# Patient Record
Sex: Female | Born: 1955
Health system: Southern US, Community
[De-identification: ages and names within clinical notes are randomized; demographics above are authoritative.]

## PROBLEM LIST (undated history)

## (undated) DIAGNOSIS — K5521 Angiodysplasia of colon with hemorrhage: Secondary | ICD-10-CM

## (undated) DIAGNOSIS — F329 Major depressive disorder, single episode, unspecified: Secondary | ICD-10-CM

## (undated) DIAGNOSIS — K589 Irritable bowel syndrome without diarrhea: Secondary | ICD-10-CM

## (undated) DIAGNOSIS — G43909 Migraine, unspecified, not intractable, without status migrainosus: Secondary | ICD-10-CM

## (undated) DIAGNOSIS — G250 Essential tremor: Secondary | ICD-10-CM

## (undated) DIAGNOSIS — R195 Other fecal abnormalities: Secondary | ICD-10-CM

## (undated) DIAGNOSIS — G4730 Sleep apnea, unspecified: Secondary | ICD-10-CM

## (undated) DIAGNOSIS — K635 Polyp of colon: Secondary | ICD-10-CM

## (undated) DIAGNOSIS — M1990 Unspecified osteoarthritis, unspecified site: Secondary | ICD-10-CM

## (undated) DIAGNOSIS — E039 Hypothyroidism, unspecified: Secondary | ICD-10-CM

## (undated) DIAGNOSIS — F419 Anxiety disorder, unspecified: Secondary | ICD-10-CM

## (undated) DIAGNOSIS — T7840XA Allergy, unspecified, initial encounter: Secondary | ICD-10-CM

## (undated) DIAGNOSIS — K219 Gastro-esophageal reflux disease without esophagitis: Secondary | ICD-10-CM

## (undated) DIAGNOSIS — R0902 Hypoxemia: Secondary | ICD-10-CM

## (undated) DIAGNOSIS — E785 Hyperlipidemia, unspecified: Secondary | ICD-10-CM

## (undated) DIAGNOSIS — G4700 Insomnia, unspecified: Secondary | ICD-10-CM

## (undated) DIAGNOSIS — R413 Other amnesia: Secondary | ICD-10-CM

## (undated) DIAGNOSIS — J449 Chronic obstructive pulmonary disease, unspecified: Secondary | ICD-10-CM

## (undated) DIAGNOSIS — N390 Urinary tract infection, site not specified: Secondary | ICD-10-CM

## (undated) DIAGNOSIS — F32A Depression, unspecified: Secondary | ICD-10-CM

## (undated) DIAGNOSIS — I10 Essential (primary) hypertension: Secondary | ICD-10-CM

## (undated) DIAGNOSIS — E669 Obesity, unspecified: Secondary | ICD-10-CM

## (undated) DIAGNOSIS — J439 Emphysema, unspecified: Secondary | ICD-10-CM

## (undated) HISTORY — DX: Insomnia, unspecified: G47.00

## (undated) HISTORY — DX: Allergy, unspecified, initial encounter: T78.40XA

## (undated) HISTORY — DX: Irritable bowel syndrome without diarrhea: K58.9

## (undated) HISTORY — DX: Major depressive disorder, single episode, unspecified: F32.9

## (undated) HISTORY — DX: Polyp of colon: K63.5

## (undated) HISTORY — DX: Hypoxemia: R09.02

## (undated) HISTORY — DX: Chronic obstructive pulmonary disease, unspecified: J44.9

## (undated) HISTORY — DX: Urinary tract infection, site not specified: N39.0

## (undated) HISTORY — DX: Angiodysplasia of colon with hemorrhage: K55.21

## (undated) HISTORY — DX: Other fecal abnormalities: R19.5

## (undated) HISTORY — DX: Other amnesia: R41.3

## (undated) HISTORY — DX: Sleep apnea, unspecified: G47.30

## (undated) HISTORY — PX: OTHER SURGICAL HISTORY: SHX169

## (undated) HISTORY — PX: ANKLE FRACTURE SURGERY: SHX122

## (undated) HISTORY — DX: Essential tremor: G25.0

## (undated) HISTORY — DX: Anxiety disorder, unspecified: F41.9

## (undated) HISTORY — DX: Emphysema, unspecified: J43.9

## (undated) HISTORY — DX: Depression, unspecified: F32.A

## (undated) HISTORY — DX: Migraine, unspecified, not intractable, without status migrainosus: G43.909

## (undated) HISTORY — PX: EYE SURGERY: SHX253

## (undated) HISTORY — DX: Hyperlipidemia, unspecified: E78.5

## (undated) HISTORY — DX: Gastro-esophageal reflux disease without esophagitis: K21.9

## (undated) HISTORY — DX: Essential (primary) hypertension: I10

## (undated) HISTORY — DX: Obesity, unspecified: E66.9

---

## 1999-04-30 ENCOUNTER — Ambulatory Visit (HOSPITAL_COMMUNITY): Admission: RE | Admit: 1999-04-30 | Discharge: 1999-04-30 | Payer: Self-pay | Admitting: Family Medicine

## 1999-04-30 ENCOUNTER — Encounter: Payer: Self-pay | Admitting: Family Medicine

## 1999-12-02 HISTORY — PX: WRIST ARTHROCENTESIS: SUR48

## 2000-02-09 ENCOUNTER — Encounter: Payer: Self-pay | Admitting: Emergency Medicine

## 2000-02-09 ENCOUNTER — Emergency Department (HOSPITAL_COMMUNITY): Admission: EM | Admit: 2000-02-09 | Discharge: 2000-02-09 | Payer: Self-pay | Admitting: Emergency Medicine

## 2000-09-21 ENCOUNTER — Encounter: Payer: Self-pay | Admitting: Family Medicine

## 2000-09-21 ENCOUNTER — Encounter: Admission: RE | Admit: 2000-09-21 | Discharge: 2000-09-21 | Payer: Self-pay | Admitting: Family Medicine

## 2001-07-07 ENCOUNTER — Other Ambulatory Visit: Admission: RE | Admit: 2001-07-07 | Discharge: 2001-07-07 | Payer: Self-pay | Admitting: Obstetrics and Gynecology

## 2002-09-26 ENCOUNTER — Other Ambulatory Visit: Admission: RE | Admit: 2002-09-26 | Discharge: 2002-09-26 | Payer: Self-pay | Admitting: Obstetrics and Gynecology

## 2005-04-11 ENCOUNTER — Ambulatory Visit: Payer: Self-pay | Admitting: Family Medicine

## 2005-04-16 ENCOUNTER — Ambulatory Visit: Payer: Self-pay | Admitting: Family Medicine

## 2005-08-01 ENCOUNTER — Ambulatory Visit: Payer: Self-pay | Admitting: Family Medicine

## 2005-08-03 ENCOUNTER — Encounter: Admission: RE | Admit: 2005-08-03 | Discharge: 2005-08-03 | Payer: Self-pay | Admitting: Family Medicine

## 2006-02-24 ENCOUNTER — Ambulatory Visit: Payer: Self-pay | Admitting: Family Medicine

## 2006-10-13 ENCOUNTER — Ambulatory Visit: Payer: Self-pay | Admitting: Family Medicine

## 2007-05-16 ENCOUNTER — Emergency Department (HOSPITAL_COMMUNITY): Admission: EM | Admit: 2007-05-16 | Discharge: 2007-05-16 | Payer: Self-pay | Admitting: Emergency Medicine

## 2007-05-24 ENCOUNTER — Ambulatory Visit: Payer: Self-pay | Admitting: Family Medicine

## 2007-09-01 ENCOUNTER — Telehealth: Payer: Self-pay | Admitting: Family Medicine

## 2007-11-03 ENCOUNTER — Ambulatory Visit: Payer: Self-pay | Admitting: Family Medicine

## 2007-11-03 DIAGNOSIS — R1013 Epigastric pain: Secondary | ICD-10-CM | POA: Insufficient documentation

## 2007-11-03 DIAGNOSIS — K219 Gastro-esophageal reflux disease without esophagitis: Secondary | ICD-10-CM

## 2007-11-03 DIAGNOSIS — R519 Headache, unspecified: Secondary | ICD-10-CM

## 2007-11-03 DIAGNOSIS — R51 Headache: Secondary | ICD-10-CM

## 2007-11-03 HISTORY — DX: Headache, unspecified: R51.9

## 2007-11-03 HISTORY — DX: Gastro-esophageal reflux disease without esophagitis: K21.9

## 2007-11-12 ENCOUNTER — Telehealth (INDEPENDENT_AMBULATORY_CARE_PROVIDER_SITE_OTHER): Payer: Self-pay | Admitting: *Deleted

## 2007-11-30 ENCOUNTER — Encounter: Payer: Self-pay | Admitting: Family Medicine

## 2008-03-16 ENCOUNTER — Telehealth: Payer: Self-pay | Admitting: Family Medicine

## 2008-07-31 ENCOUNTER — Telehealth: Payer: Self-pay | Admitting: Family Medicine

## 2008-10-05 ENCOUNTER — Telehealth: Payer: Self-pay | Admitting: Family Medicine

## 2008-10-16 ENCOUNTER — Telehealth: Payer: Self-pay | Admitting: Family Medicine

## 2009-03-03 ENCOUNTER — Ambulatory Visit: Payer: Self-pay | Admitting: Family Medicine

## 2009-03-03 DIAGNOSIS — F17200 Nicotine dependence, unspecified, uncomplicated: Secondary | ICD-10-CM | POA: Insufficient documentation

## 2009-03-26 ENCOUNTER — Ambulatory Visit: Payer: Self-pay | Admitting: Family Medicine

## 2009-03-28 ENCOUNTER — Telehealth: Payer: Self-pay | Admitting: Family Medicine

## 2009-04-19 ENCOUNTER — Telehealth: Payer: Self-pay | Admitting: Family Medicine

## 2009-05-07 ENCOUNTER — Telehealth: Payer: Self-pay | Admitting: Family Medicine

## 2009-06-12 ENCOUNTER — Telehealth: Payer: Self-pay | Admitting: Family Medicine

## 2009-07-13 ENCOUNTER — Ambulatory Visit: Payer: Self-pay | Admitting: Family Medicine

## 2009-07-13 DIAGNOSIS — K589 Irritable bowel syndrome without diarrhea: Secondary | ICD-10-CM | POA: Insufficient documentation

## 2009-07-13 HISTORY — DX: Irritable bowel syndrome, unspecified: K58.9

## 2009-07-16 ENCOUNTER — Ambulatory Visit: Payer: Self-pay | Admitting: Family Medicine

## 2009-07-16 LAB — CONVERTED CEMR LAB
Bilirubin Urine: NEGATIVE
Blood in Urine, dipstick: NEGATIVE
Glucose, Urine, Semiquant: NEGATIVE
Ketones, urine, test strip: NEGATIVE
Nitrite: NEGATIVE
Protein, U semiquant: NEGATIVE
Specific Gravity, Urine: 1.025
Urobilinogen, UA: 0.2
pH: 5.5

## 2009-07-19 LAB — CONVERTED CEMR LAB
ALT: 105 units/L — ABNORMAL HIGH (ref 0–35)
AST: 78 units/L — ABNORMAL HIGH (ref 0–37)
Albumin: 4.2 g/dL (ref 3.5–5.2)
Alkaline Phosphatase: 91 units/L (ref 39–117)
BUN: 13 mg/dL (ref 6–23)
Basophils Absolute: 0 10*3/uL (ref 0.0–0.1)
Basophils Relative: 0.5 % (ref 0.0–3.0)
Bilirubin, Direct: 0 mg/dL (ref 0.0–0.3)
CO2: 28 meq/L (ref 19–32)
Calcium: 10 mg/dL (ref 8.4–10.5)
Chloride: 99 meq/L (ref 96–112)
Cholesterol: 264 mg/dL — ABNORMAL HIGH (ref 0–200)
Creatinine, Ser: 0.6 mg/dL (ref 0.4–1.2)
Direct LDL: 199.1 mg/dL
Eosinophils Absolute: 0.4 10*3/uL (ref 0.0–0.7)
Eosinophils Relative: 4.3 % (ref 0.0–5.0)
GFR calc non Af Amer: 111.05 mL/min (ref >60)
Glucose, Bld: 279 mg/dL — ABNORMAL HIGH (ref 70–99)
HCT: 41.1 % (ref 36.0–46.0)
HDL: 28.8 mg/dL — ABNORMAL LOW (ref >39.00)
Hemoglobin: 13.8 g/dL (ref 12.0–15.0)
Lymphocytes Relative: 34.1 % (ref 12.0–46.0)
Lymphs Abs: 3.4 10*3/uL (ref 0.7–4.0)
MCHC: 33.5 g/dL (ref 30.0–36.0)
MCV: 92.5 fL (ref 78.0–100.0)
Monocytes Absolute: 0.6 10*3/uL (ref 0.1–1.0)
Monocytes Relative: 5.8 % (ref 3.0–12.0)
Neutro Abs: 5.5 10*3/uL (ref 1.4–7.7)
Neutrophils Relative %: 55.3 % (ref 43.0–77.0)
Platelets: 253 10*3/uL (ref 150.0–400.0)
Potassium: 4.3 meq/L (ref 3.5–5.1)
RBC: 4.44 M/uL (ref 3.87–5.11)
RDW: 12.2 % (ref 11.5–14.6)
Sodium: 135 meq/L (ref 135–145)
TSH: 2.89 microintl units/mL (ref 0.35–5.50)
Total Bilirubin: 0.7 mg/dL (ref 0.3–1.2)
Total CHOL/HDL Ratio: 9
Total Protein: 7.9 g/dL (ref 6.0–8.3)
Triglycerides: 247 mg/dL — ABNORMAL HIGH (ref 0.0–149.0)
VLDL: 49.4 mg/dL — ABNORMAL HIGH (ref 0.0–40.0)
WBC: 9.9 10*3/uL (ref 4.5–10.5)

## 2009-07-20 ENCOUNTER — Ambulatory Visit: Payer: Self-pay | Admitting: Family Medicine

## 2009-07-20 DIAGNOSIS — E782 Mixed hyperlipidemia: Secondary | ICD-10-CM

## 2009-07-20 DIAGNOSIS — J309 Allergic rhinitis, unspecified: Secondary | ICD-10-CM

## 2009-07-20 HISTORY — DX: Allergic rhinitis, unspecified: J30.9

## 2009-07-20 HISTORY — DX: Mixed hyperlipidemia: E78.2

## 2009-08-03 ENCOUNTER — Ambulatory Visit: Payer: Self-pay | Admitting: Family Medicine

## 2009-09-24 ENCOUNTER — Ambulatory Visit: Payer: Self-pay | Admitting: Family Medicine

## 2009-11-16 ENCOUNTER — Encounter: Payer: Self-pay | Admitting: Family Medicine

## 2009-11-16 ENCOUNTER — Telehealth: Payer: Self-pay | Admitting: Family Medicine

## 2009-11-19 ENCOUNTER — Telehealth: Payer: Self-pay | Admitting: Family Medicine

## 2009-11-19 ENCOUNTER — Ambulatory Visit: Payer: Self-pay | Admitting: Family Medicine

## 2010-01-30 ENCOUNTER — Telehealth: Payer: Self-pay | Admitting: Family Medicine

## 2010-04-08 ENCOUNTER — Telehealth: Payer: Self-pay | Admitting: Family Medicine

## 2010-05-20 ENCOUNTER — Telehealth: Payer: Self-pay | Admitting: Family Medicine

## 2010-05-29 ENCOUNTER — Ambulatory Visit: Payer: Self-pay | Admitting: Family Medicine

## 2010-08-15 ENCOUNTER — Telehealth: Payer: Self-pay | Admitting: Family Medicine

## 2010-09-18 ENCOUNTER — Ambulatory Visit: Payer: Self-pay | Admitting: Family Medicine

## 2010-09-18 LAB — CONVERTED CEMR LAB
Bilirubin Urine: NEGATIVE
Blood in Urine, dipstick: NEGATIVE
Glucose, Urine, Semiquant: NEGATIVE
Ketones, urine, test strip: NEGATIVE
Nitrite: NEGATIVE
Protein, U semiquant: NEGATIVE
Specific Gravity, Urine: 1.025
Urobilinogen, UA: 0.2
pH: 5

## 2010-09-20 LAB — CONVERTED CEMR LAB
ALT: 82 units/L — ABNORMAL HIGH (ref 0–35)
Albumin: 4.1 g/dL (ref 3.5–5.2)
BUN: 12 mg/dL (ref 6–23)
Bilirubin, Direct: 0.1 mg/dL (ref 0.0–0.3)
Calcium: 9.8 mg/dL (ref 8.4–10.5)
Cholesterol: 217 mg/dL — ABNORMAL HIGH (ref 0–200)
Creatinine, Ser: 0.7 mg/dL (ref 0.4–1.2)
Direct LDL: 146.2 mg/dL
GFR calc non Af Amer: 99.05 mL/min (ref >60)
Glucose, Bld: 207 mg/dL — ABNORMAL HIGH (ref 70–99)
HDL: 35.8 mg/dL — ABNORMAL LOW (ref >39.00)
Hgb A1c MFr Bld: 8.5 % — ABNORMAL HIGH (ref 4.6–6.5)
Lymphocytes Relative: 29.6 % (ref 12.0–46.0)
MCHC: 33.3 g/dL (ref 30.0–36.0)
Monocytes Absolute: 0.6 10*3/uL (ref 0.1–1.0)
Monocytes Relative: 5.5 % (ref 3.0–12.0)
Platelets: 248 10*3/uL (ref 150.0–400.0)
RDW: 14.6 % (ref 11.5–14.6)
Total Protein: 7.3 g/dL (ref 6.0–8.3)
Triglycerides: 263 mg/dL — ABNORMAL HIGH (ref 0.0–149.0)

## 2010-10-01 ENCOUNTER — Other Ambulatory Visit: Admission: RE | Admit: 2010-10-01 | Discharge: 2010-10-01 | Payer: Self-pay | Admitting: Family Medicine

## 2010-10-01 ENCOUNTER — Encounter: Payer: Self-pay | Admitting: Family Medicine

## 2010-10-01 ENCOUNTER — Ambulatory Visit: Payer: Self-pay | Admitting: Family Medicine

## 2010-10-01 DIAGNOSIS — E039 Hypothyroidism, unspecified: Secondary | ICD-10-CM | POA: Insufficient documentation

## 2010-10-01 HISTORY — DX: Hypothyroidism, unspecified: E03.9

## 2010-10-01 LAB — HM PAP SMEAR

## 2010-10-07 ENCOUNTER — Encounter: Payer: Self-pay | Admitting: Family Medicine

## 2010-10-09 ENCOUNTER — Ambulatory Visit: Payer: Self-pay | Admitting: Family Medicine

## 2010-10-18 ENCOUNTER — Telehealth: Payer: Self-pay | Admitting: Family Medicine

## 2010-12-03 ENCOUNTER — Telehealth: Payer: Self-pay | Admitting: Family Medicine

## 2010-12-11 ENCOUNTER — Telehealth: Payer: Self-pay | Admitting: Family Medicine

## 2010-12-13 ENCOUNTER — Ambulatory Visit
Admission: RE | Admit: 2010-12-13 | Discharge: 2010-12-13 | Payer: Self-pay | Source: Home / Self Care | Attending: Family Medicine | Admitting: Family Medicine

## 2010-12-13 DIAGNOSIS — L301 Dyshidrosis [pompholyx]: Secondary | ICD-10-CM

## 2010-12-13 HISTORY — DX: Dyshidrosis (pompholyx): L30.1

## 2011-01-02 NOTE — Assessment & Plan Note (Signed)
Summary: dry skin and cracking?/dm   Vital Signs:  Patient profile:   55 year old female Weight:      188 pounds O2 Sat:      95 % Temp:     98.2 degrees F Pulse rate:   95 / minute BP sitting:   126 / 84  (left arm) Cuff size:   regular  Vitals Entered By: Pura Spice, RN (December 13, 2010 3:53 PM) CC: hand and feet peeling cracking . states better today ck place on left foot not healing Is Patient Diabetic? Yes Did you bring your meter with you today? No   History of Present Illness: Here for dry,  cracking,  irritated skin on the hands and feet for 2 months. Tried antifungal creams with no help.   Allergies: 1)  ! Vicodin 2)  ! Codeine 3)  ! Penicillin 4)  ! Prozac 5)  ! Percocet 6)  ! Tramadol Hcl  Past History:  Past Medical History: Reviewed history from 10/01/2010 and no changes required. Depression GERD Headache IBS Anxiety insomnia Allergic rhinitis Diabetes mellitus, type II Hypothyroidism  Review of Systems  The patient denies anorexia, fever, weight loss, weight gain, vision loss, decreased hearing, hoarseness, chest pain, syncope, dyspnea on exertion, peripheral edema, prolonged cough, headaches, hemoptysis, abdominal pain, melena, hematochezia, severe indigestion/heartburn, hematuria, incontinence, genital sores, muscle weakness, suspicious skin lesions, transient blindness, difficulty walking, depression, unusual weight change, abnormal bleeding, enlarged lymph nodes, angioedema, breast masses, and testicular masses.    Physical Exam  General:  Well-developed,well-nourished,in no acute distress; alert,appropriate and cooperative throughout examination Skin:  the hands and feet have widespread areas of macular erythema, scaling, and have some fissuring.    Impression & Recommendations:  Problem # 1:  DYSHIDROTIC ECZEMA (ICD-705.81)  Complete Medication List: 1)  Nexium 40 Mg Cpdr (Esomeprazole magnesium) .Marland Kitchen.. 1 by mouth once daily 2)   Sertraline Hcl 50 Mg Tabs (Sertraline hcl) .Marland Kitchen.. 1 by mouth once daily 3)  Xanax 0.5 Mg Tabs (Alprazolam) .... Three times a day as needed 4)  Temazepam 30 Mg Caps (Temazepam) .Marland Kitchen.. 1 at bedtime prn 5)  Hydroxyzine Hcl 25 Mg Tabs (Hydroxyzine hcl) .Marland Kitchen.. 1 by mouth every 4 hours as needed for itching 6)  Triamcinolone Acetonide 0.1 % Crea (Triamcinolone acetonide) .... Apply bid to affected area as needed 7)  Miralax Powd (Polyethylene glycol 3350) .Marland Kitchen.. 17g by mouth once daily as needed constipation 8)  Ibuprofen 600 Mg Tabs (Ibuprofen) .... As needed 9)  Align Caps (Probiotic product) .... Once daily 10)  Glyburide-metformin 5-500 Mg Tabs (Glyburide-metformin) .... 2 tabs two times a day 11)  Zocor 40 Mg Tabs (Simvastatin) .... Once daily 12)  Tessalon Perles 100 Mg Caps (Benzonatate) .Marland Kitchen.. 1 q 4 hours as needed cough 13)  Januvia 100 Mg Tabs (Sitagliptin phosphate) .... Once daily 14)  Ra Truetest Test Strp (Glucose blood) .... Uad 15)  Synthroid 50 Mcg Tabs (Levothyroxine sodium) .Marland Kitchen.. 1 by mouth once daily 16)  Ultravate 0.05 % Crea (Halobetasol propionate) .... Apply three times a day as needed  Patient Instructions: 1)  try Halobetasol cream.  2)  Please schedule a follow-up appointment as needed .  Prescriptions: ULTRAVATE 0.05 % CREA (HALOBETASOL PROPIONATE) apply three times a day as needed  #60 x 5   Entered and Authorized by:   Nelwyn Salisbury MD   Signed by:   Nelwyn Salisbury MD on 12/13/2010   Method used:   [facility] [id number]       [address]       [place], [place]  [address]       Ph: [phone] or [phone]       Fax: [phone]   RxID:   [id number]    Orders Added: 1)  Est. Patient Level III [38756]

## 2011-01-02 NOTE — Progress Notes (Signed)
Summary: Pt req work in ov with Dr. Clent Ridges on 12/12/10  Phone Note Call from Patient Call back at Home Phone [phone]   Caller: Patient Summary of Call: Pt called and said that the skin on her hand are peeling and can not get it to heal. Pt req work in appt with Dr. Clent Ridges on 12/12/10. Pls advise.  Initial call taken by: Lucy Antigua,  December 11, 2010 3:36 PM  Follow-up for Phone Call        use a good moisturizer like Eucerin today, and I can see her tomorrow  Follow-up by: Nelwyn Salisbury MD,  December 12, 2010 10:55 AM  Additional Follow-up for Phone Call Additional follow up Details #1::        Pt notified and appt scheduled. Additional Follow-up by: Lynann Beaver CMA AAMA,  December 12, 2010 1:28 PM

## 2011-01-02 NOTE — Assessment & Plan Note (Signed)
Summary: HYPERGLYCEMIA CONCERNS // RS   Vital Signs:  Patient profile:   55 year old female Weight:      189 pounds BMI:     37.04 BP sitting:   122 / 70  (left arm) Cuff size:   regular  Vitals Entered By: Raechel Ache, RN (Leilani 29, 2011 4:38 PM) CC: C/o high BS's and feet hurt and stay cold. Has 3 skin tags.   History of Present Illness: Here with concern over elevated glucose readings at home over the past 6 months. Despite exercising and watching her diet closely, her morning fasting glucoses are running from 150-200. She takes her meds regularly. She feels good in general.   Allergies: 1)  ! Vicodin 2)  ! Codeine 3)  ! Penicillin 4)  ! Prozac 5)  ! Percocet 6)  ! Tramadol Hcl  Past History:  Past Medical History: Depression GERD Headache IBS Anxiety insomnia Allergic rhinitis sees Debbora Dus NP for GYN exams Diabetes mellitus, type II  Past Surgical History: Reviewed history from 11/03/2007 and no changes required. Benign tumor removed from groin  Review of Systems  The patient denies anorexia, fever, weight loss, weight gain, vision loss, decreased hearing, hoarseness, chest pain, syncope, dyspnea on exertion, peripheral edema, prolonged cough, headaches, hemoptysis, abdominal pain, melena, hematochezia, severe indigestion/heartburn, hematuria, incontinence, genital sores, muscle weakness, suspicious skin lesions, transient blindness, difficulty walking, depression, unusual weight change, abnormal bleeding, enlarged lymph nodes, angioedema, breast masses, and testicular masses.    Physical Exam  General:  overweight-appearing.   Neck:  No deformities, masses, or tenderness noted. Lungs:  Normal respiratory effort, chest expands symmetrically. Lungs are clear to auscultation, no crackles or wheezes. Heart:  Normal rate and regular rhythm. S1 and S2 normal without gallop, murmur, click, rub or other extra sounds.   Impression & Recommendations:  Problem #  1:  DIABETES MELLITUS, TYPE II (ICD-250.00)  Her updated medication list for this problem includes:    Glyburide-metformin 5-500 Mg Tabs (Glyburide-metformin) .Marland Kitchen... 2 tabs two times a day    Januvia 100 Mg Tabs (Sitagliptin phosphate) ..... Once daily  Problem # 2:  HYPERLIPIDEMIA, MIXED (ICD-272.2)  Her updated medication list for this problem includes:    Zocor 40 Mg Tabs (Simvastatin) ..... Once daily  Complete Medication List: 1)  Nexium 40 Mg Cpdr (Esomeprazole magnesium) .Marland Kitchen.. 1 by mouth once daily 2)  Sertraline Hcl 50 Mg Tabs (Sertraline hcl) .Marland Kitchen.. 1 by mouth once daily 3)  Xanax 0.5 Mg Tabs (Alprazolam) .... Three times a day as needed 4)  Temazepam 30 Mg Caps (Temazepam) .Marland Kitchen.. 1 at bedtime prn 5)  Hydroxyzine Hcl 25 Mg Tabs (Hydroxyzine hcl) .Marland Kitchen.. 1 by mouth every 4 hours as needed for itching 6)  Triamcinolone Acetonide 0.1 % Crea (Triamcinolone acetonide) .... Apply bid to affected area as needed 7)  Miralax Powd (Polyethylene glycol 3350) .Marland Kitchen.. 17g by mouth once daily as needed constipation 8)  Ibuprofen 600 Mg Tabs (Ibuprofen) .... As needed 9)  Align Caps (Probiotic product) .... Once daily 10)  Glyburide-metformin 5-500 Mg Tabs (Glyburide-metformin) .... 2 tabs two times a day 11)  Zocor 40 Mg Tabs (Simvastatin) .... Once daily 12)  Tessalon Perles 100 Mg Caps (Benzonatate) .Marland Kitchen.. 1 q 4 hours as needed cough 13)  Januvia 100 Mg Tabs (Sitagliptin phosphate) .... Once daily  Patient Instructions: 1)  Add Januvia to her daily regimen. Recheck in 2 months with fasting labs and a cpx.

## 2011-01-02 NOTE — Progress Notes (Signed)
Summary: cold rx  Phone Note Call from Patient   Caller: Patient Call For: Nelwyn Salisbury MD Reason for Call: Talk to Doctor Summary of Call: patient is calling because she has a cold- productive- green.  She would like [facility].  She also says that z-pak does not work for her. Initial call taken by: Kern Reap CMA Duncan Dull),  December 03, 2010 10:43 AM  Follow-up for Phone Call        pt aware.  med called  Follow-up by: Pura Spice, RN,  December 03, 2010 1:41 PM    New/Updated Medications: DOXYCYCLINE HYCLATE 100 MG CAPS (DOXYCYCLINE HYCLATE) Take 1 tab twice a day Prescriptions: DOXYCYCLINE HYCLATE 100 MG CAPS (DOXYCYCLINE HYCLATE) Take 1 tab twice a day  #20 x 0   Entered by:   Pura Spice, RN   Authorized by:   Nelwyn Salisbury MD   Signed by:   Pura Spice, RN on 12/03/2010   Method used:   [facility]        [facility]  [place] [facility]       [address]       [place], [place]  [address]       Ph: [phone] or [phone]       Fax: [phone]   RxID:   [id number]   Appended Document: cold rx per dr Alfonzo Feller call in doxycycline 100mg  #20  1 by mouth two times a day

## 2011-01-02 NOTE — Progress Notes (Signed)
Summary: refill doxycycline  Phone Note From Pharmacy   [phone][facility] Call For: Bonnie Gonzalez  Summary of Call: pt wants a refill on Doxycycline 100mg  1 by mouth two times a day Initial call taken by: Alfred Levins, CMA,  January 30, 2010 4:10 PM  Follow-up for Phone Call        call in another 10 day supply Follow-up by: Nelwyn Salisbury MD,  January 30, 2010 5:22 PM  Additional Follow-up for Phone Call Additional follow up Details #1::        Phone call completed, Pharmacist called Additional Follow-up by: Alfred Levins, CMA,  January 30, 2010 5:23 PM    New/Updated Medications: DOXYCYCLINE HYCLATE 100 MG CAPS (DOXYCYCLINE HYCLATE) Take 1 tab twice a day Prescriptions: DOXYCYCLINE HYCLATE 100 MG CAPS (DOXYCYCLINE HYCLATE) Take 1 tab twice a day  #20 x 0   Entered by:   Alfred Levins, CMA   Authorized by:   Nelwyn Salisbury MD   Signed by:   Alfred Levins, CMA on 01/30/2010   Method used:   [facility] to        [id number] (retail[facility]       [address] [place], [place]  [address]       Ph: [phone]       Fax: [phone]   RxID:   [id number]

## 2011-01-02 NOTE — Miscellaneous (Signed)
Summary: Rremoval of skin tags/[facility]  Rremoval of skin tags/[facility]   Imported By: Sherian Rein 10/15/2010 11:57:38  _____________________________________________________________________  External Attachment:    Type:   Image     Comment:   External Document

## 2011-01-02 NOTE — Letter (Signed)
Summary: Results Follow-up Letter  [place] at [facility]  [address] [place], [place] [address]   Phone: [phone]  Fax: [phone]    10/07/2010  [address], [address] [place], [place]  [address]  Dear Ms. Debell,   The following are the results of your recent test(s):  Test     Result     Pap Smear    Normal____ YES REPEAT 1 YR ___   _______________________________________________________  Dr Almon Hercules, [place] at [facility]

## 2011-01-02 NOTE — Progress Notes (Signed)
Summary: refill Temazepam  Phone Note Refill Request Message from:  Fax from Pharmacy on Apr 08, 2010 1:16 PM  Refills Requested: Medication #1:  TEMAZEPAM 30 MG CAPS 1 at bedtime prn   Dosage confirmed as above?Dosage Confirmed   Supply Requested: 1 month   Last Refilled: 12/01/2009  Method Requested: Telephone to Pharmacy Initial call taken by: Raechel Ache, RN,  Apr 08, 2010 1:17 PM [phone]  Follow-up for Phone Call        call in #30 with 5 rf Follow-up by: Nelwyn Salisbury MD,  Apr 08, 2010 2:23 PM  Additional Follow-up for Phone Call Additional follow up Details #1::        Rx faxed to pharmacy Additional Follow-up by: Raechel Ache, RN,  Apr 08, 2010 2:45 PM    Prescriptions: TEMAZEPAM 30 MG CAPS (TEMAZEPAM) 1 at bedtime prn  #30 x 5   Entered by:   Raechel Ache, RN   Authorized by:   Nelwyn Salisbury MD   Signed by:   Raechel Ache, RN on 04/08/2010   Method used:   Historical   RxID:   [id number]

## 2011-01-02 NOTE — Assessment & Plan Note (Signed)
Summary: cpx/pt req skin tag removal/cjr   Vital Signs:  Patient profile:   55 year old female Height:      60 inches Weight:      188 pounds O2 Sat:      97 % Temp:     98.4 degrees F Pulse rate:   90 / minute BP sitting:   130 / 86  (left arm)  Vitals Entered By: Pura Spice, RN (October 01, 2010 1:50 PM) CC: cpx w/pap c/o BS up and fatigue    History of Present Illness: 55 yr old female for a cpx. She feels well but admits to getting no exercise and eating poorly. Her glucoses range in the 100s and 200s. Her last Pap smear  and mammogram were 3 years ago.   Allergies: 1)  ! Vicodin 2)  ! Codeine 3)  ! Penicillin 4)  ! Prozac 5)  ! Percocet 6)  ! Tramadol Hcl  Past History:  Past Medical History: Depression GERD Headache IBS Anxiety insomnia Allergic rhinitis Diabetes mellitus, type II Hypothyroidism  Past Surgical History: Reviewed history from 11/03/2007 and no changes required. Benign tumor removed from groin  Family History: Reviewed history from 11/03/2007 and no changes required. Family History of Asthma Family History Depression Family History Diabetes 1st degree relative Parkinson's diease Lupus  Social History: Reviewed history from 11/03/2007 and no changes required. Divorced Current Smoker Alcohol use-yes Drug use-yes  Review of Systems  The patient denies anorexia, fever, weight loss, vision loss, decreased hearing, hoarseness, chest pain, syncope, dyspnea on exertion, peripheral edema, prolonged cough, headaches, hemoptysis, abdominal pain, melena, hematochezia, severe indigestion/heartburn, hematuria, incontinence, genital sores, muscle weakness, suspicious skin lesions, transient blindness, difficulty walking, depression, unusual weight change, abnormal bleeding, enlarged lymph nodes, angioedema, breast masses, and testicular masses.    Physical Exam  General:  overweight-appearing.   Head:  Normocephalic and atraumatic without  obvious abnormalities. No apparent alopecia or balding. Eyes:  No corneal or conjunctival inflammation noted. EOMI. Perrla. Funduscopic exam benign, without hemorrhages, exudates or papilledema. Vision grossly normal. Ears:  External ear exam shows no significant lesions or deformities.  Otoscopic examination reveals clear canals, tympanic membranes are intact bilaterally without bulging, retraction, inflammation or discharge. Hearing is grossly normal bilaterally. Nose:  External nasal examination shows no deformity or inflammation. Nasal mucosa are pink and moist without lesions or exudates. Mouth:  Oral mucosa and oropharynx without lesions or exudates.  Teeth in good repair. Neck:  No deformities, masses, or tenderness noted. Chest Wall:  No deformities, masses, or tenderness noted. Breasts:  No mass, nodules, thickening, tenderness, bulging, retraction, inflamation, nipple discharge or skin changes noted.   Lungs:  Normal respiratory effort, chest expands symmetrically. Lungs are clear to auscultation, no crackles or wheezes. Heart:  Normal rate and regular rhythm. S1 and S2 normal without gallop, murmur, click, rub or other extra sounds. EKG normal Abdomen:  Bowel sounds positive,abdomen soft and non-tender without masses, organomegaly or hernias noted. Rectal:  No external abnormalities noted. Normal sphincter tone. No rectal masses or tenderness. Heme neg. Genitalia:  Pelvic Exam:        External: normal female genitalia without lesions or masses        Vagina: normal without lesions or masses        Cervix: normal without lesions or masses        Adnexa: normal bimanual exam without masses or fullness        Uterus: normal by palpation  Pap smear: performed Msk:  No deformity or scoliosis noted of thoracic or lumbar spine.   Pulses:  R and L carotid,radial,femoral,dorsalis pedis and posterior tibial pulses are full and equal bilaterally Extremities:  No clubbing, cyanosis, edema,  or deformity noted with normal full range of motion of all joints.   Neurologic:  No cranial nerve deficits noted. Station and gait are normal. Plantar reflexes are down-going bilaterally. DTRs are symmetrical throughout. Sensory, motor and coordinative functions appear intact. Skin:  Intact without suspicious lesions or rashes Cervical Nodes:  No lymphadenopathy noted Axillary Nodes:  No palpable lymphadenopathy Inguinal Nodes:  No significant adenopathy Psych:  Cognition and judgment appear intact. Alert and cooperative with normal attention span and concentration. No apparent delusions, illusions, hallucinations   Impression & Recommendations:  Problem # 1:  HEALTH MAINTENANCE EXAM (ICD-V70.0)  Orders: Hemoccult Guaiac-1 spec.(in office) (82270) EKG w/ Interpretation (93000)  Complete Medication List: 1)  Nexium 40 Mg Cpdr (Esomeprazole magnesium) .Marland Kitchen.. 1 by mouth once daily 2)  Sertraline Hcl 50 Mg Tabs (Sertraline hcl) .Marland Kitchen.. 1 by mouth once daily 3)  Xanax 0.5 Mg Tabs (Alprazolam) .... Three times a day as needed 4)  Temazepam 30 Mg Caps (Temazepam) .Marland Kitchen.. 1 at bedtime prn 5)  Hydroxyzine Hcl 25 Mg Tabs (Hydroxyzine hcl) .Marland Kitchen.. 1 by mouth every 4 hours as needed for itching 6)  Triamcinolone Acetonide 0.1 % Crea (Triamcinolone acetonide) .... Apply bid to affected area as needed 7)  Miralax Powd (Polyethylene glycol 3350) .Marland Kitchen.. 17g by mouth once daily as needed constipation 8)  Ibuprofen 600 Mg Tabs (Ibuprofen) .... As needed 9)  Align Caps (Probiotic product) .... Once daily 10)  Glyburide-metformin 5-500 Mg Tabs (Glyburide-metformin) .... 2 tabs two times a day 11)  Zocor 40 Mg Tabs (Simvastatin) .... Once daily 12)  Tessalon Perles 100 Mg Caps (Benzonatate) .Marland Kitchen.. 1 q 4 hours as needed cough 13)  Januvia 100 Mg Tabs (Sitagliptin phosphate) .... Once daily 14)  Ra Truetest Test Strp (Glucose blood) .... Uad 15)  Synthroid 50 Mcg Tabs (Levothyroxine sodium) .Marland Kitchen.. 1 by mouth once  daily  Other Orders: Tdap => 64yrs IM ([address]) Admin 1st Vaccine ([address]454) Diabetic Clinic Referral (Diabetic)  Patient Instructions: 1)  It is important that you exercise reguarly at least 20 minutes 5 times a week. If you develop chest pain, have severe difficulty breathing, or feel very tired, stop exercising immediately and seek medical attention.  2)  You need to lose weight. Consider a lower calorie diet and regular exercise. Refer to Nutrition  3)  Schedule your mammogram.  4)  Please schedule a follow-up appointment in 3 months .  Prescriptions: ZOCOR 40 MG TABS (SIMVASTATIN) once daily  #30 x 11   Entered and Authorized by:   Nelwyn Salisbury MD   Signed by:   Nelwyn Salisbury MD on 10/01/2010   Method used:   [facility]ronically to        [facility]  [place] (retail)       [address]       [place], [place]  [address]       Ph: [phone] or [phone]       Fax: [phone]   RxID:   [id number] GLYBURIDE-METFORMIN 5-500 MG TABS (GLYBURIDE-METFORMIN) 2 tabs two times a day  #120 x 11   Entered and Authorized by:   Nelwyn Salisbury MD   Signed by:   Nelwyn Salisbury MD on 10/01/2010   Method used:  [facility]ically to        [facility]  [place] (retail)       [address]       [place], [place]  [address]       Ph: [phone] or [phone]       Fax: [phone]   RxID:   [id number] SERTRALINE HCL 50 MG TABS (SERTRALINE HCL) 1 by mouth once daily  #30 x 11   Entered and Authorized by:   Nelwyn Salisbury MD   Signed by:   Nelwyn Salisbury MD on 10/01/2010   Method used:   [facility]        [facility]  [place] [id number]ail[facility]       [address]       [place], [place]  [address]       Ph: [phone] or [phone]       Fax: [phone]   RxID:   [id number]    Orders Added: 1)  Tdap => 22yrs IM [[address]15] 2)  Admin 1st Vaccine [90471] 3)  Est. Patient 40-64 years [[address]] 4)  Hemoccult Guaiac-1 spec.(in office) [82270] 5)  EKG w/  Interpretation [93000] 6)  Diabetic Clinic Referral [Diabetic]   Immunizations Administered:  Tetanus Vaccine:    Vaccine Type: Tdap    Site: left deltoid    Mfr: GlaxoSmithKline    Dose: 0.5 ml    Route: IM    Given by: Pura Spice, RN    Exp. Date: 09/19/2012    Lot #: [id number]    VIS given: 10/18/08 version given October 01, 2010.   Immunizations Administered:  Tetanus Vaccine:    Vaccine Type: Tdap    Site: left deltoid    Mfr: GlaxoSmithKline    Dose: 0.5 ml    Route: IM    Given by: Pura Spice, RN    Exp. Date: 09/19/2012    Lot #: [id number]    VIS given: 10/18/08 version given October 01, 2010.

## 2011-01-02 NOTE — Assessment & Plan Note (Signed)
Summary: skin tags removal/pt has 3 that need to be removed/cjr   History of Present Illness: Here to have several skin lesions removed. These rub against her clothing and become quite irritated and tender. She has 2 on the right neck, one in the right axilla, and one on the right lower abdomen.   Allergies: 1)  ! Vicodin 2)  ! Codeine 3)  ! Penicillin 4)  ! Prozac 5)  ! Percocet 6)  ! Tramadol Hcl  Past History:  Past Medical History: Reviewed history from 10/01/2010 and no changes required. Depression GERD Headache IBS Anxiety insomnia Allergic rhinitis Diabetes mellitus, type II Hypothyroidism  Past Surgical History: Reviewed history from 11/03/2007 and no changes required. Benign tumor removed from groin  Physical Exam  General:  Well-developed,well-nourished,in no acute distress; alert,appropriate and cooperative throughout examination Skin:  2 small skin tags on the right neck, there is a papular lesion in the right axilla measuring 0.8 cm in diameter, and there is a papular lesion on the right lower abdomen measuring 1.3 cm in diameter    Impression & Recommendations:  Problem # 1:  SKIN LESION (ICD-709.9)  Orders: Excise lesion (TAL) 0.6 - 1.0 (11401) Excise lesion (TAL) 1.1 - 2.0 cm (11402)  Problem # 2:  SKIN TAG (ICD-701.9)  Orders: Removal of Skin Tags up to 15 Lesions (11200)  Complete Medication List: 1)  Nexium 40 Mg Cpdr (Esomeprazole magnesium) .Marland Kitchen.. 1 by mouth once daily 2)  Sertraline Hcl 50 Mg Tabs (Sertraline hcl) .Marland Kitchen.. 1 by mouth once daily 3)  Xanax 0.5 Mg Tabs (Alprazolam) .... Three times a day as needed 4)  Temazepam 30 Mg Caps (Temazepam) .Marland Kitchen.. 1 at bedtime prn 5)  Hydroxyzine Hcl 25 Mg Tabs (Hydroxyzine hcl) .Marland Kitchen.. 1 by mouth every 4 hours as needed for itching 6)  Triamcinolone Acetonide 0.1 % Crea (Triamcinolone acetonide) .... Apply bid to affected area as needed 7)  Miralax Powd (Polyethylene glycol 3350) .Marland Kitchen.. 17g by mouth once  daily as needed constipation 8)  Ibuprofen 600 Mg Tabs (Ibuprofen) .... As needed 9)  Align Caps (Probiotic product) .... Once daily 10)  Glyburide-metformin 5-500 Mg Tabs (Glyburide-metformin) .... 2 tabs two times a day 11)  Zocor 40 Mg Tabs (Simvastatin) .... Once daily 12)  Tessalon Perles 100 Mg Caps (Benzonatate) .Marland Kitchen.. 1 q 4 hours as needed cough 13)  Januvia 100 Mg Tabs (Sitagliptin phosphate) .... Once daily 14)  Ra Truetest Test Strp (Glucose blood) .... Uad 15)  Synthroid 50 Mcg Tabs (Levothyroxine sodium) .Marland Kitchen.. 1 by mouth once daily  Patient Instructions: 1)  Informed consent was obtained to treat all these leisons. All four areas were cleansed with Betadine. The 2 skin tags on the neck were excised with iris scissors. Then LA was achieved around the other 2 lesions with 1 % Lidocaine with epinephrine. An elliptical incision was made around both of the papular lesions with a scalpel to the middle skin layer. Both were removed and  sent to Pathology. Both wounds were closed with 4-0 Ethilon with 2 sutures placed in the axilla and one placed on the lower abdomen. All were dressed with Neosporin and Bandaids. Sutures are to be removed in 10 days    Orders Added: 1)  Excise lesion (TAL) 0.6 - 1.0 [11401] 2)  Excise lesion (TAL) 1.1 - 2.0 cm [11402] 3)  Removal of Skin Tags up to 15 Lesions [11200]

## 2011-01-02 NOTE — Progress Notes (Signed)
Summary: refill Alprazolam  Phone Note Refill Request Message from:  Fax from Pharmacy on Harriette 20, 2011 10:46 AM  Refills Requested: Medication #1:  XANAX 0.5 MG TABS three times a day as needed   Dosage confirmed as above?Dosage Confirmed   Supply Requested: 1 month   Last Refilled: 04/21/2010  Method Requested: Fax to Local Pharmacy Initial call taken by: Raechel Ache, RN,  Shakerria 20, 2011 10:47 AM [facility] [place]  Follow-up for Phone Call        call in #90 with 5rf Follow-up by: Nelwyn Salisbury MD,  Allecia 20, 2011 5:09 PM    Prescriptions: Prudy Feeler 0.5 MG TABS (ALPRAZOLAM) three times a day as needed  #90 x 5   Entered by:   Raechel Ache, RN   Authorized by:   Nelwyn Salisbury MD   Signed by:   Raechel Ache, RN on 05/20/2010   Method used:   Historical   RxID:   [id number]

## 2011-01-02 NOTE — Progress Notes (Signed)
Summary: rx temazepam  Phone Note From Pharmacy   [facility]  [place] [id number] Summary of Call: refill temazepam 30mg   Initial call taken by: Pura Spice, RN,  October 18, 2010 3:29 PM  Follow-up for Phone Call        call in #30 with 5 rf Follow-up by: Nelwyn Salisbury MD,  October 18, 2010 4:10 PM  Additional Follow-up for Phone Call Additional follow up Details #1::        done  Additional Follow-up by: Pura Spice, RN,  October 18, 2010 4:14 PM    Prescriptions: TEMAZEPAM 30 MG CAPS (TEMAZEPAM) 1 at bedtime prn  #30 x 5   Entered by:   Pura Spice, RN   Authorized by:   Nelwyn Salisbury MD   Signed by:   Pura Spice, RN on 10/18/2010   Method used:   Telephoned to ...       [facility]  [place] (retail)       [address]       [place], [place]  [address]       Ph: [phone] or [phone]       Fax: [phone]   RxID:   [id number]

## 2011-01-02 NOTE — Progress Notes (Signed)
Summary: Pt is req to get skin tag removals done during cpx  Phone Note Call from Patient Call back at [phone] cell   Caller: Patient Summary of Call: Pt called and has sch cpx for 10/01/10 at 1:30. Pt is req to get skin tags removed during her cpx appt. Pls advise.      Initial call taken by: Lucy Antigua,  August 15, 2010 4:21 PM  Follow-up for Phone Call        No, she will need to schedule a separate visit for the skin tags  Follow-up by: Nelwyn Salisbury MD,  August 16, 2010 9:39 AM  Additional Follow-up for Phone Call Additional follow up Details #1::        I called pt and sch her for a skin tag removal on 10/09/10 at 10:00am Additional Follow-up by: Lucy Antigua,  August 16, 2010 9:49 AM

## 2011-02-18 ENCOUNTER — Other Ambulatory Visit: Payer: Self-pay

## 2011-02-18 MED ORDER — ALPRAZOLAM 0.5 MG PO TABS: 0.5000 mg | ORAL_TABLET | Freq: Three times a day (TID) | ORAL | Status: DC | PRN

## 2011-02-18 NOTE — Telephone Encounter (Signed)
Ok 'd by dr Scotty Court  Refill for alprazolam

## 2011-03-10 ENCOUNTER — Encounter: Payer: Self-pay | Admitting: Family Medicine

## 2011-03-25 ENCOUNTER — Telehealth: Payer: Self-pay | Admitting: Family Medicine

## 2011-03-25 NOTE — Telephone Encounter (Signed)
Pt called and lft vm on triage vm re: changing meds to 3 month refill before her insuracnce changes. Pt is req to get some blood work done in order to get back on generic Synthroid and also to get her blood sugar tested. Pls advise.

## 2011-03-26 NOTE — Telephone Encounter (Signed)
PT IS [facility] FOR 03-28-2011 830AM

## 2011-03-26 NOTE — Telephone Encounter (Signed)
Make an OV to get all this taken care of

## 2011-03-28 ENCOUNTER — Encounter: Payer: Self-pay | Admitting: Family Medicine

## 2011-03-28 ENCOUNTER — Ambulatory Visit (INDEPENDENT_AMBULATORY_CARE_PROVIDER_SITE_OTHER): Payer: BC Managed Care – PPO | Admitting: Family Medicine

## 2011-03-28 VITALS — BP 122/76 | HR 88 | Temp 99.3°F | Wt 193.0 lb

## 2011-03-28 DIAGNOSIS — E039 Hypothyroidism, unspecified: Secondary | ICD-10-CM

## 2011-03-28 DIAGNOSIS — E119 Type 2 diabetes mellitus without complications: Secondary | ICD-10-CM

## 2011-03-28 DIAGNOSIS — E785 Hyperlipidemia, unspecified: Secondary | ICD-10-CM

## 2011-03-28 LAB — LIPID PANEL
Cholesterol: 132 mg/dL (ref 0–200)
HDL: 32.5 mg/dL — ABNORMAL LOW (ref >39.00)
Triglycerides: 212 mg/dL — ABNORMAL HIGH (ref 0.0–149.0)

## 2011-03-28 LAB — HEPATIC FUNCTION PANEL
ALT: 63 U/L — ABNORMAL HIGH (ref 0–35)
AST: 71 U/L — ABNORMAL HIGH (ref 0–37)
Albumin: 4.2 g/dL (ref 3.5–5.2)
Total Bilirubin: 0.2 mg/dL — ABNORMAL LOW (ref 0.3–1.2)
Total Protein: 7.6 g/dL (ref 6.0–8.3)

## 2011-03-28 LAB — LDL CHOLESTEROL, DIRECT: Direct LDL: 74.2 mg/dL

## 2011-03-28 MED ORDER — ALPRAZOLAM 0.5 MG PO TABS: 0.5000 mg | ORAL_TABLET | Freq: Three times a day (TID) | ORAL | Status: AC | PRN

## 2011-03-28 MED ORDER — HALOBETASOL PROPIONATE 0.05 % EX CREA: TOPICAL_CREAM | Freq: Two times a day (BID) | CUTANEOUS | Status: DC

## 2011-03-28 MED ORDER — SIMVASTATIN 40 MG PO TABS: 40.0000 mg | ORAL_TABLET | Freq: Every day | ORAL | Status: DC

## 2011-03-28 MED ORDER — ESOMEPRAZOLE MAGNESIUM 40 MG PO CPDR: 40.0000 mg | DELAYED_RELEASE_CAPSULE | Freq: Every day | ORAL | Status: DC

## 2011-03-28 MED ORDER — SITAGLIPTIN PHOSPHATE 100 MG PO TABS: 100.0000 mg | ORAL_TABLET | Freq: Every day | ORAL | Status: DC

## 2011-03-28 MED ORDER — TEMAZEPAM 30 MG PO CAPS: 30.0000 mg | ORAL_CAPSULE | Freq: Every evening | ORAL | Status: DC | PRN

## 2011-03-28 MED ORDER — GLYBURIDE-METFORMIN 5-500 MG PO TABS: 2.0000 | ORAL_TABLET | Freq: Two times a day (BID) | ORAL | Status: DC

## 2011-03-28 MED ORDER — HYDROXYZINE HCL 25 MG PO TABS: 25.0000 mg | ORAL_TABLET | Freq: Three times a day (TID) | ORAL | Status: DC | PRN

## 2011-03-28 MED ORDER — SERTRALINE HCL 50 MG PO TABS: 50.0000 mg | ORAL_TABLET | Freq: Every day | ORAL | Status: DC

## 2011-03-28 NOTE — Progress Notes (Signed)
  Subjective:    Patient ID: Bonnie Gonzalez, female    DOB: 21-Mar-1956, 55 y.o.   MRN: [id number]  HPI Here to get fasting labs and to follow up on DM, HTN, lipids, and a recent diagnosis of hypothyroidism. She feels well in general.    Review of Systems  Constitutional: Negative.   Respiratory: Negative.   Cardiovascular: Negative.        Objective:   Physical Exam  Constitutional: She appears well-developed and well-nourished.  Neck: No thyromegaly present.  Cardiovascular: Normal rate, regular rhythm, normal heart sounds and intact distal pulses.   Pulmonary/Chest: Effort normal and breath sounds normal.          Assessment & Plan:  Get labs

## 2011-03-31 ENCOUNTER — Telehealth: Payer: Self-pay | Admitting: Family Medicine

## 2011-03-31 ENCOUNTER — Other Ambulatory Visit: Payer: Self-pay | Admitting: *Deleted

## 2011-03-31 MED ORDER — LEVOTHYROXINE SODIUM 150 MCG PO TABS: 150.0000 ug | ORAL_TABLET | Freq: Every day | ORAL | Status: DC

## 2011-03-31 NOTE — Telephone Encounter (Signed)
Pt would like blood work results °

## 2011-03-31 NOTE — Telephone Encounter (Signed)
Called and spoke with patient about lab results; pt is aware; [facility] [place]

## 2011-06-26 ENCOUNTER — Other Ambulatory Visit: Payer: Self-pay | Admitting: Family Medicine

## 2011-06-26 NOTE — Telephone Encounter (Signed)
Script was sent e-scribe 

## 2011-06-30 ENCOUNTER — Telehealth: Payer: Self-pay | Admitting: Family Medicine

## 2011-06-30 NOTE — Telephone Encounter (Signed)
No samples of nexium, januvia samples up front, told pt to apply for pt assistance.  I gave her the web site to print and fill out forms

## 2011-06-30 NOTE — Telephone Encounter (Signed)
Requesting Januvia and Nexium samples. She is unemployed. Thanks.

## 2011-07-07 ENCOUNTER — Telehealth: Payer: Self-pay | Admitting: Family Medicine

## 2011-07-07 MED ORDER — ESOMEPRAZOLE MAGNESIUM 40 MG PO CPDR: 40.0000 mg | DELAYED_RELEASE_CAPSULE | Freq: Every day | ORAL | Status: DC

## 2011-07-07 NOTE — Telephone Encounter (Signed)
done

## 2011-08-31 ENCOUNTER — Other Ambulatory Visit: Payer: Self-pay | Admitting: Family Medicine

## 2011-09-01 ENCOUNTER — Telehealth: Payer: Self-pay | Admitting: Family Medicine

## 2011-09-01 NOTE — Telephone Encounter (Signed)
Pt requesting samples of Januvia 100 mg. Please contact pt

## 2011-09-02 NOTE — Telephone Encounter (Signed)
Scripts sent e-scribe 

## 2011-09-04 NOTE — Telephone Encounter (Signed)
Spoke with pt and samples are ready for pick up.

## 2011-09-26 ENCOUNTER — Other Ambulatory Visit: Payer: Self-pay | Admitting: Family Medicine

## 2011-09-30 ENCOUNTER — Telehealth: Payer: Self-pay | Admitting: Family Medicine

## 2011-09-30 NOTE — Telephone Encounter (Signed)
Requesting more Januvia samples. She is unemployed. Thanks.

## 2011-10-03 NOTE — Telephone Encounter (Signed)
Samples ready for pick up. Left message on machine for patient

## 2011-11-04 ENCOUNTER — Telehealth: Payer: Self-pay

## 2011-11-04 NOTE — Telephone Encounter (Signed)
Pt called requesting zoloft samples until she can go to Health Dept to get assistance because her employment is running out.  Pt aware we do not have samples of Zoloft in the office.

## 2011-11-10 ENCOUNTER — Telehealth: Payer: Self-pay | Admitting: Family Medicine

## 2011-11-10 MED ORDER — AZITHROMYCIN 250 MG PO TABS: ORAL_TABLET | ORAL | Status: AC

## 2011-11-10 NOTE — Telephone Encounter (Signed)
Spoke with pt and sent script e-scribe. 

## 2011-11-10 NOTE — Telephone Encounter (Signed)
Call in a Zpack. Take Mucinex bid

## 2011-11-10 NOTE — Telephone Encounter (Signed)
Pt states this started Saturday.  Body aches, hot to cold, cough, mucus that is yellowish in color, ear pain, not sure if pt has had a fever because she does not have a thermometer.  Pt states she had grandchildren over on the weekend and 3 of them tested positive for the flu.  Pls advise.

## 2011-11-10 NOTE — Telephone Encounter (Signed)
Wants to see Dr Clent Ridges today. She thinks that she might have the flu. Her symptoms started on Saturday. Thanks.

## 2011-11-24 ENCOUNTER — Telehealth: Payer: Self-pay | Admitting: Family Medicine

## 2011-11-24 MED ORDER — SITAGLIPTIN PHOSPHATE 100 MG PO TABS: 100.0000 mg | ORAL_TABLET | Freq: Every day | ORAL | Status: DC

## 2011-11-24 NOTE — Telephone Encounter (Signed)
This will be faxed in for mail order

## 2011-11-24 NOTE — Telephone Encounter (Signed)
It was faxed

## 2011-12-03 ENCOUNTER — Telehealth: Payer: Self-pay | Admitting: Family Medicine

## 2011-12-03 NOTE — Telephone Encounter (Signed)
Pt will have health department refax rx form

## 2011-12-09 ENCOUNTER — Telehealth: Payer: Self-pay | Admitting: Family Medicine

## 2011-12-09 MED ORDER — ATORVASTATIN CALCIUM 40 MG PO TABS: 40.0000 mg | ORAL_TABLET | Freq: Every day | ORAL | Status: DC

## 2011-12-09 MED ORDER — LEVOTHYROXINE SODIUM 150 MCG PO TABS: 150.0000 ug | ORAL_TABLET | Freq: Every day | ORAL | Status: DC

## 2011-12-09 NOTE — Telephone Encounter (Signed)
Pt requesting substitute medications. Dr. Clent Ridges did approve Lipitor 40 mg take 1 po qd  ( 90 day supply ) and brand name Synthroid 150 mcg take 1 po qd. Please print and fax to [phone]. I spoke with pt and she is aware of this. Also she is going to schedule a office visit to discuss diabetes medication with Dr. Clent Ridges. I faxed both scripts.

## 2011-12-11 ENCOUNTER — Ambulatory Visit (INDEPENDENT_AMBULATORY_CARE_PROVIDER_SITE_OTHER): Payer: Self-pay | Admitting: Family Medicine

## 2011-12-11 ENCOUNTER — Encounter: Payer: Self-pay | Admitting: Family Medicine

## 2011-12-11 VITALS — BP 124/82 | HR 84 | Temp 98.3°F | Wt 184.0 lb

## 2011-12-11 DIAGNOSIS — E119 Type 2 diabetes mellitus without complications: Secondary | ICD-10-CM

## 2011-12-11 DIAGNOSIS — E039 Hypothyroidism, unspecified: Secondary | ICD-10-CM

## 2011-12-11 DIAGNOSIS — E785 Hyperlipidemia, unspecified: Secondary | ICD-10-CM

## 2011-12-11 LAB — HEPATIC FUNCTION PANEL
ALT: 85 U/L — ABNORMAL HIGH (ref 0–35)
AST: 60 U/L — ABNORMAL HIGH (ref 0–37)
Bilirubin, Direct: 0 mg/dL (ref 0.0–0.3)
Total Bilirubin: 0.5 mg/dL (ref 0.3–1.2)

## 2011-12-11 LAB — HEMOGLOBIN A1C: Hgb A1c MFr Bld: 8.5 % — ABNORMAL HIGH (ref 4.6–6.5)

## 2011-12-11 NOTE — Progress Notes (Signed)
  Subjective:    Patient ID: Bonnie Gonzalez, female    DOB: 28-Jul-1956, 56 y.o.   MRN: [id number]  HPI Here for fasting labs. She feels fine but her glucoses remain high at home. Her am fasting glucoses run from 140 to 200. She is trying to change her meds around to make them more affordable since she still has not been able to find a job. She can get insulin from the Health Dept for free if we choose to put her on this.    Review of Systems  Constitutional: Negative.   Respiratory: Negative.   Cardiovascular: Negative.        Objective:   Physical Exam  Constitutional: She appears well-developed and well-nourished.  Cardiovascular: Normal rate, regular rhythm, normal heart sounds and intact distal pulses.   Pulmonary/Chest: Effort normal and breath sounds normal.          Assessment & Plan:  Get labs. We may end up switching her to insulin depending on the results we get.

## 2011-12-16 ENCOUNTER — Encounter: Payer: Self-pay | Admitting: Family Medicine

## 2011-12-16 NOTE — Progress Notes (Signed)
Quick Note:  Left voice message, also put a copy of lab results in mail. Advised pt to call back and give [name] the name of the diabetic medication that she can afford to get. ______

## 2011-12-19 ENCOUNTER — Telehealth: Payer: Self-pay | Admitting: Family Medicine

## 2011-12-19 MED ORDER — INSULIN GLARGINE 100 UNIT/ML ~~LOC~~ SOLN: 20.0000 [IU] | Freq: Every day | SUBCUTANEOUS | Status: DC

## 2011-12-19 MED ORDER — INSULIN ASPART PROT & ASPART (70-30 MIX) 100 UNIT/ML ~~LOC~~ SUSP: 5.0000 [IU] | Freq: Two times a day (BID) | SUBCUTANEOUS | Status: DC

## 2011-12-19 NOTE — Telephone Encounter (Signed)
I ordered Lantus Solostar and Novolog Mix Flexpens for her plus needles. She needs to take the oral meds up until the the day she starts the insulin. On that day, do not take any oral diabetic meds. See me one week after that with a log of glucose readings

## 2011-12-19 NOTE — Telephone Encounter (Signed)
Left voice message.

## 2011-12-19 NOTE — Telephone Encounter (Signed)
Returning your call. °

## 2011-12-19 NOTE — Telephone Encounter (Signed)
I spoke with Nash Mantis from Medical Assistance and both Lantus and Novolog flex pens are covered. Please print out and I will fax orders. Also confirm when to stop the oral diabetic medication? ( fax # [phone] )

## 2011-12-19 NOTE — Telephone Encounter (Signed)
Spoke with pt

## 2011-12-29 ENCOUNTER — Telehealth: Payer: Self-pay | Admitting: Family Medicine

## 2011-12-29 NOTE — Telephone Encounter (Signed)
Increase the Novolog to 15 units bid and increase the Lantus to 30 units at night. Call us back in one week with glucoses

## 2011-12-29 NOTE — Telephone Encounter (Signed)
Spoke with pt

## 2011-12-29 NOTE — Telephone Encounter (Signed)
Pt called to give glucose readings and wants to know if she needs to increase the insulin amounts? Pt is unsure if she did wait the 2 hours after having a snack before she checked her blood sugar.  12/26/11 6:30 am 278, 6 pm 248, 10:30 pm 487 12/27/11 9 am 273, 5:45 pm 310, 10:30 pm 530 12/28/11 8:45 am 305, 6 pm 309, 10 pm 380 12/29/11 9:30 am 313

## 2012-01-05 ENCOUNTER — Telehealth: Payer: Self-pay | Admitting: Family Medicine

## 2012-01-05 MED ORDER — INSULIN GLARGINE 100 UNIT/ML ~~LOC~~ SOLN: 45.0000 [IU] | Freq: Every day | SUBCUTANEOUS | Status: DC

## 2012-01-05 MED ORDER — INSULIN ASPART PROT & ASPART (70-30 MIX) 100 UNIT/ML ~~LOC~~ SUSP: 25.0000 [IU] | Freq: Two times a day (BID) | SUBCUTANEOUS | Status: DC

## 2012-01-05 NOTE — Telephone Encounter (Signed)
Scripts were faxed to [facility] # [phone] and I spoke with pt.

## 2012-01-05 NOTE — Telephone Encounter (Signed)
Pt called and said that her glucose levels are still high. The lowest is around 200, mostly in the high 200's and it was in the 400's twice. Please advise? Also I need to send in refill request to [facility].

## 2012-01-05 NOTE — Telephone Encounter (Signed)
Increase the Novolog to 25 units bid, and increase the Lantus to 45 units per day

## 2012-01-12 ENCOUNTER — Telehealth: Payer: Self-pay | Admitting: Family Medicine

## 2012-01-12 NOTE — Telephone Encounter (Signed)
Increase the Novolog to 30 units bid and increase the Lantus to 60 units qhs . Call back in one week

## 2012-01-12 NOTE — Telephone Encounter (Signed)
Spoke with pt and went over new instructions.

## 2012-01-12 NOTE — Telephone Encounter (Signed)
Pt called to give glucose readings: It was 500 X 2, 400 X 2, 300 X 12, 200 X 8, 100 X 2. Please advise?

## 2012-01-19 ENCOUNTER — Telehealth: Payer: Self-pay | Admitting: Family Medicine

## 2012-01-19 NOTE — Telephone Encounter (Signed)
Increase the Novolog to 35 units bid and increase the Lantus to 70 units

## 2012-01-19 NOTE — Telephone Encounter (Signed)
Pt called to give glucose readings: 500 x 2, 400 x 1, 300 x 8, 200 x14, 100 x 4. Pt is on Novolog 30 units bid and Lantus 60 units qhs.

## 2012-01-20 ENCOUNTER — Telehealth: Payer: Self-pay | Admitting: Family Medicine

## 2012-01-20 NOTE — Telephone Encounter (Signed)
Refill request for Temazepam 30 mg take 1 po qhs prn and pt last here on 12/11/11.

## 2012-01-20 NOTE — Telephone Encounter (Signed)
Left voice message for pt to return my call and I will try again later.

## 2012-01-20 NOTE — Telephone Encounter (Signed)
Spoke with pt

## 2012-01-20 NOTE — Telephone Encounter (Signed)
Returning call.

## 2012-01-20 NOTE — Telephone Encounter (Signed)
Call in #30 with 5 rf 

## 2012-01-21 MED ORDER — TEMAZEPAM 30 MG PO CAPS: 30.0000 mg | ORAL_CAPSULE | Freq: Every evening | ORAL | Status: DC | PRN

## 2012-01-21 NOTE — Telephone Encounter (Signed)
Script called in and spoke with pt. 

## 2012-01-26 ENCOUNTER — Telehealth: Payer: Self-pay | Admitting: Family Medicine

## 2012-01-26 NOTE — Telephone Encounter (Signed)
Increase Lantus to 80 units and increase Novolog to 45 units bid

## 2012-01-26 NOTE — Telephone Encounter (Signed)
Blood sugar reading are slightly high..300/400/200/100

## 2012-01-27 NOTE — Telephone Encounter (Signed)
I faxed new insulin dosage to [facility] per pt request.

## 2012-01-27 NOTE — Telephone Encounter (Signed)
Spoke with pt and went over new directions. Pt requested a copy of insulin increases to [facility].  ( please fax )

## 2012-01-29 ENCOUNTER — Telehealth: Payer: Self-pay | Admitting: Family Medicine

## 2012-01-29 MED ORDER — INSULIN ASPART PROT & ASPART (70-30 MIX) 100 UNIT/ML ~~LOC~~ SUSP: 45.0000 [IU] | Freq: Two times a day (BID) | SUBCUTANEOUS | Status: DC

## 2012-01-29 MED ORDER — INSULIN GLARGINE 100 UNIT/ML ~~LOC~~ SOLN: 80.0000 [IU] | Freq: Every day | SUBCUTANEOUS | Status: DC

## 2012-01-29 NOTE — Telephone Encounter (Signed)
Scripts were printed and faxed per pt request to [facility] pharmacy. [phone] )

## 2012-02-02 ENCOUNTER — Telehealth: Payer: Self-pay | Admitting: Family Medicine

## 2012-02-02 DIAGNOSIS — E669 Obesity, unspecified: Secondary | ICD-10-CM

## 2012-02-02 DIAGNOSIS — E1169 Type 2 diabetes mellitus with other specified complication: Secondary | ICD-10-CM

## 2012-02-02 NOTE — Telephone Encounter (Signed)
Pt called in with glucose readings: 5 x 400's, 4 x 300's, 6 x 200's, 9 x 100's and once it was below 100.

## 2012-02-02 NOTE — Telephone Encounter (Signed)
Spoke to pt

## 2012-02-02 NOTE — Telephone Encounter (Signed)
Her glucoses are so erratic that I think she should see a specialist. We will refer her to Dr. Everardo All for diabetes. Stay on the same insulin doses for now

## 2012-02-09 ENCOUNTER — Telehealth: Payer: Self-pay | Admitting: Family Medicine

## 2012-02-09 NOTE — Telephone Encounter (Signed)
Pt blood sugars over last week 4 blood sugars under 100mg  and 13 blood sugars in 100's and 7 blood sugars in 200's and 2 blood sugars in 300's.

## 2012-02-11 NOTE — Telephone Encounter (Signed)
Spoke with pt

## 2012-02-11 NOTE — Telephone Encounter (Signed)
Keep the insulin doses the same until she sees Dr. Everardo All

## 2012-02-18 ENCOUNTER — Telehealth: Payer: Self-pay

## 2012-02-18 MED ORDER — AZITHROMYCIN 250 MG PO TABS: ORAL_TABLET | ORAL | Status: AC

## 2012-02-18 NOTE — Telephone Encounter (Signed)
Triage VM:  Pt sates she has gotten a cold and her sinuses are bothering her.  Pt thinks her sinuses are infected.  Pt's symptoms include: coughing, laryngitis, and green mucus.  Pt states she is on her way to a job fair and had to pull over due to dizziness.  Pt has been taking Mucinex and Ibuprofen.  Pt states she will not be available until after noon. Pt is using the [facility] Medication [facility] but if they do not cover antibiotics pt would like a cost effective antibiotic called in to [facility].  Pls advise.

## 2012-02-18 NOTE — Telephone Encounter (Signed)
Z-pak faxed to [facility].

## 2012-02-18 NOTE — Telephone Encounter (Signed)
Call in a Zpack  ?

## 2012-02-25 ENCOUNTER — Ambulatory Visit: Payer: Self-pay | Admitting: Endocrinology

## 2012-04-14 ENCOUNTER — Other Ambulatory Visit: Payer: Self-pay | Admitting: Family Medicine

## 2012-05-16 ENCOUNTER — Other Ambulatory Visit: Payer: Self-pay | Admitting: Family Medicine

## 2012-05-17 ENCOUNTER — Telehealth: Payer: Self-pay | Admitting: Family Medicine

## 2012-05-17 NOTE — Telephone Encounter (Signed)
Refill request for Xanax 0.5 mg take 1 po tid and pt last here on 12/11/11.

## 2012-05-18 ENCOUNTER — Other Ambulatory Visit: Payer: Self-pay | Admitting: Family Medicine

## 2012-05-18 MED ORDER — ALPRAZOLAM 0.5 MG PO TABS: 0.5000 mg | ORAL_TABLET | Freq: Three times a day (TID) | ORAL | Status: AC | PRN

## 2012-05-18 NOTE — Telephone Encounter (Signed)
Call in #90 with 5 rf 

## 2012-05-18 NOTE — Telephone Encounter (Signed)
Sent to the pharmacy #90 with 5 refills per Dr. Clent Ridges.

## 2012-05-18 NOTE — Telephone Encounter (Signed)
Called to the pharmacy per Dr. Clent Ridges.

## 2012-06-01 ENCOUNTER — Ambulatory Visit (INDEPENDENT_AMBULATORY_CARE_PROVIDER_SITE_OTHER): Payer: BC Managed Care – PPO | Admitting: Endocrinology

## 2012-06-01 ENCOUNTER — Encounter: Payer: Self-pay | Admitting: Endocrinology

## 2012-06-01 VITALS — BP 104/62 | HR 75 | Temp 98.0°F | Ht 60.0 in | Wt 203.0 lb

## 2012-06-01 DIAGNOSIS — E119 Type 2 diabetes mellitus without complications: Secondary | ICD-10-CM

## 2012-06-01 MED ORDER — INSULIN ASPART 100 UNIT/ML ~~LOC~~ SOLN: SUBCUTANEOUS | Status: DC

## 2012-06-01 NOTE — Progress Notes (Signed)
Subjective:    Patient ID: Bonnie Gonzalez, female    DOB: [birth year], 56 y.o.   MRN: [id number]  HPI pt states 3 years h/o dm.  she is unaware of any chronic complications.  she has been on insulin x approx 18 mos.  pt says her diet and exercise are "pretty good."  She has few mos of moderate cold intolerance of the feet, and assoc pain.  Pt says she has intermittent mild hypoglycemia at lunch, or in the afternoon.  Otherwise it is as high as 400.  It is in general highest at hs.   Past Medical History  Diagnosis Date  . Depression   . GERD (gastroesophageal reflux disease)   . Anxiety   . Allergy   . Diabetes mellitus   . Tuberculosis     hydrothyroidism  . Insomnia   . IBS (irritable bowel syndrome)     Past Surgical History  Procedure Date  . Benign tumor      removed from groin    History   Social History  . Marital Status: Divorced    Spouse Name: N/A    Number of Children: N/A  . Years of Education: N/A   Occupational History  . Not on file.   Social History Main Topics  . Smoking status: Former Games developer  . Smokeless tobacco: Never Used  . Alcohol Use: No  . Drug Use: No  . Sexually Active: Not on file   Other Topics Concern  . Not on file   Social History Narrative  . No narrative on file    Current Outpatient Prescriptions on File Prior to Visit  Medication Sig Dispense Refill  . ALPRAZolam (XANAX) 0.5 MG tablet Take 1 tablet (0.5 mg total) by mouth 3 (three) times daily as needed for sleep.  90 tablet  05  . atorvastatin (LIPITOR) 40 MG tablet Take 1 tablet (40 mg total) by mouth daily.  90 tablet  3  . benzonatate (TESSALON) 100 MG capsule Take 100 mg by mouth 3 (three) times daily as needed.        Marland Kitchen esomeprazole (NEXIUM) 40 MG capsule Take 1 capsule (40 mg total) by mouth daily before breakfast.  90 capsule  3  . halobetasol (ULTRAVATE) 0.05 % cream APPLY TOPICALLY TWICE A DAY  50 g  6  . hydrOXYzine (ATARAX/VISTARIL) 25 MG tablet TAKE 1 TABLET BY  MOUTH 3 TIMES A DAY AS NEEDED  270 tablet  2  . ibuprofen (ADVIL,MOTRIN) 600 MG tablet Take 600 mg by mouth every 6 (six) hours as needed.        . insulin aspart protamine-insulin aspart (NOVOLOG 70/30) (70-30) 100 UNIT/ML injection Inject 45 Units into the skin 2 (two) times daily with a meal.  90 mL  6  . insulin glargine (LANTUS) 100 UNIT/ML injection Inject 80 Units into the skin at bedtime.  75 mL  6  . levothyroxine (SYNTHROID, LEVOTHROID) 150 MCG tablet Take 1 tablet (150 mcg total) by mouth daily.  90 tablet  3  . sertraline (ZOLOFT) 50 MG tablet 1 BY MOUTH ONCE DAILY  30 tablet  11  . temazepam (RESTORIL) 30 MG capsule Take 1 capsule (30 mg total) by mouth at bedtime as needed.  30 capsule  5  . TRUETEST TEST test strip USE AS DIRECTED  100 each  3    Allergies  Allergen Reactions  . Codeine   . Fluoxetine Hcl   . Hydrocodone-Acetaminophen   . Oxycodone-Acetaminophen   .  Penicillins   . Tramadol Hcl     Family History  Problem Relation Age of Onset  . Asthma      fhx  . Depression      fhx  . Diabetes      fhx  . Parkinsonism      fhx  . Lupus      fhx  DM: father and multiple sibs  BP 104/62  Pulse 75  Temp 98 F (36.7 C) (Oral)  Ht 5' (1.524 m)  Wt 203 lb (92.08 kg)  BMI 39.65 kg/m2  SpO2 95%  Review of Systems denies blurry vision, chest pain, sob, n/v, memory loss, depression, menopausal sxs, rhinorrhea, and easy bruising.  She has weight gain, urinary frequency, muscle cramps, excessive diaphoresis, fatigue, and intermittent headache.    Objective:   Physical Exam VS: see vs page GEN: no distress HEAD: head: no deformity eyes: no periorbital swelling, no proptosis external nose and ears are normal mouth: no lesion seen NECK: supple, thyroid is not enlarged CHEST WALL: no deformity LUNGS:  Clear to auscultation CV: reg rate and rhythm, no murmur ABD: abdomen is soft, nontender.  no hepatosplenomegaly.  not distended.  no hernia MUSCULOSKELETAL:  muscle bulk and strength are grossly normal.  no obvious joint swelling.  gait is normal and steady EXTEMITIES: no deformity.  no ulcer on the feet.  feet are of normal color and temp.  Trace bilat leg edema PULSES: dorsalis pedis intact bilat.  no carotid bruit NEURO:  cn 2-12 grossly intact.   readily moves all 4's.  sensation is intact to touch on the feet SKIN:  Normal texture and temperature.  No rash or suspicious lesion is visible.   NODES:  None palpable at the neck PSYCH: alert, oriented x3.  Does not appear anxious nor depressed.   Lab Results  Component Value Date   HGBA1C 8.5* 12/11/2011      Assessment & Plan:  DM.  needs increased rx cold intolerance, possibly neuropathic Weight gain.  This complicates the rx of DM.

## 2012-06-01 NOTE — Patient Instructions (Addendum)
good diet and exercise habits significanly improve the control of your diabetes.  please let me know if you wish to be referred to a dietician.  high blood sugar is very risky to your health.  you should see an eye doctor every year. controlling your blood pressure and cholesterol drastically reduces the damage diabetes does to your body.  this also applies to quitting smoking.  please discuss these with your doctor.  you should take an aspirin every day, unless you have been advised by a doctor not to. check your blood sugar twice a day.  vary the time of day when you check, between before the 3 meals, and at bedtime.  also check if you have symptoms of your blood sugar being too high or too low.  please keep a record of the readings and bring it to your next appointment here.  please call us sooner if your blood sugar goes below 70, or if it stays over 200.  change the novolog 70/30 to novolog 3x a day (just before each meal) 09-20-39 units. Please continue the same lantus for now.   Please come back for a follow-up appointment in 2 weeks.

## 2012-06-04 ENCOUNTER — Telehealth: Payer: Self-pay | Admitting: *Deleted

## 2012-06-04 NOTE — Telephone Encounter (Signed)
Pt is in 2 insulins:  3x a day, and 1x a day, for a total of 4

## 2012-06-04 NOTE — Telephone Encounter (Signed)
Pharmacist called reqarding directions for change of insulin for patient. Verified new Rx order to pharmacist of change in insulin. Pharmacist question the pens/needles 4xday. ?

## 2012-06-22 ENCOUNTER — Ambulatory Visit (INDEPENDENT_AMBULATORY_CARE_PROVIDER_SITE_OTHER): Payer: BC Managed Care – PPO | Admitting: Endocrinology

## 2012-06-22 ENCOUNTER — Encounter: Payer: Self-pay | Admitting: Endocrinology

## 2012-06-22 VITALS — BP 138/74 | HR 84 | Temp 97.8°F | Wt 204.0 lb

## 2012-06-22 DIAGNOSIS — E119 Type 2 diabetes mellitus without complications: Secondary | ICD-10-CM

## 2012-06-22 NOTE — Patient Instructions (Addendum)
check your blood sugar twice a day.  vary the time of day when you check, between before the 3 meals, and at bedtime.  also check if you have symptoms of your blood sugar being too high or too low.  please keep a record of the readings and bring it to your next appointment here.  please call us sooner if your blood sugar goes below 70, or if it stays over 200.  Increase novolog to 3x a day (just before each meal) 09-19-54 units.  Please continue the same lantus for now.   Please come back for a follow-up appointment in 1 month.     "alli" is a safe weight-loss medication.

## 2012-06-22 NOTE — Progress Notes (Signed)
Subjective:    Patient ID: Bonnie Gonzalez, female    DOB: [birth year], 56 y.o.   MRN: [id number]  HPI Pt returns for f/u of insulin-requiring DM (dx'ed 2010; no known complications).  she brings a record of her cbg's which i have reviewed today.  It varies from 100-200.  It is in general highest at hs, and lowest in the afternoon.   Past Medical History  Diagnosis Date  . Depression   . GERD (gastroesophageal reflux disease)   . Anxiety   . Allergy   . Diabetes mellitus   . Tuberculosis     hydrothyroidism  . Insomnia   . IBS (irritable bowel syndrome)     Past Surgical History  Procedure Date  . Benign tumor      removed from groin    History   Social History  . Marital Status: Divorced    Spouse Name: N/A    Number of Children: N/A  . Years of Education: N/A   Occupational History  . Not on file.   Social History Main Topics  . Smoking status: Former Games developer  . Smokeless tobacco: Never Used  . Alcohol Use: No  . Drug Use: No  . Sexually Active: Not on file   Other Topics Concern  . Not on file   Social History Narrative  . No narrative on file    Current Outpatient Prescriptions on File Prior to Visit  Medication Sig Dispense Refill  . atorvastatin (LIPITOR) 40 MG tablet Take 1 tablet (40 mg total) by mouth daily.  90 tablet  3  . b complex vitamins tablet Take 1 tablet by mouth daily.      Marland Kitchen esomeprazole (NEXIUM) 40 MG capsule Take 1 capsule (40 mg total) by mouth daily before breakfast.  90 capsule  3  . halobetasol (ULTRAVATE) 0.05 % cream APPLY TOPICALLY TWICE A DAY  50 g  6  . hydrOXYzine (ATARAX/VISTARIL) 25 MG tablet TAKE 1 TABLET BY MOUTH 3 TIMES A DAY AS NEEDED  270 tablet  2  . ibuprofen (ADVIL,MOTRIN) 600 MG tablet Take 600 mg by mouth every 6 (six) hours as needed.        . insulin aspart (NOVOLOG) 100 UNIT/ML injection 3x a day (just before each meal) 09-19-54 units, and pen needles 4/day.      . insulin glargine (LANTUS) 100 UNIT/ML injection  Inject 80 Units into the skin at bedtime.  75 mL  6  . Lactobacillus (ACIDOPHILUS) CAPS Take 1 capsule by mouth daily.      Marland Kitchen levothyroxine (SYNTHROID, LEVOTHROID) 150 MCG tablet Take 1 tablet (150 mcg total) by mouth daily.  90 tablet  3  . Nutritional Supplements (ESTROVEN) TABS Take 1 tablet by mouth daily.      . sertraline (ZOLOFT) 50 MG tablet 1 BY MOUTH ONCE DAILY  30 tablet  11  . temazepam (RESTORIL) 30 MG capsule Take 1 capsule (30 mg total) by mouth at bedtime as needed.  30 capsule  5  . TRUETEST TEST test strip USE AS DIRECTED  100 each  3  . benzonatate (TESSALON) 100 MG capsule Take 100 mg by mouth 3 (three) times daily as needed.          Allergies  Allergen Reactions  . Codeine   . Fluoxetine Hcl   . Hydrocodone-Acetaminophen   . Oxycodone-Acetaminophen   . Penicillins   . Tramadol Hcl     Family History  Problem Relation Age of Onset  .  Asthma      fhx  . Depression      fhx  . Diabetes      fhx  . Parkinsonism      fhx  . Lupus      fhx   BP 138/74  Pulse 84  Temp 97.8 F (36.6 C) (Oral)  Wt 204 lb (92.534 kg)  SpO2 94%  Review of Systems denies hypoglycemia    Objective:   Physical Exam VITAL SIGNS:  See vs page GENERAL: no distress PSYCH: Alert and oriented x 3.  Does not appear anxious nor depressed.     Assessment & Plan:  DM.  needs increased rx

## 2012-07-12 ENCOUNTER — Telehealth: Payer: Self-pay | Admitting: Endocrinology

## 2012-07-12 MED ORDER — INSULIN GLARGINE 100 UNIT/ML ~~LOC~~ SOLN: 50.0000 [IU] | Freq: Two times a day (BID) | SUBCUTANEOUS | Status: DC

## 2012-07-12 NOTE — Telephone Encounter (Signed)
The pt called the triage line and is hoping to get some information regarding her blood sugars.  She states that each morning her blood sugar is between 200-350.  She is wondering if she needs to adjust her medication.    Thanks!

## 2012-07-12 NOTE — Telephone Encounter (Signed)
Change Lantus to 50U BID -  continue Novolog before each meal as ongoing 09-19-54

## 2012-07-12 NOTE — Telephone Encounter (Signed)
Called pt no answer LMOM RTC... 07/12/12@12 :05pm/LMB

## 2012-07-12 NOTE — Telephone Encounter (Signed)
Pt return call back gave md response.... 07/12/12@1 :47pm/LMB

## 2012-07-27 ENCOUNTER — Encounter: Payer: Self-pay | Admitting: Endocrinology

## 2012-07-27 ENCOUNTER — Ambulatory Visit (INDEPENDENT_AMBULATORY_CARE_PROVIDER_SITE_OTHER): Payer: BC Managed Care – PPO | Admitting: Endocrinology

## 2012-07-27 VITALS — BP 110/68 | HR 83 | Temp 98.2°F | Ht 60.0 in | Wt 207.0 lb

## 2012-07-27 DIAGNOSIS — E119 Type 2 diabetes mellitus without complications: Secondary | ICD-10-CM

## 2012-07-27 NOTE — Progress Notes (Signed)
Subjective:    Patient ID: Bonnie Gonzalez, female    DOB: 01-07-1956, 56 y.o.   MRN: [id number]  HPI Pt returns for f/u of insulin-requiring DM (dx'ed 2010; no known complications).  Pt called 2 weeks ago, and lantus was increased.  she brings a record of her cbg's which i have reviewed today.  It varies from 90-250.  It is lowest in the afternoon, and highest at hs, and in am.   Past Medical History  Diagnosis Date  . Depression   . GERD (gastroesophageal reflux disease)   . Anxiety   . Allergy   . Diabetes mellitus   . Tuberculosis     hydrothyroidism  . Insomnia   . IBS (irritable bowel syndrome)     Past Surgical History  Procedure Date  . Benign tumor      removed from groin    History   Social History  . Marital Status: Divorced    Spouse Name: N/A    Number of Children: N/A  . Years of Education: N/A   Occupational History  . Not on file.   Social History Main Topics  . Smoking status: Former Games developer  . Smokeless tobacco: Never Used  . Alcohol Use: No  . Drug Use: No  . Sexually Active: Not on file   Other Topics Concern  . Not on file   Social History Narrative  . No narrative on file    Current Outpatient Prescriptions on File Prior to Visit  Medication Sig Dispense Refill  . atorvastatin (LIPITOR) 40 MG tablet Take 1 tablet (40 mg total) by mouth daily.  90 tablet  3  . b complex vitamins tablet Take 1 tablet by mouth daily.      . benzonatate (TESSALON) 100 MG capsule Take 100 mg by mouth 3 (three) times daily as needed.        Marland Kitchen esomeprazole (NEXIUM) 40 MG capsule Take 1 capsule (40 mg total) by mouth daily before breakfast.  90 capsule  3  . halobetasol (ULTRAVATE) 0.05 % cream APPLY TOPICALLY TWICE A DAY  50 g  6  . hydrOXYzine (ATARAX/VISTARIL) 25 MG tablet TAKE 1 TABLET BY MOUTH 3 TIMES A DAY AS NEEDED  270 tablet  2  . ibuprofen (ADVIL,MOTRIN) 600 MG tablet Take 600 mg by mouth every 6 (six) hours as needed.        . insulin aspart (NOVOLOG)  100 UNIT/ML injection 3x a day (just before each meal) 09-10-79 units, and pen needles 4/day.      . insulin glargine (LANTUS) 100 UNIT/ML injection Inject 50 Units into the skin 2 (two) times daily.  75 mL  6  . Lactobacillus (ACIDOPHILUS) CAPS Take 1 capsule by mouth daily.      Marland Kitchen levothyroxine (SYNTHROID, LEVOTHROID) 150 MCG tablet Take 1 tablet (150 mcg total) by mouth daily.  90 tablet  3  . Nutritional Supplements (ESTROVEN) TABS Take 1 tablet by mouth daily.      . sertraline (ZOLOFT) 50 MG tablet 1 BY MOUTH ONCE DAILY  30 tablet  11  . temazepam (RESTORIL) 30 MG capsule Take 1 capsule (30 mg total) by mouth at bedtime as needed.  30 capsule  5  . TRUETEST TEST test strip USE AS DIRECTED  100 each  3    Allergies  Allergen Reactions  . Codeine   . Fluoxetine Hcl   . Hydrocodone-Acetaminophen   . Oxycodone-Acetaminophen   . Penicillins   . Tramadol Hcl  Family History  Problem Relation Age of Onset  . Asthma      fhx  . Depression      fhx  . Diabetes      fhx  . Parkinsonism      fhx  . Lupus      fhx    BP 110/68  Pulse 83  Temp 98.2 F (36.8 C) (Oral)  Ht 5' (1.524 m)  Wt 207 lb (93.895 kg)  BMI 40.43 kg/m2  SpO2 93%   Review of Systems denies hypoglycemia    Objective:   Physical Exam VITAL SIGNS:  See vs page GENERAL: no distress PSYCH: Alert and oriented x 3.  Does not appear anxious nor depressed.      Assessment & Plan:  DM: needs increased rx

## 2012-07-27 NOTE — Patient Instructions (Addendum)
check your blood sugar twice a day.  vary the time of day when you check, between before the 3 meals, and at bedtime.  also check if you have symptoms of your blood sugar being too high or too low.  please keep a record of the readings and bring it to your next appointment here.  please call us sooner if your blood sugar goes below 70, or if it stays over 200.   Increase novolog to 3x a day (just before each meal) 09-10-79 units.   Please continue the same lantus for now.   Please come back for a follow-up appointment in 1 month.     Refer to a surgery specialist.  you will receive a phone call, about a day and time for an informational meeting.

## 2012-08-09 ENCOUNTER — Other Ambulatory Visit: Payer: Self-pay | Admitting: Family Medicine

## 2012-08-09 NOTE — Telephone Encounter (Signed)
Call in one year supply  

## 2012-08-10 ENCOUNTER — Telehealth: Payer: Self-pay | Admitting: Endocrinology

## 2012-08-10 MED ORDER — INSULIN ASPART 100 UNIT/ML ~~LOC~~ SOLN: SUBCUTANEOUS | Status: DC

## 2012-08-10 MED ORDER — INSULIN GLARGINE 100 UNIT/ML ~~LOC~~ SOLN: 50.0000 [IU] | Freq: Two times a day (BID) | SUBCUTANEOUS | Status: DC

## 2012-08-10 NOTE — Telephone Encounter (Signed)
Please refill both prn 

## 2012-08-10 NOTE — Telephone Encounter (Signed)
Rx sent to [facility], called pt to informed, pt not available, informed to callback later.

## 2012-08-10 NOTE — Telephone Encounter (Signed)
Pt informed rx sent to pharmacy.

## 2012-08-10 NOTE — Telephone Encounter (Signed)
Caller: Natausha/Patient; Phone: [phone]; Reason for Call: Patient calling to see if Dr.  Everardo All would give her new prescriptions for Lantus and Novolog.  Also wanted to let Dr.  Everardo All know blood sugar has been under 200 most mornings.  She uses [facility] [phone].  Thanks

## 2012-08-18 ENCOUNTER — Encounter: Payer: Self-pay | Admitting: Family Medicine

## 2012-08-18 ENCOUNTER — Ambulatory Visit (INDEPENDENT_AMBULATORY_CARE_PROVIDER_SITE_OTHER): Payer: BC Managed Care – PPO | Admitting: Family Medicine

## 2012-08-18 ENCOUNTER — Telehealth: Payer: Self-pay | Admitting: Family Medicine

## 2012-08-18 VITALS — BP 130/70 | HR 92 | Temp 98.6°F | Wt 209.0 lb

## 2012-08-18 DIAGNOSIS — J329 Chronic sinusitis, unspecified: Secondary | ICD-10-CM

## 2012-08-18 MED ORDER — LEVOFLOXACIN 500 MG PO TABS: 500.0000 mg | ORAL_TABLET | Freq: Every day | ORAL | Status: DC

## 2012-08-18 NOTE — Progress Notes (Signed)
  Subjective:    Patient ID: Bonnie Gonzalez, female    DOB: Feb 01, 1956, 56 y.o.   MRN: [id number]  HPI Here for 2 weeks of sinus pressure, PND, ST, and coughing up green sputum.    Review of Systems  Constitutional: Negative.   HENT: Positive for congestion, postnasal drip and sinus pressure.   Eyes: Negative.   Respiratory: Positive for cough.        Objective:   Physical Exam  Constitutional: She appears well-developed and well-nourished.  HENT:  Right Ear: External ear normal.  Left Ear: External ear normal.  Nose: Nose normal.  Mouth/Throat: Oropharynx is clear and moist.  Eyes: Conjunctivae normal are normal.  Pulmonary/Chest: Effort normal and breath sounds normal.  Lymphadenopathy:    She has no cervical adenopathy.          Assessment & Plan:  Add Mucinex

## 2012-08-18 NOTE — Telephone Encounter (Signed)
Caller: [name]/Patient; Patient Name: Bonnie Gonzalez, Bonnie Gonzalez; PCP: Gershon Crane [facility]); Best Callback Phone Number: [phone]; Reason for call: Cough/Congestion.  Stuffy nose with green productive cough.  Onset: 08/04/12.  Afebrile.  FBS 146. Mild wheezing present.  Advised to see MD now for new or worsening breathng problems per Diabetes Respiratory Problems guideline.  Appointment scheduled for 08/18/12 at 1500 with Dr Clent Ridges.

## 2012-08-26 ENCOUNTER — Other Ambulatory Visit: Payer: Self-pay | Admitting: Family Medicine

## 2012-08-27 NOTE — Telephone Encounter (Signed)
Call in #30 with 5 rf 

## 2012-08-31 ENCOUNTER — Ambulatory Visit: Payer: BC Managed Care – PPO | Admitting: Endocrinology

## 2012-09-09 ENCOUNTER — Other Ambulatory Visit: Payer: Self-pay | Admitting: Family Medicine

## 2012-09-10 NOTE — Telephone Encounter (Signed)
Pt called to check on status of getting refill of levofloxacin (LEVAQUIN) 500 MG tablet for [facility]us [facility]. [facility] [place], [place]

## 2012-09-10 NOTE — Telephone Encounter (Signed)
Per Dr. Clent Ridges, okay to give a refill. I sent e-scribe and spoke with pt.

## 2012-09-23 ENCOUNTER — Telehealth: Payer: Self-pay | Admitting: *Deleted

## 2012-09-23 NOTE — Telephone Encounter (Signed)
Pt called to report that her CBG's have been fluctuating from lowest 152-400 over couple of weeks. She is asking for MD's advisement regarding insulin adjustment (call pt if more info is needed). Pt also states that [facility] Medication Assistance Program will be sending information to office for help with medications since pt has recently lost her job.

## 2012-09-23 NOTE — Telephone Encounter (Signed)
Please advise ov 

## 2012-09-24 NOTE — Telephone Encounter (Signed)
Sorry, if you can't come for appointments, i can't be of any help to you.

## 2012-09-24 NOTE — Telephone Encounter (Signed)
Pt called back stating that she has no insurance and cannot afford an appt and her meds. Please advise.

## 2012-10-15 ENCOUNTER — Telehealth: Payer: Self-pay | Admitting: Family Medicine

## 2012-10-15 NOTE — Telephone Encounter (Signed)
Refill request for Nexium 40 mg & Crestor 20 mg and send to [facility] ( 90 day supply ) I did not see the Crestor in pt's chart, can I fill both of these?

## 2012-10-15 NOTE — Telephone Encounter (Signed)
Please call in both for one year (make sure she stops the Lipitor)

## 2012-10-18 ENCOUNTER — Telehealth: Payer: Self-pay | Admitting: Family Medicine

## 2012-10-18 MED ORDER — ROSUVASTATIN CALCIUM 20 MG PO TABS: 20.0000 mg | ORAL_TABLET | Freq: Every day | ORAL | Status: DC

## 2012-10-18 MED ORDER — ESOMEPRAZOLE MAGNESIUM 40 MG PO CPDR: 40.0000 mg | DELAYED_RELEASE_CAPSULE | Freq: Every day | ORAL | Status: DC

## 2012-10-18 MED ORDER — LEVOTHYROXINE SODIUM 150 MCG PO TABS: 150.0000 ug | ORAL_TABLET | Freq: Every day | ORAL | Status: DC

## 2012-10-18 NOTE — Telephone Encounter (Signed)
I printed both scripts and faxed to the [facility] pharmacy.

## 2012-10-18 NOTE — Telephone Encounter (Signed)
Refill request for Synthroid 150 mcg and send to [facility] pharmacy ( 90 day supply ). I did print script and faxed.

## 2012-10-21 ENCOUNTER — Other Ambulatory Visit: Payer: Self-pay | Admitting: *Deleted

## 2012-10-21 ENCOUNTER — Telehealth: Payer: Self-pay | Admitting: *Deleted

## 2012-10-21 MED ORDER — INSULIN ASPART 100 UNIT/ML ~~LOC~~ SOLN: SUBCUTANEOUS | Status: DC

## 2012-10-21 NOTE — Telephone Encounter (Signed)
PHARMACY CLARIFICATION FAXED TO MAP OFFICE PHARMACIST FOR PATIENT INSULIN AT [facility].Marland Kitchen

## 2012-10-21 NOTE — Telephone Encounter (Signed)
PATIENT NOTIFIED OF INSULIN SENT TO PHARMACIST AT MAP AT [facility]. PATIENT AWARE OF NEED TO SCHEDULE APPT. WITH DR. Everardo All . PATIENT STATES SHE IS STILL OUT OF WORK AND HAS NO MONEY TO PAY FOR A OFFICE VISIT AT THIS TIME BUT WILL WHEN SHE IS ABLE FINANCIALLY. PATIENT NOTIFIED OF NEW PHONE # AND ADDRESS OF DR. ELLISON LOCATION NOW.

## 2012-10-22 ENCOUNTER — Other Ambulatory Visit: Payer: Self-pay | Admitting: *Deleted

## 2012-10-22 MED ORDER — INSULIN GLARGINE 100 UNIT/ML ~~LOC~~ SOLN: 50.0000 [IU] | Freq: Two times a day (BID) | SUBCUTANEOUS | Status: DC

## 2012-10-22 NOTE — Telephone Encounter (Signed)
rx printed for patient for insulin Lantus solostar flex pen to be faxed to [facility] for the Medication Assistance Program.

## 2012-11-26 ENCOUNTER — Telehealth: Payer: Self-pay | Admitting: Family Medicine

## 2012-11-26 NOTE — Telephone Encounter (Signed)
[facility] called and wanted to make sure they could give Vistaril 25 mg capsule instead of the Atarax 25 mg. Per Dr. Clent Ridges okay to give and I spoke with pharmacy.

## 2012-12-17 ENCOUNTER — Telehealth: Payer: Self-pay | Admitting: Family Medicine

## 2012-12-17 NOTE — Telephone Encounter (Signed)
Refill request for Alprazolam 0.5 mg take 1 po tid and last here on 08/18/12. Please send to [facility], phone is [phone] and fax is [phone]

## 2012-12-20 MED ORDER — ALPRAZOLAM 0.5 MG PO TABS: 0.5000 mg | ORAL_TABLET | Freq: Three times a day (TID) | ORAL | Status: DC | PRN

## 2012-12-20 NOTE — Telephone Encounter (Signed)
Script was printed and faxed. 

## 2012-12-20 NOTE — Telephone Encounter (Signed)
Call in #90 with 5 rf 

## 2013-02-22 ENCOUNTER — Telehealth: Payer: Self-pay | Admitting: Family Medicine

## 2013-02-22 MED ORDER — ROSUVASTATIN CALCIUM 20 MG PO TABS: 20.0000 mg | ORAL_TABLET | Freq: Every day | ORAL | Status: DC

## 2013-02-22 MED ORDER — ESOMEPRAZOLE MAGNESIUM 40 MG PO CPDR: 40.0000 mg | DELAYED_RELEASE_CAPSULE | Freq: Every day | ORAL | Status: DC

## 2013-02-22 NOTE — Telephone Encounter (Signed)
Refill request for Crestor & Nexium and I did fax to [facility] [phone]

## 2013-03-09 ENCOUNTER — Ambulatory Visit (INDEPENDENT_AMBULATORY_CARE_PROVIDER_SITE_OTHER): Payer: PRIVATE HEALTH INSURANCE | Admitting: Family Medicine

## 2013-03-09 ENCOUNTER — Encounter: Payer: Self-pay | Admitting: Family Medicine

## 2013-03-09 VITALS — BP 138/78 | HR 91 | Temp 98.6°F | Wt 216.0 lb

## 2013-03-09 DIAGNOSIS — F3289 Other specified depressive episodes: Secondary | ICD-10-CM

## 2013-03-09 DIAGNOSIS — F411 Generalized anxiety disorder: Secondary | ICD-10-CM

## 2013-03-09 DIAGNOSIS — E119 Type 2 diabetes mellitus without complications: Secondary | ICD-10-CM

## 2013-03-09 DIAGNOSIS — E782 Mixed hyperlipidemia: Secondary | ICD-10-CM

## 2013-03-09 DIAGNOSIS — F329 Major depressive disorder, single episode, unspecified: Secondary | ICD-10-CM

## 2013-03-09 DIAGNOSIS — E039 Hypothyroidism, unspecified: Secondary | ICD-10-CM

## 2013-03-09 NOTE — Progress Notes (Signed)
  Subjective:    Patient ID: Bonnie Gonzalez, female    DOB: Feb 20, 1956, 57 y.o.   MRN: [id number]  HPI Here to ask for a referral to see Dr. Everardo All again for her thyroid and her diabetes. She lost her insurance for a while last year, and now she has medical insurance again. She was told his office would require a new referral. She feels well and has no complaints.    Review of Systems  Constitutional: Negative.   Respiratory: Negative.   Cardiovascular: Negative.        Objective:   Physical Exam  Constitutional: She appears well-developed and well-nourished.  Cardiovascular: Normal rate, regular rhythm, normal heart sounds and intact distal pulses.   Pulmonary/Chest: Effort normal and breath sounds normal.          Assessment & Plan:  We will refer her back to Dr. Everardo All. I reminded her of the importance of regular exercise.

## 2013-03-16 ENCOUNTER — Ambulatory Visit (INDEPENDENT_AMBULATORY_CARE_PROVIDER_SITE_OTHER): Payer: PRIVATE HEALTH INSURANCE | Admitting: Endocrinology

## 2013-03-16 ENCOUNTER — Encounter: Payer: Self-pay | Admitting: Endocrinology

## 2013-03-16 VITALS — BP 126/80 | HR 75 | Wt 217.0 lb

## 2013-03-16 DIAGNOSIS — E119 Type 2 diabetes mellitus without complications: Secondary | ICD-10-CM

## 2013-03-16 LAB — HEMOGLOBIN A1C: Hgb A1c MFr Bld: 9.1 % — ABNORMAL HIGH (ref 4.6–6.5)

## 2013-03-16 LAB — MICROALBUMIN / CREATININE URINE RATIO
Creatinine,U: 56 mg/dL
Microalb Creat Ratio: 0.7 mg/g (ref 0.0–30.0)

## 2013-03-16 NOTE — Progress Notes (Signed)
Subjective:    Patient ID: Bonnie Gonzalez, female    DOB: 1956-10-18, 57 y.o.   MRN: [id number]  HPI Pt returns for f/u of insulin-requiring DM (dx'ed 2010; no known complications; she has never had severe hypoglycemia or DKA).  Pt returns today, having regained her insurance.  pt states she feels well in general.  she brings a record of her cbg's which i have reviewed today.  She had a cbg as 61 (3 am, after she took her supper novolog long after dinner).  It is otherwise in the 100's-200's.  There is no trend throughout the day.   Past Medical History  Diagnosis Date  . Depression   . GERD (gastroesophageal reflux disease)   . Anxiety   . Allergy   . Diabetes mellitus   . Tuberculosis     hydrothyroidism  . Insomnia   . IBS (irritable bowel syndrome)     Past Surgical History  Procedure Laterality Date  . Benign tumor       removed from groin  . Wrist arthrocentesis N/A 2001    History   Social History  . Marital Status: Divorced    Spouse Name: N/A    Number of Children: N/A  . Years of Education: N/A   Occupational History  . Not on file.   Social History Main Topics  . Smoking status: Former Games developer  . Smokeless tobacco: Never Used  . Alcohol Use: No  . Drug Use: No  . Sexually Active: Not on file   Other Topics Concern  . Not on file   Social History Narrative  . No narrative on file    Current Outpatient Prescriptions on File Prior to Visit  Medication Sig Dispense Refill  . ALPRAZolam (XANAX) 0.5 MG tablet Take 1 tablet (0.5 mg total) by mouth 3 (three) times daily as needed.  90 tablet  5  . benzonatate (TESSALON) 100 MG capsule TAKE ONE CAPSULE BY MOUTH EVERY 4 HOURS AS NEEDED FOR COUGH  60 capsule  11  . esomeprazole (NEXIUM) 40 MG capsule Take 1 capsule (40 mg total) by mouth daily before breakfast.  90 capsule  1  . halobetasol (ULTRAVATE) 0.05 % cream       . hydrOXYzine (ATARAX/VISTARIL) 25 MG tablet TAKE 1 TABLET BY MOUTH 3 TIMES A DAY AS NEEDED   270 tablet  2  . ibuprofen (ADVIL,MOTRIN) 600 MG tablet Take 600 mg by mouth every 6 (six) hours as needed.        . insulin aspart (NOVOLOG) 100 UNIT/ML injection Inject subcutaneously 3x a day (just before each meal) 09-10-79 units  30 mL  5  . insulin glargine (LANTUS) 100 UNIT/ML injection Inject 50 Units into the skin 2 (two) times daily.  30 mL  5  . Lactobacillus (ACIDOPHILUS) CAPS Take 1 capsule by mouth daily.      Marland Kitchen levothyroxine (SYNTHROID, LEVOTHROID) 150 MCG tablet Take 1 tablet (150 mcg total) by mouth daily.  90 tablet  1  . rosuvastatin (CRESTOR) 20 MG tablet Take 1 tablet (20 mg total) by mouth daily.  90 tablet  1  . sertraline (ZOLOFT) 50 MG tablet TAKE 1 TABLET BY MOUTH EVERY DAY  30 tablet  11  . temazepam (RESTORIL) 30 MG capsule TAKE ONE CAPSULE BY MOUTH AT BEDTIME AS NEEDED  30 capsule  5  . TRUETEST TEST test strip USE AS DIRECTED  100 each  3   No current facility-administered medications on file prior to  visit.    Allergies  Allergen Reactions  . Codeine   . Fluoxetine Hcl   . Hydrocodone-Acetaminophen   . Oxycodone-Acetaminophen   . Penicillins   . Tramadol Hcl     Family History  Problem Relation Age of Onset  . Asthma      fhx  . Depression      fhx  . Diabetes      fhx  . Parkinsonism      fhx  . Lupus      fhx    BP 126/80  Pulse 75  Wt 217 lb (98.431 kg)  BMI 42.38 kg/m2  SpO2 96%   Review of Systems Denies LOC    Objective:   Physical Exam VITAL SIGNS:  See vs page GENERAL: no distress Pulses: dorsalis pedis intact bilat.   Feet: no deformity.  no ulcer on the feet.  feet are of normal color and temp.  no edema Neuro: sensation is intact to touch on the feet.        Assessment & Plan:  DM: rx limited by variable cbg's

## 2013-03-16 NOTE — Patient Instructions (Addendum)
check your blood sugar twice a day.  vary the time of day when you check, between before the 3 meals, and at bedtime.  also check if you have symptoms of your blood sugar being too high or too low.  please keep a record of the readings and bring it to your next appointment here.  please call us sooner if your blood sugar goes below 70, or if it stays over 200.   pending the test results, please continue the same insulins.  Please come back for a follow-up appointment in 3 months.

## 2013-03-22 ENCOUNTER — Telehealth: Payer: Self-pay | Admitting: Endocrinology

## 2013-03-22 NOTE — Telephone Encounter (Signed)
Left message pt to call back if any questions

## 2013-03-22 NOTE — Telephone Encounter (Signed)
Check cbg if this happens.  If not low, call dr fry's office

## 2013-03-22 NOTE — Telephone Encounter (Signed)
Pt c/o dizziness and light-headedness. Says her BP was 144/90 this AM which is high for her. Please call patient at [phone] / Oneita Kras.

## 2013-03-30 ENCOUNTER — Telehealth: Payer: Self-pay

## 2013-03-30 ENCOUNTER — Other Ambulatory Visit: Payer: Self-pay | Admitting: *Deleted

## 2013-03-30 MED ORDER — INSULIN GLARGINE 100 UNIT/ML ~~LOC~~ SOLN: 50.0000 [IU] | Freq: Two times a day (BID) | SUBCUTANEOUS | Status: DC

## 2013-03-30 NOTE — Telephone Encounter (Signed)
Pt calling for refill of her lantus.  At her last visit it was increased so she will run out before her next appt.  Requesting rx to [facility] with her increased dose.

## 2013-03-31 ENCOUNTER — Other Ambulatory Visit: Payer: Self-pay

## 2013-03-31 ENCOUNTER — Telehealth: Payer: Self-pay | Admitting: Family Medicine

## 2013-03-31 MED ORDER — INSULIN GLARGINE 100 UNIT/ML ~~LOC~~ SOLN: 50.0000 [IU] | Freq: Two times a day (BID) | SUBCUTANEOUS | Status: DC

## 2013-03-31 NOTE — Telephone Encounter (Signed)
Refill request for Temazepam 30 mg take 1 po qhs prn. 

## 2013-04-01 ENCOUNTER — Encounter: Payer: Self-pay | Admitting: Endocrinology

## 2013-04-01 ENCOUNTER — Ambulatory Visit (INDEPENDENT_AMBULATORY_CARE_PROVIDER_SITE_OTHER): Payer: PRIVATE HEALTH INSURANCE | Admitting: Endocrinology

## 2013-04-01 VITALS — BP 124/70 | HR 84 | Wt 218.0 lb

## 2013-04-01 DIAGNOSIS — E119 Type 2 diabetes mellitus without complications: Secondary | ICD-10-CM

## 2013-04-01 MED ORDER — TEMAZEPAM 30 MG PO CAPS: 30.0000 mg | ORAL_CAPSULE | Freq: Every evening | ORAL | Status: DC | PRN

## 2013-04-01 NOTE — Patient Instructions (Addendum)
check your blood sugar twice a day.  vary the time of day when you check, between before the 3 meals, and at bedtime.  also check if you have symptoms of your blood sugar being too high or too low.  please keep a record of the readings and bring it to your next appointment here.  please call us sooner if your blood sugar goes below 70, or if it stays over 200.   Please continue the same lantus.   Add novolog, 20 units with the evening meal.   Please come back for a follow-up appointment in 3 months.

## 2013-04-01 NOTE — Progress Notes (Signed)
Subjective:    Patient ID: Bonnie Gonzalez, female    DOB: 1955-12-14, 57 y.o.   MRN: [id number]  HPI Pt returns for f/u of insulin-requiring DM (dx'ed 2010; no known complications; she has never had severe hypoglycemia or DKA; therapy has been limited by pt's need for a simple regimen).  she brings a record of her cbg's which i have reviewed today.  It varies from 153-401.  It is in general higher as the day goes on.  Past Medical History  Diagnosis Date  . Depression   . GERD (gastroesophageal reflux disease)   . Anxiety   . Allergy   . Diabetes mellitus   . Tuberculosis     hydrothyroidism  . Insomnia   . IBS (irritable bowel syndrome)     Past Surgical History  Procedure Laterality Date  . Benign tumor       removed from groin  . Wrist arthrocentesis N/A 2001    History   Social History  . Marital Status: Divorced    Spouse Name: N/A    Number of Children: N/A  . Years of Education: N/A   Occupational History  . Not on file.   Social History Main Topics  . Smoking status: Former Games developer  . Smokeless tobacco: Never Used  . Alcohol Use: No  . Drug Use: No  . Sexually Active: Not on file   Other Topics Concern  . Not on file   Social History Narrative  . No narrative on file    Current Outpatient Prescriptions on File Prior to Visit  Medication Sig Dispense Refill  . ALPRAZolam (XANAX) 0.5 MG tablet Take 1 tablet (0.5 mg total) by mouth 3 (three) times daily as needed.  90 tablet  5  . benzonatate (TESSALON) 100 MG capsule TAKE ONE CAPSULE BY MOUTH EVERY 4 HOURS AS NEEDED FOR COUGH  60 capsule  11  . esomeprazole (NEXIUM) 40 MG capsule Take 1 capsule (40 mg total) by mouth daily before breakfast.  90 capsule  1  . halobetasol (ULTRAVATE) 0.05 % cream       . hydrOXYzine (ATARAX/VISTARIL) 25 MG tablet TAKE 1 TABLET BY MOUTH 3 TIMES A DAY AS NEEDED  270 tablet  2  . ibuprofen (ADVIL,MOTRIN) 600 MG tablet Take 600 mg by mouth every 6 (six) hours as needed.         . Lactobacillus (ACIDOPHILUS) CAPS Take 1 capsule by mouth daily.      Marland Kitchen levothyroxine (SYNTHROID, LEVOTHROID) 150 MCG tablet Take 1 tablet (150 mcg total) by mouth daily.  90 tablet  1  . rosuvastatin (CRESTOR) 20 MG tablet Take 1 tablet (20 mg total) by mouth daily.  90 tablet  1  . sertraline (ZOLOFT) 50 MG tablet TAKE 1 TABLET BY MOUTH EVERY DAY  30 tablet  11  . temazepam (RESTORIL) 30 MG capsule Take 1 capsule (30 mg total) by mouth at bedtime as needed for sleep.  30 capsule  5  . TRUETEST TEST test strip USE AS DIRECTED  100 each  3   No current facility-administered medications on file prior to visit.    Allergies  Allergen Reactions  . Codeine   . Fluoxetine Hcl   . Hydrocodone-Acetaminophen   . Oxycodone-Acetaminophen   . Penicillins   . Tramadol Hcl     Family History  Problem Relation Age of Onset  . Asthma      fhx  . Depression      fhx  .  Diabetes      fhx  . Parkinsonism      fhx  . Lupus      fhx    BP 124/70  Pulse 84  Wt 218 lb (98.884 kg)  BMI 42.58 kg/m2  SpO2 96%  Review of Systems denies hypoglycemia.    Objective:   Physical Exam VITAL SIGNS:  See vs page GENERAL: no distress.   SKIN:  Insulin injection sites at the anterior abdomen are normal.         Assessment & Plan:  DM: Based on the pattern of her cbg's, she needs some adjustment in her therapy

## 2013-04-01 NOTE — Telephone Encounter (Signed)
Okay for 6 months 

## 2013-04-01 NOTE — Telephone Encounter (Signed)
I called in script 

## 2013-04-05 ENCOUNTER — Telehealth: Payer: Self-pay | Admitting: Family Medicine

## 2013-04-05 ENCOUNTER — Telehealth: Payer: Self-pay | Admitting: Endocrinology

## 2013-04-05 NOTE — Telephone Encounter (Signed)
PT called and stated that she needed a refill of her temazepam (RESTORIL) 30 MG capsule. I see that you called it in on Friday, but they have no record of that at [facility] pharmacy. Please assist.

## 2013-04-05 NOTE — Telephone Encounter (Signed)
Pharmacist advised per Dr. George Hugh avs on 04/01/13

## 2013-04-05 NOTE — Telephone Encounter (Signed)
Pt has requested we fax current insulin dose to [facility]. She says they have the 50u but not the 100u. Fax is [phone]. Please call patient w/ any questions - Sherri

## 2013-04-06 NOTE — Telephone Encounter (Signed)
I called in script again.

## 2013-04-29 ENCOUNTER — Telehealth: Payer: Self-pay | Admitting: Family Medicine

## 2013-04-29 MED ORDER — LEVOFLOXACIN 500 MG PO TABS: 500.0000 mg | ORAL_TABLET | Freq: Every day | ORAL | Status: DC

## 2013-04-29 NOTE — Telephone Encounter (Signed)
Pt wanted appt this afternoon w/ Dr Clent Ridges for sinus inf. Pt refused another provider. Pt said MD called script for her in the past, and would like for me to ask if he could for her. Pharm: [facility]:  [phone]  Fax:  [phone]

## 2013-04-29 NOTE — Telephone Encounter (Signed)
Call in Levaquin 500 mg a day for 10 days  

## 2013-04-29 NOTE — Telephone Encounter (Signed)
I sent script e-scribe and spoke with pt. 

## 2013-05-10 ENCOUNTER — Telehealth: Payer: Self-pay | Admitting: Family Medicine

## 2013-05-10 NOTE — Telephone Encounter (Signed)
Pt has made her bone density and mammogram appt at [facility] for Riana 18. Yolanda Bonine advised pt she needed an order. Pt would like to know if you could send order to them.

## 2013-05-10 NOTE — Telephone Encounter (Signed)
Note was written, please fax it

## 2013-05-10 NOTE — Telephone Encounter (Signed)
I faxed script to [phone]

## 2013-05-26 ENCOUNTER — Telehealth: Payer: Self-pay | Admitting: Family Medicine

## 2013-05-26 NOTE — Telephone Encounter (Signed)
Her recent DEXA shows worsening osteoporosis. Start on Fosamax 70 mg weekly, call in one year supply. Repeat DEXA in 2 years

## 2013-05-27 MED ORDER — ALENDRONATE SODIUM 70 MG PO TABS: 70.0000 mg | ORAL_TABLET | ORAL | Status: DC

## 2013-05-27 NOTE — Telephone Encounter (Signed)
I spoke with pt and sent script e-scribe. 

## 2013-06-07 ENCOUNTER — Encounter: Payer: Self-pay | Admitting: Endocrinology

## 2013-06-07 ENCOUNTER — Encounter: Payer: Self-pay | Admitting: Family Medicine

## 2013-07-11 ENCOUNTER — Ambulatory Visit (INDEPENDENT_AMBULATORY_CARE_PROVIDER_SITE_OTHER): Payer: PRIVATE HEALTH INSURANCE | Admitting: Endocrinology

## 2013-07-11 ENCOUNTER — Encounter: Payer: Self-pay | Admitting: Endocrinology

## 2013-07-11 VITALS — BP 136/80 | HR 80 | Ht 60.0 in | Wt 221.0 lb

## 2013-07-11 DIAGNOSIS — E119 Type 2 diabetes mellitus without complications: Secondary | ICD-10-CM

## 2013-07-11 NOTE — Progress Notes (Signed)
Subjective:    Patient ID: Bonnie Gonzalez, female    DOB: [birth year], 57 y.o.   MRN: [id number]  HPI  Pt returns for f/u of insulin-requiring DM (dx'ed 2010; she has mild if any neuropathy of the lower extremities; no known associated complications; she has never had severe hypoglycemia or DKA; therapy has been limited by pt's need for a simple regimen).  she brings a record of her cbg's which i have reviewed today.  It varies from 129-350.  It is in general highest at hs.   Past Medical History  Diagnosis Date  . Depression   . GERD (gastroesophageal reflux disease)   . Anxiety   . Allergy   . Diabetes mellitus   . Tuberculosis     hydrothyroidism  . Insomnia   . IBS (irritable bowel syndrome)     Past Surgical History  Procedure Laterality Date  . Benign tumor       removed from groin  . Wrist arthrocentesis N/A 2001    History   Social History  . Marital Status: Divorced    Spouse Name: N/A    Number of Children: N/A  . Years of Education: N/A   Occupational History  . Not on file.   Social History Main Topics  . Smoking status: Former Games developer  . Smokeless tobacco: Never Used  . Alcohol Use: No  . Drug Use: No  . Sexual Activity: Not on file   Other Topics Concern  . Not on file   Social History Narrative  . No narrative on file    Current Outpatient Prescriptions on File Prior to Visit  Medication Sig Dispense Refill  . alendronate (FOSAMAX) 70 MG tablet Take 1 tablet (70 mg total) by mouth every 7 (seven) days. Take with a full glass of water on an empty stomach.  4 tablet  11  . ALPRAZolam (XANAX) 0.5 MG tablet Take 1 tablet (0.5 mg total) by mouth 3 (three) times daily as needed.  90 tablet  5  . benzonatate (TESSALON) 100 MG capsule TAKE ONE CAPSULE BY MOUTH EVERY 4 HOURS AS NEEDED FOR COUGH  60 capsule  11  . esomeprazole (NEXIUM) 40 MG capsule Take 1 capsule (40 mg total) by mouth daily before breakfast.  90 capsule  1  . halobetasol (ULTRAVATE) 0.05  % cream       . hydrOXYzine (ATARAX/VISTARIL) 25 MG tablet TAKE 1 TABLET BY MOUTH 3 TIMES A DAY AS NEEDED  270 tablet  2  . ibuprofen (ADVIL,MOTRIN) 600 MG tablet Take 600 mg by mouth every 6 (six) hours as needed.        . insulin glargine (LANTUS) 100 UNIT/ML injection Inject 200 Units into the skin daily.      . Lactobacillus (ACIDOPHILUS) CAPS Take 1 capsule by mouth daily.      Marland Kitchen levofloxacin (LEVAQUIN) 500 MG tablet Take 1 tablet (500 mg total) by mouth daily.  10 tablet  0  . levothyroxine (SYNTHROID, LEVOTHROID) 150 MCG tablet Take 1 tablet (150 mcg total) by mouth daily.  90 tablet  1  . rosuvastatin (CRESTOR) 20 MG tablet Take 1 tablet (20 mg total) by mouth daily.  90 tablet  1  . sertraline (ZOLOFT) 50 MG tablet TAKE 1 TABLET BY MOUTH EVERY DAY  30 tablet  11  . temazepam (RESTORIL) 30 MG capsule Take 1 capsule (30 mg total) by mouth at bedtime as needed for sleep.  30 capsule  5  . TRUETEST TEST  test strip USE AS DIRECTED  100 each  3   No current facility-administered medications on file prior to visit.    Allergies  Allergen Reactions  . Codeine   . Fluoxetine Hcl   . Hydrocodone-Acetaminophen   . Oxycodone-Acetaminophen   . Penicillins   . Tramadol Hcl     Family History  Problem Relation Age of Onset  . Asthma      fhx  . Depression      fhx  . Diabetes      fhx  . Parkinsonism      fhx  . Lupus      fhx   BP 136/80  Pulse 80  Ht 5' (1.524 m)  Wt 221 lb (100.245 kg)  BMI 43.16 kg/m2  SpO2 95%  Review of Systems denies hypoglycemia.  She has gained weight.    Objective:   Physical Exam VITAL SIGNS:  See vs page.   GENERAL: no distress.   Lab Results  Component Value Date   HGBA1C 9.1* 03/16/2013      Assessment & Plan:  DM: she needs increased rx.  This insulin regimen was chosen from multiple options, as it best matches his insulin to his changing requirements throughout the day.  The benefits of glycemic control must be weighed against the  risks of hypoglycemia.

## 2013-07-11 NOTE — Patient Instructions (Addendum)
check your blood sugar twice a day.  vary the time of day when you check, between before the 3 meals, and at bedtime.  also check if you have symptoms of your blood sugar being too high or too low.  please keep a record of the readings and bring it to your next appointment here.  please call us sooner if your blood sugar goes below 70, or if it stays over 200.   Please continue the same lantus.   Please increase the novolog to 40 units with the evening meal.   Please come back for a follow-up appointment in 6 weeks.

## 2013-08-04 ENCOUNTER — Telehealth: Payer: Self-pay | Admitting: Endocrinology

## 2013-08-04 NOTE — Telephone Encounter (Signed)
Call about high BS reading. Sherri

## 2013-08-05 NOTE — Telephone Encounter (Signed)
Pt states her cbg has been high at bedtime and before supper around 300's, please advise [phone]

## 2013-08-05 NOTE — Telephone Encounter (Signed)
Please increase novolog to 50 units qd, with supper

## 2013-08-05 NOTE — Telephone Encounter (Signed)
Left message

## 2013-08-18 ENCOUNTER — Telehealth: Payer: Self-pay | Admitting: Endocrinology

## 2013-08-18 NOTE — Telephone Encounter (Signed)
Rx called in to pharmacy. 

## 2013-08-19 ENCOUNTER — Other Ambulatory Visit: Payer: Self-pay | Admitting: Family Medicine

## 2013-08-22 ENCOUNTER — Ambulatory Visit (INDEPENDENT_AMBULATORY_CARE_PROVIDER_SITE_OTHER): Payer: PRIVATE HEALTH INSURANCE | Admitting: Endocrinology

## 2013-08-22 ENCOUNTER — Encounter: Payer: Self-pay | Admitting: Endocrinology

## 2013-08-22 VITALS — BP 130/70 | HR 90 | Wt 221.0 lb

## 2013-08-22 DIAGNOSIS — E119 Type 2 diabetes mellitus without complications: Secondary | ICD-10-CM

## 2013-08-22 NOTE — Progress Notes (Signed)
Subjective:    Patient ID: Bonnie Gonzalez, female    DOB: 05/21/1956, 57 y.o.   MRN: [id number]  HPI Pt returns for f/u of insulin-requiring DM (dx'ed 2010; she has mild if any neuropathy of the lower extremities; no known associated complications; she has never had severe hypoglycemia or DKA; therapy has been limited by pt's need for a simple regimen).  she brings a record of her cbg's which i have reviewed today.  It varies from 100-250.  It is in general higher as the day goes on.  pt states she feels well in general.  Since the supper novolog was increased to 50 units, hs-cbg's are much better.   Past Medical History  Diagnosis Date  . Depression   . GERD (gastroesophageal reflux disease)   . Anxiety   . Allergy   . Diabetes mellitus   . Tuberculosis     hydrothyroidism  . Insomnia   . IBS (irritable bowel syndrome)     Past Surgical History  Procedure Laterality Date  . Benign tumor       removed from groin  . Wrist arthrocentesis N/A 2001    History   Social History  . Marital Status: Divorced    Spouse Name: N/A    Number of Children: N/A  . Years of Education: N/A   Occupational History  . Not on file.   Social History Main Topics  . Smoking status: Former Games developer  . Smokeless tobacco: Never Used  . Alcohol Use: No  . Drug Use: No  . Sexual Activity: Not on file   Other Topics Concern  . Not on file   Social History Narrative  . No narrative on file    Current Outpatient Prescriptions on File Prior to Visit  Medication Sig Dispense Refill  . alendronate (FOSAMAX) 70 MG tablet Take 1 tablet (70 mg total) by mouth every 7 (seven) days. Take with a full glass of water on an empty stomach.  4 tablet  11  . ALPRAZolam (XANAX) 0.5 MG tablet Take 1 tablet (0.5 mg total) by mouth 3 (three) times daily as needed.  90 tablet  5  . esomeprazole (NEXIUM) 40 MG capsule Take 1 capsule (40 mg total) by mouth daily before breakfast.  90 capsule  1  . halobetasol  (ULTRAVATE) 0.05 % cream       . hydrOXYzine (ATARAX/VISTARIL) 25 MG tablet TAKE 1 TABLET BY MOUTH 3 TIMES A DAY AS NEEDED  270 tablet  2  . ibuprofen (ADVIL,MOTRIN) 600 MG tablet Take 600 mg by mouth every 6 (six) hours as needed.        . insulin aspart (NOVOLOG FLEXPEN) 100 UNIT/ML SOPN FlexPen Inject 50 Units into the skin daily with supper.       . insulin glargine (LANTUS) 100 UNIT/ML injection Inject 225 Units into the skin every morning.       . Lactobacillus (ACIDOPHILUS) CAPS Take 1 capsule by mouth daily.      Marland Kitchen levothyroxine (SYNTHROID, LEVOTHROID) 150 MCG tablet Take 1 tablet (150 mcg total) by mouth daily.  90 tablet  1  . rosuvastatin (CRESTOR) 20 MG tablet Take 1 tablet (20 mg total) by mouth daily.  90 tablet  1  . sertraline (ZOLOFT) 50 MG tablet TAKE ONE TABLET DAILY  30 tablet  6  . temazepam (RESTORIL) 30 MG capsule Take 1 capsule (30 mg total) by mouth at bedtime as needed for sleep.  30 capsule  5  .  TRUETEST TEST test strip USE AS DIRECTED  100 each  3  . VISTARIL 25 MG capsule TAKE ONE CAPSULE THREE DAILY AS NEEDED  270 capsule  0   No current facility-administered medications on file prior to visit.    Allergies  Allergen Reactions  . Codeine   . Fluoxetine Hcl   . Hydrocodone-Acetaminophen   . Oxycodone-Acetaminophen   . Penicillins   . Tramadol Hcl     Family History  Problem Relation Age of Onset  . Asthma      fhx  . Depression      fhx  . Diabetes      fhx  . Parkinsonism      fhx  . Lupus      fhx   BP 130/70  Pulse 90  Wt 221 lb (100.245 kg)  BMI 43.16 kg/m2  SpO2 95%  Review of Systems denies hypoglycemia and weight change.      Objective:   Physical Exam VITAL SIGNS:  See vs page GENERAL: no distress  Lab Results  Component Value Date   HGBA1C 9.9* 08/22/2013      Assessment & Plan:  DM: she needs much better control.   This insulin regimen was chosen from multiple options, for its simplicity.  The benefits of glycemic  control must be weighed against the risks of hypoglycemia.   Smoker: this accelerartes the chronic complications of DM. Depression: this complicates the rx of DM.

## 2013-08-22 NOTE — Patient Instructions (Addendum)
check your blood sugar twice a day.  vary the time of day when you check, between before the 3 meals, and at bedtime.  also check if you have symptoms of your blood sugar being too high or too low.  please keep a record of the readings and bring it to your next appointment here.  please call us sooner if your blood sugar goes below 70, or if it stays over 200.   blood tests are being requested for you today.  We'll contact you with results.   Please come back for a follow-up appointment in 4 months.

## 2013-08-23 LAB — HEMOGLOBIN A1C: Hgb A1c MFr Bld: 9.9 % — ABNORMAL HIGH (ref 4.6–6.5)

## 2013-09-08 ENCOUNTER — Other Ambulatory Visit: Payer: Self-pay | Admitting: Family Medicine

## 2013-09-08 NOTE — Telephone Encounter (Signed)
Call in #90 with 5 rf 

## 2013-09-09 ENCOUNTER — Telehealth: Payer: Self-pay | Admitting: Family Medicine

## 2013-09-09 MED ORDER — ROSUVASTATIN CALCIUM 20 MG PO TABS: 20.0000 mg | ORAL_TABLET | Freq: Every day | ORAL | Status: DC

## 2013-09-09 NOTE — Telephone Encounter (Signed)
Refill request for Crestor 20 mg and I did sent script e-scribe to [facility].

## 2013-10-06 ENCOUNTER — Other Ambulatory Visit: Payer: Self-pay | Admitting: Family Medicine

## 2013-10-21 ENCOUNTER — Telehealth: Payer: Self-pay | Admitting: Family Medicine

## 2013-10-21 NOTE — Telephone Encounter (Signed)
Refill request for Levothyroxine 150 mcg take 1 po qd. Does pt need any lab work before we refill this?

## 2013-10-21 NOTE — Telephone Encounter (Signed)
Refill this for one year but she will need a TSH drawn the next time she comes in

## 2013-10-24 MED ORDER — LEVOTHYROXINE SODIUM 150 MCG PO TABS: 150.0000 ug | ORAL_TABLET | Freq: Every day | ORAL | Status: DC

## 2013-10-24 NOTE — Telephone Encounter (Signed)
I sent script to pharmacy. I tried to reach pt and no answer or option to leave a message.

## 2013-10-31 ENCOUNTER — Encounter: Payer: Self-pay | Admitting: Family Medicine

## 2013-10-31 ENCOUNTER — Ambulatory Visit (INDEPENDENT_AMBULATORY_CARE_PROVIDER_SITE_OTHER): Payer: PRIVATE HEALTH INSURANCE | Admitting: Family Medicine

## 2013-10-31 VITALS — BP 128/70 | HR 93 | Temp 98.4°F | Wt 224.0 lb

## 2013-10-31 DIAGNOSIS — N390 Urinary tract infection, site not specified: Secondary | ICD-10-CM

## 2013-10-31 DIAGNOSIS — J019 Acute sinusitis, unspecified: Secondary | ICD-10-CM

## 2013-10-31 LAB — POCT URINALYSIS DIPSTICK
Bilirubin, UA: NEGATIVE
Blood, UA: NEGATIVE
Glucose, UA: NEGATIVE
Ketones, UA: NEGATIVE
Nitrite, UA: NEGATIVE
Spec Grav, UA: 1.025
Urobilinogen, UA: 0.2

## 2013-10-31 MED ORDER — METHYLPREDNISOLONE ACETATE 80 MG/ML IJ SUSP
120.0000 mg | Freq: Once | INTRAMUSCULAR | Status: AC
  Administered 2013-10-31: 120 mg via INTRAMUSCULAR

## 2013-10-31 MED ORDER — LEVOFLOXACIN 500 MG PO TABS: 500.0000 mg | ORAL_TABLET | Freq: Every day | ORAL | Status: AC

## 2013-10-31 NOTE — Progress Notes (Signed)
Pre visit review using our clinic review tool, if applicable. No additional management support is needed unless otherwise documented below in the visit note. 

## 2013-10-31 NOTE — Progress Notes (Signed)
   Subjective:    Patient ID: Bonnie Gonzalez, female    DOB: [birth year], 57 y.o.   MRN: [id number]  HPI Here for 3 days of sinus pressure, PND, ST and coughing up greem sputum. She is taking Mucinex and benzonatate pills. Also she saw Urgent Care on 10-23-13 for a UTI, and she was given 3 days of Bactrim DS. Her urinary sx are now resolved.    Review of Systems  Constitutional: Negative.   HENT: Positive for congestion and postnasal drip.   Eyes: Negative.   Respiratory: Positive for cough.   Genitourinary: Negative.        Objective:   Physical Exam  Constitutional: She appears well-developed and well-nourished.  HENT:  Right Ear: External ear normal.  Left Ear: External ear normal.  Nose: Nose normal.  Mouth/Throat: Oropharynx is clear and moist.  Eyes: Conjunctivae are normal.  Pulmonary/Chest: Effort normal and breath sounds normal.  Abdominal: Soft. Bowel sounds are normal. She exhibits no distension and no mass. There is no tenderness. There is no rebound and no guarding.  Lymphadenopathy:    She has no cervical adenopathy.          Assessment & Plan:  Written off work today.

## 2013-10-31 NOTE — Addendum Note (Signed)
Addended by: Aniceto Boss A on: 10/31/2013 11:59 AM   Modules accepted: Orders

## 2013-10-31 NOTE — Addendum Note (Signed)
Addended by: Aniceto Boss A on: 10/31/2013 11:42 AM   Modules accepted: Orders

## 2013-11-03 ENCOUNTER — Other Ambulatory Visit: Payer: Self-pay | Admitting: Family Medicine

## 2013-11-03 NOTE — Telephone Encounter (Signed)
Call in #30 with 5 rf 

## 2013-11-17 ENCOUNTER — Other Ambulatory Visit: Payer: Self-pay | Admitting: Endocrinology

## 2013-12-06 ENCOUNTER — Encounter: Payer: Self-pay | Admitting: Family Medicine

## 2013-12-06 ENCOUNTER — Ambulatory Visit (INDEPENDENT_AMBULATORY_CARE_PROVIDER_SITE_OTHER)
Admission: RE | Admit: 2013-12-06 | Discharge: 2013-12-06 | Disposition: A | Discharge status: Home / Self Care | Payer: PRIVATE HEALTH INSURANCE | Source: Ambulatory Visit | Attending: Family Medicine | Admitting: Family Medicine

## 2013-12-06 ENCOUNTER — Ambulatory Visit (INDEPENDENT_AMBULATORY_CARE_PROVIDER_SITE_OTHER): Payer: PRIVATE HEALTH INSURANCE | Admitting: Family Medicine

## 2013-12-06 VITALS — BP 130/76 | HR 94 | Temp 98.9°F | Wt 230.0 lb

## 2013-12-06 DIAGNOSIS — M545 Low back pain, unspecified: Secondary | ICD-10-CM

## 2013-12-06 DIAGNOSIS — R413 Other amnesia: Secondary | ICD-10-CM

## 2013-12-06 NOTE — Progress Notes (Signed)
Pre visit review using our clinic review tool, if applicable. No additional management support is needed unless otherwise documented below in the visit note. 

## 2013-12-06 NOTE — Progress Notes (Signed)
   Subjective:    Patient ID: Bonnie Gonzalez, female    DOB: 05-09-1956, 58 y.o.   MRN: [id number]  HPI Here for several issues. First she has had progressive memory problems for the past year and she wants to see a neurologist. Her father had dementia and she is worried. Also she has had low back pain for several years but this has gotten worse in the past months. Sometimes the pain runs down the left leg. Using Ibuprofen.    Review of Systems  Constitutional: Negative.   Musculoskeletal: Positive for back pain.  Neurological: Negative.        Objective:   Physical Exam  Constitutional: She is oriented to person, place, and time. She appears well-developed and well-nourished. No distress.  Musculoskeletal:  Tender along the lower back with reduced ROM and some spasm   Neurological: She is alert and oriented to person, place, and time. She has normal reflexes. No cranial nerve deficit. She exhibits normal muscle tone. Coordination normal.  Psychiatric: She has a normal mood and affect. Her behavior is normal. Thought content normal.          Assessment & Plan:  Get lumbar spine films. Try Diclofenac and Flexeril. Refer to Neurology.

## 2013-12-07 ENCOUNTER — Telehealth: Payer: Self-pay | Admitting: Family Medicine

## 2013-12-07 NOTE — Telephone Encounter (Signed)
Pt was seen yesterday and medications was not call into her pharm

## 2013-12-08 NOTE — Telephone Encounter (Signed)
I spoke with pt and she said that 3 scripts were going to be sent to [facility], Mobic, anti inflammatory, & Flexeril. Also she wants to know if the pain in feet could be from gout?

## 2013-12-09 MED ORDER — DICLOFENAC SODIUM 75 MG PO TBEC: 75.0000 mg | DELAYED_RELEASE_TABLET | Freq: Two times a day (BID) | ORAL | Status: DC

## 2013-12-09 MED ORDER — CYCLOBENZAPRINE HCL 10 MG PO TABS: 10.0000 mg | ORAL_TABLET | Freq: Three times a day (TID) | ORAL | Status: DC | PRN

## 2013-12-09 NOTE — Telephone Encounter (Signed)
Pt is waiting on rx to be called in to her pharmacy and wants a phone call back asap about her x rays. Pt states she understands this is flu season but she feels that she is being pushed to the side.  I reassured her we are working hard to take care of all of our patients including her. Please advise.

## 2013-12-09 NOTE — Telephone Encounter (Signed)
I faxed both scripts and spoke with pt, also gave results from xray.

## 2013-12-09 NOTE — Telephone Encounter (Signed)
Tell her I had intended to send her in two medications, and we will do so now. Call in Flexeril 10 mg tid #90 with 5 rf, also Diclofenac 75 mg bid, #60 with 5 rf. I do not think she had gout

## 2013-12-12 ENCOUNTER — Other Ambulatory Visit: Payer: Self-pay | Admitting: Family Medicine

## 2014-01-03 ENCOUNTER — Ambulatory Visit (INDEPENDENT_AMBULATORY_CARE_PROVIDER_SITE_OTHER): Payer: PRIVATE HEALTH INSURANCE | Admitting: Neurology

## 2014-01-03 ENCOUNTER — Encounter: Payer: Self-pay | Admitting: Neurology

## 2014-01-03 VITALS — BP 124/64 | HR 76 | Temp 98.5°F | Wt 233.1 lb

## 2014-01-03 DIAGNOSIS — F32A Depression, unspecified: Secondary | ICD-10-CM

## 2014-01-03 DIAGNOSIS — F329 Major depressive disorder, single episode, unspecified: Secondary | ICD-10-CM

## 2014-01-03 DIAGNOSIS — R413 Other amnesia: Secondary | ICD-10-CM

## 2014-01-03 DIAGNOSIS — F3289 Other specified depressive episodes: Secondary | ICD-10-CM

## 2014-01-03 LAB — VITAMIN B12: Vitamin B-12: 918 pg/mL — ABNORMAL HIGH (ref 211–911)
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
## 2014-01-03 NOTE — Patient Instructions (Addendum)
1.  MRI brain without contrast. We have scheduled you at [facility] for your MRI on 01/10/14 at 8:00 am. Please arrive 15 minutes prior and go to [facility] blood work today. Please go to [facility] on the first floor of our building.  3.  Referral to behavior therapist for coping mechanisms. They will call you directly with an appt. If you do not hear from them, please call [phone]. 4.  Encouraged maintaining active lifestyle, exercise, engaging in cognitive stimulating activities (crossword puzzles, reading, etc), and having scheduled sleep habits 5.  Return to clinic in 24-months

## 2014-01-06 ENCOUNTER — Telehealth (HOSPITAL_COMMUNITY): Payer: Self-pay

## 2014-01-06 ENCOUNTER — Other Ambulatory Visit: Payer: Self-pay | Admitting: Family Medicine

## 2014-01-10 ENCOUNTER — Telehealth: Payer: Self-pay | Admitting: Neurology

## 2014-01-10 ENCOUNTER — Ambulatory Visit (HOSPITAL_COMMUNITY)
Admission: RE | Admit: 2014-01-10 | Discharge: 2014-01-10 | Disposition: A | Discharge status: Home / Self Care | Payer: PRIVATE HEALTH INSURANCE | Source: Ambulatory Visit | Attending: Neurology | Admitting: Neurology

## 2014-01-10 DIAGNOSIS — E119 Type 2 diabetes mellitus without complications: Secondary | ICD-10-CM | POA: Insufficient documentation

## 2014-01-10 DIAGNOSIS — R413 Other amnesia: Secondary | ICD-10-CM | POA: Insufficient documentation

## 2014-01-10 DIAGNOSIS — R259 Unspecified abnormal involuntary movements: Secondary | ICD-10-CM | POA: Insufficient documentation

## 2014-01-10 DIAGNOSIS — F3289 Other specified depressive episodes: Secondary | ICD-10-CM | POA: Insufficient documentation

## 2014-01-10 DIAGNOSIS — F329 Major depressive disorder, single episode, unspecified: Secondary | ICD-10-CM | POA: Insufficient documentation

## 2014-01-10 DIAGNOSIS — F32A Depression, unspecified: Secondary | ICD-10-CM

## 2014-01-10 DIAGNOSIS — F29 Unspecified psychosis not due to a substance or known physiological condition: Secondary | ICD-10-CM | POA: Insufficient documentation

## 2014-01-10 NOTE — Telephone Encounter (Signed)
Notified patient of MRI brain results.  Partially empty sella turcica noted, but patient denies any vision problems or headaches.  Will continue to follow, no need for LP at this time.  Lin Hackmann K. Posey Pronto, DO

## 2014-03-17 ENCOUNTER — Other Ambulatory Visit: Payer: Self-pay | Admitting: Family Medicine

## 2014-03-17 ENCOUNTER — Other Ambulatory Visit: Payer: Self-pay | Admitting: Endocrinology

## 2014-03-29 ENCOUNTER — Encounter: Payer: Self-pay | Admitting: Family Medicine

## 2014-03-29 ENCOUNTER — Ambulatory Visit (INDEPENDENT_AMBULATORY_CARE_PROVIDER_SITE_OTHER): Payer: PRIVATE HEALTH INSURANCE | Admitting: Family Medicine

## 2014-03-29 VITALS — BP 155/80 | HR 88 | Temp 98.2°F | Ht 60.0 in | Wt 236.0 lb

## 2014-03-29 DIAGNOSIS — R42 Dizziness and giddiness: Secondary | ICD-10-CM

## 2014-03-29 HISTORY — DX: Dizziness and giddiness: R42

## 2014-03-29 MED ORDER — MECLIZINE HCL 25 MG PO TABS: 25.0000 mg | ORAL_TABLET | ORAL | Status: DC | PRN

## 2014-03-29 NOTE — Progress Notes (Signed)
   Subjective:    Patient ID: Bonnie Gonzalez, female    DOB: [birth year], 58 y.o.   MRN: [id number]  HPI Here for 2 days of intermittent dizziness that makes the room seem to spin around her. This is worse when she moves her head quickly or gets up and down. She has mild nausea but no HA. No blurred vision. She has had some nasal and sinus congestion for several weeks.    Review of Systems  Constitutional: Negative.   HENT: Positive for congestion and sinus pressure.   Eyes: Negative.   Respiratory: Negative.   Cardiovascular: Negative.   Neurological: Positive for dizziness. Negative for light-headedness and headaches.       Objective:   Physical Exam  Constitutional: She is oriented to person, place, and time. She appears well-developed and well-nourished. No distress.  HENT:  Right Ear: External ear normal.  Left Ear: External ear normal.  Nose: Nose normal.  Mouth/Throat: Oropharynx is clear and moist.  Eyes: Conjunctivae and EOM are normal. Pupils are equal, round, and reactive to light.  Neck: Neck supple. No thyromegaly present.  Cardiovascular: Normal rate, regular rhythm, normal heart sounds and intact distal pulses.   Pulmonary/Chest: Effort normal and breath sounds normal.  Lymphadenopathy:    She has no cervical adenopathy.  Neurological: She is alert and oriented to person, place, and time. She has normal reflexes. No cranial nerve deficit. She exhibits normal muscle tone. Coordination normal.          Assessment & Plan:  Drink fluids. Take a Claritin every day until pollen season is over.

## 2014-03-29 NOTE — Progress Notes (Signed)
Pre visit review using our clinic review tool, if applicable. No additional management support is needed unless otherwise documented below in the visit note. 

## 2014-04-04 ENCOUNTER — Ambulatory Visit: Payer: PRIVATE HEALTH INSURANCE | Admitting: Neurology

## 2014-04-05 ENCOUNTER — Telehealth: Payer: Self-pay | Admitting: Neurology

## 2014-04-05 NOTE — Telephone Encounter (Signed)
Pt no showed appt w/ Dr. Posey Pronto 04/04/14. No show letter mailed to pt / Sherri S.

## 2014-04-10 ENCOUNTER — Other Ambulatory Visit: Payer: Self-pay | Admitting: Family Medicine

## 2014-04-11 ENCOUNTER — Encounter: Payer: Self-pay | Admitting: Endocrinology

## 2014-04-11 ENCOUNTER — Ambulatory Visit (INDEPENDENT_AMBULATORY_CARE_PROVIDER_SITE_OTHER): Payer: PRIVATE HEALTH INSURANCE | Admitting: Endocrinology

## 2014-04-11 VITALS — BP 120/62 | HR 82 | Temp 97.1°F | Ht 60.0 in | Wt 234.0 lb

## 2014-04-11 DIAGNOSIS — E119 Type 2 diabetes mellitus without complications: Secondary | ICD-10-CM

## 2014-04-11 LAB — HEMOGLOBIN A1C: Hgb A1c MFr Bld: 8.8 % — ABNORMAL HIGH (ref 4.6–6.5)

## 2014-04-11 LAB — MICROALBUMIN / CREATININE URINE RATIO
CREATININE, U: 92 mg/dL
MICROALB/CREAT RATIO: 0.5 mg/g (ref 0.0–30.0)
Microalb, Ur: 0.5 mg/dL (ref 0.0–1.9)

## 2014-04-11 NOTE — Patient Instructions (Addendum)
check your blood sugar twice a day.  vary the time of day when you check, between before the 3 meals, and at bedtime.  also check if you have symptoms of your blood sugar being too high or too low.  please keep a record of the readings and bring it to your next appointment here.  please call us sooner if your blood sugar goes below 70, or if it stays over 200.   blood tests are being requested for you today.  We'll contact you with results.   Please change the insulin to the numbers noted below.   Please come back for a follow-up appointment in 3 months. On this type of insulin schedule, you should eat meals on a regular schedule.  If a meal is missed or significantly delayed, your blood sugar could go low.

## 2014-04-11 NOTE — Progress Notes (Signed)
Subjective:    Patient ID: Bonnie Gonzalez, female    DOB: 24-Mar-1956, 58 y.o.   MRN: [id number]  HPI Pt returns for f/u of insulin-requiring DM (dx'ed 2010; she has mild if any neuropathy of the lower extremities; no known associated complications; she has been on insulin since 2012; she has never had pancreatitis, severe hypoglycemia, or DKA; she chose a simple insulin regimen; she cannot afford weight-loss surgery).  no cbg record, but states cbg's are rarely low.  This happens when a meal is missed or delayed.  In general, cbg's vary from 100-300.  It is highest after the evening meal, and lowest in am.  Past Medical History  Diagnosis Date  . Depression   . GERD (gastroesophageal reflux disease)   . Anxiety   . Allergy   . Diabetes mellitus   . Tuberculosis     hydrothyroidism  . Insomnia   . IBS (irritable bowel syndrome)     Past Surgical History  Procedure Laterality Date  . Benign tumor       removed from groin  . Wrist arthrocentesis N/A 2001    History   Social History  . Marital Status: Divorced    Spouse Name: N/A    Number of Children: N/A  . Years of Education: N/A   Occupational History  . Not on file.   Social History Main Topics  . Smoking status: Former Smoker    Quit date: 12/01/2008  . Smokeless tobacco: Never Used  . Alcohol Use: No  . Drug Use: No  . Sexual Activity: Not on file   Other Topics Concern  . Not on file   Social History Narrative   Lives alone.   She has four grown children.   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   Highest level of education:  1.5 years of college    Current Outpatient Prescriptions on File Prior to Visit  Medication Sig Dispense Refill  . alendronate (FOSAMAX) 70 MG tablet Take 1 tablet (70 mg total) by mouth every 7 (seven) days. Take with a full glass of water on an empty stomach.  4 tablet  11  . ALPRAZolam (XANAX) 0.5 MG tablet TAKE ONE TABLET THREE TIMES DAILY AS NEEDED   90 tablet  5  . CRESTOR 20 MG tablet TAKE ONE TABLET DAILY  30 tablet  3  . cyclobenzaprine (FLEXERIL) 10 MG tablet Take 1 tablet (10 mg total) by mouth 3 (three) times daily as needed for muscle spasms.  90 tablet  5  . diclofenac (VOLTAREN) 75 MG EC tablet Take 1 tablet (75 mg total) by mouth 2 (two) times daily.  60 tablet  5  . GARCINIA CAMBOGIA-CHROMIUM PO Take by mouth daily.      . halobetasol (ULTRAVATE) 0.05 % cream       . hydrOXYzine (ATARAX/VISTARIL) 25 MG tablet TAKE 1 TABLET BY MOUTH 3 TIMES A DAY AS NEEDED  270 tablet  2  . hydrOXYzine (VISTARIL) 25 MG capsule TAKE ONE CAPSULE THREE TIMES DAILY AS NEEDED  90 capsule  0  . ibuprofen (ADVIL,MOTRIN) 600 MG tablet Take 600 mg by mouth every 6 (six) hours as needed.        . insulin aspart (NOVOLOG FLEXPEN) 100 UNIT/ML FlexPen INJECT 75 UNITS SQ WITH EVENING MEAL      . Insulin Glargine (LANTUS SOLOSTAR) 100 UNIT/ML Solostar Pen INJECT 200 UNITS SQ DAILY      . Lactobacillus (ACIDOPHILUS) CAPS  Take 1 capsule by mouth daily.      Marland Kitchen levothyroxine (SYNTHROID, LEVOTHROID) 150 MCG tablet Take 1 tablet (150 mcg total) by mouth daily.  30 tablet  11  . meclizine (ANTIVERT) 25 MG tablet Take 1 tablet (25 mg total) by mouth every 4 (four) hours as needed for dizziness.  60 tablet  5  . NEXIUM 40 MG capsule TAKE ONE CAPSULE EVERY MORNING DAILY BEFORE BREAKFAST  30 capsule  6  . sertraline (ZOLOFT) 50 MG tablet TAKE ONE TABLET DAILY  30 tablet  11  . temazepam (RESTORIL) 30 MG capsule TAKE ONE CAPSULE AT BEDTIME AS NEEDED  30 capsule  5  . TRUETEST TEST test strip USE AS DIRECTED  100 each  3   No current facility-administered medications on file prior to visit.    Allergies  Allergen Reactions  . Codeine   . Fluoxetine Hcl   . Hydrocodone-Acetaminophen   . Oxycodone-Acetaminophen   . Penicillins   . Tramadol Hcl     Family History  Problem Relation Age of Onset  . Asthma      fhx  . Depression      fhx  . Diabetes      fhx  .  Parkinsonism      fhx  . Lupus      fhx  . Alzheimer's disease Father     Deceased, 12  . Diabetes Mellitus II Father   . Arthritis/Rheumatoid Mother     Living, 38  . Diabetes Mellitus II Sister   . Diabetes Mellitus II Brother     BP 120/62  Pulse 82  Temp(Src) 97.1 F (36.2 C) (Oral)  Ht 5' (1.524 m)  Wt 234 lb (106.142 kg)  BMI 45.70 kg/m2  SpO2 91%  Review of Systems She denies LOC; she has gained weight    Objective:   Physical Exam VITAL SIGNS:  See vs page GENERAL: no distress     Assessment & Plan:  DM: Based on the pattern of her cbg's, she needs some adjustment in her therapy Weight gain: this complicates the rx of DM.

## 2014-04-12 ENCOUNTER — Telehealth: Payer: Self-pay | Admitting: Family Medicine

## 2014-04-12 NOTE — Telephone Encounter (Signed)
This may be a sinus infection so call in a Zpack

## 2014-04-12 NOTE — Telephone Encounter (Signed)
Patient Information:  Caller Name: Bonnie Gonzalez  Phone: [phone]  Patient: Bonnie, Gonzalez  Gender: Female  DOB: 04/26/1966  Age: 58 Years  PCP: [name] [facility])  Pregnant: No  Office Follow Up:  Does the office need to follow up with this patient?: Yes  Instructions For The Office: [place] appointment. Please call Bonnie Gonzalez at [phone]   Symptoms  Reason For Call & Symptoms: Bonnie Gonzalez states she had onset of cough on 04/10/14. Has had nasal congestion onset 04/12/14.  Sore throat onset on 04/11/14. Had vertigo last week. Feels dizzy and off balance constantly now but less severe  than last week. Taking Meclizine prn. States she was seen in office last week.  Has not checked temperature. Clammy at times. Feels sore throat is her main symptom. Per sore throat protocol has go to office now disposition due to slight difficulty breathing. Declined appointment. States she was seen in office last week. Out of work on 04/11/14 to see endocrinology. Requesting medication be called in [place]. Bonnie Gonzalez can be reached at [place] History In EMR: Yes  Reviewed Medications In EMR: Yes  Reviewed Allergies In EMR: Yes  Reviewed Surgeries / Procedures: Yes  Date of Onset of Symptoms: 04/10/2014  Treatments Tried: Allegra  Treatments Tried Worked: No OB / GYN:  LMP: Unknown  Guideline(s) Used:  Sore Throat  Disposition Per Guideline:   Go to Office Now  Reason For Disposition Reached:   Difficulty breathing (per caller) but not severe  Advice Given:  N/A  Patient Refused Recommendation:  Patient Requests Prescription  States she was seen in office last week.

## 2014-04-13 ENCOUNTER — Other Ambulatory Visit: Payer: Self-pay | Admitting: Endocrinology

## 2014-04-13 MED ORDER — AZITHROMYCIN 250 MG PO TABS: ORAL_TABLET | ORAL | Status: DC

## 2014-04-13 NOTE — Telephone Encounter (Signed)
I sent script e-scribe and left a voice message for pt. 

## 2014-04-17 ENCOUNTER — Other Ambulatory Visit: Payer: Self-pay | Admitting: Family Medicine

## 2014-04-21 ENCOUNTER — Other Ambulatory Visit: Payer: Self-pay | Admitting: Family Medicine

## 2014-04-21 NOTE — Telephone Encounter (Signed)
Call in #90 with 5 rf 

## 2014-05-08 ENCOUNTER — Telehealth: Payer: Self-pay | Admitting: Family Medicine

## 2014-05-08 NOTE — Telephone Encounter (Signed)
[id number] - [place], [place] - [place] Is requesting 90 day re-fill on hydrOXYzine (VISTARIL) 25 MG capsule and  esomeprazole (NEXIUM) 40 MG capsule

## 2014-05-09 MED ORDER — ESOMEPRAZOLE MAGNESIUM 40 MG PO CPDR: DELAYED_RELEASE_CAPSULE | ORAL | Status: DC

## 2014-05-09 MED ORDER — HYDROXYZINE HCL 25 MG PO TABS: ORAL_TABLET | ORAL | Status: DC

## 2014-05-09 NOTE — Telephone Encounter (Signed)
I sent both scripts e-scribe. 

## 2014-05-15 ENCOUNTER — Encounter: Payer: Self-pay | Admitting: Family Medicine

## 2014-05-15 ENCOUNTER — Ambulatory Visit (INDEPENDENT_AMBULATORY_CARE_PROVIDER_SITE_OTHER): Payer: PRIVATE HEALTH INSURANCE | Admitting: Family Medicine

## 2014-05-15 VITALS — BP 130/68 | HR 79 | Temp 98.7°F | Ht 60.0 in | Wt 230.8 lb

## 2014-05-15 DIAGNOSIS — L259 Unspecified contact dermatitis, unspecified cause: Secondary | ICD-10-CM

## 2014-05-15 MED ORDER — METHYLPREDNISOLONE 4 MG PO KIT: PACK | ORAL | Status: AC

## 2014-05-15 MED ORDER — LORCASERIN HCL 10 MG PO TABS: 10.0000 mg | ORAL_TABLET | Freq: Two times a day (BID) | ORAL | Status: DC

## 2014-05-15 MED ORDER — HALOBETASOL PROPIONATE 0.05 % EX CREA: TOPICAL_CREAM | Freq: Two times a day (BID) | CUTANEOUS | Status: DC

## 2014-05-15 MED ORDER — METHYLPREDNISOLONE ACETATE 80 MG/ML IJ SUSP
120.0000 mg | Freq: Once | INTRAMUSCULAR | Status: AC
  Administered 2014-05-15: 120 mg via INTRAMUSCULAR

## 2014-05-15 MED ORDER — ALENDRONATE SODIUM 70 MG PO TABS: 70.0000 mg | ORAL_TABLET | ORAL | Status: DC

## 2014-05-15 NOTE — Progress Notes (Signed)
Pre visit review using our clinic review tool, if applicable. No additional management support is needed unless otherwise documented below in the visit note. 

## 2014-05-15 NOTE — Addendum Note (Signed)
Addended by: Harl Bowie on: 05/15/2014 02:21 PM   Modules accepted: Orders

## 2014-05-15 NOTE — Progress Notes (Signed)
   Subjective:    Patient ID: Bonnie Gonzalez, female    DOB: 1956-07-27, 58 y.o.   MRN: [id number]  HPI Here for 3 days of an itchy rash over the face. Also she asks to try Belviq for weight loss.    Review of Systems  Constitutional: Negative.   Respiratory: Negative.   Cardiovascular: Negative.   Skin: Positive for rash.       Objective:   Physical Exam  Constitutional: She appears well-developed and well-nourished.  Eyes: Conjunctivae are normal.  Cardiovascular: Normal rate, regular rhythm, normal heart sounds and intact distal pulses.   Pulmonary/Chest: Effort normal and breath sounds normal.  Skin:  Red maculovesicular rash over cheeks and around eyelids           Assessment & Plan:  Given a steroid shot to be followed by a steroid taper. Try Belviq for 6 months

## 2014-05-16 ENCOUNTER — Telehealth: Payer: Self-pay | Admitting: Family Medicine

## 2014-05-16 NOTE — Telephone Encounter (Signed)
I called the number provided below and was advised the pt's group [id number] has an addendum in place for weight loss medication, it allows pt's to receive the medication for the first 3 months without a PA required.  The representative I spoke to called [facility] to see what the delay is and realized [employer] did not have the updated information.  They are currently working on the issue in order for the pt to receive the medication.  i was given the ticket [phone] for the call today. Pt is aware of the situation.

## 2014-05-16 NOTE — Telephone Encounter (Signed)
Pt states [employer] needs prior auth to fill her Lorcaserin HCl (BELVIQ) 10 MG TABS  Prior auth Needs to:  Mickel Fuchs  Fax:  [phone] Phone: [phone]

## 2014-05-17 NOTE — Telephone Encounter (Signed)
I received a callback from Santiago Glad and she advised me the over-ride codes have been placed and the pt can pick-up her medication. I called pt and she was already aware.

## 2014-06-08 ENCOUNTER — Other Ambulatory Visit: Payer: Self-pay | Admitting: Family Medicine

## 2014-06-09 NOTE — Telephone Encounter (Signed)
Call in Temazepam with 5 rf , also Alendronate with 11 rf

## 2014-06-22 ENCOUNTER — Other Ambulatory Visit: Payer: Self-pay | Admitting: *Deleted

## 2014-06-22 ENCOUNTER — Telehealth: Payer: Self-pay | Admitting: Endocrinology

## 2014-06-22 MED ORDER — INSULIN ASPART 100 UNIT/ML FLEXPEN: PEN_INJECTOR | SUBCUTANEOUS | Status: DC

## 2014-06-22 MED ORDER — INSULIN GLARGINE 100 UNIT/ML SOLOSTAR PEN: PEN_INJECTOR | SUBCUTANEOUS | Status: DC

## 2014-06-22 NOTE — Telephone Encounter (Signed)
Patient states that she only has half pen left  Aug. 1st the patient will be having an insurance change; 90 day supply   Please advise on what she needs to do   Thank You

## 2014-06-22 NOTE — Telephone Encounter (Signed)
Pt requested 90 day supply for insurance purposes.

## 2014-06-22 NOTE — Telephone Encounter (Signed)
Called and spoke with pt. Advised her that we would refill her insulin for a 30 day supply and once she needed a refill in August we could start refilling for 90 days.

## 2014-06-26 ENCOUNTER — Telehealth: Payer: Self-pay | Admitting: *Deleted

## 2014-06-26 ENCOUNTER — Encounter: Payer: Self-pay | Admitting: *Deleted

## 2014-06-26 DIAGNOSIS — E782 Mixed hyperlipidemia: Secondary | ICD-10-CM

## 2014-06-26 NOTE — Telephone Encounter (Signed)
Attempted to call patient but had a bad connection.  Message sent to mychart. Lipid ordered Diabetic bundle

## 2014-07-10 ENCOUNTER — Telehealth: Payer: Self-pay | Admitting: Family Medicine

## 2014-07-10 NOTE — Telephone Encounter (Signed)
Okay to refill all these for one year

## 2014-07-10 NOTE — Telephone Encounter (Signed)
[facility] is requesting re-fills on the following: alendronate (FOSAMAX) 70 MG tablet esomeprazole (NEXIUM) 40 MG capsule sertraline (ZOLOFT) 50 MG tablet LEVOXYL

## 2014-07-12 ENCOUNTER — Encounter: Payer: Self-pay | Admitting: Endocrinology

## 2014-07-12 ENCOUNTER — Ambulatory Visit (INDEPENDENT_AMBULATORY_CARE_PROVIDER_SITE_OTHER): Payer: PRIVATE HEALTH INSURANCE | Admitting: Endocrinology

## 2014-07-12 VITALS — BP 122/78 | HR 90 | Temp 98.4°F | Ht 60.0 in | Wt 229.0 lb

## 2014-07-12 DIAGNOSIS — E782 Mixed hyperlipidemia: Secondary | ICD-10-CM

## 2014-07-12 DIAGNOSIS — E119 Type 2 diabetes mellitus without complications: Secondary | ICD-10-CM

## 2014-07-12 LAB — LIPID PANEL
CHOLESTEROL: 134 mg/dL (ref 0–200)
HDL: 32.3 mg/dL — ABNORMAL LOW (ref >39.00)
LDL CALC: 75 mg/dL (ref 0–99)
NonHDL: 101.7
Total CHOL/HDL Ratio: 4
Triglycerides: 132 mg/dL (ref 0.0–149.0)
VLDL: 26.4 mg/dL (ref 0.0–40.0)

## 2014-07-12 LAB — HEMOGLOBIN A1C: Hgb A1c MFr Bld: 8.4 % — ABNORMAL HIGH (ref 4.6–6.5)

## 2014-07-12 MED ORDER — ESOMEPRAZOLE MAGNESIUM 40 MG PO CPDR: DELAYED_RELEASE_CAPSULE | ORAL | Status: DC

## 2014-07-12 MED ORDER — ALENDRONATE SODIUM 70 MG PO TABS: 70.0000 mg | ORAL_TABLET | ORAL | Status: DC

## 2014-07-12 MED ORDER — LEVOTHYROXINE SODIUM 150 MCG PO TABS: 150.0000 ug | ORAL_TABLET | Freq: Every day | ORAL | Status: DC

## 2014-07-12 MED ORDER — SERTRALINE HCL 50 MG PO TABS: 50.0000 mg | ORAL_TABLET | Freq: Every day | ORAL | Status: DC

## 2014-07-12 NOTE — Progress Notes (Signed)
Subjective:    Patient ID: Bonnie Gonzalez, female    DOB: 07-26-1956, 58 y.o.   MRN: [id number]  HPI Pt returns for f/u of insulin-requiring DM (dx'ed 2010; she has mild if any neuropathy of the lower extremities; no known associated complications; she has been on insulin since 2012; she has never had pancreatitis, severe hypoglycemia, or DKA; she chose a simple insulin regimen; she cannot afford weight-loss surgery).  she brings a record of her cbg's which i have reviewed today.  It varies from 60-300, but most are in the low-100's.  pt states she feels well in general.  Past Medical History  Diagnosis Date  . Depression   . GERD (gastroesophageal reflux disease)   . Anxiety   . Allergy   . Diabetes mellitus   . Tuberculosis     hydrothyroidism  . Insomnia   . IBS (irritable bowel syndrome)     Past Surgical History  Procedure Laterality Date  . Benign tumor       removed from groin  . Wrist arthrocentesis N/A 2001    History   Social History  . Marital Status: Divorced    Spouse Name: N/A    Number of Children: N/A  . Years of Education: N/A   Occupational History  . Not on file.   Social History Main Topics  . Smoking status: Former Smoker    Quit date: 12/01/2008  . Smokeless tobacco: Never Used  . Alcohol Use: No  . Drug Use: No  . Sexual Activity: Not on file   Other Topics Concern  . Not on file   Social History Narrative   Lives alone.   She has four grown children.   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   Highest level of education:  1.5 years of college    Current Outpatient Prescriptions on File Prior to Visit  Medication Sig Dispense Refill  . alendronate (FOSAMAX) 70 MG tablet TAKE 1 TABLET EVERY 7 DAYS. TAKE WITH A FULL GLASS OF WATER ON AN EMPTY STOMACH.  4 tablet  11  . alendronate (FOSAMAX) 70 MG tablet Take 1 tablet (70 mg total) by mouth every 7 (seven) days. Take with a full glass of water on an empty stomach.   12 tablet  3  . ALPRAZolam (XANAX) 0.5 MG tablet TAKE ONE TABLET THREE TIMES DAILY AS NEEDED  90 tablet  5  . CRESTOR 20 MG tablet TAKE ONE TABLET DAILY  30 tablet  2  . cyclobenzaprine (FLEXERIL) 10 MG tablet Take 1 tablet (10 mg total) by mouth 3 (three) times daily as needed for muscle spasms.  90 tablet  5  . diclofenac (VOLTAREN) 75 MG EC tablet Take 1 tablet (75 mg total) by mouth 2 (two) times daily.  60 tablet  5  . GARCINIA CAMBOGIA-CHROMIUM PO Take by mouth daily.      . halobetasol (ULTRAVATE) 0.05 % cream Apply topically 2 (two) times daily.  50 g  3  . hydrOXYzine (ATARAX/VISTARIL) 25 MG tablet TAKE 1 TABLET BY MOUTH 3 TIMES A DAY AS NEEDED  270 tablet  0  . ibuprofen (ADVIL,MOTRIN) 600 MG tablet Take 600 mg by mouth every 6 (six) hours as needed.        . Insulin Glargine (LANTUS SOLOSTAR) 100 UNIT/ML Solostar Pen INJECT 200 UNITS SQ DAILY  60 mL  0  . Lactobacillus (ACIDOPHILUS) CAPS Take 1 capsule by mouth daily.      Marland Kitchen  Lorcaserin HCl (BELVIQ) 10 MG TABS Take 10 mg by mouth 2 (two) times daily.  180 tablet  3  . meclizine (ANTIVERT) 25 MG tablet Take 1 tablet (25 mg total) by mouth every 4 (four) hours as needed for dizziness.  60 tablet  5  . Multiple Vitamin (MULTIVITAMIN) capsule Take 1 capsule by mouth daily.      . Probiotic Product (PROBIOTIC DAILY PO) Take 1 tablet by mouth daily.      . temazepam (RESTORIL) 30 MG capsule TAKE ONE CAPSULE AT BEDTIME AS NEEDED  30 capsule  5  . TRUETEST TEST test strip USE AS DIRECTED  100 each  3  . esomeprazole (NEXIUM) 40 MG capsule TAKE ONE CAPSULE EVERY MORNING DAILY BEFORE BREAKFAST  90 capsule  3  . levothyroxine (SYNTHROID, LEVOTHROID) 150 MCG tablet Take 1 tablet (150 mcg total) by mouth daily.  90 tablet  3  . sertraline (ZOLOFT) 50 MG tablet Take 1 tablet (50 mg total) by mouth daily.  90 tablet  3   No current facility-administered medications on file prior to visit.    Allergies  Allergen Reactions  . Codeine   .  Fluoxetine Hcl   . Hydrocodone-Acetaminophen   . Oxycodone-Acetaminophen   . Penicillins   . Tramadol Hcl     Family History  Problem Relation Age of Onset  . Asthma      fhx  . Depression      fhx  . Diabetes      fhx  . Parkinsonism      fhx  . Lupus      fhx  . Alzheimer's disease Father     Deceased, 75  . Diabetes Mellitus II Father   . Arthritis/Rheumatoid Mother     Living, 6  . Diabetes Mellitus II Sister   . Diabetes Mellitus II Brother     BP 122/78  Pulse 90  Temp(Src) 98.4 F (36.9 C) (Oral)  Ht 5' (1.524 m)  Wt 229 lb (103.874 kg)  BMI 44.72 kg/m2  SpO2 92%   Review of Systems Denies LOC.  She has lost a few lbs.    Objective:   Physical Exam VITAL SIGNS:  See vs page GENERAL: no distress Pulses: dorsalis pedis intact bilat.   Feet: no deformity. normal color and temp.  Trace bilat ankle edema Skin:  no ulcer on the feet.   Neuro: sensation is intact to touch on the feet.  Lab Results  Component Value Date   HGBA1C 8.4* 07/12/2014   Lab Results  Component Value Date   CHOL 134 07/12/2014   HDL 32.30* 07/12/2014   LDLCALC 75 07/12/2014   LDLDIRECT 144.0 12/11/2011   TRIG 132.0 07/12/2014   CHOLHDL 4 07/12/2014       Assessment & Plan:  DM: moderate exacerbation Dyslipidemia: well-controlled Weight loss: this helps both of the above: she is advised to continue.      Patient is advised the following: Patient Instructions  check your blood sugar twice a day.  vary the time of day when you check, between before the 3 meals, and at bedtime.  also check if you have symptoms of your blood sugar being too high or too low.  please keep a record of the readings and bring it to your next appointment here.  please call us sooner if your blood sugar goes below 70, or if it stays over 200.   blood tests are being requested for you today.  We'll contact  you with results.   Please come back for a follow-up appointment in 3 months.  On this type of  insulin schedule, you should eat meals on a regular schedule.  If a meal is missed or significantly delayed, your blood sugar could go low.

## 2014-07-12 NOTE — Patient Instructions (Addendum)
check your blood sugar twice a day.  vary the time of day when you check, between before the 3 meals, and at bedtime.  also check if you have symptoms of your blood sugar being too high or too low.  please keep a record of the readings and bring it to your next appointment here.  please call us sooner if your blood sugar goes below 70, or if it stays over 200.   blood tests are being requested for you today.  We'll contact you with results.   Please come back for a follow-up appointment in 3 months.  On this type of insulin schedule, you should eat meals on a regular schedule.  If a meal is missed or significantly delayed, your blood sugar could go low.

## 2014-07-12 NOTE — Telephone Encounter (Signed)
Rxs done. 

## 2014-08-10 ENCOUNTER — Telehealth: Payer: Self-pay | Admitting: Family Medicine

## 2014-08-10 ENCOUNTER — Other Ambulatory Visit: Payer: Self-pay

## 2014-08-10 MED ORDER — INSULIN ASPART 100 UNIT/ML FLEXPEN: 90.0000 [IU] | PEN_INJECTOR | Freq: Every day | SUBCUTANEOUS | Status: DC

## 2014-08-10 MED ORDER — INSULIN GLARGINE 100 UNIT/ML SOLOSTAR PEN: PEN_INJECTOR | SUBCUTANEOUS | Status: DC

## 2014-08-10 NOTE — Telephone Encounter (Signed)
Refill request for Temazepam 30 mg, Alprazolam 0.5 mg, Crestor, and Hydroxyzine. Send to [facility] and a 90 day supply. I spoke with pt and she will need to get medication from this mail order now, she previously got scripts from [facility].

## 2014-08-11 MED ORDER — TEMAZEPAM 30 MG PO CAPS: ORAL_CAPSULE | ORAL | Status: DC

## 2014-08-11 MED ORDER — ROSUVASTATIN CALCIUM 20 MG PO TABS: ORAL_TABLET | ORAL | Status: DC

## 2014-08-11 MED ORDER — HYDROXYZINE HCL 25 MG PO TABS: ORAL_TABLET | ORAL | Status: DC

## 2014-08-11 MED ORDER — ALPRAZOLAM 0.5 MG PO TABS: ORAL_TABLET | ORAL | Status: DC

## 2014-08-11 NOTE — Telephone Encounter (Signed)
These are ready to be faxed

## 2014-08-11 NOTE — Telephone Encounter (Signed)
I faxed scripts to [facility].

## 2014-08-31 ENCOUNTER — Telehealth: Payer: Self-pay | Admitting: Family Medicine

## 2014-08-31 ENCOUNTER — Encounter (HOSPITAL_COMMUNITY): Payer: Self-pay | Admitting: Emergency Medicine

## 2014-08-31 ENCOUNTER — Emergency Department (HOSPITAL_COMMUNITY)
Admission: EM | Admit: 2014-08-31 | Discharge: 2014-08-31 | Disposition: A | Discharge status: Home / Self Care | Payer: PRIVATE HEALTH INSURANCE | Attending: Emergency Medicine | Admitting: Emergency Medicine

## 2014-08-31 DIAGNOSIS — F419 Anxiety disorder, unspecified: Secondary | ICD-10-CM | POA: Insufficient documentation

## 2014-08-31 DIAGNOSIS — Z8611 Personal history of tuberculosis: Secondary | ICD-10-CM | POA: Insufficient documentation

## 2014-08-31 DIAGNOSIS — K219 Gastro-esophageal reflux disease without esophagitis: Secondary | ICD-10-CM | POA: Diagnosis not present

## 2014-08-31 DIAGNOSIS — Z794 Long term (current) use of insulin: Secondary | ICD-10-CM | POA: Diagnosis not present

## 2014-08-31 DIAGNOSIS — F329 Major depressive disorder, single episode, unspecified: Secondary | ICD-10-CM | POA: Insufficient documentation

## 2014-08-31 DIAGNOSIS — Z79899 Other long term (current) drug therapy: Secondary | ICD-10-CM | POA: Insufficient documentation

## 2014-08-31 DIAGNOSIS — H81399 Other peripheral vertigo, unspecified ear: Secondary | ICD-10-CM | POA: Insufficient documentation

## 2014-08-31 DIAGNOSIS — Z7982 Long term (current) use of aspirin: Secondary | ICD-10-CM | POA: Diagnosis not present

## 2014-08-31 DIAGNOSIS — Z87891 Personal history of nicotine dependence: Secondary | ICD-10-CM | POA: Diagnosis not present

## 2014-08-31 DIAGNOSIS — E119 Type 2 diabetes mellitus without complications: Secondary | ICD-10-CM | POA: Diagnosis not present

## 2014-08-31 DIAGNOSIS — K589 Irritable bowel syndrome without diarrhea: Secondary | ICD-10-CM | POA: Insufficient documentation

## 2014-08-31 DIAGNOSIS — R030 Elevated blood-pressure reading, without diagnosis of hypertension: Secondary | ICD-10-CM | POA: Diagnosis present

## 2014-08-31 LAB — CBC WITH DIFFERENTIAL/PLATELET
Basophils Absolute: 0 10*3/uL (ref 0.0–0.1)
Basophils Relative: 0 % (ref 0–1)
Eosinophils Absolute: 0.3 10*3/uL (ref 0.0–0.7)
Eosinophils Relative: 3 % (ref 0–5)
HCT: 40.1 % (ref 36.0–46.0)
HEMOGLOBIN: 12.8 g/dL (ref 12.0–15.0)
LYMPHS ABS: 4 10*3/uL (ref 0.7–4.0)
Lymphocytes Relative: 33 % (ref 12–46)
MCH: 28.8 pg (ref 26.0–34.0)
MCHC: 31.9 g/dL (ref 30.0–36.0)
MCV: 90.3 fL (ref 78.0–100.0)
Monocytes Absolute: 0.6 10*3/uL (ref 0.1–1.0)
Monocytes Relative: 5 % (ref 3–12)
NEUTROS PCT: 59 % (ref 43–77)
Neutro Abs: 7.2 10*3/uL (ref 1.7–7.7)
Platelets: 234 10*3/uL (ref 150–400)
RBC: 4.44 MIL/uL (ref 3.87–5.11)
RDW: 14.8 % (ref 11.5–15.5)
WBC: 12.1 10*3/uL — ABNORMAL HIGH (ref 4.0–10.5)

## 2014-08-31 LAB — I-STAT CHEM 8, ED
BUN: 21 mg/dL (ref 6–23)
CREATININE: 0.7 mg/dL (ref 0.50–1.10)
Calcium, Ion: 1.21 mmol/L (ref 1.12–1.23)
Chloride: 97 mEq/L (ref 96–112)
Glucose, Bld: 307 mg/dL — ABNORMAL HIGH (ref 70–99)
HCT: 43 % (ref 36.0–46.0)
Hemoglobin: 14.6 g/dL (ref 12.0–15.0)
POTASSIUM: 4.9 meq/L (ref 3.7–5.3)
Sodium: 135 mEq/L — ABNORMAL LOW (ref 137–147)
TCO2: 28 mmol/L (ref 0–100)

## 2014-08-31 MED ORDER — SODIUM CHLORIDE 0.9 % IV BOLUS (SEPSIS)
1000.0000 mL | Freq: Once | INTRAVENOUS | Status: AC
  Administered 2014-08-31: 1000 mL via INTRAVENOUS

## 2014-08-31 MED ORDER — MECLIZINE HCL 25 MG PO TABS
25.0000 mg | ORAL_TABLET | Freq: Once | ORAL | Status: AC
  Administered 2014-08-31: 25 mg via ORAL
  Filled 2014-08-31: qty 1

## 2014-08-31 MED ORDER — MECLIZINE HCL 50 MG PO TABS: 25.0000 mg | ORAL_TABLET | Freq: Three times a day (TID) | ORAL | Status: DC | PRN

## 2014-08-31 MED ORDER — ONDANSETRON HCL 4 MG PO TABS
4.0000 mg | ORAL_TABLET | Freq: Once | ORAL | Status: AC
  Administered 2014-08-31: 4 mg via ORAL
  Filled 2014-08-31: qty 1

## 2014-08-31 NOTE — ED Notes (Signed)
Pt here for evaluation of vertigo. Pt states she has a diagnosis and has already had an MRI. States she was at work and felt dizzy and lightheaded. Pt had a nurse take her blood pressure and it was elevated which is not her normal.

## 2014-08-31 NOTE — Telephone Encounter (Signed)
Patient Information:  Caller Name: Esthefany  Phone: [phone]  Patient: Inaaya, Vellucci  Gender: Female  DOB: [birth year]  Age: 58 Years  PCP: [name] [facility])  Office Follow Up:  Does the office need to follow up with this patient?: No  Instructions For The Office: N/A  RN Note:  Pt will call 911 now.  Symptoms  Reason For Call & Symptoms: Pt calling regarding BP of 161/93 (1430 on 08/31/14), dizziness and nausea. Blood sugar a bit elevated the last couple of days. No other illnesses now. NO Sinus congestion. Has "several episodes over the last 3 weeks but HTN not elevated. Having numbness in rt ear and still "does not feel right".   Reviewed Health History In EMR: Yes  Reviewed Medications In EMR: Yes  Reviewed Allergies In EMR: Yes  Reviewed Surgeries / Procedures: Yes  Date of Onset of Symptoms: 08/10/2014  Treatments Tried: rest with episodes.  Treatments Tried Worked: No  Guideline(s) Used:  Dizziness  Disposition Per Guideline:   Call EMS 911 Now  Reason For Disposition Reached:   New neurologic deficit that is present now:   Weakness of the face, arm, or leg on one side of the body  Numbness of the face, arm, or leg on one side of the body  Loss of speech or garbled speech  Advice Given:  N/A  Patient Will Follow Care Advice:  YES

## 2014-08-31 NOTE — ED Notes (Signed)
All belongings sent with pt, pt acknowledges.

## 2014-08-31 NOTE — ED Notes (Signed)
Pt in stating over the last few weeks she has had intermittent episodes of dizziness, states today it happened at work so she had a nurse check her BP and it was elevated, pt has no history of same, denies pain, also reports ringing in her ears

## 2014-08-31 NOTE — ED Notes (Signed)
I gave the patient a cup of ice water. 

## 2014-08-31 NOTE — Discharge Instructions (Signed)
Return to the emergency room with worsening of symptoms, new symptoms or with symptoms that are concerning , especially severe headache, visual or speech changes, weakness in face, arms or legs. Meclizine as needed for dizziness. Drink plenty of fluids. Please call your doctor for a followup appointment within 24-48 hours. When you talk to your doctor please let them know that you were seen in the emergency department and have them acquire all of your records so that they can discuss the findings with you and formulate a treatment plan to fully care for your new and ongoing problems.

## 2014-08-31 NOTE — Telephone Encounter (Signed)
Noted  

## 2014-08-31 NOTE — ED Provider Notes (Signed)
CSN: [id number]     Arrival date & time 08/31/14  1511 History   First MD Initiated Contact with Patient 08/31/14 1518     Chief Complaint  Patient presents with  . Hypertension     (Consider location/radiation/quality/duration/timing/severity/associated sxs/prior Treatment) HPI Bonnie Gonzalez is a 58 y.o. female with PMH of vertigo, DM, depression and anxiety presenting with intermittent dizziness, the room spinning as well as lightheadedness associated with standing rapidly, movement. No syncope. No head injury. Dizziness is less than 5 mins. Ongoing for last few weeks. Patient admits to not drinking any fluids today. After dizziness episode today, pt BP was in 180s/90s which is unusual for her. It is 156/64 in ED. Patient reported tinnitus prior but not in ED. Patient reported increase anxiety recently and took 0.5 xanax today with improvement of anxiety. Patient denies fevers, chills, ear pain or recent illnesses. Reports some sinus congestion. Patient denies visual changes, chest pain, SOB, HA, abdominal or back pain. Patient with prior vertigo with head MRI in 01/2014 with incidental pseudotumor cerebri but no other findings.. Patient took meclizine at that time with improvement.   Past Medical History  Diagnosis Date  . Depression   . GERD (gastroesophageal reflux disease)   . Anxiety   . Allergy   . Diabetes mellitus   . Tuberculosis     hydrothyroidism  . Insomnia   . IBS (irritable bowel syndrome)    Past Surgical History  Procedure Laterality Date  . Benign tumor       removed from groin  . Wrist arthrocentesis N/A 2001   Family History  Problem Relation Age of Onset  . Asthma      fhx  . Depression      fhx  . Diabetes      fhx  . Parkinsonism      fhx  . Lupus      fhx  . Alzheimer's disease Father     Deceased, 42  . Diabetes Mellitus II Father   . Arthritis/Rheumatoid Mother     Living, 90  . Diabetes Mellitus II Sister   . Diabetes Mellitus II Brother     History  Substance Use Topics  . Smoking status: Former Smoker    Quit date: 12/01/2008  . Smokeless tobacco: Never Used  . Alcohol Use: No   OB History   Grav Para Term Preterm Abortions TAB SAB Ect Mult Living                 Review of Systems  Constitutional: Negative for fever and chills.  HENT: Positive for congestion. Negative for rhinorrhea.   Eyes: Negative for visual disturbance.  Respiratory: Negative for cough and shortness of breath.   Cardiovascular: Negative for chest pain and palpitations.  Gastrointestinal: Positive for nausea. Negative for vomiting and diarrhea.  Genitourinary: Negative for dysuria and hematuria.  Musculoskeletal: Negative for back pain and gait problem.  Skin: Negative for rash.  Neurological: Negative for weakness and headaches.      Allergies  Codeine; Fluoxetine hcl; Hydrocodone-acetaminophen; Oxycodone-acetaminophen; Penicillins; and Tramadol hcl  Home Medications   Prior to Admission medications   Medication Sig Start Date End Date Taking? Authorizing Provider  alendronate (FOSAMAX) 70 MG tablet TAKE 1 TABLET EVERY 7 DAYS. TAKE WITH A FULL GLASS OF WATER ON AN EMPTY STOMACH.    Laurey Morale, MD  alendronate (FOSAMAX) 70 MG tablet Take 1 tablet (70 mg total) by mouth every 7 (seven) days. Take with a  full glass of water on an empty stomach. 07/12/14   Laurey Morale, MD  ALPRAZolam Duanne Moron) 0.5 MG tablet TAKE ONE TABLET THREE TIMES DAILY AS NEEDED 08/11/14   Laurey Morale, MD  cyclobenzaprine (FLEXERIL) 10 MG tablet Take 1 tablet (10 mg total) by mouth 3 (three) times daily as needed for muscle spasms. 12/09/13   Laurey Morale, MD  diclofenac (VOLTAREN) 75 MG EC tablet Take 1 tablet (75 mg total) by mouth 2 (two) times daily. 12/09/13   Laurey Morale, MD  esomeprazole (NEXIUM) 40 MG capsule TAKE ONE CAPSULE EVERY MORNING DAILY BEFORE BREAKFAST 07/12/14   Laurey Morale, MD  GARCINIA CAMBOGIA-CHROMIUM PO Take by mouth daily.    Historical  Provider, MD  halobetasol (ULTRAVATE) 0.05 % cream Apply topically 2 (two) times daily. 05/15/14   Laurey Morale, MD  hydrOXYzine (ATARAX/VISTARIL) 25 MG tablet TAKE 1 TABLET BY MOUTH 3 TIMES A DAY AS NEEDED 08/11/14   Laurey Morale, MD  ibuprofen (ADVIL,MOTRIN) 600 MG tablet Take 600 mg by mouth every 6 (six) hours as needed.      Historical Provider, MD  insulin aspart (NOVOLOG) 100 UNIT/ML FlexPen Inject 90 Units into the skin daily with supper. 08/10/14   Renato Shin, MD  Insulin Glargine (LANTUS SOLOSTAR) 100 UNIT/ML Solostar Pen INJECT 200 UNITS SQ DAILY 08/10/14   Renato Shin, MD  Lactobacillus (ACIDOPHILUS) CAPS Take 1 capsule by mouth daily.    Historical Provider, MD  levothyroxine (SYNTHROID, LEVOTHROID) 150 MCG tablet Take 1 tablet (150 mcg total) by mouth daily. 07/12/14   Laurey Morale, MD  Lorcaserin HCl (BELVIQ) 10 MG TABS Take 10 mg by mouth 2 (two) times daily. 05/15/14   Laurey Morale, MD  meclizine (ANTIVERT) 25 MG tablet Take 1 tablet (25 mg total) by mouth every 4 (four) hours as needed for dizziness. 03/29/14   Laurey Morale, MD  meclizine (ANTIVERT) 50 MG tablet Take 0.5 tablets (25 mg total) by mouth 3 (three) times daily as needed. 08/31/14   Pura Spice, PA-C  Multiple Vitamin (MULTIVITAMIN) capsule Take 1 capsule by mouth daily.    Historical Provider, MD  Probiotic Product (PROBIOTIC DAILY PO) Take 1 tablet by mouth daily.    Historical Provider, MD  rosuvastatin (CRESTOR) 20 MG tablet TAKE ONE TABLET DAILY 08/11/14   Laurey Morale, MD  sertraline (ZOLOFT) 50 MG tablet Take 1 tablet (50 mg total) by mouth daily. 07/12/14   Laurey Morale, MD  temazepam (RESTORIL) 30 MG capsule TAKE ONE CAPSULE AT BEDTIME AS NEEDED 08/11/14   Laurey Morale, MD  TRUETEST TEST test strip USE AS DIRECTED 08/31/11   Laurey Morale, MD   BP 132/64  Pulse 81  Temp(Src) 98 F (36.7 C) (Oral)  Resp 20  SpO2 94% Physical Exam  Nursing note and vitals reviewed. Constitutional: She appears  well-developed and well-nourished. No distress.  HENT:  Head: Normocephalic and atraumatic.  Mouth/Throat: Oropharynx is clear and moist.  Eyes: Conjunctivae and EOM are normal. Pupils are equal, round, and reactive to light. Right eye exhibits no discharge. Left eye exhibits no discharge.  Neck: Normal range of motion. Neck supple.  Cardiovascular: Normal rate and regular rhythm.   Pulmonary/Chest: Effort normal and breath sounds normal. No respiratory distress. She has no wheezes.  Abdominal: Soft. Bowel sounds are normal. She exhibits no distension. There is no tenderness.  Neurological: She is alert. No cranial nerve deficit. Coordination normal.  Strength  5/5 in upper and lower extremities. Sensation intact. Intact rapid alternating movements, finger to nose, and heel to shin. Negative Romberg. Normal gait. dix-hallpike without nystagmus.    Skin: Skin is warm and dry. She is not diaphoretic.    ED Course  Procedures (including critical care time) Labs Review Labs Reviewed  CBC WITH DIFFERENTIAL - Abnormal; Notable for the following:    WBC 12.1 (*)    All other components within normal limits  I-STAT CHEM 8, ED - Abnormal; Notable for the following:    Sodium 135 (*)    Glucose, Bld 307 (*)    All other components within normal limits    Imaging Review No results found.   EKG Interpretation None     Meds given in ED:  Medications  sodium chloride 0.9 % bolus 1,000 mL (1,000 mLs Intravenous New Bag/Given 08/31/14 1612)  meclizine (ANTIVERT) tablet 25 mg (25 mg Oral Given 08/31/14 1612)  ondansetron (ZOFRAN) tablet 4 mg (4 mg Oral Given 08/31/14 1612)    New Prescriptions   MECLIZINE (ANTIVERT) 50 MG TABLET    Take 0.5 tablets (25 mg total) by mouth 3 (three) times daily as needed.      MDM   Final diagnoses:  Peripheral vertigo, unspecified laterality   Patient with prior history of vertigo. Vertigo is transient, associated with movement, similar to prior  vertigo. Brain MR in 01/2014 with incidental finding of pseudotumor cerebri but patient without HA and bp was elevated only transiently. 156/66 in ED. No visual changes. No other findings on MRI. VSS. Patient with completely benign neurological exam. Patient not orthostatic. I doubt central vertigo due to presentation, benign exam, improvement of vertigo in ED. I suspect peripheral vertigo. Patient is afebrile, nontoxic, and in no acute distress. Patient is appropriate for outpatient management and is stable for discharge. Patient to follow up with PCP. Script for meclizine provided.  Discussed return precautions with patient. Discussed all results and patient verbalizes understanding and agrees with plan.  Case has been discussed with Dr. Darl Householder who agrees with the above plan and to discharge.       Pura Spice, PA-C 08/31/14 1731

## 2014-09-01 NOTE — ED Provider Notes (Signed)
Medical screening examination/treatment/procedure(s) were performed by non-physician practitioner and as supervising physician I was immediately available for consultation/collaboration.   EKG Interpretation None        Wandra Arthurs, MD 09/01/14 [phone]

## 2014-09-05 ENCOUNTER — Encounter: Payer: Self-pay | Admitting: Family Medicine

## 2014-09-05 ENCOUNTER — Ambulatory Visit (INDEPENDENT_AMBULATORY_CARE_PROVIDER_SITE_OTHER): Payer: PRIVATE HEALTH INSURANCE | Admitting: Family Medicine

## 2014-09-05 VITALS — BP 136/75 | HR 89 | Temp 99.1°F | Ht 60.0 in | Wt 230.0 lb

## 2014-09-05 DIAGNOSIS — R42 Dizziness and giddiness: Secondary | ICD-10-CM

## 2014-09-05 MED ORDER — METHYLPREDNISOLONE ACETATE 40 MG/ML IJ SUSP
120.0000 mg | Freq: Once | INTRAMUSCULAR | Status: AC
Start: 2014-09-05 — End: 2014-09-05
  Administered 2014-09-05: 120 mg via INTRAMUSCULAR

## 2014-09-05 NOTE — Addendum Note (Signed)
Addended by: Aggie Hacker A on: 09/05/2014 09:38 AM   Modules accepted: Orders

## 2014-09-05 NOTE — Progress Notes (Signed)
Pre visit review using our clinic review tool, if applicable. No additional management support is needed unless otherwise documented below in the visit note. 

## 2014-09-05 NOTE — Progress Notes (Signed)
   Subjective:    Patient ID: Bonnie Gonzalez, female    DOB: 02-06-1956, 58 y.o.   MRN: [id number]  HPI Here to follow up an ER visit on 08-31-14 for vertigo. She has a long hx of this which comes and goes, but she has had it steadily now for the past 3 weeks. It makes her mildly nauseated. No headache or other neurologic deficits. At the ER her labs were okay except high glucose. Her BP that day was high but it is back to normal today. She was given IV fluids, Zofran, and Meclizine that day and it helped, but the dizziness has persisted. She has had good results taking antihistamines for this in the past but she is not taking these now. She has been working every day.    Review of Systems  Constitutional: Negative.   HENT: Negative.   Eyes: Negative.   Respiratory: Negative.   Cardiovascular: Negative.   Neurological: Positive for dizziness. Negative for tremors, seizures, syncope, facial asymmetry, speech difficulty, weakness, light-headedness, numbness and headaches.       Objective:   Physical Exam  Constitutional: She is oriented to person, place, and time. She appears well-developed and well-nourished. No distress.  Gait is normal   HENT:  Head: Normocephalic and atraumatic.  Right Ear: External ear normal.  Left Ear: External ear normal.  Nose: Nose normal.  Mouth/Throat: Oropharynx is clear and moist.  Eyes: Conjunctivae and EOM are normal. Pupils are equal, round, and reactive to light.  Neck: No thyromegaly present.  Cardiovascular: Normal rate, regular rhythm, normal heart sounds and intact distal pulses.   Pulmonary/Chest: Effort normal and breath sounds normal.  Lymphadenopathy:    She has no cervical adenopathy.  Neurological: She is alert and oriented to person, place, and time. She has normal reflexes. No cranial nerve deficit. She exhibits normal muscle tone. Coordination normal.          Assessment & Plan:  Benign vertigo. Start on Allegra 180 mg bid for the  next week, drink fluids and use meclizine prn. Given a steroid shot today. Recheck in one week if not better

## 2014-09-20 ENCOUNTER — Telehealth: Payer: Self-pay | Admitting: Family Medicine

## 2014-09-20 NOTE — Telephone Encounter (Signed)
Can you give pt this information?

## 2014-09-20 NOTE — Telephone Encounter (Signed)
Try [name] (I don't have the number)

## 2014-09-20 NOTE — Telephone Encounter (Addendum)
:   [address] [place], [place] [address]  [phone]  yes, pt aware

## 2014-09-20 NOTE — Telephone Encounter (Signed)
Pt states dr fry gave her his chiropractor number, but she has lost number. Can you give me name and number? Pt has back pain

## 2014-11-10 ENCOUNTER — Ambulatory Visit (INDEPENDENT_AMBULATORY_CARE_PROVIDER_SITE_OTHER): Payer: PRIVATE HEALTH INSURANCE | Admitting: Endocrinology

## 2014-11-10 ENCOUNTER — Encounter: Payer: Self-pay | Admitting: Endocrinology

## 2014-11-10 VITALS — BP 132/82 | HR 77 | Temp 98.3°F | Ht 60.0 in | Wt 253.0 lb

## 2014-11-10 DIAGNOSIS — Z23 Encounter for immunization: Secondary | ICD-10-CM

## 2014-11-10 DIAGNOSIS — E119 Type 2 diabetes mellitus without complications: Secondary | ICD-10-CM

## 2014-11-10 NOTE — Progress Notes (Signed)
Subjective:    Patient ID: Bonnie Gonzalez, female    DOB: [birth year], 58 y.o.   MRN: [id number]  HPI  Pt returns for f/u of diabetes mellitus: DM type: Insulin-requiring type 2 Dx'ed: 7672 Complications:  Therapy: insulin since 2012 GDM: never DKA: never Severe hypoglycemia: never Pancreatitis: never Other: she chose a simple insulin regimen; she cannot afford weight-loss surgery Interval history: She takes novolog, only 70 units with supper.  She takes lantus 200/d, as rx'ed.  she brings a record of her cbg's which i have reviewed today.  It varies from 116-328.  It is in general higher as the day goes on, but she does not check at HS.  Since last ov, she had 1 episode of hypoglycemia, and this was mild.  It happened in the afternoon, after she missed lunch.   Past Medical History  Diagnosis Date  . Depression   . GERD (gastroesophageal reflux disease)   . Anxiety   . Allergy   . Diabetes mellitus   . Tuberculosis     hydrothyroidism  . Insomnia   . IBS (irritable bowel syndrome)     Past Surgical History  Procedure Laterality Date  . Benign tumor       removed from groin  . Wrist arthrocentesis N/A 2001    History   Social History  . Marital Status: Divorced    Spouse Name: N/A    Number of Children: N/A  . Years of Education: N/A   Occupational History  . Not on file.   Social History Main Topics  . Smoking status: Former Smoker    Quit date: 12/01/2008  . Smokeless tobacco: Never Used  . Alcohol Use: No  . Drug Use: No  . Sexual Activity: Not on file   Other Topics Concern  . Not on file   Social History Narrative   Lives alone.   She has four grown children.   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   Highest level of education:  1.5 years of college    Current Outpatient Prescriptions on File Prior to Visit  Medication Sig Dispense Refill  . alendronate (FOSAMAX) 70 MG tablet Take 1 tablet (70 mg total) by mouth every  7 (seven) days. Take with a full glass of water on an empty stomach. 12 tablet 3  . ALPRAZolam (XANAX) 0.5 MG tablet Take 0.25-0.5 mg by mouth 3 (three) times daily as needed for anxiety.    . cyclobenzaprine (FLEXERIL) 10 MG tablet Take 10 mg by mouth 3 (three) times daily as needed for muscle spasms.    Marland Kitchen esomeprazole (NEXIUM) 40 MG capsule Take 40 mg by mouth daily at 12 noon.    . halobetasol (ULTRAVATE) 0.05 % cream Apply topically 2 (two) times daily. 50 g 3  . hydrOXYzine (ATARAX/VISTARIL) 25 MG tablet Take 25 mg by mouth 3 (three) times daily as needed for itching.    Marland Kitchen ibuprofen (ADVIL,MOTRIN) 600 MG tablet Take 600 mg by mouth every 6 (six) hours as needed (pain).     . insulin aspart (NOVOLOG) 100 UNIT/ML FlexPen Inject 70-75 Units into the skin daily with supper.    . Insulin Glargine (LANTUS SOLOSTAR) 100 UNIT/ML Solostar Pen Inject 200 Units into the skin daily.    . Lorcaserin HCl (BELVIQ) 10 MG TABS Take 10 mg by mouth daily.    . meclizine (ANTIVERT) 50 MG tablet Take 0.5 tablets (25 mg total) by mouth 3 (three) times daily  as needed. 30 tablet 0  . Multiple Vitamin (MULTIVITAMIN) capsule Take 1 capsule by mouth daily.    . Probiotic Product (PROBIOTIC PO) Take 1 capsule by mouth daily.    . rosuvastatin (CRESTOR) 20 MG tablet Take 20 mg by mouth daily.    . sertraline (ZOLOFT) 50 MG tablet Take 1 tablet (50 mg total) by mouth daily. 90 tablet 3  . temazepam (RESTORIL) 30 MG capsule Take 30 mg by mouth at bedtime as needed for sleep.    Marland Kitchen levothyroxine (SYNTHROID, LEVOTHROID) 150 MCG tablet Take 1 tablet (150 mcg total) by mouth daily. (Patient not taking: Reported on 11/10/2014) 90 tablet 3   No current facility-administered medications on file prior to visit.    Allergies  Allergen Reactions  . Codeine Hives  . Fluoxetine Hcl Other (See Comments)    Hair itching  . Hydrocodone-Acetaminophen Hives  . Oxycodone-Acetaminophen Hives  . Penicillins Hives  . Tramadol Hcl  Other (See Comments)    unknown    Family History  Problem Relation Age of Onset  . Asthma      fhx  . Depression      fhx  . Diabetes      fhx  . Parkinsonism      fhx  . Lupus      fhx  . Alzheimer's disease Father     Deceased, 21  . Diabetes Mellitus II Father   . Arthritis/Rheumatoid Mother     Living, 24  . Diabetes Mellitus II Sister   . Diabetes Mellitus II Brother     BP 132/82 mmHg  Pulse 77  Temp(Src) 98.3 F (36.8 C) (Oral)  Ht 5' (1.524 m)  Wt 253 lb (114.76 kg)  BMI 49.41 kg/m2  SpO2 94%    Review of Systems She denies LOC.  She has gained weight    Objective:   Physical Exam VITAL SIGNS:  See vs page GENERAL: no distress Pulses: dorsalis pedis intact bilat.   Feet: no deformity.  no edema Skin:  no ulcer on the feet.  normal color and temp. Neuro: sensation is intact to touch on the feet   Lab Results  Component Value Date   HGBA1C 8.7* 11/10/2014      Assessment & Plan:  DM: moderate exacerbation.  Side-effect of rx: hypoglycemia: mild Noncompliance with cbg recording at HS: I'll work around this as best I can  Weight gain: she is encouraged to re-lose.  Patient is advised the following: Patient Instructions  check your blood sugar twice a day.  vary the time of day when you check, between before the 3 meals, and at bedtime.  also check if you have symptoms of your blood sugar being too high or too low.  please keep a record of the readings and bring it to your next appointment here.  please call us sooner if your blood sugar goes below 70, or if it stays over 200.  The bedtime is a really important time of day to check, as this is after your evening shot.   A diabetes blood test isrequested for you today.  We'll contact you with results.   Please come back for a follow-up appointment in 3 months.   On this type of insulin schedule, you should eat meals on a regular schedule.  If a meal is missed or significantly delayed, your blood  sugar could go low.     increase the lantus to 230 units each morning.

## 2014-11-10 NOTE — Patient Instructions (Addendum)
check your blood sugar twice a day.  vary the time of day when you check, between before the 3 meals, and at bedtime.  also check if you have symptoms of your blood sugar being too high or too low.  please keep a record of the readings and bring it to your next appointment here.  please call us sooner if your blood sugar goes below 70, or if it stays over 200.  The bedtime is a really important time of day to check, as this is after your evening shot.   A diabetes blood test isrequested for you today.  We'll contact you with results.   Please come back for a follow-up appointment in 3 months.   On this type of insulin schedule, you should eat meals on a regular schedule.  If a meal is missed or significantly delayed, your blood sugar could go low.

## 2014-11-11 LAB — HEMOGLOBIN A1C
Hgb A1c MFr Bld: 8.7 % — ABNORMAL HIGH (ref <5.7)
Mean Plasma Glucose: 203 mg/dL — ABNORMAL HIGH (ref <117)

## 2014-11-13 ENCOUNTER — Telehealth: Payer: Self-pay | Admitting: Family Medicine

## 2014-11-13 ENCOUNTER — Other Ambulatory Visit: Payer: Self-pay

## 2014-11-13 MED ORDER — INSULIN GLARGINE 100 UNIT/ML SOLOSTAR PEN: 230.0000 [IU] | PEN_INJECTOR | Freq: Every day | SUBCUTANEOUS | Status: DC

## 2014-11-13 NOTE — Telephone Encounter (Signed)
Pt called to request a rx for hydrOXYzine (ATARAX/VISTARIL) 25 MG tablet . She said she received the tablet last time but is requesting a capsule    [facility]

## 2014-11-13 NOTE — Telephone Encounter (Signed)
Call in Hydroxyzine 25 mg CAPSULE to take tid prn itching, #90 with 5 rf

## 2014-11-14 MED ORDER — HYDROXYZINE PAMOATE 25 MG PO CAPS: 25.0000 mg | ORAL_CAPSULE | Freq: Three times a day (TID) | ORAL | Status: DC | PRN

## 2014-11-14 NOTE — Telephone Encounter (Signed)
I sent script e-scribe. 

## 2014-12-08 ENCOUNTER — Telehealth: Payer: Self-pay | Admitting: Family Medicine

## 2014-12-08 ENCOUNTER — Encounter: Payer: Self-pay | Admitting: Family Medicine

## 2014-12-08 ENCOUNTER — Ambulatory Visit (INDEPENDENT_AMBULATORY_CARE_PROVIDER_SITE_OTHER): Payer: PRIVATE HEALTH INSURANCE | Admitting: Family Medicine

## 2014-12-08 VITALS — BP 164/79 | HR 98 | Temp 99.2°F | Ht 60.0 in | Wt 239.0 lb

## 2014-12-08 DIAGNOSIS — F329 Major depressive disorder, single episode, unspecified: Secondary | ICD-10-CM

## 2014-12-08 DIAGNOSIS — F32A Depression, unspecified: Secondary | ICD-10-CM

## 2014-12-08 MED ORDER — ALBUTEROL SULFATE HFA 108 (90 BASE) MCG/ACT IN AERS: 2.0000 | INHALATION_SPRAY | RESPIRATORY_TRACT | Status: DC | PRN

## 2014-12-08 MED ORDER — SERTRALINE HCL 100 MG PO TABS: 100.0000 mg | ORAL_TABLET | Freq: Every day | ORAL | Status: DC

## 2014-12-08 MED ORDER — ALPRAZOLAM 0.5 MG PO TABS: 0.5000 mg | ORAL_TABLET | Freq: Three times a day (TID) | ORAL | Status: DC | PRN

## 2014-12-08 NOTE — Progress Notes (Signed)
Pre visit review using our clinic review tool, if applicable. No additional management support is needed unless otherwise documented below in the visit note. 

## 2014-12-08 NOTE — Telephone Encounter (Signed)
Patient called crying stating that she is having a hard time at her job.  She was in a team meeting and they were telling her about things she never remembered doing or saying. She thinks her Zoloft isn't working and having a break down. Patient is scheduled for today at 2.

## 2014-12-11 ENCOUNTER — Encounter: Payer: Self-pay | Admitting: Family Medicine

## 2014-12-11 NOTE — Progress Notes (Signed)
   Subjective:    Patient ID: Bonnie Gonzalez, female    DOB: 1956-11-22, 59 y.o.   MRN: [id number]  HPI Here to talk about her anxiety and depression, which have worsened a bit over the past 6 months. No real changes in her life. She has been on Zoloft 50 mg daily for years. She has felt more sad lately, and she worries about any things. Her sleep is about the same.   Review of Systems  Constitutional: Negative.   Psychiatric/Behavioral: Positive for dysphoric mood and decreased concentration. Negative for behavioral problems, confusion and agitation. The patient is nervous/anxious.        Objective:   Physical Exam  Constitutional: She is oriented to person, place, and time. She appears well-developed and well-nourished.  Neurological: She is alert and oriented to person, place, and time.  Psychiatric: She has a normal mood and affect. Her behavior is normal. Judgment and thought content normal.          Assessment & Plan:  We will increase her Zoloft to 100 mg daily and add Xanax prn. Recheck in 3 weeks.

## 2014-12-14 ENCOUNTER — Encounter: Payer: Self-pay | Admitting: Family Medicine

## 2014-12-27 ENCOUNTER — Telehealth: Payer: Self-pay | Admitting: Family Medicine

## 2014-12-27 MED ORDER — SERTRALINE HCL 100 MG PO TABS: 100.0000 mg | ORAL_TABLET | Freq: Every day | ORAL | Status: DC

## 2014-12-27 MED ORDER — ALPRAZOLAM 0.5 MG PO TABS: 0.5000 mg | ORAL_TABLET | Freq: Two times a day (BID) | ORAL | Status: DC | PRN

## 2014-12-27 NOTE — Telephone Encounter (Signed)
Ready to be faxed.

## 2014-12-27 NOTE — Telephone Encounter (Signed)
FYI pt is calling to report zoloft is working and thanks for chiropractor recommendation. Pt needs new rxs sent to  [facility] mail order zoloft 100 mg # 90 and alprazolam 0.5 mg #180 with refills. Pt states she is only taking alprazolam twice a day instead of 3 times a day.

## 2014-12-28 NOTE — Telephone Encounter (Signed)
Script was faxed.

## 2015-01-08 ENCOUNTER — Telehealth: Payer: Self-pay | Admitting: Family Medicine

## 2015-01-08 NOTE — Telephone Encounter (Signed)
Pt has an appt for tomorrow for right side pain. Pt would like to see md today. Can I create slot?

## 2015-01-08 NOTE — Telephone Encounter (Signed)
Per Dr. Sarajane Jews we can see her on Tuesday 01/08/15 and I spoke with pt.

## 2015-01-09 ENCOUNTER — Encounter: Payer: Self-pay | Admitting: Family Medicine

## 2015-01-09 ENCOUNTER — Ambulatory Visit (INDEPENDENT_AMBULATORY_CARE_PROVIDER_SITE_OTHER): Payer: PRIVATE HEALTH INSURANCE | Admitting: Family Medicine

## 2015-01-09 ENCOUNTER — Ambulatory Visit: Payer: PRIVATE HEALTH INSURANCE | Admitting: Family Medicine

## 2015-01-09 VITALS — BP 158/82 | HR 86 | Temp 99.2°F | Ht 60.0 in | Wt 238.0 lb

## 2015-01-09 DIAGNOSIS — N3000 Acute cystitis without hematuria: Secondary | ICD-10-CM

## 2015-01-09 LAB — POCT URINALYSIS DIPSTICK
Bilirubin, UA: NEGATIVE
Blood, UA: NEGATIVE
Glucose, UA: NEGATIVE
Ketones, UA: NEGATIVE
Nitrite, UA: NEGATIVE
Spec Grav, UA: 1.01
Urobilinogen, UA: 0.2
pH, UA: 7.5

## 2015-01-09 MED ORDER — CIPROFLOXACIN HCL 500 MG PO TABS: 500.0000 mg | ORAL_TABLET | Freq: Two times a day (BID) | ORAL | Status: DC

## 2015-01-09 NOTE — Addendum Note (Signed)
Addended by: Aggie Hacker A on: 01/09/2015 02:05 PM   Modules accepted: Orders

## 2015-01-09 NOTE — Progress Notes (Signed)
   Subjective:    Patient ID: Bonnie Gonzalez, female    DOB: 1956-11-12, 59 y.o.   MRN: [id number]  HPI Here for 3 days of LLQ and left flank pains with increased frequency of urinations. No back pain or fever. Her BMs are normal.    Review of Systems  Constitutional: Negative.   Gastrointestinal: Positive for abdominal pain. Negative for nausea, vomiting, diarrhea, constipation, blood in stool, abdominal distention, anal bleeding and rectal pain.  Genitourinary: Positive for urgency, frequency and flank pain. Negative for dysuria and hematuria.       Objective:   Physical Exam  Constitutional: She appears well-developed and well-nourished.  Pulmonary/Chest: Effort normal and breath sounds normal.  Abdominal: Soft. Bowel sounds are normal. She exhibits no distension and no mass. There is no tenderness. There is no rebound and no guarding.          Assessment & Plan:  Treat with Cipro. Await the culture. Drink plenty of water.

## 2015-01-09 NOTE — Progress Notes (Signed)
Pre visit review using our clinic review tool, if applicable. No additional management support is needed unless otherwise documented below in the visit note. 

## 2015-01-10 ENCOUNTER — Other Ambulatory Visit: Payer: Self-pay | Admitting: Endocrinology

## 2015-01-11 LAB — URINE CULTURE

## 2015-02-07 ENCOUNTER — Ambulatory Visit (INDEPENDENT_AMBULATORY_CARE_PROVIDER_SITE_OTHER): Payer: PRIVATE HEALTH INSURANCE | Admitting: Family Medicine

## 2015-02-07 ENCOUNTER — Encounter: Payer: Self-pay | Admitting: Family Medicine

## 2015-02-07 VITALS — BP 150/80 | HR 86 | Temp 98.7°F | Ht 60.0 in | Wt 238.0 lb

## 2015-02-07 DIAGNOSIS — N3000 Acute cystitis without hematuria: Secondary | ICD-10-CM | POA: Diagnosis not present

## 2015-02-07 DIAGNOSIS — L989 Disorder of the skin and subcutaneous tissue, unspecified: Secondary | ICD-10-CM

## 2015-02-07 LAB — POCT URINALYSIS DIPSTICK
BILIRUBIN UA: NEGATIVE
Blood, UA: NEGATIVE
Glucose, UA: NEGATIVE
KETONES UA: NEGATIVE
Nitrite, UA: NEGATIVE
Spec Grav, UA: 1.025
Urobilinogen, UA: 0.2
pH, UA: 6

## 2015-02-07 MED ORDER — NITROFURANTOIN MONOHYD MACRO 100 MG PO CAPS
100.0000 mg | ORAL_CAPSULE | Freq: Two times a day (BID) | ORAL | Status: DC
Start: 2015-02-07 — End: 2015-04-11

## 2015-02-07 NOTE — Addendum Note (Signed)
Addended by: Aggie Hacker A on: 02/07/2015 10:58 AM   Modules accepted: Orders

## 2015-02-07 NOTE — Progress Notes (Signed)
Pre visit review using our clinic review tool, if applicable. No additional management support is needed unless otherwise documented below in the visit note. 

## 2015-02-07 NOTE — Progress Notes (Signed)
   Subjective:    Patient ID: Bonnie Gonzalez, female    DOB: Apr 07, 1956, 59 y.o.   MRN: [id number]  HPI Here to remove a skin lesion in the right axilla and she also describes some residual urgency and burning on urinations. Last month we gave her Cipro for this and she felt better but the sx never completely went away. The culture could not isolate a bacteria.    Review of Systems  Constitutional: Negative.   Genitourinary: Positive for dysuria, urgency and frequency.       Objective:   Physical Exam  Constitutional: She appears well-developed and well-nourished.  Abdominal: Soft. Bowel sounds are normal. She exhibits no distension and no mass. There is no tenderness. There is no rebound and no guarding.  Skin:  The right axilla has a 1.3 cm fleshy papular lesion           Assessment & Plan:  Treat the UTI with Macrobid. As for the skin lesion, the area was cleansed with Betadine and LA was achieved using 2% Xylocaine with epinephrine. An eliptical incision was made around the lesion with a scalpel down to the subcutaneous fat layer, and it was removed. This will be sent to Pathology. The wound was closed with 5 sutures of 4-0 Ethilon. It was dressed with Neosporin and a Bandaid. She will return in 14 days to have the sutures removed.

## 2015-02-10 ENCOUNTER — Encounter: Payer: Self-pay | Admitting: Family

## 2015-02-10 ENCOUNTER — Ambulatory Visit (INDEPENDENT_AMBULATORY_CARE_PROVIDER_SITE_OTHER): Payer: PRIVATE HEALTH INSURANCE | Admitting: Family

## 2015-02-10 VITALS — BP 130/84 | HR 80 | Temp 98.2°F | Wt 235.0 lb

## 2015-02-10 DIAGNOSIS — J029 Acute pharyngitis, unspecified: Secondary | ICD-10-CM

## 2015-02-10 LAB — POCT RAPID STREP A (OFFICE): Rapid Strep A Screen: NEGATIVE

## 2015-02-10 NOTE — Assessment & Plan Note (Signed)
Symptoms and exam consistent with viral pharyngitis. In office rapid strep test is negative. Continue supportive care at this time with over the counter medications as needed for symptom relief. Follow up if symptoms worsen or fail to improve.

## 2015-02-10 NOTE — Progress Notes (Signed)
Pre visit review using our clinic review tool, if applicable. No additional management support is needed unless otherwise documented below in the visit note. 

## 2015-02-10 NOTE — Progress Notes (Signed)
Subjective:    Patient ID: Bonnie Gonzalez, female    DOB: 1955/12/25, 59 y.o.   MRN: [id number]  Chief Complaint  Patient presents with  . Sore Throat    this morning--saw white patch on left side of throat--difficulty swallowing--pt has been going to the hospital to see her ill mother--pt is currenlty taking Macrobid for UTI---    HPI:  Bonnie Gonzalez is a 59 y.o. female who presents today for an acute visit.   This is a new problem. Associated symptoms of sore throat, difficulty swallowing and a white patch on the left inside of her throat has been going on for about 1 day. Notes that she has been visiting her mother at the hospital and has some concern. She is currently taking Macrobid for a UTI for which she was seen 3 days ago.   Allergies  Allergen Reactions  . Codeine Hives  . Fluoxetine Hcl Other (See Comments)    Hair itching  . Hydrocodone-Acetaminophen Hives  . Oxycodone-Acetaminophen Hives  . Penicillins Hives  . Tramadol Hcl Other (See Comments)    unknown    Current Outpatient Prescriptions on File Prior to Visit  Medication Sig Dispense Refill  . albuterol (PROVENTIL HFA;VENTOLIN HFA) 108 (90 BASE) MCG/ACT inhaler Inhale 2 puffs into the lungs every 4 (four) hours as needed for wheezing or shortness of breath. 1 Inhaler 2  . alendronate (FOSAMAX) 70 MG tablet Take 1 tablet (70 mg total) by mouth every 7 (seven) days. Take with a full glass of water on an empty stomach. 12 tablet 3  . cyclobenzaprine (FLEXERIL) 10 MG tablet Take 10 mg by mouth 3 (three) times daily as needed for muscle spasms.    Marland Kitchen esomeprazole (NEXIUM) 40 MG capsule Take 40 mg by mouth daily at 12 noon.    . halobetasol (ULTRAVATE) 0.05 % cream Apply topically 2 (two) times daily. 50 g 3  . hydrOXYzine (VISTARIL) 25 MG capsule Take 1 capsule (25 mg total) by mouth 3 (three) times daily as needed. 180 capsule 1  . ibuprofen (ADVIL,MOTRIN) 600 MG tablet Take 600 mg by mouth every 6 (six) hours as  needed (pain).     . insulin aspart (NOVOLOG) 100 UNIT/ML FlexPen Inject 90 Units into the skin daily with supper.     . Insulin Glargine (LANTUS SOLOSTAR) 100 UNIT/ML Solostar Pen Inject 230 Units into the skin daily. 70 pen 1  . levothyroxine (SYNTHROID, LEVOTHROID) 150 MCG tablet Take 1 tablet (150 mcg total) by mouth daily. 90 tablet 3  . Lorcaserin HCl (BELVIQ) 10 MG TABS Take 10 mg by mouth daily.    . meclizine (ANTIVERT) 50 MG tablet Take 0.5 tablets (25 mg total) by mouth 3 (three) times daily as needed. 30 tablet 0  . Multiple Vitamin (MULTIVITAMIN) capsule Take 1 capsule by mouth daily.    . nitrofurantoin, macrocrystal-monohydrate, (MACROBID) 100 MG capsule Take 1 capsule (100 mg total) by mouth 2 (two) times daily. 20 capsule 0  . NOVOLOG FLEXPEN 100 UNIT/ML FlexPen INJECT 90 UNITS INTO THE   SKIN DAILY WITH SUPPER 90 mL 1  . Probiotic Product (PROBIOTIC PO) Take 1 capsule by mouth daily.    . rosuvastatin (CRESTOR) 20 MG tablet Take 20 mg by mouth daily.    . sertraline (ZOLOFT) 100 MG tablet Take 1 tablet (100 mg total) by mouth daily. 90 tablet 3  . temazepam (RESTORIL) 30 MG capsule Take 30 mg by mouth at bedtime as needed for  sleep.    Marland Kitchen ALPRAZolam (XANAX) 0.5 MG tablet Take 1 tablet (0.5 mg total) by mouth 2 (two) times daily as needed for anxiety. (Patient not taking: Reported on 02/10/2015) 180 tablet 1   No current facility-administered medications on file prior to visit.    Review of Systems  Constitutional: Negative for fever and chills.  HENT: Positive for congestion, sinus pressure and sore throat.   Respiratory: Positive for cough. Negative for chest tightness and shortness of breath.   Neurological: Negative for headaches.      Objective:    BP 130/84 mmHg  Pulse 80  Temp(Src) 98.2 F (36.8 C) (Oral)  Wt 235 lb (106.595 kg)  SpO2 97% Nursing note and vital signs reviewed.  Physical Exam  Constitutional: She is oriented to person, place, and time. She  appears well-developed and well-nourished. No distress.  HENT:  Right Ear: Hearing, tympanic membrane, external ear and ear canal normal.  Left Ear: Hearing, tympanic membrane, external ear and ear canal normal.  Nose: Nose normal. Right sinus exhibits no maxillary sinus tenderness and no frontal sinus tenderness. Left sinus exhibits no maxillary sinus tenderness and no frontal sinus tenderness.  Mouth/Throat: Uvula is midline, oropharynx is clear and moist and mucous membranes are normal.  Cardiovascular: Normal rate, regular rhythm, normal heart sounds and intact distal pulses.   Pulmonary/Chest: Effort normal and breath sounds normal.  Neurological: She is alert and oriented to person, place, and time.  Skin: Skin is warm and dry.  Psychiatric: She has a normal mood and affect. Her behavior is normal. Judgment and thought content normal.       Assessment & Plan:

## 2015-02-10 NOTE — Patient Instructions (Signed)
Thank you for choosing [facility].  Summary/Instructions:  Your prescription(s) have been submitted to your pharmacy or been printed and provided for you. Please take as directed and contact our office if you believe you are having problem(s) with the medication(s) or have any questions.   If your symptoms worsen or fail to improve, please contact our office for further instruction, or in case of emergency go directly to the emergency room at the closest medical facility.   General Recommendations:    Please drink plenty of fluids.  Get plenty of rest   Sleep in humidified air  Use saline nasal sprays  Netti pot   OTC Medications:  Decongestants - helps relieve congestion   Flonase (generic fluticasone) or Nasacort (generic triamcinolone) - please make sure to use the "cross-over" technique at a 45 degree angle towards the opposite eye as opposed to straight up the nasal passageway.   Sudafed (generic pseudoephedrine - Note this is the one that is available behind the pharmacy counter); Products with phenylephrine (-PE) may also be used but is often not as effective as pseudoephedrine.   If you have HIGH BLOOD PRESSURE - Coricidin HBP; AVOID any product that is -D as this contains pseudoephedrine which may increase your blood pressure.  Afrin (oxymetazoline) every 6-8 hours for up to 3 days.   Allergies - helps relieve runny nose, itchy eyes and sneezing   Claritin (generic loratidine), Allegra (fexofenidine), or Zyrtec (generic cyrterizine) for runny nose. These medications should not cause drowsiness.  Note - Benadryl (generic diphenhydramine) may be used however may cause drowsiness  Cough -   Delsym or Robitussin (generic dextromethorphan)  Expectorants - helps loosen mucus to ease removal   Mucinex (generic guaifenesin) as directed on the package.  Headaches / General Aches   Tylenol (generic acetaminophen) - DO NOT EXCEED 3 grams (3,000 mg) in a 24  hour time period  Advil/Motrin (generic ibuprofen)   Sore Throat -   Salt water gargle   Chloraseptic (generic benzocaine) spray or lozenges / Sucrets (generic dyclonine)      Pharyngitis Pharyngitis is redness, pain, and swelling (inflammation) of your pharynx.  CAUSES  Pharyngitis is usually caused by infection. Most of the time, these infections are from viruses (viral) and are part of a cold. However, sometimes pharyngitis is caused by bacteria (bacterial). Pharyngitis can also be caused by allergies. Viral pharyngitis may be spread from person to person by coughing, sneezing, and personal items or utensils (cups, forks, spoons, toothbrushes). Bacterial pharyngitis may be spread from person to person by more intimate contact, such as kissing.  SIGNS AND SYMPTOMS  Symptoms of pharyngitis include:  4. Sore throat.  5. Tiredness (fatigue).  6. Low-grade fever.  7. Headache. 8. Joint pain and muscle aches. 9. Skin rashes. 10. Swollen lymph nodes. 11. Plaque-like film on throat or tonsils (often seen with bacterial pharyngitis). DIAGNOSIS  Your health care provider will ask you questions about your illness and your symptoms. Your medical history, along with a physical exam, is often all that is needed to diagnose pharyngitis. Sometimes, a rapid strep test is done. Other lab tests may also be done, depending on the suspected cause.  TREATMENT  Viral pharyngitis will usually get better in 3-4 days without the use of medicine. Bacterial pharyngitis is treated with medicines that kill germs (antibiotics).  HOME CARE INSTRUCTIONS  2. Drink enough water and fluids to keep your urine clear or pale yellow.  3. Only take over-the-counter or prescription medicines  as directed by your health care provider:  1. If you are prescribed antibiotics, make sure you finish them even if you start to feel better.  2. Do not take aspirin.  4. Get lots of rest.  5. Gargle with 8 oz of salt  water ( tsp of salt per 1 qt of water) as often as every 1-2 hours to soothe your throat.  6. Throat lozenges (if you are not at risk for choking) or sprays may be used to soothe your throat. SEEK MEDICAL CARE IF:  2. You have large, tender lumps in your neck. 3. You have a rash. 4. You cough up green, yellow-brown, or bloody spit. SEEK IMMEDIATE MEDICAL CARE IF:  3. Your neck becomes stiff. 4. You drool or are unable to swallow liquids. 5. You vomit or are unable to keep medicines or liquids down. 6. You have severe pain that does not go away with the use of recommended medicines. 7. You have trouble breathing (not caused by a stuffy nose). MAKE SURE YOU:  3. Understand these instructions. 4. Will watch your condition. 5. Will get help right away if you are not doing well or get worse. Document Released: 11/17/2005 Document Revised: 09/07/2013 Document Reviewed: 07/25/2013 [facility] Patient Information 2015 [place], [place]. This information is not intended to replace advice given to you by your health care provider. Make sure you discuss any questions you have with your health care provider.

## 2015-02-12 ENCOUNTER — Telehealth: Payer: Self-pay | Admitting: Family Medicine

## 2015-02-12 NOTE — Telephone Encounter (Signed)
Pt would like to know if you would prescribe her asthalin inhaler And tessalon pearls.  Pt states she has nasal congestion that will not go away. Pt has been taking mussinex and allegra. Would like to know if she should continue if you precribe the asthalin? [facility]

## 2015-02-13 NOTE — Telephone Encounter (Signed)
Call in Astelin nasal sprays, use 2 sprays each nostril bid, #one bottle with 5 rf. Also call in Benzonatate 200 mg bid prn cough, #60 with 5 rf

## 2015-02-14 MED ORDER — BENZONATATE 200 MG PO CAPS: 200.0000 mg | ORAL_CAPSULE | Freq: Two times a day (BID) | ORAL | Status: DC | PRN

## 2015-02-14 MED ORDER — AZELASTINE HCL 0.1 % NA SOLN: 2.0000 | Freq: Two times a day (BID) | NASAL | Status: DC

## 2015-02-14 NOTE — Telephone Encounter (Signed)
I sent scripts e-scribe for Astelin & Benzonatate and spoke with pt.

## 2015-02-14 NOTE — Telephone Encounter (Signed)
Pt called back to request an alternate/generic for her  rosuvastatin (CRESTOR) 20 MG tablet. [facility] advised pt this rx is now $40.  [facility] to [facility]

## 2015-02-21 ENCOUNTER — Ambulatory Visit (INDEPENDENT_AMBULATORY_CARE_PROVIDER_SITE_OTHER): Payer: PRIVATE HEALTH INSURANCE | Admitting: Family Medicine

## 2015-02-21 ENCOUNTER — Encounter: Payer: Self-pay | Admitting: Family Medicine

## 2015-02-21 VITALS — BP 124/70 | Temp 98.5°F | Wt 237.0 lb

## 2015-02-21 DIAGNOSIS — L989 Disorder of the skin and subcutaneous tissue, unspecified: Secondary | ICD-10-CM

## 2015-02-21 MED ORDER — ATORVASTATIN CALCIUM 40 MG PO TABS: 40.0000 mg | ORAL_TABLET | Freq: Every day | ORAL | Status: DC

## 2015-02-21 NOTE — Progress Notes (Signed)
   Subjective:    Patient ID: Bonnie Gonzalez, female    DOB: [birth year], 59 y.o.   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 The wound is healing well and does not bother her.    Review of Systems  Constitutional: Negative.        Objective:   Physical Exam  Constitutional: She appears well-developed and well-nourished.  Skin:  The wound is healing and is clean          Assessment & Plan:  All sutures were removed. Recheck prn

## 2015-02-24 ENCOUNTER — Other Ambulatory Visit: Payer: Self-pay | Admitting: Endocrinology

## 2015-03-01 ENCOUNTER — Other Ambulatory Visit: Payer: Self-pay | Admitting: Family Medicine

## 2015-03-01 NOTE — Telephone Encounter (Signed)
PCP not in office  Can  Disp 14#  Further refills per dr fry.

## 2015-03-05 NOTE — Telephone Encounter (Signed)
Pt request refill temazepam (RESTORIL) 30 MG capsule  Pt never got the 14 tabs and would just like the entire rx. Pt is out and would like sent to  [facility]

## 2015-03-07 NOTE — Telephone Encounter (Signed)
Refill for 6 months. 

## 2015-04-11 ENCOUNTER — Ambulatory Visit (INDEPENDENT_AMBULATORY_CARE_PROVIDER_SITE_OTHER): Payer: PRIVATE HEALTH INSURANCE | Admitting: Family Medicine

## 2015-04-11 ENCOUNTER — Encounter: Payer: Self-pay | Admitting: Family Medicine

## 2015-04-11 VITALS — BP 158/80 | HR 82 | Temp 98.7°F | Ht 60.0 in | Wt 235.0 lb

## 2015-04-11 DIAGNOSIS — B3749 Other urogenital candidiasis: Secondary | ICD-10-CM

## 2015-04-11 MED ORDER — KETOCONAZOLE 2 % EX CREA: 1.0000 "application " | TOPICAL_CREAM | Freq: Two times a day (BID) | CUTANEOUS | Status: DC

## 2015-04-11 MED ORDER — TERBINAFINE HCL 250 MG PO TABS: 250.0000 mg | ORAL_TABLET | Freq: Every day | ORAL | Status: DC

## 2015-04-11 NOTE — Progress Notes (Signed)
   Subjective:    Patient ID: Bonnie Gonzalez, female    DOB: 04/09/1956, 59 y.o.   MRN: [id number]  HPI Here for one week of an itchy painful rash on the lower abdomen and in the groins.    Review of Systems  Constitutional: Negative.   Skin: Positive for rash.       Objective:   Physical Exam  Constitutional: She appears well-developed and well-nourished.  Skin:  Red macular rash in above locations           Assessment & Plan:  Candidiasis. Treat with Lamisil tablets and ketoconazole cream.

## 2015-04-11 NOTE — Progress Notes (Signed)
Pre visit review using our clinic review tool, if applicable. No additional management support is needed unless otherwise documented below in the visit note. 

## 2015-04-13 ENCOUNTER — Other Ambulatory Visit: Payer: Self-pay

## 2015-04-13 MED ORDER — INSULIN GLARGINE 100 UNIT/ML SOLOSTAR PEN: 230.0000 [IU] | PEN_INJECTOR | Freq: Every day | SUBCUTANEOUS | Status: DC

## 2015-05-07 ENCOUNTER — Other Ambulatory Visit: Payer: Self-pay | Admitting: Family Medicine

## 2015-05-15 ENCOUNTER — Other Ambulatory Visit: Payer: Self-pay | Admitting: Family Medicine

## 2015-05-21 ENCOUNTER — Telehealth: Payer: Self-pay | Admitting: Family Medicine

## 2015-05-21 DIAGNOSIS — G4733 Obstructive sleep apnea (adult) (pediatric): Secondary | ICD-10-CM

## 2015-05-21 NOTE — Telephone Encounter (Signed)
Pt thinks she may have sleep apnea and would to have test. Please advise

## 2015-05-23 NOTE — Telephone Encounter (Signed)
I referred her to Pulmonary to evaluate this

## 2015-05-23 NOTE — Telephone Encounter (Signed)
I spoke with pt  

## 2015-05-29 ENCOUNTER — Other Ambulatory Visit: Payer: Self-pay | Admitting: Family Medicine

## 2015-05-29 NOTE — Telephone Encounter (Signed)
Can we refill this? 

## 2015-06-09 ENCOUNTER — Other Ambulatory Visit: Payer: Self-pay | Admitting: Endocrinology

## 2015-06-11 NOTE — Telephone Encounter (Signed)
Please advise if ok to refill. Last office visit was 11/09/14.

## 2015-06-18 ENCOUNTER — Telehealth: Payer: Self-pay | Admitting: Family Medicine

## 2015-06-18 NOTE — Telephone Encounter (Signed)
[facility] - Client [place] [facility]  Patient Name: Bonnie Gonzalez  DOB: 1956/04/01    Initial Comment Caller states she might have pink eye. Left side of he eye and face is swollen. She has been around someone who has it. She is not able to see in her eye to see if its red. Small dry discharge from left eye also   Nurse Assessment  Nurse: Justine Null, RN, Rodena Piety Date/Time (Eastern Time): 06/18/2015 2:55:01 PM  Confirm and document reason for call. If symptomatic, describe symptoms. ---Caller states she might have pink eye. Left side of he eye and face is swollen. She has been around someone who has it. She is not able to see in her eye to see if its red. Small dry discharge from left eye also caller stated that she has had no fevers and has itchiness on the left eye and ear and has some facial swelling on the left side that started yesterday and no bites on her face and no tooth pain  Has the patient traveled out of the country within the last 30 days? ---No  Does the patient require triage? ---Yes  Related visit to physician within the last 2 weeks? ---No  Does the PT have any chronic conditions? (i.e. diabetes, asthma, etc.) ---Yes  List chronic conditions. ---diabetes type II     Guidelines    Guideline Title Affirmed Question Affirmed Notes  Eye - Swelling MODERATE-SEVERE eyelid swelling on one side (Exception: due to a mosquito bite)    Final Disposition User   See Physician within [place], RN, Rodena Piety    Comments  call placed to the office back line at [phone] and spoke with [place] and informed her of the patient status and warm transferred the caller to the office line   Referrals  REFERRED TO PCP OFFICE

## 2015-06-19 ENCOUNTER — Encounter: Payer: Self-pay | Admitting: Family Medicine

## 2015-06-19 ENCOUNTER — Ambulatory Visit (INDEPENDENT_AMBULATORY_CARE_PROVIDER_SITE_OTHER): Payer: PRIVATE HEALTH INSURANCE | Admitting: Family Medicine

## 2015-06-19 VITALS — BP 150/80 | HR 88 | Temp 98.3°F | Resp 16 | Wt 236.9 lb

## 2015-06-19 DIAGNOSIS — L259 Unspecified contact dermatitis, unspecified cause: Secondary | ICD-10-CM

## 2015-06-19 DIAGNOSIS — R3915 Urgency of urination: Secondary | ICD-10-CM | POA: Diagnosis not present

## 2015-06-19 DIAGNOSIS — N390 Urinary tract infection, site not specified: Secondary | ICD-10-CM

## 2015-06-19 LAB — POCT URINALYSIS DIPSTICK
Bilirubin, UA: NEGATIVE
Blood, UA: NEGATIVE
Glucose, UA: NEGATIVE
KETONES UA: NEGATIVE
NITRITE UA: NEGATIVE
PH UA: 5.5
Protein, UA: NEGATIVE
SPEC GRAV UA: 1.015
Urobilinogen, UA: 0.2

## 2015-06-19 MED ORDER — METHYLPREDNISOLONE ACETATE 80 MG/ML IJ SUSP
120.0000 mg | Freq: Once | INTRAMUSCULAR | Status: AC
  Administered 2015-06-19: 120 mg via INTRAMUSCULAR

## 2015-06-19 MED ORDER — METHYLPREDNISOLONE 4 MG PO TBPK: ORAL_TABLET | ORAL | Status: DC

## 2015-06-19 MED ORDER — CIPROFLOXACIN HCL 500 MG PO TABS: 500.0000 mg | ORAL_TABLET | Freq: Two times a day (BID) | ORAL | Status: DC

## 2015-06-19 MED ORDER — ALBUTEROL SULFATE HFA 108 (90 BASE) MCG/ACT IN AERS: 2.0000 | INHALATION_SPRAY | RESPIRATORY_TRACT | Status: DC | PRN

## 2015-06-19 NOTE — Telephone Encounter (Signed)
Pt has an appt on 06/19/15 @ 10:30 with Dr. Sarajane Jews

## 2015-06-19 NOTE — Progress Notes (Signed)
   Subjective:    Patient ID: Bonnie Gonzalez, female    DOB: 07-17-1956, 59 y.o.   MRN: [id number]  HPI Here for several things. First she has had swelling and itching around the face for 3 days. The itching began around the left eye and now has spread to the left ear. Using Benadryl. Also she has had intermittent left sided mid back pains and urinary urgency for a week. No fever.   Review of Systems  Constitutional: Negative.   HENT: Positive for facial swelling.   Eyes: Negative.   Respiratory: Negative.   Cardiovascular: Negative.   Gastrointestinal: Negative.   Genitourinary: Positive for urgency and frequency. Negative for dysuria and flank pain.       Objective:   Physical Exam  Constitutional: She is oriented to person, place, and time. She appears well-developed and well-nourished. No distress.  HENT:  Right Ear: External ear normal.  Left Ear: External ear normal.  Nose: Nose normal.  Mouth/Throat: Oropharynx is clear and moist. No oropharyngeal exudate.  Eyes: Conjunctivae and EOM are normal.  Neck: Neck supple. No thyromegaly present.  Cardiovascular: Normal rate, regular rhythm, normal heart sounds and intact distal pulses.   Pulmonary/Chest: Effort normal and breath sounds normal.  Abdominal: Soft. Bowel sounds are normal. She exhibits no distension and no mass. There is no tenderness. There is no rebound and no guarding.  Lymphadenopathy:    She has no cervical adenopathy.  Neurological: She is alert and oriented to person, place, and time.  Skin:  There is erythema and swelling around the left eye, left cheek, and left ear lobe          Assessment & Plan:  She seems to have some contact dermatitis,so we will treat with a steroid shot today to be followed by a Medrol dose pack. Treat the UTI with Cipro.

## 2015-06-19 NOTE — Telephone Encounter (Signed)
She was seen today

## 2015-06-19 NOTE — Progress Notes (Signed)
   Subjective:    Patient ID: Bonnie Gonzalez, female    DOB: 1956/01/31, 59 y.o.   MRN: [id number]  HPI    Review of Systems     Objective:   Physical Exam        Assessment & Plan:  Pre visit review using our clinic review tool, if applicable. No additional management support is needed unless otherwise documented below in the visit note.

## 2015-07-02 ENCOUNTER — Telehealth: Payer: Self-pay | Admitting: *Deleted

## 2015-07-02 ENCOUNTER — Other Ambulatory Visit: Payer: Self-pay | Admitting: *Deleted

## 2015-07-02 MED ORDER — ESOMEPRAZOLE MAGNESIUM 40 MG PO CPDR: 40.0000 mg | DELAYED_RELEASE_CAPSULE | Freq: Every day | ORAL | Status: DC

## 2015-07-02 MED ORDER — LEVOTHYROXINE SODIUM 150 MCG PO TABS: 150.0000 ug | ORAL_TABLET | Freq: Every day | ORAL | Status: DC

## 2015-07-02 NOTE — Telephone Encounter (Signed)
Patient is requesting a refill of esomeprazole (NEXIUM) 40 MG capsule.  This was not prescribed by Dr Sarajane Jews [facility]

## 2015-07-02 NOTE — Telephone Encounter (Signed)
done

## 2015-07-02 NOTE — Telephone Encounter (Signed)
rx sent

## 2015-07-04 ENCOUNTER — Other Ambulatory Visit: Payer: Self-pay | Admitting: Family Medicine

## 2015-07-04 NOTE — Telephone Encounter (Signed)
Pt's rx levothyroxine (SYNTHROID, LEVOTHROID) 150 MCG tablet [facility] [place]  Pt needs sent to [facility]

## 2015-07-06 MED ORDER — LEVOTHYROXINE SODIUM 150 MCG PO TABS: 150.0000 ug | ORAL_TABLET | Freq: Every day | ORAL | Status: DC

## 2015-07-06 NOTE — Telephone Encounter (Signed)
Rx sent to pharmacy   

## 2015-07-17 ENCOUNTER — Telehealth: Payer: Self-pay | Admitting: Family Medicine

## 2015-07-17 NOTE — Telephone Encounter (Signed)
I agree we need a signed release form. I would simply give her a copy of her medication list (dated) and this should suffice

## 2015-07-17 NOTE — Telephone Encounter (Signed)
Pt fell and broke her ankle at work.  Drug screen came back w/ALPRAZolam Duanne Moron)  and temazepam (RESTORIL) 30 MG capsule Her employer needs confirmation you prescribe these meds for the pt. Advised pt she would need to come in and sign a release form.  Pt verbalized understanding. Pt will try and come by tomorrow

## 2015-07-18 NOTE — Telephone Encounter (Signed)
We have a signed consent form with pt's approval to discuss current medications below with [name]. I did speak with Elta Guadeloupe and verified that pt was prescribed both medications listed below by Dr. Sarajane Jews.

## 2015-07-23 ENCOUNTER — Encounter: Payer: Self-pay | Admitting: Family Medicine

## 2015-07-23 NOTE — Telephone Encounter (Signed)
Letter is ready.

## 2015-07-23 NOTE — Telephone Encounter (Signed)
Now pt needs another letter.   pt needs this letter addressed to [name] stating she can fulfill her job duties while taking these  2 meds listed below and any other med Dr Sarajane Jews prescribes for her.  Mr [name] had wanted this letter Friday afternoon. Pt did not know about this request, so [name] Is requesting this am asap. Emailed to:   [email or web address].  If it cannot be emailed, Mr [name] number is: [phone]

## 2015-07-24 NOTE — Telephone Encounter (Signed)
I faxed letter to below number.

## 2015-07-24 NOTE — Telephone Encounter (Signed)
Pt 's fax to send the letter is:   [phone]

## 2015-07-26 ENCOUNTER — Encounter: Payer: Self-pay | Admitting: Pulmonary Disease

## 2015-07-26 ENCOUNTER — Ambulatory Visit (INDEPENDENT_AMBULATORY_CARE_PROVIDER_SITE_OTHER): Payer: PRIVATE HEALTH INSURANCE | Admitting: Pulmonary Disease

## 2015-07-26 VITALS — BP 129/80 | HR 77 | Temp 98.3°F | Ht 60.0 in | Wt 235.0 lb

## 2015-07-26 DIAGNOSIS — G4733 Obstructive sleep apnea (adult) (pediatric): Secondary | ICD-10-CM

## 2015-07-26 HISTORY — DX: Obstructive sleep apnea (adult) (pediatric): G47.33

## 2015-07-26 NOTE — Patient Instructions (Signed)
You may have obstructive sleep apnea  Home sleep study Weight loss recommended Try to reduce sleep inducing medications - flexeril, vistaril,temazepam, xanax

## 2015-07-26 NOTE — Assessment & Plan Note (Signed)
You may have obstructive sleep apnea  Home sleep study Weight loss recommended Try to reduce sleep inducing medications - flexeril, vistaril,temazepam, xanax Have PCP check TSH for completion  Given excessive daytime somnolence, narrow pharyngeal exam, witnessed apneas & loud snoring, obstructive sleep apnea is very likely & an overnight polysomnogram will be scheduled as a home study. The pathophysiology of obstructive sleep apnea , it's cardiovascular consequences & modes of treatment including CPAP were discused with the patient in detail & they evidenced understanding.

## 2015-07-26 NOTE — Progress Notes (Signed)
Subjective:    Patient ID: Bonnie Gonzalez, female    DOB: [birth year], 59 y.o.   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 Loud snoring has been noted by family members. She reports frequent nocturnal awakenings, frequent urination. She report sleepiness while driving. She has occasionally woken herself up by her own snoring. Epworth sleepiness score is 18. She takes an x-ray anxiety and Zoloft for depression. She is an insulin requiring diabetic. She has been using temazepam about 2-3 times/week for the last 2 years. She just had a fall at work and fractured her right ankle and takes Flexeril for this but is trying to avoid oxycodone. She has been seen by neurology for memory loss issues Bedtime is around 11 PM, sleep latency is variable from 5 minutes to up to 2 hours, she sleeps on her right or on her back propped up with 3 pillows, reports 2-6 nocturnal awakenings for nocturia and is out of bed by 6 AM feeling tired with an occasional headache and dryness of mouth. She's gained 25 pounds over the last 5 years  There is no history suggestive of cataplexy, sleep paralysis or parasomnias   Past Medical History  Diagnosis Date  . Depression   . GERD (gastroesophageal reflux disease)   . Anxiety   . Allergy   . Diabetes mellitus   . Tuberculosis     hydrothyroidism  . Insomnia   . IBS (irritable bowel syndrome)     Past Surgical History  Procedure Laterality Date  . Benign tumor       removed from groin  . Wrist arthrocentesis N/A 2001    Allergies  Allergen Reactions  . Codeine Hives  . Fluoxetine Hcl Other (See Comments)    Hair itching  . Hydrocodone-Acetaminophen Hives  . Oxycodone-Acetaminophen Hives  . Penicillins Hives  . Tramadol Hcl Other (See Comments)    unknown   Social History   Social History  . Marital Status: Divorced    Spouse Name: N/A  . Number of Children: N/A  .  Years of Education: N/A   Occupational History  . Not on file.   Social History Main Topics  . Smoking status: Former Smoker -- 1.00 packs/day for 35 years    Types: Cigarettes    Quit date: 12/01/2008  . Smokeless tobacco: Never Used  . Alcohol Use: No  . Drug Use: No  . Sexual Activity: Not on file   Other Topics Concern  . Not on file   Social History Narrative   Lives alone.   She has four grown children.   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   Highest level of education:  1.5 years of college    Family History  Problem Relation Age of Onset  . Asthma      fhx  . Depression      fhx  . Diabetes      fhx  . Parkinsonism      fhx  . Lupus      fhx  . Alzheimer's disease Father     Deceased, 59  . Diabetes Mellitus II Father   . Arthritis/Rheumatoid Mother     Living, 28  . Diabetes Mellitus II Sister   . Diabetes Mellitus II Brother     Review of Systems  Constitutional: Negative for fever, chills and unexpected weight change.  HENT: Negative for congestion, dental problem, ear pain, nosebleeds,  postnasal drip, rhinorrhea, sinus pressure, sneezing, sore throat, trouble swallowing and voice change.   Eyes: Negative for visual disturbance.  Respiratory: Negative for cough, choking and shortness of breath.   Cardiovascular: Negative for chest pain and leg swelling.  Gastrointestinal: Negative for vomiting, abdominal pain and diarrhea.  Genitourinary: Negative for difficulty urinating.  Musculoskeletal: Negative for arthralgias.  Skin: Negative for rash.  Neurological: Negative for tremors, syncope and headaches.  Hematological: Does not bruise/bleed easily.       Objective:   Physical Exam  Gen. Pleasant, obese, in no distress, normal affect ENT - no lesions, no post nasal drip, class 2 airway Neck: No JVD, no thyromegaly, no carotid bruits Lungs: no use of accessory muscles, no dullness to percussion, decreased without  rales or rhonchi  Cardiovascular: Rhythm regular, heart sounds  normal, no murmurs or gallops, no peripheral edema Abdomen: soft and non-tender, no hepatosplenomegaly, BS normal. Musculoskeletal: Rt foot in a cast, no cyanosis or clubbing Neuro:  alert, non focal, no tremors       Assessment & Plan:

## 2015-08-09 ENCOUNTER — Other Ambulatory Visit: Payer: Self-pay | Admitting: Endocrinology

## 2015-08-09 NOTE — Telephone Encounter (Signed)
Please refill x 1 Ov is due  

## 2015-08-09 NOTE — Telephone Encounter (Signed)
Please advise if ok to refill. Last office visit was 11/10/2014.

## 2015-08-13 DIAGNOSIS — G4733 Obstructive sleep apnea (adult) (pediatric): Secondary | ICD-10-CM | POA: Diagnosis not present

## 2015-08-16 ENCOUNTER — Telehealth: Payer: Self-pay | Admitting: Pulmonary Disease

## 2015-08-16 DIAGNOSIS — G4733 Obstructive sleep apnea (adult) (pediatric): Secondary | ICD-10-CM

## 2015-08-16 NOTE — Telephone Encounter (Signed)
Mild OSA but O2 level stayed low Needs to obtain Split Study to clarify

## 2015-08-17 NOTE — Telephone Encounter (Signed)
Patient notified. Order entered. Nothing further needed.  

## 2015-08-21 DIAGNOSIS — G4733 Obstructive sleep apnea (adult) (pediatric): Secondary | ICD-10-CM | POA: Diagnosis not present

## 2015-08-22 ENCOUNTER — Encounter: Payer: Self-pay | Admitting: Pulmonary Disease

## 2015-08-23 ENCOUNTER — Other Ambulatory Visit: Payer: Self-pay | Admitting: *Deleted

## 2015-08-23 DIAGNOSIS — G4733 Obstructive sleep apnea (adult) (pediatric): Secondary | ICD-10-CM

## 2015-09-04 ENCOUNTER — Ambulatory Visit (INDEPENDENT_AMBULATORY_CARE_PROVIDER_SITE_OTHER): Payer: PRIVATE HEALTH INSURANCE | Admitting: Family Medicine

## 2015-09-04 ENCOUNTER — Encounter: Payer: Self-pay | Admitting: Family Medicine

## 2015-09-04 VITALS — BP 138/76 | HR 82 | Temp 98.4°F | Ht 60.0 in | Wt 234.0 lb

## 2015-09-04 DIAGNOSIS — R21 Rash and other nonspecific skin eruption: Secondary | ICD-10-CM | POA: Diagnosis not present

## 2015-09-04 MED ORDER — METHYLPREDNISOLONE 4 MG PO TBPK: ORAL_TABLET | ORAL | Status: DC

## 2015-09-04 MED ORDER — METHYLPREDNISOLONE ACETATE 80 MG/ML IJ SUSP
120.0000 mg | Freq: Once | INTRAMUSCULAR | Status: AC
  Administered 2015-09-04: 120 mg via INTRAMUSCULAR

## 2015-09-04 NOTE — Addendum Note (Signed)
Addended by: Aggie Hacker A on: 09/04/2015 12:13 PM   Modules accepted: Orders

## 2015-09-04 NOTE — Progress Notes (Signed)
Pre visit review using our clinic review tool, if applicable. No additional management support is needed unless otherwise documented below in the visit note. 

## 2015-09-04 NOTE — Progress Notes (Signed)
   Subjective:    Patient ID: Bonnie Gonzalez, female    DOB: 09-28-1956, 59 y.o.   MRN: [id number]  HPI Here for another flare of redness, mild swelling, and itching of the left side of her face. This happened in July and she responded well to a course of prednisone. Now it has come back the past 2 days. No vision problems, she feel swell in general. No new medications, soaps, etc. She does not take any ACE inhibitors.    Review of Systems  Constitutional: Negative.   Eyes: Negative.   Respiratory: Negative.   Cardiovascular: Negative.   Skin: Positive for rash.  Neurological: Negative.        Objective:   Physical Exam  Constitutional: She appears well-developed and well-nourished. No distress.  HENT:  Right Ear: External ear normal.  Left Ear: External ear normal.  Nose: Nose normal.  Mouth/Throat: Oropharynx is clear and moist.  Eyes: Conjunctivae and EOM are normal. Pupils are equal, round, and reactive to light. No scleral icterus.  Neck: Neck supple. No thyromegaly present.  Cardiovascular: Normal rate, regular rhythm, normal heart sounds and intact distal pulses.   Pulmonary/Chest: Effort normal and breath sounds normal.  Lymphadenopathy:    She has no cervical adenopathy.  Skin:  Slight erythema to the left side of the forehead, face, and neck. Not warm or swollen           Assessment & Plan:  Facial rash of uncertain etiology. This resembles angioedema in some ways but it is unusual to be limited to one side of the face. We will treat her again with a steroid shot and an oral taper. Refer to Dermatology

## 2015-09-25 ENCOUNTER — Ambulatory Visit: Payer: PRIVATE HEALTH INSURANCE | Admitting: Family Medicine

## 2015-10-15 ENCOUNTER — Other Ambulatory Visit: Payer: Self-pay | Admitting: Family Medicine

## 2015-10-16 ENCOUNTER — Other Ambulatory Visit: Payer: Self-pay | Admitting: Family Medicine

## 2015-10-18 ENCOUNTER — Ambulatory Visit (INDEPENDENT_AMBULATORY_CARE_PROVIDER_SITE_OTHER): Payer: PRIVATE HEALTH INSURANCE | Admitting: Endocrinology

## 2015-10-18 ENCOUNTER — Encounter: Payer: Self-pay | Admitting: Endocrinology

## 2015-10-18 VITALS — BP 148/88 | HR 81 | Temp 98.6°F | Ht 60.0 in | Wt 236.0 lb

## 2015-10-18 DIAGNOSIS — E119 Type 2 diabetes mellitus without complications: Secondary | ICD-10-CM | POA: Diagnosis not present

## 2015-10-18 DIAGNOSIS — M545 Low back pain, unspecified: Secondary | ICD-10-CM

## 2015-10-18 DIAGNOSIS — M549 Dorsalgia, unspecified: Secondary | ICD-10-CM

## 2015-10-18 DIAGNOSIS — Z0189 Encounter for other specified special examinations: Secondary | ICD-10-CM

## 2015-10-18 DIAGNOSIS — Z794 Long term (current) use of insulin: Secondary | ICD-10-CM | POA: Diagnosis not present

## 2015-10-18 DIAGNOSIS — Z0000 Encounter for general adult medical examination without abnormal findings: Secondary | ICD-10-CM

## 2015-10-18 HISTORY — DX: Dorsalgia, unspecified: M54.9

## 2015-10-18 LAB — URINALYSIS, ROUTINE W REFLEX MICROSCOPIC
Bilirubin Urine: NEGATIVE
HGB URINE DIPSTICK: NEGATIVE
Ketones, ur: NEGATIVE
Nitrite: NEGATIVE
RBC / HPF: NONE SEEN (ref 0–?)
SPECIFIC GRAVITY, URINE: 1.01 (ref 1.000–1.030)
Total Protein, Urine: NEGATIVE
URINE GLUCOSE: NEGATIVE
UROBILINOGEN UA: 0.2 (ref 0.0–1.0)
pH: 7 (ref 5.0–8.0)

## 2015-10-18 LAB — BASIC METABOLIC PANEL
BUN: 13 mg/dL (ref 6–23)
CALCIUM: 10 mg/dL (ref 8.4–10.5)
CO2: 31 mEq/L (ref 19–32)
Chloride: 98 mEq/L (ref 96–112)
Creatinine, Ser: 0.62 mg/dL (ref 0.40–1.20)
GFR: 104.55 mL/min (ref >60.00)
Glucose, Bld: 132 mg/dL — ABNORMAL HIGH (ref 70–99)
POTASSIUM: 4 meq/L (ref 3.5–5.1)
SODIUM: 137 meq/L (ref 135–145)

## 2015-10-18 LAB — HEPATIC FUNCTION PANEL
ALBUMIN: 4.2 g/dL (ref 3.5–5.2)
ALK PHOS: 109 U/L (ref 39–117)
ALT: 36 U/L — AB (ref 0–35)
AST: 28 U/L (ref 0–37)
BILIRUBIN DIRECT: 0.1 mg/dL (ref 0.0–0.3)
TOTAL PROTEIN: 8.1 g/dL (ref 6.0–8.3)
Total Bilirubin: 0.4 mg/dL (ref 0.2–1.2)

## 2015-10-18 LAB — CBC WITH DIFFERENTIAL/PLATELET
BASOS ABS: 0.1 10*3/uL (ref 0.0–0.1)
Basophils Relative: 0.4 % (ref 0.0–3.0)
Eosinophils Absolute: 0.2 10*3/uL (ref 0.0–0.7)
Eosinophils Relative: 1.6 % (ref 0.0–5.0)
HCT: 42.9 % (ref 36.0–46.0)
Hemoglobin: 13.8 g/dL (ref 12.0–15.0)
LYMPHS ABS: 3.3 10*3/uL (ref 0.7–4.0)
Lymphocytes Relative: 22.8 % (ref 12.0–46.0)
MCHC: 32.1 g/dL (ref 30.0–36.0)
MCV: 86.1 fl (ref 78.0–100.0)
MONO ABS: 0.6 10*3/uL (ref 0.1–1.0)
MONOS PCT: 4.1 % (ref 3.0–12.0)
NEUTROS ABS: 10.2 10*3/uL — AB (ref 1.4–7.7)
NEUTROS PCT: 71.1 % (ref 43.0–77.0)
PLATELETS: 256 10*3/uL (ref 150.0–400.0)
RBC: 4.99 Mil/uL (ref 3.87–5.11)
RDW: 17 % — ABNORMAL HIGH (ref 11.5–15.5)
WBC: 14.4 10*3/uL — ABNORMAL HIGH (ref 4.0–10.5)

## 2015-10-18 LAB — LIPID PANEL
CHOLESTEROL: 173 mg/dL (ref 0–200)
HDL: 41.1 mg/dL (ref >39.00)
LDL Cholesterol: 107 mg/dL — ABNORMAL HIGH (ref 0–99)
NonHDL: 132.08
TRIGLYCERIDES: 127 mg/dL (ref 0.0–149.0)
Total CHOL/HDL Ratio: 4
VLDL: 25.4 mg/dL (ref 0.0–40.0)

## 2015-10-18 LAB — MICROALBUMIN / CREATININE URINE RATIO
Creatinine,U: 77.2 mg/dL
MICROALB/CREAT RATIO: 0.9 mg/g (ref 0.0–30.0)

## 2015-10-18 LAB — TSH: TSH: 3.49 u[IU]/mL (ref 0.35–4.50)

## 2015-10-18 LAB — POCT GLYCOSYLATED HEMOGLOBIN (HGB A1C): HEMOGLOBIN A1C: 8.7

## 2015-10-18 MED ORDER — INSULIN GLARGINE 100 UNIT/ML SOLOSTAR PEN: 260.0000 [IU] | PEN_INJECTOR | Freq: Every day | SUBCUTANEOUS | Status: DC

## 2015-10-18 NOTE — Patient Instructions (Addendum)
check your blood sugar twice a day.  vary the time of day when you check, between before the 3 meals, and at bedtime.  also check if you have symptoms of your blood sugar being too high or too low.  please keep a record of the readings and bring it to your next appointment here.  please call us sooner if your blood sugar goes below 70, or if it stays over 200.  The bedtime is a really important time of day to check, as this is after your evening shot.   Please increase the lantus to 260 units each morning. Blood and urine tests are requested for you today.  We'll let you know about the results. Please come back for a follow-up appointment in 3 months.   On this type of insulin schedule, you should eat meals on a regular schedule.  If a meal is missed or significantly delayed, your blood sugar could go low.

## 2015-10-18 NOTE — Progress Notes (Signed)
Subjective:    Patient ID: Bonnie Gonzalez, female    DOB: [birth year], 59 y.o.   MRN: [id number]  HPI Pt returns for f/u of diabetes mellitus: DM type: Insulin-requiring type 2 Dx'ed: [id number] Complications: none.  Therapy: insulin since 2012 GDM: never DKA: never Severe hypoglycemia: never. Pancreatitis: never Other: she chose a simple insulin regimen; she cannot afford weight-loss surgery. Interval history: She takes insulins as rx'ed.  She seldom has hypoglycemia, and these episodes are mild.  pt states she feels well in general.  she brings a record of her cbg's which i have reviewed today.  It varies from 100-300.  There is no trend throughout the day, but she checks only fasting and before supper.  She is about to lose her health insurance. Past Medical History  Diagnosis Date  . Depression   . GERD (gastroesophageal reflux disease)   . Anxiety   . Allergy   . Diabetes mellitus   . Tuberculosis     hydrothyroidism  . Insomnia   . IBS (irritable bowel syndrome)     Past Surgical History  Procedure Laterality Date  . Benign tumor       removed from groin  . Wrist arthrocentesis N/A 2001    Social History   Social History  . Marital Status: Divorced    Spouse Name: N/A  . Number of Children: N/A  . Years of Education: N/A   Occupational History  . Not on file.   Social History Main Topics  . Smoking status: Former Smoker -- 1.00 packs/day for 35 years    Types: Cigarettes    Quit date: 12/01/2008  . Smokeless tobacco: Never Used  . Alcohol Use: No  . Drug Use: No  . Sexual Activity: Not on file   Other Topics Concern  . Not on file   Social History Narrative   Lives alone.   She has four grown children.   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   Highest level of education:  1.5 years of college    Current Outpatient Prescriptions on File Prior to Visit  Medication Sig Dispense Refill  . albuterol (PROVENTIL HFA;VENTOLIN  HFA) 108 (90 BASE) MCG/ACT inhaler Inhale 2 puffs into the lungs every 4 (four) hours as needed for wheezing or shortness of breath. 1 Inhaler 5  . alendronate (FOSAMAX) 70 MG tablet TAKE 1 TABLET EVERY 7 DAYS WITH A FULL GLASS OF WATER ON AN EMPTY STOMACH 12 tablet 3  . ALPRAZolam (XANAX) 0.5 MG tablet Take 1 tablet (0.5 mg total) by mouth 2 (two) times daily as needed for anxiety. 180 tablet 1  . atorvastatin (LIPITOR) 40 MG tablet Take 1 tablet (40 mg total) by mouth daily. 90 tablet 3  . cyclobenzaprine (FLEXERIL) 10 MG tablet Take 10 mg by mouth 3 (three) times daily as needed for muscle spasms.    Marland Kitchen esomeprazole (NEXIUM) 40 MG capsule Take 1 capsule (40 mg total) by mouth daily at 12 noon. 90 capsule 3  . halobetasol (ULTRAVATE) 0.05 % cream Apply topically 2 (two) times daily. 50 g 3  . hydrOXYzine (VISTARIL) 25 MG capsule TAKE 1 CAPSULE 3 TIMES     DAILY AS NEEDED 180 capsule 2  . ibuprofen (ADVIL,MOTRIN) 600 MG tablet Take 600 mg by mouth every 6 (six) hours as needed (pain).     . insulin aspart (NOVOLOG) 100 UNIT/ML FlexPen Inject 100 Units into the skin daily with supper.     Marland Kitchen  ketoconazole (NIZORAL) 2 % cream Apply 1 application topically 2 (two) times daily. 60 g 5  . levothyroxine (SYNTHROID, LEVOTHROID) 150 MCG tablet Take 1 tablet (150 mcg total) by mouth daily. 90 tablet 1  . meclizine (ANTIVERT) 50 MG tablet Take 0.5 tablets (25 mg total) by mouth 3 (three) times daily as needed. 30 tablet 0  . methylPREDNISolone (MEDROL DOSEPAK) 4 MG TBPK tablet As directed 21 tablet 0  . NOVOLOG FLEXPEN 100 UNIT/ML FlexPen INJECT 90 UNITS INTO THE   SKIN DAILY WITH SUPPER 90 mL 1  . sertraline (ZOLOFT) 100 MG tablet Take 1 tablet (100 mg total) by mouth daily. 90 tablet 3  . terbinafine (LAMISIL) 250 MG tablet Take 1 tablet (250 mg total) by mouth daily. 30 tablet 2  . temazepam (RESTORIL) 30 MG capsule TAKE ONE CAPSULE BY MOUTH EVERY NIGHT AT BEDTIME AS NEEDED 30 capsule 5   No current  facility-administered medications on file prior to visit.    Allergies  Allergen Reactions  . Codeine Hives  . Fluoxetine Hcl Other (See Comments)    Hair itching  . Hydrocodone-Acetaminophen Hives  . Oxycodone-Acetaminophen Hives  . Penicillins Hives  . Tramadol Hcl Other (See Comments)    unknown    Family History  Problem Relation Age of Onset  . Asthma      fhx  . Depression      fhx  . Diabetes      fhx  . Parkinsonism      fhx  . Lupus      fhx  . Alzheimer's disease Father     Deceased, 5  . Diabetes Mellitus II Father   . Arthritis/Rheumatoid Mother     Living, 13  . Diabetes Mellitus II Sister   . Diabetes Mellitus II Brother     BP 148/88 mmHg  Pulse 81  Temp(Src) 98.6 F (37 C) (Oral)  Ht 5' (1.524 m)  Wt 236 lb (107.049 kg)  BMI 46.09 kg/m2  SpO2 92%     Review of Systems Denies LOC.  She has slight low-back pain (no injury).    Objective:   Physical Exam VITAL SIGNS:  See vs page GENERAL: no distress Pulses: dorsalis pedis intact bilat.   MSK: no deformity of the feet CV: no leg edema Skin:  no ulcer on the feet.  normal color and temp on the feet. Neuro: sensation is intact to touch on the feet.     A1c=8.7%  Radiol: L-spine: OA  Lab Results  Component Value Date   WBC 14.4* 10/18/2015   HGB 13.8 10/18/2015   HCT 42.9 10/18/2015   PLT 256.0 10/18/2015   GLUCOSE 132* 10/18/2015   CHOL 173 10/18/2015   TRIG 127.0 10/18/2015   HDL 41.10 10/18/2015   LDLDIRECT 144.0 12/11/2011   LDLCALC 107* 10/18/2015   ALT 36* 10/18/2015   AST 28 10/18/2015   NA 137 10/18/2015   K 4.0 10/18/2015   CL 98 10/18/2015   CREATININE 0.62 10/18/2015   BUN 13 10/18/2015   CO2 31 10/18/2015   TSH 3.49 10/18/2015   HGBA1C 8.7 10/18/2015   MICROALBUR <0.7 10/18/2015      Assessment & Plan:  DM: she needs increased rx Dyslipidemia: take lipitor as rx'ed, and recheck in the future Leukocytosis: uncertain etiology.  I advised pt to  follow NASH: improved: she should follow.   Low-back pain: due to OA: no rx is needed now   Patient is advised the following: Patient Instructions  check your blood sugar twice a day.  vary the time of day when you check, between before the 3 meals, and at bedtime.  also check if you have symptoms of your blood sugar being too high or too low.  please keep a record of the readings and bring it to your next appointment here.  please call us sooner if your blood sugar goes below 70, or if it stays over 200.  The bedtime is a really important time of day to check, as this is after your evening shot.   Please increase the lantus to 260 units each morning. Blood and urine tests are requested for you today.  We'll let you know about the results. Please come back for a follow-up appointment in 3 months.   On this type of insulin schedule, you should eat meals on a regular schedule.  If a meal is missed or significantly delayed, your blood sugar could go low.

## 2015-10-18 NOTE — Telephone Encounter (Signed)
refill for 6 months  

## 2015-10-19 ENCOUNTER — Other Ambulatory Visit: Payer: Self-pay | Admitting: Family Medicine

## 2015-10-19 ENCOUNTER — Telehealth: Payer: Self-pay | Admitting: Endocrinology

## 2015-10-19 NOTE — Telephone Encounter (Signed)
Pt needs refill on temazepam 30 mg#30 send to [facility]

## 2015-10-19 NOTE — Telephone Encounter (Signed)
I contacted the pt and relayed my chart message from 10/18/2015.

## 2015-10-19 NOTE — Telephone Encounter (Signed)
Call in #30 with 5 rf 

## 2015-10-19 NOTE — Telephone Encounter (Signed)
Patient is calling for lab results.

## 2015-10-22 MED ORDER — ALPRAZOLAM 0.5 MG PO TABS: 0.5000 mg | ORAL_TABLET | Freq: Two times a day (BID) | ORAL | Status: DC | PRN

## 2015-10-22 NOTE — Telephone Encounter (Signed)
Received call back from [facility] with [facility], they did receive Rx refill for Temazepam on Friday and are waiting on PA and did cancel Rx for Alprazolam.

## 2015-10-22 NOTE — Telephone Encounter (Signed)
[place] and left message for refill for Alprazolam and then realized called wrong Rx in and then called back and left message for them to cancel Rx for Alprazolam that I was checking if they got Rx for Temazepam that was called in on Friday 11/18 to call me back and let me know.  Called pt and asked her if she got her Rx for Temazepam? Pt said no it needs a PA and she needs it done as soon as possible due to losing insurance. Pt said she left a message for person doing PA saying she needed it right away and asked if I could check on it. Told her I would check on it to see if being done. PT verbalized understanding.  Called pt back told her spoke to [place] and she said she did receive paperwork for PA and will do today. Pt verbalized understanding.

## 2015-10-24 ENCOUNTER — Telehealth: Payer: Self-pay | Admitting: Endocrinology

## 2015-10-24 NOTE — Telephone Encounter (Signed)
Normal, please call results

## 2015-10-24 NOTE — Telephone Encounter (Signed)
Noted, patient is aware. 

## 2015-10-24 NOTE — Telephone Encounter (Signed)
Pt calling for urinalysis results can you please do in Dr. Cordelia Pen absence so Suanne Marker can call pt back

## 2015-11-05 ENCOUNTER — Ambulatory Visit (HOSPITAL_BASED_OUTPATIENT_CLINIC_OR_DEPARTMENT_OTHER): Payer: PRIVATE HEALTH INSURANCE

## 2015-12-13 ENCOUNTER — Telehealth: Payer: Self-pay | Admitting: Family Medicine

## 2015-12-13 ENCOUNTER — Other Ambulatory Visit: Payer: Self-pay | Admitting: Endocrinology

## 2015-12-13 MED ORDER — DESLORATADINE 5 MG PO TABS: 5.0000 mg | ORAL_TABLET | Freq: Every day | ORAL | Status: DC

## 2015-12-13 MED ORDER — LEVOTHYROXINE SODIUM 150 MCG PO TABS: 150.0000 ug | ORAL_TABLET | Freq: Every day | ORAL | Status: DC

## 2015-12-13 MED ORDER — INSULIN GLARGINE 300 UNIT/ML ~~LOC~~ SOPN: 260.0000 [IU] | PEN_INJECTOR | Freq: Every day | SUBCUTANEOUS | Status: DC

## 2015-12-13 MED ORDER — DEXLANSOPRAZOLE 60 MG PO CPDR: 60.0000 mg | DELAYED_RELEASE_CAPSULE | Freq: Every day | ORAL | Status: DC

## 2015-12-13 MED ORDER — INSULIN ASPART 100 UNIT/ML FLEXPEN: 100.0000 [IU] | PEN_INJECTOR | Freq: Every day | SUBCUTANEOUS | Status: DC

## 2015-12-13 NOTE — Telephone Encounter (Signed)
rx were printed for her MAP

## 2015-12-14 ENCOUNTER — Telehealth: Payer: Self-pay | Admitting: Endocrinology

## 2015-12-14 ENCOUNTER — Other Ambulatory Visit: Payer: Self-pay

## 2015-12-14 MED ORDER — "INSULIN SYRINGE 31G X 5/16"" 1 ML MISC": Status: DC

## 2015-12-14 NOTE — Telephone Encounter (Signed)
Please verify the dosage. Thanks!

## 2015-12-14 NOTE — Addendum Note (Signed)
Addended by: Verlin Grills T on: 12/14/2015 04:20 PM   Modules accepted: Orders

## 2015-12-14 NOTE — Telephone Encounter (Addendum)
Pt advised of note below and voiced understanding.  

## 2015-12-14 NOTE — Telephone Encounter (Signed)
See note below and please advise what the pt should take until her pt assistance comes in. Thanks!

## 2015-12-14 NOTE — Telephone Encounter (Signed)
Toujeo is not available thru the pt assistance for another 4-6 weeks. She needs help with this please

## 2015-12-14 NOTE — Telephone Encounter (Signed)
Correction: should be 85 units tid.

## 2015-12-14 NOTE — Telephone Encounter (Signed)
Buy NPH from [facility], and take 120 units tid

## 2015-12-24 ENCOUNTER — Telehealth: Payer: Self-pay | Admitting: Endocrinology

## 2015-12-24 NOTE — Telephone Encounter (Signed)
Since the switch to a different insulin today is the 1st day that the BS level has been under 200 it is 192, please advise  She has had 3 evening readings of over 500

## 2015-12-24 NOTE — Telephone Encounter (Signed)
Please verify NPH insulin is 85 units tid. Then increase to 100 units tid Please move up next appt to this week

## 2015-12-24 NOTE — Telephone Encounter (Signed)
See note below and please advise, Thanks! 

## 2015-12-24 NOTE — Telephone Encounter (Signed)
I contacted the pt. Pt had been taking 85 units and will increase to 100 units. Pt stated she could not schedule an appointment at this time because she does not have insurance. Pt will call on Friday to report blood sugar readings.

## 2015-12-27 ENCOUNTER — Telehealth: Payer: Self-pay

## 2015-12-27 MED ORDER — INSULIN LISPRO 100 UNIT/ML (KWIKPEN): PEN_INJECTOR | SUBCUTANEOUS | Status: DC

## 2015-12-27 NOTE — Telephone Encounter (Signed)
Either is ok with me--just let me know

## 2015-12-27 NOTE — Telephone Encounter (Signed)
Crystal with patient assistance for novolog called. She stated the pt would not be able to receive novolog pens because the patient assistance does not receive novolog flex pens anymore. Pt did receive approval for Toujeo and is waiting for the supply to come in. Should the novolog be changed to the vials or do we need to send a Rx for the Humalog pens. Patient prefers the pen.  Please advise, Thanks!

## 2015-12-27 NOTE — Telephone Encounter (Signed)
Pt would like for the Humalog to be sent to pen form. Rx sent to pt assistance.

## 2016-01-14 ENCOUNTER — Telehealth: Payer: Self-pay | Admitting: Family Medicine

## 2016-01-14 NOTE — Telephone Encounter (Signed)
[facility] [phone]  Requesting new script for the following meds:  atorvastatin (LIPITOR) 40 MG tablet sertraline (ZOLOFT) 100 MG tablet ( per Pharmacy, they only stock 50mg )

## 2016-01-15 MED ORDER — ATORVASTATIN CALCIUM 40 MG PO TABS: 40.0000 mg | ORAL_TABLET | Freq: Every day | ORAL | Status: DC

## 2016-01-15 MED ORDER — SERTRALINE HCL 50 MG PO TABS: 100.0000 mg | ORAL_TABLET | Freq: Every day | ORAL | Status: DC

## 2016-01-15 NOTE — Telephone Encounter (Signed)
Scripts were printed and faxed to below number and I spoke with pt.

## 2016-01-15 NOTE — Telephone Encounter (Signed)
Refill both for one year (change the Zoloft to 50 mg tabs to take 2 tabs daily)

## 2016-01-17 ENCOUNTER — Telehealth: Payer: Self-pay

## 2016-01-17 NOTE — Telephone Encounter (Signed)
Ov due.  Let's address then 

## 2016-01-17 NOTE — Telephone Encounter (Signed)
i understand your situation. However, prescribing in this situation does not meed patient safety standards.

## 2016-01-17 NOTE — Telephone Encounter (Signed)
Pt advised of note below. Pt does not want to continue the NPH at this time because she has her pt assistance. Pt was upset about this and does not know how to proceed. She is afraid she will hurt herself without knowing the dosage.

## 2016-01-17 NOTE — Telephone Encounter (Signed)
the best I can offer is to advise taking [facility] instead of novolog, and: NPH from [facility] instead of toujeo.

## 2016-01-17 NOTE — Telephone Encounter (Signed)
Pt is not sure what she should at this point with her insulin dosage. Pt stated she would not be able to afford the co-payment and does not know what other options she has at this time. Please advise, Thanks!

## 2016-01-17 NOTE — Telephone Encounter (Signed)
Pt called and stated she received the toujeo pt assistance program. Pt wanted to know if the toujeo should be the same dosage as the lantus she was taking and what her Humalog dosage should be? Pt has been taking 100 units tid of NPH insulin from [facility] until her pt assistance medication came in. Please advise, Thanks!

## 2016-01-17 NOTE — Telephone Encounter (Signed)
Patient stated since she is still unemployed she cannot afford to come in for a office visit.

## 2016-01-17 NOTE — Telephone Encounter (Signed)
Dosage of NPH is similar to toujeo

## 2016-01-18 ENCOUNTER — Ambulatory Visit: Payer: PRIVATE HEALTH INSURANCE | Admitting: Endocrinology

## 2016-01-18 NOTE — Telephone Encounter (Signed)
Pt advised of note below and voiced understanding. Pt will schedule a follow up as soon as she has the funds to do so.

## 2016-04-07 ENCOUNTER — Telehealth: Payer: Self-pay | Admitting: Family Medicine

## 2016-04-07 NOTE — Telephone Encounter (Signed)
Pt states she has had an upper resporitory infection for 3 weeks. Pt still has green thick mucus, and a nasty taste. Pt has been on musinex and claritan (daily).  Pt does not have any insurance and hopes dr Sarajane Jews will send an antibiotic to:  [facility]/[address]

## 2016-04-07 NOTE — Telephone Encounter (Signed)
Can you call pt to get a phone number order fax number? I do not show a pharmacy with this spelling?

## 2016-04-07 NOTE — Telephone Encounter (Signed)
Call in a Zpack  ?

## 2016-04-07 NOTE — Telephone Encounter (Signed)
Telephone Fax    [phone]    Pharmacy Address and Hours    Address Hours   [address] [place] [address] None Available     [facility]

## 2016-04-08 MED ORDER — AZITHROMYCIN 250 MG PO TABS: ORAL_TABLET | ORAL | Status: DC

## 2016-04-08 NOTE — Telephone Encounter (Signed)
I called in script and spoke with pt. 

## 2016-05-28 ENCOUNTER — Telehealth: Payer: Self-pay | Admitting: Family Medicine

## 2016-05-28 NOTE — Telephone Encounter (Signed)
Can we refill this? 

## 2016-05-28 NOTE — Telephone Encounter (Signed)
Refill request for Hydroxyzine 25 mg and send to [facility].

## 2016-05-29 NOTE — Telephone Encounter (Signed)
Call in #180 with 3 rf 

## 2016-05-30 MED ORDER — HYDROXYZINE PAMOATE 25 MG PO CAPS: ORAL_CAPSULE | ORAL | Status: DC

## 2016-05-30 NOTE — Telephone Encounter (Signed)
Refill sent to pharmacy.   

## 2016-06-10 ENCOUNTER — Telehealth: Payer: Self-pay | Admitting: Family Medicine

## 2016-06-10 NOTE — Telephone Encounter (Addendum)
Pt is having back spasm again and would like new rx flexeril send to [facility] department attn  dawn please call [phone]. Pt has some additional questi0ns for sylvia pt did not elaborate

## 2016-06-11 NOTE — Telephone Encounter (Signed)
I tried to reach pt and no answer. I will send pt a my chart message.

## 2016-06-13 MED ORDER — CYCLOBENZAPRINE HCL 10 MG PO TABS: 10.0000 mg | ORAL_TABLET | Freq: Three times a day (TID) | ORAL | Status: DC | PRN

## 2016-06-13 NOTE — Telephone Encounter (Signed)
I called in below script and will forward this note to Dr. Sarajane Jews for further refills.

## 2016-06-13 NOTE — Addendum Note (Signed)
Addended by: Aggie Hacker A on: 06/13/2016 01:38 PM   Modules accepted: Orders

## 2016-06-13 NOTE — Telephone Encounter (Signed)
I spoke pt and she just wanted to know if we can refill the Flexeril, looks like she hasn't taken in awhile?

## 2016-06-13 NOTE — Telephone Encounter (Signed)
Ok for 10 pills, TID as needed for muscle spasm. Follow up with Dr. Sarajane Jews if symptoms continue

## 2016-06-16 MED ORDER — CYCLOBENZAPRINE HCL 10 MG PO TABS: 10.0000 mg | ORAL_TABLET | Freq: Three times a day (TID) | ORAL | Status: DC | PRN

## 2016-06-16 NOTE — Addendum Note (Signed)
Addended by: Aggie Hacker A on: 06/16/2016 01:19 PM   Modules accepted: Orders

## 2016-06-16 NOTE — Telephone Encounter (Signed)
Call in [place] #90 with 2 rf

## 2016-06-16 NOTE — Telephone Encounter (Signed)
Script was printed and faxed. 

## 2016-06-23 ENCOUNTER — Encounter: Payer: Self-pay | Admitting: Family Medicine

## 2016-06-23 ENCOUNTER — Ambulatory Visit (INDEPENDENT_AMBULATORY_CARE_PROVIDER_SITE_OTHER): Payer: PRIVATE HEALTH INSURANCE | Admitting: Family Medicine

## 2016-06-23 VITALS — BP 150/62 | Temp 98.3°F | Ht 60.0 in | Wt 241.0 lb

## 2016-06-23 DIAGNOSIS — F411 Generalized anxiety disorder: Secondary | ICD-10-CM

## 2016-06-23 DIAGNOSIS — M545 Low back pain, unspecified: Secondary | ICD-10-CM

## 2016-06-23 MED ORDER — ALPRAZOLAM 0.5 MG PO TABS: 0.5000 mg | ORAL_TABLET | Freq: Two times a day (BID) | ORAL | 5 refills | Status: DC | PRN

## 2016-06-23 NOTE — Progress Notes (Signed)
Pre visit review using our clinic review tool, if applicable. No additional management support is needed unless otherwise documented below in the visit note. 

## 2016-06-23 NOTE — Progress Notes (Signed)
   Subjective:    Patient ID: Bonnie Gonzalez, female    DOB: 03-03-1956, 60 y.o.   MRN: [id number]  HPI    Review of Systems     Objective:   Physical Exam        Assessment & Plan:

## 2016-06-23 NOTE — Progress Notes (Signed)
   Subjective:    Patient ID: Bonnie Gonzalez, female    DOB: [birth year], 60 y.o.   MRN: [id number]  HPI Here to discuss applying for disability. She has been struggling with severe chronic low back pain and she has been seeing Arron Jimmye Norman DC for care of this. She has been getting manipulations and she wears a TENS unit every day. She has been applying for desk jobs since she cannot stand, and nothing has been available to her. Even pro;longed sitting is difficult for her due to the pain. She is thinking about applying for disability with DSS.    Review of Systems  Constitutional: Negative.   Musculoskeletal: Positive for arthralgias, back pain and gait problem.       Objective:   Physical Exam  Constitutional: She is oriented to person, place, and time.  In obvious pain, walks with a cane.   Cardiovascular: Normal rate, regular rhythm, normal heart sounds and intact distal pulses.   Pulmonary/Chest: Effort normal and breath sounds normal.  Neurological: She is alert and oriented to person, place, and time.          Assessment & Plan:  She has chronic severe low back pain and I do think she is totally disabled. She will apply to DSS for disability and we will supply any documentation needed to support this. Her anxiety is stable.  Laurey Morale, MD

## 2016-07-01 ENCOUNTER — Encounter: Payer: Self-pay | Admitting: Family Medicine

## 2016-07-01 NOTE — Telephone Encounter (Signed)
I really don't know who might accept this card. My advice to her is to contact the people who make the orange cards because I am sure they have a central list of who participates with this program. When she finds an Endocrinologist, we can then do a referral

## 2016-07-23 ENCOUNTER — Ambulatory Visit (INDEPENDENT_AMBULATORY_CARE_PROVIDER_SITE_OTHER): Payer: PRIVATE HEALTH INSURANCE | Admitting: Family Medicine

## 2016-07-23 ENCOUNTER — Encounter: Payer: Self-pay | Admitting: Family Medicine

## 2016-07-23 VITALS — BP 146/78 | HR 83 | Temp 98.7°F | Ht 60.0 in | Wt 240.0 lb

## 2016-07-23 DIAGNOSIS — Z794 Long term (current) use of insulin: Secondary | ICD-10-CM

## 2016-07-23 DIAGNOSIS — E119 Type 2 diabetes mellitus without complications: Secondary | ICD-10-CM

## 2016-07-23 DIAGNOSIS — R109 Unspecified abdominal pain: Secondary | ICD-10-CM

## 2016-07-23 LAB — POC URINALSYSI DIPSTICK (AUTOMATED)
Bilirubin, UA: NEGATIVE
Blood, UA: NEGATIVE
Ketones, UA: NEGATIVE
LEUKOCYTES UA: NEGATIVE
NITRITE UA: NEGATIVE
PROTEIN UA: NEGATIVE
SPEC GRAV UA: 1.02
UROBILINOGEN UA: 0.2
pH, UA: 6

## 2016-07-23 NOTE — Addendum Note (Signed)
Addended by: [name] A on: 07/23/2016 06:09 PM   Modules accepted: Orders

## 2016-07-23 NOTE — Progress Notes (Signed)
   Subjective:    Patient ID: Bonnie Gonzalez, female    DOB: [birth year], 60 y.o.   MRN: [id number]  HPI Here for 10 days of intermittent sharp pains in the right flank. Sometimes they radiate around to the middle back and sometimes down to the RLQ. The pains are worse when she takes a deep breath but not when she stoops over. She has nausea at times with the pain but has not vomited. No fever. She has been mildly constipated for a few weeks and she has taken Correctol a few times. She averages a small BM every other day. No UTI symptoms. Appetite is okay. Eating food does not affect the pain one way or the other.    Review of Systems  Constitutional: Negative.   Respiratory: Negative.   Cardiovascular: Negative.   Gastrointestinal: Positive for abdominal pain, constipation and nausea. Negative for abdominal distention, blood in stool, diarrhea, rectal pain and vomiting.  Genitourinary: Negative.        Objective:   Physical Exam  Constitutional: She appears well-developed and well-nourished. No distress.  Cardiovascular: Normal rate, regular rhythm, normal heart sounds and intact distal pulses.   Pulmonary/Chest: Effort normal and breath sounds normal. No respiratory distress. She has no wheezes. She has no rales.  Abdominal: Soft. Bowel sounds are normal. She exhibits no distension and no mass. There is no rebound and no guarding.  Moderately tender in the RUQ   Skin: No rash noted.          Assessment & Plan:  Right flank pain, likely due to either constipation or gall bladder disease. We will set up an abdominal US soon. She will drink fluids and avoid fatty foods. Start using Miralax daily.  Laurey Morale, MD

## 2016-07-23 NOTE — Progress Notes (Signed)
Pre visit review using our clinic review tool, if applicable. No additional management support is needed unless otherwise documented below in the visit note. 

## 2016-08-01 ENCOUNTER — Telehealth: Payer: Self-pay | Admitting: Family Medicine

## 2016-08-01 ENCOUNTER — Ambulatory Visit
Admission: RE | Admit: 2016-08-01 | Discharge: 2016-08-01 | Disposition: A | Discharge status: Home / Self Care | Payer: No Typology Code available for payment source | Source: Ambulatory Visit | Attending: Family Medicine | Admitting: Family Medicine

## 2016-08-01 DIAGNOSIS — R109 Unspecified abdominal pain: Secondary | ICD-10-CM

## 2016-08-01 NOTE — Telephone Encounter (Signed)
I tried to reach pt and no answer or option to leave a message.  

## 2016-08-01 NOTE — Telephone Encounter (Signed)
Pt would like to know when her imaging results are back, with a call back.

## 2016-08-27 ENCOUNTER — Telehealth: Payer: Self-pay | Admitting: Family Medicine

## 2016-08-27 DIAGNOSIS — Z794 Long term (current) use of insulin: Principal | ICD-10-CM

## 2016-08-27 DIAGNOSIS — E119 Type 2 diabetes mellitus without complications: Secondary | ICD-10-CM

## 2016-08-27 DIAGNOSIS — E039 Hypothyroidism, unspecified: Secondary | ICD-10-CM

## 2016-08-27 NOTE — Telephone Encounter (Signed)
Referral was done  

## 2016-08-27 NOTE — Telephone Encounter (Signed)
Pt has orange card and needs to [facility] and they will refer pt to endo who accepts orange care. The fax # is [phone]

## 2016-08-28 NOTE — Telephone Encounter (Signed)
I tried to reach pt and no answer or option to leave a voice message.

## 2016-09-03 ENCOUNTER — Encounter: Payer: Self-pay | Admitting: Family Medicine

## 2016-09-23 NOTE — Telephone Encounter (Signed)
Pt need for some to call [facility]  [phone] for a referral to the endocrinologist.

## 2016-09-23 NOTE — Addendum Note (Signed)
Addended by: [name] A on: 09/23/2016 05:07 PM   Modules accepted: Orders

## 2016-09-23 NOTE — Telephone Encounter (Signed)
I did the referral to [facility]

## 2016-09-24 NOTE — Telephone Encounter (Signed)
Can you check on referral for pt? Also we cannot order in labs due to insurance. When pt goes for appointment they will order labs.

## 2016-10-08 ENCOUNTER — Other Ambulatory Visit: Payer: Self-pay | Admitting: Family Medicine

## 2016-10-08 NOTE — Telephone Encounter (Signed)
Call in Temazepam #30 with 5 rf, also Alendronate #4 with 11 rf

## 2016-10-13 ENCOUNTER — Other Ambulatory Visit: Payer: Self-pay | Admitting: Family Medicine

## 2016-10-15 ENCOUNTER — Encounter: Payer: Self-pay | Admitting: Family Medicine

## 2016-10-15 ENCOUNTER — Telehealth: Payer: Self-pay | Admitting: Endocrinology

## 2016-10-15 ENCOUNTER — Telehealth: Payer: Self-pay | Admitting: Family Medicine

## 2016-10-15 MED ORDER — INSULIN LISPRO 100 UNIT/ML (KWIKPEN): PEN_INJECTOR | SUBCUTANEOUS | 2 refills | Status: DC

## 2016-10-15 NOTE — Telephone Encounter (Signed)
° ° ° °  [facility] said the following rx was left on there voicemail for a refill. She said they do not fill control substance     temazepam (RESTORIL) 30 MG capsule    Pharmacy; [place] a qol

## 2016-10-15 NOTE — Telephone Encounter (Signed)
Please refill x 3 mos Ov is due 

## 2016-10-15 NOTE — Telephone Encounter (Signed)
See message and please advise if ok to refill.  Last appointment ws 10/17/2016

## 2016-10-15 NOTE — Telephone Encounter (Signed)
Refill submitted per patient's request.  

## 2016-10-15 NOTE — Telephone Encounter (Signed)
Patient need a new prescription for the insulin lispro (HUMALOG KWIKPEN) 100 UNIT/ML [place] [phone] (Phone) [phone] (Fax)

## 2016-10-16 NOTE — Telephone Encounter (Signed)
Duplicate, see previous note 

## 2016-10-17 ENCOUNTER — Telehealth: Payer: Self-pay

## 2016-10-17 NOTE — Telephone Encounter (Signed)
We can't change the dosage, dur to pt safety requirements. However, it is cheap to buy a vial of reg insulin at [facility], and takes at the same dosage of humalog

## 2016-10-17 NOTE — Telephone Encounter (Signed)
Informed Pt of Dr. Cordelia Pen message, she had no further questions.

## 2016-10-17 NOTE — Telephone Encounter (Signed)
Patient called about the Humalog refill from 10/15/2016. On the refill the dosage states to inject 100 units daily. Patient stated due to her blood sugars she has been taking between 120-150 units daily. Patient states she has lost her insurance and currently has an orange card and is seeing a new Endocrinologist on Monday. However, she is completley out of her insulin. Patient is asking if we could change the refill to the increased dosage so she will have insulin tonight and over the weekend until her appointment on monday? Please advise. Thanks!

## 2016-11-19 ENCOUNTER — Ambulatory Visit (INDEPENDENT_AMBULATORY_CARE_PROVIDER_SITE_OTHER): Payer: Self-pay | Admitting: Family Medicine

## 2016-11-19 DIAGNOSIS — Z23 Encounter for immunization: Secondary | ICD-10-CM

## 2016-12-08 ENCOUNTER — Encounter: Payer: Self-pay | Admitting: Family Medicine

## 2016-12-08 NOTE — Telephone Encounter (Signed)
I would be happy to do referrals but we need to know who she can see. Ask her to find out who of these specialists takes her insurance and let us know, then we can go from there

## 2016-12-10 NOTE — Telephone Encounter (Signed)
This is a duplicate, see previous my chart encounter.

## 2016-12-19 ENCOUNTER — Telehealth: Payer: Self-pay | Admitting: Family Medicine

## 2016-12-19 NOTE — Telephone Encounter (Signed)
I just faxed over completed referrals to [facility] and I left a voice message with this information.

## 2016-12-19 NOTE — Telephone Encounter (Signed)
Bon[facility] with [facility] returning your call. Please call back

## 2016-12-29 ENCOUNTER — Other Ambulatory Visit: Payer: Self-pay | Admitting: Family Medicine

## 2016-12-29 NOTE — Telephone Encounter (Signed)
Refill request for Synthroid 150 mcg take 1 po qd and send to [facility].

## 2016-12-31 MED ORDER — LEVOTHYROXINE SODIUM 150 MCG PO TABS: 150.0000 ug | ORAL_TABLET | Freq: Every day | ORAL | 0 refills | Status: DC

## 2016-12-31 NOTE — Telephone Encounter (Signed)
Script was printed, however I called in to [facility].

## 2017-01-05 ENCOUNTER — Ambulatory Visit: Payer: Self-pay | Attending: Internal Medicine | Admitting: Podiatry

## 2017-01-05 DIAGNOSIS — E119 Type 2 diabetes mellitus without complications: Secondary | ICD-10-CM

## 2017-01-05 DIAGNOSIS — Z794 Long term (current) use of insulin: Secondary | ICD-10-CM

## 2017-01-09 NOTE — Progress Notes (Signed)
[facility]  Subjective: 61 year old female presents the office today for diabetic foot evaluation. She states that she does not have any issues to her feet. Her last A1c was 8.7. Blood sugars running between 200-300. She gets some occasional numbness or tingling to her feet but this is not consistent and is intermittent. She denies any open sores. Denies any systemic complaints such as fevers, chills, nausea, vomiting. No acute changes since last appointment, and no other complaints at this time.   Objective: AAO x3, NAD DP/PT pulses palpable bilaterally 2/4, CRT less than 3 seconds Sensation appears to be intact. There is no area of tenderness identified to bilateral lower extremities. There is no overlying edema, erythema, increase in warmth. There is no interdigital maceration.  No open lesions or pre-ulcerative lesions.  No pain with calf compression, swelling, warmth, erythema  Assessment: 61 year old female presents for diabetic foot evaluation  Plan: -All treatment options discussed with the patient including all alternatives, risks, complications.  -At this time she is having no issues to her feet. Discussed that she likely has early neuropathy symptoms. Discussed that symptoms continue we can start medication for this but we will on today. -Daily foot inspection discussed. Supportive shoes were also discussed. -Patient encouraged to call the office with any questions, concerns, change in symptoms.   Celesta Gentile, dPM

## 2017-01-20 ENCOUNTER — Telehealth: Payer: Self-pay | Admitting: Family Medicine

## 2017-01-20 MED ORDER — SERTRALINE HCL 100 MG PO TABS: 100.0000 mg | ORAL_TABLET | Freq: Every day | ORAL | 11 refills | Status: DC

## 2017-01-20 NOTE — Telephone Encounter (Signed)
Rx called in 

## 2017-01-20 NOTE — Telephone Encounter (Signed)
Call in #30 with 11 rf 

## 2017-01-20 NOTE — Telephone Encounter (Signed)
Refill request for Sertraline 100 mg take 1 po qd and a 30 day supply to [facility].

## 2017-01-22 ENCOUNTER — Encounter: Payer: Self-pay | Admitting: Family Medicine

## 2017-01-23 NOTE — Telephone Encounter (Signed)
I suppose we could draw them here, he would need to let us know what he wants to check

## 2017-01-27 ENCOUNTER — Other Ambulatory Visit: Payer: Self-pay | Admitting: Family Medicine

## 2017-01-27 NOTE — Telephone Encounter (Signed)
Call in #30 with 5 rf 

## 2017-01-29 ENCOUNTER — Telehealth: Payer: Self-pay | Admitting: Family Medicine

## 2017-01-29 DIAGNOSIS — E1100 Type 2 diabetes mellitus with hyperosmolarity without nonketotic hyperglycemic-hyperosmolar coma (NKHHC): Secondary | ICD-10-CM

## 2017-01-29 DIAGNOSIS — Z794 Long term (current) use of insulin: Secondary | ICD-10-CM

## 2017-01-29 DIAGNOSIS — E559 Vitamin D deficiency, unspecified: Secondary | ICD-10-CM

## 2017-01-29 NOTE — Telephone Encounter (Signed)
Pt dropped off a lab order from another provider, per Dr Sarajane Jews we no longer do these. I tried to reach pt by phone and no answer. I sent pt a my chart message with this information. Pt still has the hard copy, she will need to check with another lab company.

## 2017-01-30 ENCOUNTER — Other Ambulatory Visit: Payer: Self-pay | Admitting: Family Medicine

## 2017-01-30 NOTE — Telephone Encounter (Signed)
The labs are ordered

## 2017-01-30 NOTE — Telephone Encounter (Signed)
Pt has been scheduled.  °

## 2017-01-30 NOTE — Telephone Encounter (Signed)
Can you call pt to schedule a fasting lab appointment? Order is in computer.

## 2017-01-30 NOTE — Addendum Note (Signed)
Addended by: [name] A on: 01/30/2017 12:14 PM   Modules accepted: Orders

## 2017-01-30 NOTE — Telephone Encounter (Signed)
I understand. I will go ahead and order the tests here.

## 2017-01-30 NOTE — Telephone Encounter (Signed)
We are waiting on lab results, pt will call back to order.

## 2017-01-30 NOTE — Telephone Encounter (Signed)
Refill request for Atorvastatin 80 mg take 1/2 tablet every day and send to [facility].

## 2017-02-03 ENCOUNTER — Other Ambulatory Visit (INDEPENDENT_AMBULATORY_CARE_PROVIDER_SITE_OTHER): Payer: Self-pay

## 2017-02-03 DIAGNOSIS — E1100 Type 2 diabetes mellitus with hyperosmolarity without nonketotic hyperglycemic-hyperosmolar coma (NKHHC): Secondary | ICD-10-CM

## 2017-02-03 DIAGNOSIS — E559 Vitamin D deficiency, unspecified: Secondary | ICD-10-CM

## 2017-02-03 DIAGNOSIS — Z794 Long term (current) use of insulin: Secondary | ICD-10-CM

## 2017-02-03 LAB — BASIC METABOLIC PANEL
BUN: 7 mg/dL (ref 6–23)
CALCIUM: 9.3 mg/dL (ref 8.4–10.5)
CO2: 33 meq/L — AB (ref 19–32)
Chloride: 98 mEq/L (ref 96–112)
Creatinine, Ser: 0.59 mg/dL (ref 0.40–1.20)
GFR: 110.22 mL/min (ref >60.00)
GLUCOSE: 210 mg/dL — AB (ref 70–99)
Potassium: 4.4 mEq/L (ref 3.5–5.1)
SODIUM: 138 meq/L (ref 135–145)

## 2017-02-03 LAB — HEPATIC FUNCTION PANEL
ALBUMIN: 3.9 g/dL (ref 3.5–5.2)
ALK PHOS: 121 U/L — AB (ref 39–117)
ALT: 39 U/L — ABNORMAL HIGH (ref 0–35)
AST: 29 U/L (ref 0–37)
Bilirubin, Direct: 0.1 mg/dL (ref 0.0–0.3)
TOTAL PROTEIN: 7.2 g/dL (ref 6.0–8.3)
Total Bilirubin: 0.4 mg/dL (ref 0.2–1.2)

## 2017-02-03 LAB — LIPID PANEL
CHOLESTEROL: 133 mg/dL (ref 0–200)
HDL: 28.4 mg/dL — AB (ref >39.00)
LDL Cholesterol: 75 mg/dL (ref 0–99)
NONHDL: 104.29
Total CHOL/HDL Ratio: 5
Triglycerides: 147 mg/dL (ref 0.0–149.0)
VLDL: 29.4 mg/dL (ref 0.0–40.0)

## 2017-02-03 LAB — TSH: TSH: 7.52 u[IU]/mL — ABNORMAL HIGH (ref 0.35–4.50)

## 2017-02-03 LAB — HEMOGLOBIN A1C: HEMOGLOBIN A1C: 9.7 % — AB (ref 4.6–6.5)

## 2017-02-03 LAB — VITAMIN D 25 HYDROXY (VIT D DEFICIENCY, FRACTURES): VITD: 6.42 ng/mL — ABNORMAL LOW (ref 30.00–100.00)

## 2017-02-04 MED ORDER — ATORVASTATIN CALCIUM 40 MG PO TABS: 40.0000 mg | ORAL_TABLET | Freq: Every day | ORAL | 3 refills | Status: DC

## 2017-02-04 NOTE — Telephone Encounter (Signed)
Pt had labs done, script was printed and faxed to [facility].

## 2017-02-04 NOTE — Addendum Note (Signed)
Addended by: Aggie Hacker A on: 02/04/2017 09:09 AM   Modules accepted: Orders

## 2017-02-05 ENCOUNTER — Telehealth: Payer: Self-pay | Admitting: Family Medicine

## 2017-02-05 NOTE — Telephone Encounter (Signed)
error 

## 2017-02-09 ENCOUNTER — Encounter: Payer: Self-pay | Admitting: Family Medicine

## 2017-02-09 ENCOUNTER — Ambulatory Visit (INDEPENDENT_AMBULATORY_CARE_PROVIDER_SITE_OTHER): Payer: No Typology Code available for payment source | Admitting: Family Medicine

## 2017-02-09 VITALS — BP 148/83 | HR 86 | Temp 98.4°F | Ht 60.0 in | Wt 238.0 lb

## 2017-02-09 DIAGNOSIS — Z794 Long term (current) use of insulin: Secondary | ICD-10-CM

## 2017-02-09 DIAGNOSIS — E119 Type 2 diabetes mellitus without complications: Secondary | ICD-10-CM

## 2017-02-09 DIAGNOSIS — E039 Hypothyroidism, unspecified: Secondary | ICD-10-CM

## 2017-02-09 HISTORY — DX: Type 2 diabetes mellitus without complications: E11.9

## 2017-02-09 NOTE — Patient Instructions (Signed)
WE NOW OFFER   [facility] FAST TRACK!!!  SAME DAY Appointments for ACUTE CARE  Such as: Sprains, Injuries, cuts, abrasions, rashes, muscle pain, joint pain, back pain Colds, flu, sore throats, headache, allergies, cough, fever  Ear pain, sinus and eye infections Abdominal pain, nausea, vomiting, diarrhea, upset stomach Animal/insect bites  3 Easy Ways to Schedule: Walk-In Scheduling Call in scheduling Mychart Sign-up: [email or web address]         

## 2017-02-09 NOTE — Progress Notes (Signed)
   Subjective:    Patient ID: Bonnie Gonzalez, female    DOB: 14-Aug-1956, 61 y.o.   MRN: [id number]  HPI Here to follow up. She feels well in general but she has felt tired lately and has gained some weight.  She is seeing Dr. Carlis Abbott and we ran some labs here last week. This showed her vitamin D to be low and her thyroid level to be low.   Review of Systems  Constitutional: Positive for fatigue.  Respiratory: Negative.   Cardiovascular: Negative.   Neurological: Negative.        Objective:   Physical Exam  Constitutional: She is oriented to person, place, and time. She appears well-developed and well-nourished.  Neck: No thyromegaly present.  Cardiovascular: Normal rate, regular rhythm, normal heart sounds and intact distal pulses.   Pulmonary/Chest: Effort normal and breath sounds normal.  Lymphadenopathy:    She has no cervical adenopathy.  Neurological: She is alert and oriented to person, place, and time.          Assessment & Plan:  Her hypothyroidism and diabetes are being treated by Dr. Carlis Abbott, so he will be adjusting her medications. I suggested she start taking vitamin D 5000 units a day.  [name], MD

## 2017-02-09 NOTE — Progress Notes (Signed)
Pre visit review using our clinic review tool, if applicable. No additional management support is needed unless otherwise documented below in the visit note. 

## 2017-02-23 ENCOUNTER — Other Ambulatory Visit: Payer: Self-pay | Admitting: Family Medicine

## 2017-02-23 MED ORDER — DESLORATADINE 5 MG PO TABS: 5.0000 mg | ORAL_TABLET | Freq: Every day | ORAL | 6 refills | Status: DC

## 2017-02-23 NOTE — Telephone Encounter (Signed)
I printed and faxed to requested pharmacy.

## 2017-02-23 NOTE — Telephone Encounter (Signed)
Refill request for Clarinex 5 mg and a 30 day supply to [facility].

## 2017-03-02 ENCOUNTER — Other Ambulatory Visit: Payer: Self-pay | Admitting: Family Medicine

## 2017-03-02 NOTE — Telephone Encounter (Signed)
Refill request for Dexilant 60 mg take 1 po qd and a 90 day supply to [facility].

## 2017-03-03 ENCOUNTER — Encounter: Payer: Self-pay | Admitting: Family Medicine

## 2017-03-04 ENCOUNTER — Other Ambulatory Visit: Payer: Self-pay | Admitting: Family Medicine

## 2017-03-04 ENCOUNTER — Encounter: Payer: Self-pay | Admitting: Family Medicine

## 2017-03-04 DIAGNOSIS — E039 Hypothyroidism, unspecified: Secondary | ICD-10-CM

## 2017-03-04 MED ORDER — DEXLANSOPRAZOLE 60 MG PO CPDR: 60.0000 mg | DELAYED_RELEASE_CAPSULE | Freq: Every day | ORAL | 1 refills | Status: DC

## 2017-03-04 MED ORDER — ALPRAZOLAM 0.5 MG PO TABS: ORAL_TABLET | ORAL | 1 refills | Status: DC

## 2017-03-04 NOTE — Telephone Encounter (Signed)
Per Dr. Sarajane Jews okay to call in for # 60 with 1 refill.

## 2017-03-04 NOTE — Telephone Encounter (Signed)
I called in script to [facility] for a 90 day supply with refill.

## 2017-03-06 ENCOUNTER — Telehealth: Payer: Self-pay | Admitting: Family Medicine

## 2017-03-06 DIAGNOSIS — E039 Hypothyroidism, unspecified: Secondary | ICD-10-CM

## 2017-03-06 NOTE — Telephone Encounter (Signed)
Pt would like an order for a thyroid lab.   Pt will be back in town 4/24 and would like that day. Is it ok to schedule?

## 2017-03-09 NOTE — Telephone Encounter (Signed)
These were ordered as future labs

## 2017-03-09 NOTE — Telephone Encounter (Signed)
I put in a future order for a TSH

## 2017-03-24 ENCOUNTER — Other Ambulatory Visit (INDEPENDENT_AMBULATORY_CARE_PROVIDER_SITE_OTHER): Payer: No Typology Code available for payment source

## 2017-03-24 ENCOUNTER — Encounter: Payer: Self-pay | Admitting: Family Medicine

## 2017-03-24 DIAGNOSIS — E039 Hypothyroidism, unspecified: Secondary | ICD-10-CM

## 2017-03-24 LAB — TSH: TSH: 1.61 u[IU]/mL (ref 0.35–4.50)

## 2017-03-24 LAB — T4, FREE: Free T4: 0.98 ng/dL (ref 0.60–1.60)

## 2017-03-24 LAB — T3, FREE: T3, Free: 3.7 pg/mL (ref 2.3–4.2)

## 2017-04-09 NOTE — Telephone Encounter (Signed)
Pt was scheduled for 4/24

## 2017-05-24 ENCOUNTER — Encounter: Payer: Self-pay | Admitting: Family Medicine

## 2017-06-01 ENCOUNTER — Other Ambulatory Visit: Payer: Self-pay | Admitting: Family Medicine

## 2017-06-01 MED ORDER — HYDROXYZINE PAMOATE 25 MG PO CAPS: ORAL_CAPSULE | ORAL | 0 refills | Status: DC

## 2017-06-02 NOTE — Telephone Encounter (Signed)
Script for Vistaril was called in to [facility]. At [phone].

## 2017-06-05 ENCOUNTER — Encounter: Payer: Self-pay | Admitting: Family Medicine

## 2017-06-05 ENCOUNTER — Other Ambulatory Visit: Payer: Self-pay | Admitting: Family Medicine

## 2017-06-05 ENCOUNTER — Ambulatory Visit (INDEPENDENT_AMBULATORY_CARE_PROVIDER_SITE_OTHER): Payer: No Typology Code available for payment source | Admitting: Family Medicine

## 2017-06-05 VITALS — BP 130/68 | Temp 98.4°F | Ht 60.0 in | Wt 243.0 lb

## 2017-06-05 DIAGNOSIS — R1013 Epigastric pain: Secondary | ICD-10-CM

## 2017-06-05 DIAGNOSIS — F411 Generalized anxiety disorder: Secondary | ICD-10-CM

## 2017-06-05 DIAGNOSIS — F321 Major depressive disorder, single episode, moderate: Secondary | ICD-10-CM

## 2017-06-05 LAB — POC URINALSYSI DIPSTICK (AUTOMATED)
BILIRUBIN UA: NEGATIVE
Blood, UA: NEGATIVE
GLUCOSE UA: NEGATIVE
KETONES UA: NEGATIVE
Nitrite, UA: NEGATIVE
PROTEIN UA: NEGATIVE
Urobilinogen, UA: 0.2 E.U./dL
pH, UA: 6 (ref 5.0–8.0)

## 2017-06-05 MED ORDER — DEXLANSOPRAZOLE 60 MG PO CPDR: 60.0000 mg | DELAYED_RELEASE_CAPSULE | Freq: Two times a day (BID) | ORAL | 1 refills | Status: DC

## 2017-06-05 MED ORDER — VENLAFAXINE HCL ER 150 MG PO CP24: 150.0000 mg | ORAL_CAPSULE | Freq: Every day | ORAL | 5 refills | Status: DC

## 2017-06-05 NOTE — Progress Notes (Signed)
   Subjective:    Patient ID: Bonnie Gonzalez, female    DOB: 1956/10/30, 61 y.o.   MRN: [id number]  HPI Here for several issues. First over the past 10 days she has had frequent epigastric and RUQ pains, along with nausea. No fever or vomiting. Eating makes it worse. She takes Dexilant once a day. Also her depression and anxiety have been getting a little worse lately. No bed events in her life recently but she thinks the Zoloft is not as effective as it used to be. Appetite and sleep are intact.    Review of Systems  Constitutional: Negative.   Respiratory: Negative.   Cardiovascular: Negative.   Gastrointestinal: Positive for abdominal pain and nausea. Negative for abdominal distention, anal bleeding, blood in stool, constipation, diarrhea, rectal pain and vomiting.  Genitourinary: Negative.   Neurological: Negative.   Psychiatric/Behavioral: Positive for dysphoric mood. The patient is nervous/anxious.        Objective:   Physical Exam  Constitutional: She is oriented to person, place, and time. She appears well-developed and well-nourished. No distress.  Cardiovascular: Normal rate, regular rhythm, normal heart sounds and intact distal pulses.   Pulmonary/Chest: Effort normal and breath sounds normal.  Abdominal: Soft. Bowel sounds are normal. She exhibits no distension and no mass. There is no rebound and no guarding.  Tender in the epigastrium and RUQ   Neurological: She is alert and oriented to person, place, and time.  Psychiatric: She has a normal mood and affect. Her behavior is normal. Thought content normal.          Assessment & Plan:  The epigastric pain is likely from gall bladder disease so we will set her up for an abdominal US soon. In the meantime in case this is from duodenitis she will increase the Dexilant to bid. For the depression and anxiety we will switch from Zoloft to Effexor XR 150 mg daily.  [name], MD

## 2017-06-05 NOTE — Patient Instructions (Signed)
WE NOW OFFER   [facility] FAST TRACK!!!  SAME DAY Appointments for ACUTE CARE  Such as: Sprains, Injuries, cuts, abrasions, rashes, muscle pain, joint pain, back pain Colds, flu, sore throats, headache, allergies, cough, fever  Ear pain, sinus and eye infections Abdominal pain, nausea, vomiting, diarrhea, upset stomach Animal/insect bites  3 Easy Ways to Schedule: Walk-In Scheduling Call in scheduling Mychart Sign-up: [email or web address]         

## 2017-06-05 NOTE — Telephone Encounter (Signed)
Refill request for Effexor XR, pt was seen in office today and script was printed. I called in script to [facility], left on pharmacy voice mail.

## 2017-06-09 MED ORDER — DULOXETINE HCL 30 MG PO CPEP
30.0000 mg | ORAL_CAPSULE | Freq: Every day | ORAL | 3 refills | Status: DC
Start: 2017-06-09 — End: 2018-01-19

## 2017-06-09 NOTE — Telephone Encounter (Signed)
rx is ready to fax over

## 2017-06-12 ENCOUNTER — Encounter: Payer: Self-pay | Admitting: Family Medicine

## 2017-06-17 ENCOUNTER — Encounter: Payer: Self-pay | Admitting: Family Medicine

## 2017-06-17 ENCOUNTER — Ambulatory Visit
Admission: RE | Admit: 2017-06-17 | Discharge: 2017-06-17 | Disposition: A | Discharge status: Home / Self Care | Payer: No Typology Code available for payment source | Source: Ambulatory Visit | Attending: Family Medicine | Admitting: Family Medicine

## 2017-06-17 DIAGNOSIS — R1013 Epigastric pain: Secondary | ICD-10-CM

## 2017-06-17 NOTE — Telephone Encounter (Signed)
See my Result Note on the [name]. As for the TB test, I do not know of any other place to get this done any more cheaply. I suggest she go to the health dept for this

## 2017-06-19 ENCOUNTER — Other Ambulatory Visit: Payer: Self-pay | Admitting: Family Medicine

## 2017-06-19 NOTE — Telephone Encounter (Signed)
Pt calling stating that she will be starting a new job (on Monday) and would like to see if she could get these refilled today if possible.

## 2017-06-22 NOTE — Telephone Encounter (Signed)
Call in Temazepam #30 with 5 rf, also Alprazolam #60 with 5 rf

## 2017-06-29 ENCOUNTER — Emergency Department (HOSPITAL_COMMUNITY): Payer: Self-pay

## 2017-06-29 ENCOUNTER — Encounter (HOSPITAL_COMMUNITY): Payer: Self-pay | Admitting: Emergency Medicine

## 2017-06-29 ENCOUNTER — Emergency Department (HOSPITAL_COMMUNITY)
Admission: EM | Admit: 2017-06-29 | Discharge: 2017-06-29 | Disposition: A | Discharge status: Home / Self Care | Payer: Self-pay | Attending: Emergency Medicine | Admitting: Emergency Medicine

## 2017-06-29 DIAGNOSIS — Z79899 Other long term (current) drug therapy: Secondary | ICD-10-CM | POA: Insufficient documentation

## 2017-06-29 DIAGNOSIS — R7989 Other specified abnormal findings of blood chemistry: Secondary | ICD-10-CM | POA: Insufficient documentation

## 2017-06-29 DIAGNOSIS — Z87891 Personal history of nicotine dependence: Secondary | ICD-10-CM | POA: Insufficient documentation

## 2017-06-29 DIAGNOSIS — E119 Type 2 diabetes mellitus without complications: Secondary | ICD-10-CM | POA: Insufficient documentation

## 2017-06-29 DIAGNOSIS — Z794 Long term (current) use of insulin: Secondary | ICD-10-CM | POA: Insufficient documentation

## 2017-06-29 DIAGNOSIS — E039 Hypothyroidism, unspecified: Secondary | ICD-10-CM | POA: Insufficient documentation

## 2017-06-29 DIAGNOSIS — R1011 Right upper quadrant pain: Secondary | ICD-10-CM | POA: Insufficient documentation

## 2017-06-29 DIAGNOSIS — R945 Abnormal results of liver function studies: Secondary | ICD-10-CM

## 2017-06-29 DIAGNOSIS — Z885 Allergy status to narcotic agent status: Secondary | ICD-10-CM | POA: Insufficient documentation

## 2017-06-29 DIAGNOSIS — Z880 Allergy status to penicillin: Secondary | ICD-10-CM | POA: Insufficient documentation

## 2017-06-29 LAB — URINALYSIS, ROUTINE W REFLEX MICROSCOPIC
Bacteria, UA: NONE SEEN
Bilirubin Urine: NEGATIVE
Glucose, UA: NEGATIVE mg/dL
Hgb urine dipstick: NEGATIVE
KETONES UR: NEGATIVE mg/dL
Nitrite: NEGATIVE
PH: 7 (ref 5.0–8.0)
PROTEIN: NEGATIVE mg/dL
Specific Gravity, Urine: 1.01 (ref 1.005–1.030)

## 2017-06-29 LAB — CBC
HCT: 44.1 % (ref 36.0–46.0)
Hemoglobin: 13.7 g/dL (ref 12.0–15.0)
MCH: 27.3 pg (ref 26.0–34.0)
MCHC: 31.1 g/dL (ref 30.0–36.0)
MCV: 88 fL (ref 78.0–100.0)
PLATELETS: 204 10*3/uL (ref 150–400)
RBC: 5.01 MIL/uL (ref 3.87–5.11)
RDW: 15.5 % (ref 11.5–15.5)
WBC: 12.4 10*3/uL — ABNORMAL HIGH (ref 4.0–10.5)

## 2017-06-29 LAB — COMPREHENSIVE METABOLIC PANEL
ALK PHOS: 128 U/L — AB (ref 38–126)
ALT: 94 U/L — AB (ref 14–54)
AST: 101 U/L — AB (ref 15–41)
Albumin: 3.7 g/dL (ref 3.5–5.0)
Anion gap: 8 (ref 5–15)
BUN: 9 mg/dL (ref 6–20)
CHLORIDE: 98 mmol/L — AB (ref 101–111)
CO2: 29 mmol/L (ref 22–32)
CREATININE: 0.7 mg/dL (ref 0.44–1.00)
Calcium: 9.2 mg/dL (ref 8.9–10.3)
GFR calc Af Amer: >60 mL/min (ref >60)
Glucose, Bld: 237 mg/dL — ABNORMAL HIGH (ref 65–99)
Potassium: 4.2 mmol/L (ref 3.5–5.1)
Sodium: 135 mmol/L (ref 135–145)
Total Bilirubin: 0.6 mg/dL (ref 0.3–1.2)
Total Protein: 7.4 g/dL (ref 6.5–8.1)

## 2017-06-29 LAB — DIFFERENTIAL
BASOS ABS: 0 10*3/uL (ref 0.0–0.1)
Basophils Relative: 0 %
Eosinophils Absolute: 0.3 10*3/uL (ref 0.0–0.7)
Eosinophils Relative: 2 %
Lymphocytes Relative: 22 %
Lymphs Abs: 2.7 10*3/uL (ref 0.7–4.0)
MONOS PCT: 7 %
Monocytes Absolute: 0.9 10*3/uL (ref 0.1–1.0)
NEUTROS ABS: 8.6 10*3/uL — AB (ref 1.7–7.7)
Neutrophils Relative %: 69 %

## 2017-06-29 LAB — TROPONIN I

## 2017-06-29 LAB — LIPASE, BLOOD: LIPASE: 35 U/L (ref 11–51)

## 2017-06-29 MED ORDER — ONDANSETRON HCL 4 MG PO TABS: 4.0000 mg | ORAL_TABLET | Freq: Four times a day (QID) | ORAL | 0 refills | Status: DC

## 2017-06-29 MED ORDER — IOPAMIDOL (ISOVUE-300) INJECTION 61%
INTRAVENOUS | Status: AC
  Administered 2017-06-29: 100 mL
  Filled 2017-06-29: qty 100

## 2017-06-29 MED ORDER — MORPHINE SULFATE (PF) 4 MG/ML IV SOLN
4.0000 mg | Freq: Once | INTRAVENOUS | Status: AC
  Administered 2017-06-29: 4 mg via INTRAVENOUS
  Filled 2017-06-29: qty 1

## 2017-06-29 MED ORDER — HYDROMORPHONE HCL 2 MG PO TABS: 1.0000 mg | ORAL_TABLET | ORAL | 0 refills | Status: DC | PRN

## 2017-06-29 MED ORDER — ONDANSETRON HCL 4 MG/2ML IJ SOLN
4.0000 mg | Freq: Once | INTRAMUSCULAR | Status: AC
  Administered 2017-06-29: 4 mg via INTRAVENOUS
  Filled 2017-06-29: qty 2

## 2017-06-29 NOTE — ED Triage Notes (Signed)
RUQ abdominal pain that started around 1900 that is going to her right shoulder, neck, and back. Pain has moved to the RLQ. Pt c/o of intermittent sharp pain with nausea. Lots of gas but no BMs. BM today

## 2017-06-29 NOTE — ED Notes (Signed)
Pt understood dc material. NAD noted. Scripts given at dc 

## 2017-06-29 NOTE — ED Provider Notes (Signed)
[facility] DEPT Provider Note   CSN: [id number] Arrival date & time: 06/29/17  9518   By signing my name below, I, Eunice Blase, attest that this documentation has been prepared under the direction and in the presence of Delora Fuel, MD. Electronically signed, Eunice Blase, ED Scribe. 06/29/17. 4:07 AM.   History   Chief Complaint Chief Complaint  Patient presents with  . Abdominal Pain   The history is provided by the patient and medical records. No language interpreter was used.    Bonnie Gonzalez is a 61 y.o. female with h/o IBS, GERD and T2DM presenting to the Emergency Department concerning severe R sided abdominal pain onset ~1900 last night. She states the pain began in her RUQ and then spread to the RLQ, R sided neck, shoulder and back. Associated nausea. She states she had constipation recently, and she states diarrhea began within the last 24 hours. Pt describes 8/10, sharp, constant, worsening pain that is worse certain movements. Pt states she at a chicken cutlet and broccoli prior to onset of presenting pain. She states she was evaluated with her PCP 3 weeks ago for similar pains; enlarged spleen found on referred [name]. No, fever, vomit or any other complaints noted at this time.   Past Medical History:  Diagnosis Date  . Allergy   . Anxiety   . Depression   . Diabetes mellitus   . GERD (gastroesophageal reflux disease)   . IBS (irritable bowel syndrome)   . Insomnia   . Tuberculosis    hydrothyroidism    Patient Active Problem List   Diagnosis Date Noted  . Type 2 diabetes mellitus without complications ([place]) 84/16/6063  . Back pain 10/18/2015  . Wellness examination 10/18/2015  . OSA (obstructive sleep apnea) 07/26/2015  . Viral pharyngitis 02/10/2015  . Morbid obesity ([place]) 05/15/2014  . Vertigo 03/29/2014  . DYSHIDROTIC ECZEMA 12/13/2010  . SKIN TAG 10/09/2010  . SKIN LESION 10/09/2010  . Hypothyroidism 10/01/2010  . HYPERLIPIDEMIA, MIXED 07/20/2009    . Anxiety state 07/20/2009  . ALLERGIC RHINITIS 07/20/2009  . IRRITABLE BOWEL SYNDROME 07/13/2009  . POSTMENOPAUSAL STATUS 07/13/2009  . CONTACT DERMATITIS 03/26/2009  . TOBACCO USE 03/03/2009  . SINUSITIS - ACUTE-NOS 03/03/2009  . Depression 11/03/2007  . OTHER ACUTE SINUSITIS 11/03/2007  . GERD 11/03/2007  . HEADACHE 11/03/2007  . ABDOMINAL PAIN, EPIGASTRIC 11/03/2007    Past Surgical History:  Procedure Laterality Date  . benign tumor      removed from groin  . WRIST ARTHROCENTESIS N/A 2001    OB History    No data available       Home Medications    Prior to Admission medications   Medication Sig Start Date End Date Taking? Authorizing Provider  albuterol (PROVENTIL HFA;VENTOLIN HFA) 108 (90 BASE) MCG/ACT inhaler Inhale 2 puffs into the lungs every 4 (four) hours as needed for wheezing or shortness of breath. 06/19/15   Laurey Morale, MD  alendronate (FOSAMAX) 70 MG tablet TAKE 1 TABLET EVERY 7 DAYS. TAKE WITH A FULL GLASS OF WATER ON AN EMPTY STOMACH. 10/10/16   Laurey Morale, MD  ALPRAZolam Duanne Moron) 0.5 MG tablet TAKE 1 TABLET BY MOUTH TWICE A DAY AS NEEDED 06/22/17   Laurey Morale, MD  atorvastatin (LIPITOR) 40 MG tablet Take 1 tablet (40 mg total) by mouth daily. 02/04/17   Laurey Morale, MD  cyclobenzaprine (FLEXERIL) 10 MG tablet Take 1 tablet (10 mg total) by mouth 3 (three) times daily as needed  for muscle spasms. 06/16/16   Laurey Morale, MD  desloratadine (CLARINEX) 5 MG tablet Take 1 tablet (5 mg total) by mouth daily. 02/23/17   Laurey Morale, MD  dexlansoprazole (DEXILANT) 60 MG capsule Take 1 capsule (60 mg total) by mouth 2 (two) times daily. 06/05/17   Laurey Morale, MD  DULoxetine (CYMBALTA) 30 MG capsule Take 1 capsule (30 mg total) by mouth daily. 06/09/17   Laurey Morale, MD  halobetasol (ULTRAVATE) 0.05 % cream Apply topically 2 (two) times daily. 05/15/14   Laurey Morale, MD  hydrOXYzine (VISTARIL) 25 MG capsule TAKE 1 CAPSULE 3 TIMES     DAILY AS  NEEDED 06/01/17   Laurey Morale, MD  ibuprofen (ADVIL,MOTRIN) 600 MG tablet Take 600 mg by mouth every 6 (six) hours as needed (pain).     [provider]  Insulin Glargine (TOUJEO SOLOSTAR) 300 UNIT/ML SOPN Inject 260 Units into the skin daily. 12/13/15   Renato Shin, MD  insulin lispro (HUMALOG KWIKPEN) 100 UNIT/ML KiwkPen 100 units into skin daily with supper. 10/15/16   Renato Shin, MD  Insulin Syringe-Needle U-100 (INSULIN SYRINGE 1CC/31GX5/16") 31G X 5/16" 1 ML MISC Use to inject insulin 3 per day. 12/14/15   Renato Shin, MD  ketoconazole (NIZORAL) 2 % cream Apply 1 application topically 2 (two) times daily. 04/11/15   Laurey Morale, MD  levothyroxine (SYNTHROID, LEVOTHROID) 150 MCG tablet Take 1 tablet (150 mcg total) by mouth daily. 12/31/16   Laurey Morale, MD  Liraglutide (VICTOZA [place]) Inject into the skin daily.    [provider]  meclizine (ANTIVERT) 50 MG tablet Take 0.5 tablets (25 mg total) by mouth 3 (three) times daily as needed. 08/31/14   Al Corpus, PA-C  temazepam (RESTORIL) 30 MG capsule TAKE ONE CAPSULE BY MOUTH EVERY NIGHT AT BEDTIME AS NEEDED 06/22/17   Laurey Morale, MD    Family History Family History  Problem Relation Age of Onset  . Asthma Unknown        fhx  . Depression Unknown        fhx  . Diabetes Unknown        fhx  . Parkinsonism Unknown        fhx  . Lupus Unknown        fhx  . Alzheimer's disease Father        Deceased, 68  . Diabetes Mellitus II Father   . Arthritis/Rheumatoid Mother        Living, 73  . Diabetes Mellitus II Sister   . Diabetes Mellitus II Brother     Social History Social History  Substance Use Topics  . Smoking status: Former Smoker    Packs/day: 1.00    Years: 35.00    Types: Cigarettes    Quit date: 12/01/2008  . Smokeless tobacco: Never Used  . Alcohol use No     Allergies   Codeine; Fluoxetine hcl; Hydrocodone-acetaminophen; Oxycodone-acetaminophen; Penicillins; and Tramadol  hcl   Review of Systems Review of Systems  Constitutional: Negative for fever.  Respiratory: Negative for shortness of breath.   Cardiovascular: Negative for chest pain and palpitations.  Gastrointestinal: Positive for abdominal pain, constipation, diarrhea and nausea. Negative for vomiting.  Skin: Negative for color change and wound.  All other systems reviewed and are negative.    Physical Exam Updated Vital Signs BP (!) 150/71   Pulse 84   Temp 98.5 F (36.9 C) (Oral)   Resp 20  Ht 5' (1.524 m)   Wt 240 lb (108.9 kg)   SpO2 93%   BMI 46.87 kg/m   Physical Exam  Constitutional: She is oriented to person, place, and time. She appears well-developed and well-nourished.  HENT:  Head: Normocephalic and atraumatic.  Eyes: Pupils are equal, round, and reactive to light. EOM are normal.  Neck: Normal range of motion. Neck supple. No JVD present.  Cardiovascular: Normal rate, regular rhythm and normal heart sounds.   No murmur heard. Pulmonary/Chest: Effort normal and breath sounds normal. She has no wheezes. She has no rales. She exhibits no tenderness.  Abdominal: Soft. She exhibits no distension and no mass. Bowel sounds are decreased. There is tenderness in the right upper quadrant. There is no rebound and no guarding.  Moderate tenderness RUQ.   Musculoskeletal: Normal range of motion. She exhibits no edema.  Lymphadenopathy:    She has no cervical adenopathy.  Neurological: She is alert and oriented to person, place, and time. No cranial nerve deficit. She exhibits normal muscle tone. Coordination normal.  Skin: Skin is warm and dry. No rash noted.  Psychiatric: She has a normal mood and affect. Her behavior is normal. Judgment and thought content normal.  Nursing note and vitals reviewed.    ED Treatments / Results  DIAGNOSTIC STUDIES: Oxygen Saturation is 93% on RA, low by my interpretation.    COORDINATION OF CARE: 4:06 AM-Discussed next steps with pt. Pt  verbalized understanding and is agreeable with the plan. Will order medications and imaging.   Labs (all labs ordered are listed, but only abnormal results are displayed) Labs Reviewed  COMPREHENSIVE METABOLIC PANEL - Abnormal; Notable for the following:       Result Value   Chloride 98 (*)    Glucose, Bld 237 (*)    AST 101 (*)    ALT 94 (*)    Alkaline Phosphatase 128 (*)    All other components within normal limits  CBC - Abnormal; Notable for the following:    WBC 12.4 (*)    All other components within normal limits  URINALYSIS, ROUTINE W REFLEX MICROSCOPIC - Abnormal; Notable for the following:    APPearance HAZY (*)    Leukocytes, UA LARGE (*)    Squamous Epithelial / LPF 0-5 (*)    All other components within normal limits  DIFFERENTIAL - Abnormal; Notable for the following:    Neutro Abs 8.6 (*)    All other components within normal limits  LIPASE, BLOOD  TROPONIN I    EKG  EKG Interpretation  Date/Time:  Monday June 29 2017 04:26:29 EDT Ventricular Rate:  83 PR Interval:    QRS Duration: 76 QT Interval:  380 QTC Calculation: 447 R Axis:   19 Text Interpretation:  Sinus rhythm Anterior infarct, old No old tracing to compare Confirmed by Delora Fuel ([id number]) on 06/29/2017 5:17:03 AM       Radiology Ct Abdomen Pelvis W Contrast  Result Date: 06/29/2017 CLINICAL DATA:  61 y/o  F; right-sided abdominal pain. EXAM: CT ABDOMEN AND PELVIS WITH CONTRAST TECHNIQUE: Multidetector CT imaging of the abdomen and pelvis was performed using the standard protocol following bolus administration of intravenous contrast. CONTRAST:  19mL ISOVUE-300 IOPAMIDOL (ISOVUE-300) INJECTION 61% COMPARISON:  06/17/2017 abdominal ultrasound. 12/06/2013 lumbar spine radiographs. FINDINGS: Lower chest: No acute abnormality. Hepatobiliary: Hepatic steatosis. Right lobe of liver measures 27.8 cm craniocaudal. No focal liver abnormality is seen. No gallstones, gallbladder wall thickening, or  biliary dilatation. Pancreas: Unremarkable. No  pancreatic ductal dilatation or surrounding inflammatory changes. Spleen: Spleen measures 12.9 x 5.9 x 13.5 cm (volume = 540 cm^3). Adrenals/Urinary Tract: Adrenal glands are unremarkable. Right kidney lower pole cyst measuring 3.6 cm. Kidneys are otherwise normal, without renal calculi, focal lesion, or hydronephrosis. Bladder is unremarkable. Stomach/Bowel: No obstructive or inflammatory changes of the bowel identified. Normal appendix. Normal stomach. Fecalization of stool contents within the distal ileum. Vascular/Lymphatic: Aortic atherosclerosis. No enlarged abdominal or pelvic lymph nodes. Reproductive: Uterus and bilateral adnexa are unremarkable. Other: No abdominal wall hernia or abnormality. No abdominopelvic ascites. Musculoskeletal: T11 40% anterior compression deformity, progressed from 2015. Lumbar degenerative changes with prominent L4-5 and L5-S1 facet arthropathy. IMPRESSION: 1. Hepatosplenomegaly. Spleen volume is decreased from prior abdominal ultrasound. 2. Hepatic steatosis. 3. Fecalization of stool contents in the distal ileum, possibly related to chronic dysmotility. 4. T11 40% anterior compression deformity, progressed from 2015. Electronically Signed   By: Kristine Garbe M.D.   On: 06/29/2017 05:57    Procedures Procedures (including critical care time)  Medications Ordered in ED Medications  ondansetron (ZOFRAN) injection 4 mg (4 mg Intravenous Given 06/29/17 0417)  morphine 4 MG/ML injection 4 mg (4 mg Intravenous Given 06/29/17 0417)  iopamidol (ISOVUE-300) 61 % injection (100 mLs  Contrast Given 06/29/17 0534)     Initial Impression / Assessment and Plan / ED Course  I have reviewed the triage vital signs and the nursing notes.  Pertinent labs & imaging results that were available during my care of the patient were reviewed by me and considered in my medical decision making (see chart for details).  Right upper  quadrant pain with relatively benign exam. Old records are reviewed, and she has no prior ED visits for similar pain. She is status post cholecystectomy. Recent abdominal ultrasound did show splenomegaly. She is given morphine and ondansetron and sent for CT of abdomen and pelvis which showed decrease in spleen size, but no explanation for her to seek pain. Laboratory workup showed elevation of transaminases and alkaline phosphatase 2 levels that she has had in the past, but an increase when compared with labs drawn on March 6 of this year. She had excellent relief of pain with above noted treatment. She is discharged with prescription for a small number of hydromorphone tablets as well as prescription for ondansetron, referred to PCP to evaluate cause of pain, and follow abnormal liver function tests.  Final Clinical Impressions(s) / ED Diagnoses   Final diagnoses:  RUQ pain  Elevated liver function tests    New Prescriptions New Prescriptions   HYDROMORPHONE (DILAUDID) 2 MG TABLET    Take 0.5-1 tablets (1-2 mg total) by mouth every 4 (four) hours as needed for severe pain.   ONDANSETRON (ZOFRAN) 4 MG TABLET    Take 1 tablet (4 mg total) by mouth every 6 (six) hours.   I personally performed the services described in this documentation, which was scribed in my presence. The recorded information has been reviewed and is accurate.       Delora Fuel, MD 41/96/22 [phone]

## 2017-06-29 NOTE — ED Provider Notes (Signed)
Pt d/c by Dr. Roxanne Mins with a rx for oral dilaudid.  When she got to the pharmacy, she was told that she is allergic to dilaudid and they would not give her the rx.  She came back here.  I gave her a rx for ms contin 15 mg #10.  I had to do it on a paper rx as pt left more than 2 hours ago.   Isla Pence, MD 06/29/17 1157

## 2017-06-29 NOTE — ED Notes (Signed)
Patient transported to CT 

## 2017-06-29 NOTE — Discharge Instructions (Signed)
Your CT scan did not show a clear reason for your pain. Your blood work showed a slight elevation os some liver enzymes (AST, ALT, Alkaline phosphatase). Follow up with your doctor to evaluate these. Return if symptoms are getting worse.

## 2017-07-29 ENCOUNTER — Telehealth: Payer: Self-pay | Admitting: Family Medicine

## 2017-07-29 MED ORDER — HYDROXYZINE PAMOATE 25 MG PO CAPS: ORAL_CAPSULE | ORAL | 3 refills | Status: DC

## 2017-07-29 NOTE — Telephone Encounter (Signed)
Refill request for Hydroxyzine 25 mg take 1 po tid prn and send to [facility], it is now in stock.

## 2017-07-29 NOTE — Telephone Encounter (Signed)
Pt requesting 3 month supply, per Dr. Sarajane Jews okay to fill. I printed scripts and faxed to [facility].

## 2017-07-29 NOTE — Telephone Encounter (Signed)
Can we refill this? 

## 2017-07-29 NOTE — Telephone Encounter (Signed)
Call in #90 with 11 rf 

## 2017-08-19 ENCOUNTER — Other Ambulatory Visit: Payer: Self-pay | Admitting: Family Medicine

## 2017-08-19 NOTE — Telephone Encounter (Signed)
Pt's free brand Synthroid has arrived, please send new RX to [facility].

## 2017-08-20 ENCOUNTER — Encounter: Payer: Self-pay | Admitting: Family Medicine

## 2017-08-21 NOTE — Telephone Encounter (Signed)
I spoke with pt and she will contact Dr. Ainsley Spinner office to take care of this, he increased dosage on this.

## 2017-08-24 ENCOUNTER — Encounter: Payer: Self-pay | Admitting: Family Medicine

## 2017-08-25 ENCOUNTER — Other Ambulatory Visit: Payer: Self-pay | Admitting: Family Medicine

## 2017-08-25 MED ORDER — LEVOTHYROXINE SODIUM 175 MCG PO TABS: 175.0000 ug | ORAL_TABLET | Freq: Every day | ORAL | 0 refills | Status: DC

## 2017-08-25 NOTE — Telephone Encounter (Signed)
I sent pt a mychart message.

## 2017-08-25 NOTE — Telephone Encounter (Signed)
Request for Synthroid and it was called in to [facility].

## 2017-08-25 NOTE — Telephone Encounter (Signed)
rx is ready for pick up 

## 2017-09-28 ENCOUNTER — Encounter: Payer: Self-pay | Admitting: Family Medicine

## 2017-09-29 ENCOUNTER — Other Ambulatory Visit: Payer: Self-pay | Admitting: Family Medicine

## 2017-09-29 NOTE — Telephone Encounter (Signed)
Refill request for Dexilant 60 mg a 90 day supply to [facility]. ( order stated once a day, however our records showed bid )

## 2017-09-30 ENCOUNTER — Telehealth: Payer: Self-pay | Admitting: Family Medicine

## 2017-09-30 ENCOUNTER — Encounter: Payer: Self-pay | Admitting: Family Medicine

## 2017-09-30 NOTE — Telephone Encounter (Signed)
Duplicate, see previous note 

## 2017-09-30 NOTE — Telephone Encounter (Signed)
° °  Pt said she is out of the med and she takes once a day   Pt request refill of the following:  dexlansoprazole (DEXILANT) 60 MG capsule   Phamacy: [facility]

## 2017-10-01 MED ORDER — DEXLANSOPRAZOLE 60 MG PO CPDR: 60.0000 mg | DELAYED_RELEASE_CAPSULE | Freq: Once | ORAL | 3 refills | Status: DC

## 2017-10-01 NOTE — Telephone Encounter (Signed)
To take daily, call in #90 with 3 rf

## 2017-10-01 NOTE — Telephone Encounter (Signed)
Duplicate, see previous note 

## 2017-10-01 NOTE — Telephone Encounter (Signed)
I called in script to below pharmacy and spoke with pt.

## 2017-10-01 NOTE — Telephone Encounter (Signed)
Pt states she is leaving work early and would like to know if you will call in today as she is out.

## 2017-10-08 ENCOUNTER — Encounter: Payer: Self-pay | Admitting: Endocrinology

## 2017-11-12 ENCOUNTER — Encounter: Payer: Self-pay | Admitting: Endocrinology

## 2017-11-12 ENCOUNTER — Ambulatory Visit: Payer: Managed Care, Other (non HMO) | Admitting: Endocrinology

## 2017-11-12 VITALS — BP 144/72 | HR 77 | Wt 241.8 lb

## 2017-11-12 DIAGNOSIS — Z794 Long term (current) use of insulin: Secondary | ICD-10-CM | POA: Diagnosis not present

## 2017-11-12 DIAGNOSIS — E119 Type 2 diabetes mellitus without complications: Secondary | ICD-10-CM | POA: Diagnosis not present

## 2017-11-12 LAB — POCT GLYCOSYLATED HEMOGLOBIN (HGB A1C): Hemoglobin A1C: 9.8

## 2017-11-12 MED ORDER — INSULIN LISPRO 100 UNIT/ML (KWIKPEN): PEN_INJECTOR | SUBCUTANEOUS | 2 refills | Status: DC

## 2017-11-12 MED ORDER — INSULIN GLARGINE 300 UNIT/ML ~~LOC~~ SOPN: 240.0000 [IU] | PEN_INJECTOR | SUBCUTANEOUS | 3 refills | Status: DC

## 2017-11-12 NOTE — Patient Instructions (Addendum)
Please change the insulin to the numbers listed below. Please call or message [name] next week, to tell us how the blood sugar is doing.   check your blood sugar twice a day.  vary the time of day when you check, between before the 3 meals, and at bedtime.  also check if you have symptoms of your blood sugar being too high or too low.  please keep a record of the readings and bring it to your next appointment here (or you can bring the meter itself).  You can write it on any piece of paper.  please call us sooner if your blood sugar goes below 70, or if you have a lot of readings over 200. Please come back for a follow-up appointment in 1 month.

## 2017-11-12 NOTE — Progress Notes (Signed)
Subjective:    Patient ID: Bonnie Gonzalez, female    DOB: Dec 16, 1955, 61 y.o.   MRN: [id number]  HPI Pt returns for f/u of diabetes mellitus: DM type: Insulin-requiring type 2 Dx'ed: 7001 Complications: polyneuropathy Therapy: insulin since 2012, and victoza.   GDM: never DKA: never Severe hypoglycemia: never. Pancreatitis: never Other: she takes multiple daily injections; she cannot afford weight-loss surgery.  Interval history: She takes toujeo 260/d, and BID humalog (20 units with breakfast, and 120 units with supper).  no cbg record, but states cbg's vary from 110-400.  It is in general higher as the day goes on.  She misses the insulin appprox twice per month.  She says she is too busy to take insulin at lunch.   Past Medical History:  Diagnosis Date  . Allergy   . Anxiety   . Depression   . Diabetes mellitus   . GERD (gastroesophageal reflux disease)   . IBS (irritable bowel syndrome)   . Insomnia   . Tuberculosis    hydrothyroidism    Past Surgical History:  Procedure Laterality Date  . benign tumor      removed from groin  . WRIST ARTHROCENTESIS N/A 2001    Social History   Socioeconomic History  . Marital status: Divorced    Spouse name: Not on file  . Number of children: Not on file  . Years of education: Not on file  . Highest education level: Not on file  Social Needs  . Financial resource strain: Not on file  . Food insecurity - worry: Not on file  . Food insecurity - inability: Not on file  . Transportation needs - medical: Not on file  . Transportation needs - non-medical: Not on file  Occupational History  . Not on file  Tobacco Use  . Smoking status: Former Smoker    Packs/day: 1.00    Years: 35.00    Pack years: 35.00    Types: Cigarettes    Last attempt to quit: 12/01/2008    Years since quitting: 8.9  . Smokeless tobacco: Never Used  Substance and Sexual Activity  . Alcohol use: No    Alcohol/week: 0.0 oz  . Drug use: No  . Sexual  activity: Not on file  Other Topics Concern  . Not on file  Social History Narrative   Lives alone.   She has four grown children.   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   Highest level of education:  1.5 years of college    Current Outpatient Medications on File Prior to Visit  Medication Sig Dispense Refill  . albuterol (PROVENTIL HFA;VENTOLIN HFA) 108 (90 BASE) MCG/ACT inhaler Inhale 2 puffs into the lungs every 4 (four) hours as needed for wheezing or shortness of breath. 1 Inhaler 5  . alendronate (FOSAMAX) 70 MG tablet TAKE 1 TABLET EVERY 7 DAYS. TAKE WITH A FULL GLASS OF WATER ON AN EMPTY STOMACH. 4 tablet 11  . ALPRAZolam (XANAX) 0.5 MG tablet TAKE 1 TABLET BY MOUTH TWICE A DAY AS NEEDED 60 tablet 5  . atorvastatin (LIPITOR) 40 MG tablet Take 1 tablet (40 mg total) by mouth daily. 90 tablet 3  . Cholecalciferol (VITAMIN D) 2000 units tablet Take 2,000 Units by mouth 2 (two) times daily.    Marland Kitchen dexlansoprazole (DEXILANT) 60 MG capsule Take 1 capsule (60 mg total) by mouth once. 90 capsule 3  . DULoxetine (CYMBALTA) 30 MG capsule Take 1 capsule (30 mg total)  by mouth daily. 90 capsule 3  . HYDROmorphone (DILAUDID) 2 MG tablet Take 0.5-1 tablets (1-2 mg total) by mouth every 4 (four) hours as needed for severe pain. 10 tablet 0  . hydrOXYzine (VISTARIL) 25 MG capsule TAKE 1 CAPSULE 3 TIMES     DAILY AS NEEDED 270 capsule 3  . Insulin Syringe-Needle U-100 (INSULIN SYRINGE 1CC/31GX5/16") 31G X 5/16" 1 ML MISC Use to inject insulin 3 per day. 100 each 1  . levothyroxine (SYNTHROID, LEVOTHROID) 175 MCG tablet Take 1 tablet (175 mcg total) by mouth daily before breakfast. 90 tablet 0  . liraglutide (VICTOZA) 18 MG/3ML SOPN Inject 1.8 mg into the skin daily.    . meclizine (ANTIVERT) 50 MG tablet Take 0.5 tablets (25 mg total) by mouth 3 (three) times daily as needed. 30 tablet 0  . ondansetron (ZOFRAN) 4 MG tablet Take 1 tablet (4 mg total) by mouth every 6 (six)  hours. 12 tablet 0  . temazepam (RESTORIL) 30 MG capsule TAKE ONE CAPSULE BY MOUTH EVERY NIGHT AT BEDTIME AS NEEDED 30 capsule 5   No current facility-administered medications on file prior to visit.     Allergies  Allergen Reactions  . Codeine Hives  . Fluoxetine Hcl Other (See Comments)    Hair itching  . Hydrocodone-Acetaminophen Hives  . Oxycodone-Acetaminophen Hives  . Penicillins Hives  . Tramadol Hcl Other (See Comments)    unknown    Family History  Problem Relation Age of Onset  . Asthma Unknown        fhx  . Depression Unknown        fhx  . Diabetes Unknown        fhx  . Parkinsonism Unknown        fhx  . Lupus Unknown        fhx  . Alzheimer's disease Father        Deceased, 70  . Diabetes Mellitus II Father   . Arthritis/Rheumatoid Mother        Living, 57  . Diabetes Mellitus II Sister   . Diabetes Mellitus II Brother     BP (!) 144/72 (BP Location: Left Arm, Patient Position: Sitting, Cuff Size: Normal)   Pulse 77   Wt 241 lb 12.8 oz (109.7 kg)   SpO2 95%   BMI 47.22 kg/m    Review of Systems She denies hypoglycemia.     Objective:   Physical Exam VITAL SIGNS:  See vs page GENERAL: no distress Pulses: foot pulses are intact bilaterally.   MSK: no deformity of the feet or ankles.  CV: trace bilat edema of the legs Skin:  no ulcer on the feet or ankles.  normal color and temp on the feet and ankles.  Neuro: sensation is intact to touch on the feet and ankles, but decreased from normal.    A1c=9.8%     Assessment & Plan:  Insulin-requiring type 2 DM, with polyneuropathy: Worse.  She declines continuous glucose monitor, at least for now.   Noncompliance with cbg recording and insulin doses.  We discussed.    Patient Instructions  Please change the insulin to the numbers listed below. Please call or message [name] next week, to tell us how the blood sugar is doing.   check your blood sugar twice a day.  vary the time of day when you check,  between before the 3 meals, and at bedtime.  also check if you have symptoms of your blood sugar being too high or  too low.  please keep a record of the readings and bring it to your next appointment here (or you can bring the meter itself).  You can write it on any piece of paper.  please call us sooner if your blood sugar goes below 70, or if you have a lot of readings over 200. Please come back for a follow-up appointment in 1 month.

## 2017-11-17 ENCOUNTER — Other Ambulatory Visit: Payer: Self-pay

## 2017-11-25 ENCOUNTER — Other Ambulatory Visit: Payer: Self-pay

## 2017-11-25 ENCOUNTER — Other Ambulatory Visit: Payer: Self-pay | Admitting: Family Medicine

## 2017-11-25 MED ORDER — BASAGLAR KWIKPEN 100 UNIT/ML ~~LOC~~ SOPN: PEN_INJECTOR | SUBCUTANEOUS | 11 refills | Status: DC

## 2017-11-26 NOTE — Telephone Encounter (Signed)
Last OV 06/05/2017. Fosamax was last refilled 10/10/2016 disp 4 with 11 refills. Vistaril was last refilled 07/29/2017 disp 270 with 3 refills. Sent to PCP for approval.

## 2017-11-27 ENCOUNTER — Other Ambulatory Visit: Payer: Self-pay

## 2017-11-27 MED ORDER — INSULIN GLARGINE 100 UNITS/ML SOLOSTAR PEN: PEN_INJECTOR | SUBCUTANEOUS | 11 refills | Status: DC

## 2017-11-27 NOTE — Telephone Encounter (Signed)
Pt states she will be out of the  hydrOXYzine (VISTARIL) 25 MG capsule  and states she scratches herself so much without this med. Pt states her pharmacy will be closed Monday too. Hopes to get today.  Please call to advise.  [address] [place] [phone] (Phone) [phone] (Fax)

## 2017-11-27 NOTE — Telephone Encounter (Signed)
Called and spoke with the pt. Pt advised that Dr. Sarajane Jews is out of the office and will not be back until Monday. Pt stated that she would like for her Rx's to go to [facility] be sent in on Monday because they are closed on Tuesday. I called [facility] to double check they are open Monday closed on Tuesday. If unable to refill Rx's on Monday send Rx's to [facility]   Sent to PCP for approval.  Last OV 06/05/2017. Fosamax was last refilled 10/10/2016 disp 4 with 11 refills  Vistaril was last refilled 07/29/2017 disp 270 with 3 refills.

## 2017-12-02 ENCOUNTER — Other Ambulatory Visit: Payer: Self-pay

## 2017-12-02 MED ORDER — BASAGLAR KWIKPEN 100 UNIT/ML ~~LOC~~ SOPN: PEN_INJECTOR | SUBCUTANEOUS | 11 refills | Status: DC

## 2017-12-08 ENCOUNTER — Other Ambulatory Visit: Payer: Self-pay | Admitting: *Deleted

## 2017-12-08 ENCOUNTER — Telehealth: Payer: Self-pay | Admitting: Family Medicine

## 2017-12-08 MED ORDER — LEVOTHYROXINE SODIUM 175 MCG PO TABS: 175.0000 ug | ORAL_TABLET | Freq: Every day | ORAL | 0 refills | Status: DC

## 2017-12-08 NOTE — Telephone Encounter (Signed)
Copied from [place] [id number]. Topic: Quick Communication - See Telephone Encounter >> Dec 08, 2017 12:21 PM Vernona Rieger wrote: CRM for notification. See Telephone encounter for:   12/08/17.  levothyroxine (SYNTHROID, LEVOTHROID) 175 MCG tablet  [place], [place] - [place]

## 2017-12-11 ENCOUNTER — Telehealth: Payer: Self-pay | Admitting: Family Medicine

## 2017-12-11 ENCOUNTER — Telehealth: Payer: Self-pay

## 2017-12-11 MED ORDER — LEVOTHYROXINE SODIUM 175 MCG PO TABS: 175.0000 ug | ORAL_TABLET | Freq: Every day | ORAL | 0 refills | Status: DC

## 2017-12-11 NOTE — Telephone Encounter (Signed)
Prescription sent per protocol

## 2017-12-11 NOTE — Telephone Encounter (Signed)
I called patient to ask what insurance coverage she hd because we keep getting a PA request for basaglar as well as lantus. When I called Christella Scheuermann I was informed that patient no longer had that insurance. I called patient & she now has BCBS & is taking toujeo. She stated that Dr. Carlis Abbott put her on that when she didn't have insurance. Patient is schedule here on Monday & I stated she could discuss with Dr. Loanne Drilling when she comes in which insulin he feels is best for patient to be on & we can submit new prescription.

## 2017-12-14 ENCOUNTER — Encounter: Payer: Self-pay | Admitting: Endocrinology

## 2017-12-14 ENCOUNTER — Ambulatory Visit: Payer: No Typology Code available for payment source | Admitting: Endocrinology

## 2017-12-14 DIAGNOSIS — Z794 Long term (current) use of insulin: Secondary | ICD-10-CM | POA: Diagnosis not present

## 2017-12-14 DIAGNOSIS — Z23 Encounter for immunization: Secondary | ICD-10-CM | POA: Diagnosis not present

## 2017-12-14 DIAGNOSIS — E119 Type 2 diabetes mellitus without complications: Secondary | ICD-10-CM | POA: Diagnosis not present

## 2017-12-14 NOTE — Telephone Encounter (Signed)
Prescription sent to wrong pharmacy

## 2017-12-14 NOTE — Patient Instructions (Addendum)
Please take these amounts of insulin, no matter what the blood sugar is.   Please call or message [name] next week, to tell us how the blood sugar is doing.   check your blood sugar twice a day.  vary the time of day when you check, between before the 3 meals, and at bedtime.  also check if you have symptoms of your blood sugar being too high or too low.  please keep a record of the readings and bring it to your next appointment here (or you can bring the meter itself).  You can write it on any piece of paper.  please call us sooner if your blood sugar goes below 70, or if you have a lot of readings over 200. If this doesn't work, another option is to take "NPH" (once a day insulin that works faster than the lantus).  Please come back for a follow-up appointment in 1 month.

## 2017-12-14 NOTE — Progress Notes (Signed)
Subjective:    Patient ID: Bonnie Gonzalez, female    DOB: 1956-04-02, 62 y.o.   MRN: [id number]  HPI Pt returns for f/u of diabetes mellitus: DM type: Insulin-requiring type 2 Dx'ed: 7124 Complications: polyneuropathy Therapy: insulin since 2012, and victoza.   GDM: never DKA: never Severe hypoglycemia: never. Pancreatitis: never Other: she takes multiple daily injections; she cannot afford weight-loss surgery.  Interval history: she brings a record of her cbg's which I have reviewed today.  It varies from 70-300.  She checks fasting only.  She sometimes skips breakfast insulin, due to relatively low cbg.  pt states she feels well in general.  Past Medical History:  Diagnosis Date  . Allergy   . Anxiety   . Depression   . Diabetes mellitus   . GERD (gastroesophageal reflux disease)   . IBS (irritable bowel syndrome)   . Insomnia   . Tuberculosis    hydrothyroidism    Past Surgical History:  Procedure Laterality Date  . benign tumor      removed from groin  . WRIST ARTHROCENTESIS N/A 2001    Social History   Socioeconomic History  . Marital status: Divorced    Spouse name: Not on file  . Number of children: Not on file  . Years of education: Not on file  . Highest education level: Not on file  Social Needs  . Financial resource strain: Not on file  . Food insecurity - worry: Not on file  . Food insecurity - inability: Not on file  . Transportation needs - medical: Not on file  . Transportation needs - non-medical: Not on file  Occupational History  . Not on file  Tobacco Use  . Smoking status: Former Smoker    Packs/day: 1.00    Years: 35.00    Pack years: 35.00    Types: Cigarettes    Last attempt to quit: 12/01/2008    Years since quitting: 9.0  . Smokeless tobacco: Never Used  Substance and Sexual Activity  . Alcohol use: No    Alcohol/week: 0.0 oz  . Drug use: No  . Sexual activity: Not on file  Other Topics Concern  . Not on file  Social History  Narrative   Lives alone.   She has four grown children.   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   Highest level of education:  1.5 years of college    Current Outpatient Medications on File Prior to Visit  Medication Sig Dispense Refill  . albuterol (PROVENTIL HFA;VENTOLIN HFA) 108 (90 BASE) MCG/ACT inhaler Inhale 2 puffs into the lungs every 4 (four) hours as needed for wheezing or shortness of breath. 1 Inhaler 5  . alendronate (FOSAMAX) 70 MG tablet TAKE 1 TABLET EVERY 7 DAYS. TAKE WITH A FULL GLASS OF WATER ON AN EMPTY STOMACH. 4 tablet 11  . ALPRAZolam (XANAX) 0.5 MG tablet TAKE 1 TABLET BY MOUTH TWICE A DAY AS NEEDED 60 tablet 5  . atorvastatin (LIPITOR) 40 MG tablet Take 1 tablet (40 mg total) by mouth daily. 90 tablet 3  . Cholecalciferol (VITAMIN D) 2000 units tablet Take 2,000 Units by mouth 2 (two) times daily.    . DULoxetine (CYMBALTA) 30 MG capsule Take 1 capsule (30 mg total) by mouth daily. 90 capsule 3  . HYDROmorphone (DILAUDID) 2 MG tablet Take 0.5-1 tablets (1-2 mg total) by mouth every 4 (four) hours as needed for severe pain. 10 tablet 0  . hydrOXYzine (VISTARIL)  25 MG capsule TAKE 1 CAPSULE BY MOUTH THREE TIMES A DAY AS NEEDED 90 capsule 5  . Insulin Glargine (BASAGLAR KWIKPEN) 100 UNIT/ML SOPN Inject 240 units into the skin subcutaneously once daily in the morning. 75 pen 11  . insulin lispro (HUMALOG KWIKPEN) 100 UNIT/ML KiwkPen 40 units with breakfast, and 160 units into skin with supper. 30 pen 2  . Insulin Syringe-Needle U-100 (INSULIN SYRINGE 1CC/31GX5/16") 31G X 5/16" 1 ML MISC Use to inject insulin 3 per day. 100 each 1  . levothyroxine (SYNTHROID, LEVOTHROID) 175 MCG tablet Take 1 tablet (175 mcg total) by mouth daily before breakfast. 90 tablet 0  . liraglutide (VICTOZA) 18 MG/3ML SOPN Inject 1.8 mg into the skin daily.    . meclizine (ANTIVERT) 50 MG tablet Take 0.5 tablets (25 mg total) by mouth 3 (three) times daily as needed. 30  tablet 0  . ondansetron (ZOFRAN) 4 MG tablet Take 1 tablet (4 mg total) by mouth every 6 (six) hours. 12 tablet 0  . temazepam (RESTORIL) 30 MG capsule TAKE ONE CAPSULE BY MOUTH EVERY NIGHT AT BEDTIME AS NEEDED 30 capsule 5  . dexlansoprazole (DEXILANT) 60 MG capsule Take 1 capsule (60 mg total) by mouth once. 90 capsule 3   No current facility-administered medications on file prior to visit.     Allergies  Allergen Reactions  . Codeine Hives  . Fluoxetine Hcl Other (See Comments)    Hair itching  . Hydrocodone-Acetaminophen Hives  . Oxycodone-Acetaminophen Hives  . Penicillins Hives  . Tramadol Hcl Other (See Comments)    unknown    Family History  Problem Relation Age of Onset  . Asthma Unknown        fhx  . Depression Unknown        fhx  . Diabetes Unknown        fhx  . Parkinsonism Unknown        fhx  . Lupus Unknown        fhx  . Alzheimer's disease Father        Deceased, 49  . Diabetes Mellitus II Father   . Arthritis/Rheumatoid Mother        Living, 26  . Diabetes Mellitus II Sister   . Diabetes Mellitus II Brother     BP (!) 160/72 (BP Location: Left Arm, Patient Position: Sitting, Cuff Size: Normal)   Pulse 82   Wt 242 lb 6.4 oz (110 kg)   SpO2 94%   BMI 47.34 kg/m    Review of Systems She denies hypoglycemia.      Objective:   Physical Exam VITAL SIGNS:  See vs page GENERAL: no distress Pulses: foot pulses are intact bilaterally.   MSK: no deformity of the feet or ankles.  CV: trace bilat edema of the legs Skin:  no ulcer on the feet or ankles.  normal color and temp on the feet and ankles.  Neuro: sensation is intact to touch on the feet and ankles, but decreased from normal.        Assessment & Plan:  Insulin-requiring type 2 DM, with polyneuropathy: ongoing poor glycemic control, and noncompliance with cbg recording: She may need a simpler regimen.   Patient Instructions  Please take these amounts of insulin, no matter what the blood  sugar is.   Please call or message [name] next week, to tell us how the blood sugar is doing.   check your blood sugar twice a day.  vary the time of day when  you check, between before the 3 meals, and at bedtime.  also check if you have symptoms of your blood sugar being too high or too low.  please keep a record of the readings and bring it to your next appointment here (or you can bring the meter itself).  You can write it on any piece of paper.  please call us sooner if your blood sugar goes below 70, or if you have a lot of readings over 200. If this doesn't work, another option is to take "NPH" (once a day insulin that works faster than the lantus).  Please come back for a follow-up appointment in 1 month.

## 2018-01-11 ENCOUNTER — Ambulatory Visit: Payer: BLUE CROSS/BLUE SHIELD | Admitting: Endocrinology

## 2018-01-11 ENCOUNTER — Encounter: Payer: Self-pay | Admitting: Endocrinology

## 2018-01-11 VITALS — BP 148/74 | HR 73 | Ht 60.0 in | Wt 245.0 lb

## 2018-01-11 DIAGNOSIS — E119 Type 2 diabetes mellitus without complications: Secondary | ICD-10-CM

## 2018-01-11 DIAGNOSIS — Z794 Long term (current) use of insulin: Secondary | ICD-10-CM | POA: Diagnosis not present

## 2018-01-11 LAB — POCT GLYCOSYLATED HEMOGLOBIN (HGB A1C): Hemoglobin A1C: 9.5

## 2018-01-11 MED ORDER — INSULIN NPH (HUMAN) (ISOPHANE) 100 UNIT/ML ~~LOC~~ SUSP: 300.0000 [IU] | SUBCUTANEOUS | 11 refills | Status: DC

## 2018-01-11 MED ORDER — LIRAGLUTIDE 18 MG/3ML ~~LOC~~ SOPN: 1.8000 mg | PEN_INJECTOR | Freq: Every day | SUBCUTANEOUS | 11 refills | Status: DC

## 2018-01-11 MED ORDER — "INSULIN SYRINGE 31G X 5/16"" 1 ML MISC": 1 refills | Status: DC

## 2018-01-11 NOTE — Patient Instructions (Addendum)
Please change both current insulins to "NPH,"  100 units each morning, and: Please continue the same victoza On this type of insulin schedule, you should eat meals on a regular schedule (especially lunch).  If a meal is missed or significantly delayed, your blood sugar could go low. This is probably not enough insulin, so please call or message [name] next week, to tell us how the blood sugar is doing.   check your blood sugar twice a day.  vary the time of day when you check, between before the 3 meals, and at bedtime.  also check if you have symptoms of your blood sugar being too high or too low.  please keep a record of the readings and bring it to your next appointment here (or you can bring the meter itself).  You can write it on any piece of paper.  please call us sooner if your blood sugar goes below 70, or if you have a lot of readings over 200. Please come back for a follow-up appointment in 2 months.

## 2018-01-11 NOTE — Progress Notes (Signed)
Subjective:    Patient ID: Bonnie Gonzalez, female    DOB: [birth year], 62 y.o.   MRN: [id number]  HPI Pt returns for f/u of diabetes mellitus: DM type: Insulin-requiring type 2 Dx'ed: 9924 Complications: polyneuropathy Therapy: insulin since 2012, and victoza.   GDM: never DKA: never Severe hypoglycemia: never. Pancreatitis: never Other: she takes 2 qd insulins; she cannot afford weight-loss surgery.  Interval history: She takes toujeo 240 units qam, and humalog 160 units with supper.  she brings a record of her cbg's which I have reviewed today.  It varies from 70-300.  It is in general higher as the day goes on, but not necessarily so  Past Medical History:  Diagnosis Date  . Allergy   . Anxiety   . Depression   . Diabetes mellitus   . GERD (gastroesophageal reflux disease)   . IBS (irritable bowel syndrome)   . Insomnia   . Tuberculosis    hydrothyroidism    Past Surgical History:  Procedure Laterality Date  . benign tumor      removed from groin  . WRIST ARTHROCENTESIS N/A 2001    Social History   Socioeconomic History  . Marital status: Divorced    Spouse name: Not on file  . Number of children: Not on file  . Years of education: Not on file  . Highest education level: Not on file  Social Needs  . Financial resource strain: Not on file  . Food insecurity - worry: Not on file  . Food insecurity - inability: Not on file  . Transportation needs - medical: Not on file  . Transportation needs - non-medical: Not on file  Occupational History  . Not on file  Tobacco Use  . Smoking status: Former Smoker    Packs/day: 1.00    Years: 35.00    Pack years: 35.00    Types: Cigarettes    Last attempt to quit: 12/01/2008    Years since quitting: 9.1  . Smokeless tobacco: Never Used  Substance and Sexual Activity  . Alcohol use: No    Alcohol/week: 0.0 oz  . Drug use: No  . Sexual activity: Not on file  Other Topics Concern  . Not on file  Social History  Narrative   Lives alone.   She has four grown children.   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   Highest level of education:  1.5 years of college    Current Outpatient Medications on File Prior to Visit  Medication Sig Dispense Refill  . albuterol (PROVENTIL HFA;VENTOLIN HFA) 108 (90 BASE) MCG/ACT inhaler Inhale 2 puffs into the lungs every 4 (four) hours as needed for wheezing or shortness of breath. 1 Inhaler 5  . alendronate (FOSAMAX) 70 MG tablet TAKE 1 TABLET EVERY 7 DAYS. TAKE WITH A FULL GLASS OF WATER ON AN EMPTY STOMACH. 4 tablet 11  . ALPRAZolam (XANAX) 0.5 MG tablet TAKE 1 TABLET BY MOUTH TWICE A DAY AS NEEDED 60 tablet 5  . atorvastatin (LIPITOR) 40 MG tablet Take 1 tablet (40 mg total) by mouth daily. 90 tablet 3  . Cholecalciferol (VITAMIN D) 2000 units tablet Take 2,000 Units by mouth 2 (two) times daily.    . DULoxetine (CYMBALTA) 30 MG capsule Take 1 capsule (30 mg total) by mouth daily. 90 capsule 3  . hydrOXYzine (VISTARIL) 25 MG capsule TAKE 1 CAPSULE BY MOUTH THREE TIMES A DAY AS NEEDED 90 capsule 5  . levothyroxine (SYNTHROID, LEVOTHROID) 175  MCG tablet Take 1 tablet (175 mcg total) by mouth daily before breakfast. 90 tablet 0  . meclizine (ANTIVERT) 50 MG tablet Take 0.5 tablets (25 mg total) by mouth 3 (three) times daily as needed. 30 tablet 0  . ondansetron (ZOFRAN) 4 MG tablet Take 1 tablet (4 mg total) by mouth every 6 (six) hours. 12 tablet 0  . temazepam (RESTORIL) 30 MG capsule TAKE ONE CAPSULE BY MOUTH EVERY NIGHT AT BEDTIME AS NEEDED 30 capsule 5  . dexlansoprazole (DEXILANT) 60 MG capsule Take 1 capsule (60 mg total) by mouth once. 90 capsule 3   No current facility-administered medications on file prior to visit.     Allergies  Allergen Reactions  . Codeine Hives  . Fluoxetine Hcl Other (See Comments)    Hair itching  . Hydrocodone-Acetaminophen Hives  . Oxycodone-Acetaminophen Hives  . Penicillins Hives  . Tramadol Hcl  Other (See Comments)    unknown    Family History  Problem Relation Age of Onset  . Asthma Unknown        fhx  . Depression Unknown        fhx  . Diabetes Unknown        fhx  . Parkinsonism Unknown        fhx  . Lupus Unknown        fhx  . Alzheimer's disease Father        Deceased, 56  . Diabetes Mellitus II Father   . Arthritis/Rheumatoid Mother        Living, 39  . Diabetes Mellitus II Sister   . Diabetes Mellitus II Brother     BP (!) 148/74   Pulse 73   Ht 5' (1.524 m)   Wt 245 lb (111.1 kg)   SpO2 93%   BMI 47.85 kg/m   Review of Systems She denies hypoglycemia.      Objective:   Physical Exam VITAL SIGNS:  See vs page GENERAL: no distress Pulses: foot pulses are intact bilaterally.   MSK: no deformity of the feet or ankles.  CV: trace bilat edema of the legs Skin:  no ulcer on the feet or ankles.  normal color and temp on the feet and ankles.  Neuro: sensation is intact to touch on the feet and ankles, but decreased from normal.     Lab Results  Component Value Date   HGBA1C 9.5 01/11/2018      Assessment & Plan:  Insulin-requiring type 2 DM, with polyneuropathy: ongoing poor glycemic control.  Even with the change noted below, she may still need some humalog with supper.    Patient Instructions  Please change both current insulins to "NPH,"  100 units each morning, and: Please continue the same victoza On this type of insulin schedule, you should eat meals on a regular schedule (especially lunch).  If a meal is missed or significantly delayed, your blood sugar could go low. This is probably not enough insulin, so please call or message [name] next week, to tell us how the blood sugar is doing.   check your blood sugar twice a day.  vary the time of day when you check, between before the 3 meals, and at bedtime.  also check if you have symptoms of your blood sugar being too high or too low.  please keep a record of the readings and bring it to your next  appointment here (or you can bring the meter itself).  You can write it on any piece  of paper.  please call us sooner if your blood sugar goes below 70, or if you have a lot of readings over 200. Please come back for a follow-up appointment in 2 months.

## 2018-01-14 ENCOUNTER — Other Ambulatory Visit: Payer: Self-pay | Admitting: Family Medicine

## 2018-01-19 ENCOUNTER — Ambulatory Visit: Payer: BLUE CROSS/BLUE SHIELD | Admitting: Family Medicine

## 2018-01-19 ENCOUNTER — Encounter: Payer: Self-pay | Admitting: Family Medicine

## 2018-01-19 ENCOUNTER — Telehealth: Payer: Self-pay | Admitting: Family Medicine

## 2018-01-19 VITALS — BP 144/78 | HR 84 | Temp 98.7°F | Wt 244.8 lb

## 2018-01-19 DIAGNOSIS — K219 Gastro-esophageal reflux disease without esophagitis: Secondary | ICD-10-CM

## 2018-01-19 DIAGNOSIS — L301 Dyshidrosis [pompholyx]: Secondary | ICD-10-CM

## 2018-01-19 DIAGNOSIS — F418 Other specified anxiety disorders: Secondary | ICD-10-CM

## 2018-01-19 DIAGNOSIS — Z794 Long term (current) use of insulin: Secondary | ICD-10-CM

## 2018-01-19 DIAGNOSIS — E119 Type 2 diabetes mellitus without complications: Secondary | ICD-10-CM

## 2018-01-19 MED ORDER — ATORVASTATIN CALCIUM 40 MG PO TABS: 40.0000 mg | ORAL_TABLET | Freq: Every day | ORAL | 3 refills | Status: DC

## 2018-01-19 MED ORDER — DULOXETINE HCL 60 MG PO CPEP: 60.0000 mg | ORAL_CAPSULE | Freq: Every day | ORAL | 3 refills | Status: AC

## 2018-01-19 MED ORDER — ALPRAZOLAM 0.5 MG PO TABS: 0.5000 mg | ORAL_TABLET | Freq: Two times a day (BID) | ORAL | 5 refills | Status: DC | PRN

## 2018-01-19 MED ORDER — HALOBETASOL PROPIONATE 0.05 % EX CREA: TOPICAL_CREAM | Freq: Two times a day (BID) | CUTANEOUS | 5 refills | Status: DC

## 2018-01-19 MED ORDER — TEMAZEPAM 30 MG PO CAPS: ORAL_CAPSULE | ORAL | 5 refills | Status: DC

## 2018-01-19 NOTE — Telephone Encounter (Signed)
Copied from [place]. Topic: Quick Communication - Rx Refill/Question >> Jan 19, 2018 10:23 AM Robina Ade, Helene Kelp D wrote: Medication:   ALPRAZolam 0.5 MG TAKE 1 TABLET BY MOUTH TWICE A DAY AS NEEDED    Temazepam 30 MG TAKE ONE CAPSULE BY MOUTH EVERY NIGHT AT BEDTIME AS NEEDED    Halobetasol Propionate 0.05 % APPLY TOPICALLY 2 (TWO) TIMES DAILY.     Has the patient contacted their pharmacy? Yes, please call patient when this is done. She needs to leave early from work to make it to the pharmacy.   (Agent: If no, request that the patient contact the pharmacy for the refill.) Medication requested on 01/14/18   Preferred Pharmacy (with phone number or street name):[facility], [place]   Agent: Please be advised that RX refills may take up to 3 business days. We ask that you follow-up with your pharmacy.

## 2018-01-19 NOTE — Telephone Encounter (Signed)
Copied from [place]. Topic: Quick Communication - See Telephone Encounter >> Jan 19, 2018  4:27 PM Antonieta Iba C wrote: CRM for notification. See Telephone encounter for: Pt called in because she was told by pharmacy that medication DULoxetine is requiring a PA. Pt is very impatient and was very rude with her request. Insisting that I place her on hold and contact office to handle this PA right now. Pt says that she was told by the provider that when ever she need him to have his nurse to pull him out of the room with a pt to assist her. I explained to the pt the office process pt responded that she dont care she just want someone to fix it NOW.    Please assist pt further, pt would also like a call back   01/19/18.

## 2018-01-19 NOTE — Progress Notes (Signed)
   Subjective:    Patient ID: Bonnie Gonzalez, female    DOB: 1956-09-09, 62 y.o.   MRN: [id number]  HPI Here for medication review. She is doing well in general except her depression and anxiety have been a problem for her. She feels sad at times and becomes irritable. Sometimes this affects her sleep.    Review of Systems  Constitutional: Negative.   Cardiovascular: Negative.   Neurological: Negative.   Psychiatric/Behavioral: Positive for dysphoric mood and sleep disturbance. The patient is nervous/anxious.        Objective:   Physical Exam  Constitutional: She is oriented to person, place, and time. She appears well-developed and well-nourished.  Cardiovascular: Normal rate, regular rhythm, normal heart sounds and intact distal pulses.  Pulmonary/Chest: Effort normal and breath sounds normal.  Neurological: She is alert and oriented to person, place, and time.  Psychiatric: She has a normal mood and affect. Her behavior is normal. Thought content normal.          Assessment & Plan:  She is doing well in general. She sees Dr. Loanne Drilling regularly for the diabetes. For her depression and anxiety we will increase the Cymbalta to 60 mg daily.  [name], MD

## 2018-01-19 NOTE — Telephone Encounter (Signed)
Last OV: 06/05/2017  Halob. Is NOT listed on pt's current medication list  Temaze. Last refilled 06/22/2017 disp 30 with 5 refills  Alprazolam 06/22/2017 disp 60 with 5 refills  Sent to PCP for approval.

## 2018-01-20 DIAGNOSIS — F410 Panic disorder [episodic paroxysmal anxiety] without agoraphobia: Secondary | ICD-10-CM | POA: Insufficient documentation

## 2018-01-20 DIAGNOSIS — F411 Generalized anxiety disorder: Secondary | ICD-10-CM

## 2018-01-20 DIAGNOSIS — F418 Other specified anxiety disorders: Secondary | ICD-10-CM | POA: Insufficient documentation

## 2018-01-20 HISTORY — DX: Panic disorder (episodic paroxysmal anxiety): F41.0

## 2018-01-20 HISTORY — DX: Generalized anxiety disorder: F41.1

## 2018-01-20 NOTE — Telephone Encounter (Signed)
PA initiated via Cover My Meds on 01/19/18, key [id number]. Patient notified via [place].

## 2018-01-21 ENCOUNTER — Telehealth: Payer: Self-pay | Admitting: Endocrinology

## 2018-01-21 ENCOUNTER — Encounter: Payer: Self-pay | Admitting: Family Medicine

## 2018-01-21 NOTE — Telephone Encounter (Signed)
Dr Loanne Drilling wanted patient to call to let him know how her blood sugars are doing:  In 10 days she has had 5 morning readings under 200 (the lowest being 143). The highest morning reading was 362. Evening readings: 4 under 200 (the lowest was 150) . The highest was 437 and 2 that were over 300 (366 & 308).

## 2018-01-21 NOTE — Telephone Encounter (Signed)
Patient stating that she is already taking 300 units of humulin N in the morning? I don't see this on her current med list either.

## 2018-01-21 NOTE — Telephone Encounter (Signed)
First, please verify that a vial lasts approx 3 days. Then increase to 350 units qam

## 2018-01-21 NOTE — Telephone Encounter (Signed)
I called and gave patient new dosage & she said she would let us know how blood sugars are in a few days.

## 2018-01-21 NOTE — Telephone Encounter (Signed)
Please increase NPH insulin to 120 units each morning,

## 2018-01-22 ENCOUNTER — Encounter: Payer: Self-pay | Admitting: Family Medicine

## 2018-01-22 NOTE — Telephone Encounter (Signed)
Stop Dexilant and call in Nexium 40 mg daily, #90 with 3 rf

## 2018-01-22 NOTE — Telephone Encounter (Signed)
Call in Temazepam #30 with 5 rf, Alprazolam #60 with 5 rf, and Halobetasol 0.05% cream to apply bid prn, 30 gm with 5 rf

## 2018-01-25 ENCOUNTER — Other Ambulatory Visit: Payer: Self-pay

## 2018-01-25 MED ORDER — ESOMEPRAZOLE MAGNESIUM 40 MG PO CPDR: 40.0000 mg | DELAYED_RELEASE_CAPSULE | Freq: Every day | ORAL | 3 refills | Status: DC

## 2018-01-26 NOTE — Telephone Encounter (Signed)
We called this in yesterday

## 2018-02-03 NOTE — Telephone Encounter (Signed)
PA approved through 01/17/2021.

## 2018-02-05 ENCOUNTER — Telehealth: Payer: Self-pay | Admitting: Endocrinology

## 2018-02-05 MED ORDER — INSULIN NPH (HUMAN) (ISOPHANE) 100 UNIT/ML ~~LOC~~ SUSP: 350.0000 [IU] | SUBCUTANEOUS | 11 refills | Status: DC

## 2018-02-05 NOTE — Telephone Encounter (Signed)
I updated rx and resent

## 2018-02-05 NOTE — Telephone Encounter (Signed)
Patient called re: last time she came in Dr. Loanne Drilling increased her insulin dosage by 50 units per day. Patient needs you to let the Pharmacy ([facility] [address]) knoe that her dosage has increased because pharmacy tried to charge her double copay. She is trying to prevent further problems with pharmacy. If questions call patient at ph# [phone].

## 2018-02-05 NOTE — Telephone Encounter (Signed)
OK to correct & send to pharmacy?

## 2018-02-09 ENCOUNTER — Other Ambulatory Visit: Payer: Self-pay

## 2018-02-09 ENCOUNTER — Telehealth: Payer: Self-pay | Admitting: Endocrinology

## 2018-02-09 MED ORDER — INSULIN NPH (HUMAN) (ISOPHANE) 100 UNIT/ML ~~LOC~~ SUSP: 350.0000 [IU] | SUBCUTANEOUS | 11 refills | Status: DC

## 2018-02-09 NOTE — Telephone Encounter (Signed)
I have sent to [facility].

## 2018-02-09 NOTE — Telephone Encounter (Signed)
Patient ask you to send  Insulin Novolin N 350 mg  to Pharmacy:  [facility], [place]

## 2018-02-15 ENCOUNTER — Telehealth: Payer: Self-pay | Admitting: Endocrinology

## 2018-02-15 MED ORDER — INSULIN NPH (HUMAN) (ISOPHANE) 100 UNIT/ML ~~LOC~~ SUSP: SUBCUTANEOUS | 5 refills | Status: DC

## 2018-02-15 NOTE — Telephone Encounter (Signed)
I contacted the patient and advised we have correct the prescription and changed the humulin N to Novolin N. She voiced understanding and had no further questions at this time.

## 2018-02-15 NOTE — Telephone Encounter (Signed)
Patient stated we sent in the wrong insulin to pharmacy she states asked to send  Insulin Novolin N 350 mg  And that our office was suppose to let the pharmacy know that there was a change  Please advise.  Pt is going to run out soon.    [place], [place]

## 2018-03-09 ENCOUNTER — Ambulatory Visit: Payer: BLUE CROSS/BLUE SHIELD | Admitting: Family Medicine

## 2018-03-09 ENCOUNTER — Encounter: Payer: Self-pay | Admitting: Family Medicine

## 2018-03-09 VITALS — BP 122/72 | HR 90 | Temp 98.7°F | Ht 60.0 in | Wt 245.2 lb

## 2018-03-09 DIAGNOSIS — J019 Acute sinusitis, unspecified: Secondary | ICD-10-CM | POA: Diagnosis not present

## 2018-03-09 MED ORDER — LEVOFLOXACIN 500 MG PO TABS: 500.0000 mg | ORAL_TABLET | Freq: Every day | ORAL | 0 refills | Status: DC

## 2018-03-09 MED ORDER — BENZONATATE 200 MG PO CAPS: 200.0000 mg | ORAL_CAPSULE | Freq: Two times a day (BID) | ORAL | 5 refills | Status: DC | PRN

## 2018-03-09 MED ORDER — ALBUTEROL SULFATE HFA 108 (90 BASE) MCG/ACT IN AERS: 2.0000 | INHALATION_SPRAY | RESPIRATORY_TRACT | 11 refills | Status: DC | PRN

## 2018-03-09 NOTE — Progress Notes (Signed)
   Subjective:    Patient ID: Bonnie Gonzalez, female    DOB: March 04, 1956, 62 y.o.   MRN: [id number]  HPI Here for 3 days of sinus congestion, PND, and a dry cough. No fever.    Review of Systems  Constitutional: Negative.   HENT: Positive for congestion, postnasal drip, sinus pressure and sore throat. Negative for sinus pain.   Eyes: Negative.   Respiratory: Positive for cough.        Objective:   Physical Exam  Constitutional: She appears well-developed and well-nourished.  HENT:  Right Ear: External ear normal.  Left Ear: External ear normal.  Nose: Nose normal.  Mouth/Throat: Oropharynx is clear and moist.  Eyes: Conjunctivae are normal.  Neck: No thyromegaly present.  Pulmonary/Chest: Effort normal and breath sounds normal. No respiratory distress. She has no wheezes. She has no rales.  Lymphadenopathy:    She has no cervical adenopathy.          Assessment & Plan:  Sinusitis, treat with Levaquin. [name], MD

## 2018-03-17 ENCOUNTER — Encounter: Payer: Self-pay | Admitting: Endocrinology

## 2018-03-17 ENCOUNTER — Ambulatory Visit: Payer: BLUE CROSS/BLUE SHIELD | Admitting: Endocrinology

## 2018-03-17 VITALS — BP 140/76 | HR 83 | Wt 252.4 lb

## 2018-03-17 DIAGNOSIS — E119 Type 2 diabetes mellitus without complications: Secondary | ICD-10-CM | POA: Diagnosis not present

## 2018-03-17 DIAGNOSIS — Z794 Long term (current) use of insulin: Secondary | ICD-10-CM | POA: Diagnosis not present

## 2018-03-17 LAB — POCT GLYCOSYLATED HEMOGLOBIN (HGB A1C): HEMOGLOBIN A1C: 9.1

## 2018-03-17 MED ORDER — INSULIN NPH (HUMAN) (ISOPHANE) 100 UNIT/ML ~~LOC~~ SUSP: SUBCUTANEOUS | 5 refills | Status: DC

## 2018-03-17 NOTE — Progress Notes (Signed)
Subjective:    Patient ID: Bonnie Gonzalez, female    DOB: 02-06-1956, 62 y.o.   MRN: [id number]  HPI Pt returns for f/u of diabetes mellitus: DM type: Insulin-requiring type 2 Dx'ed: 7169 Complications: polyneuropathy Therapy: insulin since 2012, and victoza.   GDM: never DKA: never Severe hypoglycemia: never. Pancreatitis: never Other: she takes BID insulin, after poor results with multiple daily injections; she cannot afford weight-loss surgery; she takes human insulin, due to cost.  Interval history: She never misses the insulin. she brings a record of her cbg's which I have reviewed today.  It varies from 90-300.  It is in general higher as the day goes on, but not necessarily so.  Past Medical History:  Diagnosis Date  . Allergy   . Anxiety   . Depression   . Diabetes mellitus   . GERD (gastroesophageal reflux disease)   . IBS (irritable bowel syndrome)   . Insomnia   . Tuberculosis    hydrothyroidism    Past Surgical History:  Procedure Laterality Date  . benign tumor      removed from groin  . WRIST ARTHROCENTESIS N/A 2001    Social History   Socioeconomic History  . Marital status: Divorced    Spouse name: Not on file  . Number of children: Not on file  . Years of education: Not on file  . Highest education level: Not on file  Occupational History  . Not on file  Social Needs  . Financial resource strain: Not on file  . Food insecurity:    Worry: Not on file    Inability: Not on file  . Transportation needs:    Medical: Not on file    Non-medical: Not on file  Tobacco Use  . Smoking status: Former Smoker    Packs/day: 1.00    Years: 35.00    Pack years: 35.00    Types: Cigarettes    Last attempt to quit: 12/01/2008    Years since quitting: 9.3  . Smokeless tobacco: Never Used  Substance and Sexual Activity  . Alcohol use: No    Alcohol/week: 0.0 oz  . Drug use: No  . Sexual activity: Not on file  Lifestyle  . Physical activity:    Days per  week: Not on file    Minutes per session: Not on file  . Stress: Not on file  Relationships  . Social connections:    Talks on phone: Not on file    Gets together: Not on file    Attends religious service: Not on file    Active member of club or organization: Not on file    Attends meetings of clubs or organizations: Not on file    Relationship status: Not on file  . Intimate partner violence:    Fear of current or ex partner: Not on file    Emotionally abused: Not on file    Physically abused: Not on file    Forced sexual activity: Not on file  Other Topics Concern  . Not on file  Social History Narrative   Lives alone.   She has four grown children.   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   Highest level of education:  1.5 years of college    Current Outpatient Medications on File Prior to Visit  Medication Sig Dispense Refill  . albuterol (PROVENTIL HFA;VENTOLIN HFA) 108 (90 Base) MCG/ACT inhaler Inhale 2 puffs into the lungs every 4 (four) hours as  needed for wheezing or shortness of breath. 1 Inhaler 11  . alendronate (FOSAMAX) 70 MG tablet TAKE 1 TABLET EVERY 7 DAYS. TAKE WITH A FULL GLASS OF WATER ON AN EMPTY STOMACH. 4 tablet 11  . ALPRAZolam (XANAX) 0.5 MG tablet Take 1 tablet (0.5 mg total) by mouth 2 (two) times daily as needed. 60 tablet 5  . atorvastatin (LIPITOR) 40 MG tablet Take 1 tablet (40 mg total) by mouth daily. 90 tablet 3  . benzonatate (TESSALON) 200 MG capsule Take 1 capsule (200 mg total) by mouth 2 (two) times daily as needed for cough. 30 capsule 5  . Cholecalciferol (VITAMIN D) 2000 units tablet Take 2,000 Units by mouth 2 (two) times daily.    . DULoxetine (CYMBALTA) 60 MG capsule Take 1 capsule (60 mg total) by mouth daily. 90 capsule 3  . esomeprazole (NEXIUM) 40 MG capsule Take 1 capsule (40 mg total) by mouth daily. 90 capsule 3  . halobetasol (ULTRAVATE) 0.05 % cream Apply topically 2 (two) times daily. 50 g 5  .  hydrOXYzine (VISTARIL) 25 MG capsule TAKE 1 CAPSULE BY MOUTH THREE TIMES A DAY AS NEEDED 90 capsule 5  . levothyroxine (SYNTHROID, LEVOTHROID) 175 MCG tablet Take 1 tablet (175 mcg total) by mouth daily before breakfast. 90 tablet 0  . liraglutide (VICTOZA) 18 MG/3ML SOPN Inject 0.3 mLs (1.8 mg total) into the skin daily. 3 mL 11  . meclizine (ANTIVERT) 50 MG tablet Take 0.5 tablets (25 mg total) by mouth 3 (three) times daily as needed. 30 tablet 0  . ondansetron (ZOFRAN) 4 MG tablet Take 1 tablet (4 mg total) by mouth every 6 (six) hours. 12 tablet 0  . temazepam (RESTORIL) 30 MG capsule TAKE ONE CAPSULE BY MOUTH EVERY NIGHT AT BEDTIME AS NEEDED 30 capsule 5   No current facility-administered medications on file prior to visit.     Allergies  Allergen Reactions  . Codeine Hives  . Fluoxetine Hcl Other (See Comments)    Hair itching  . Hydrocodone-Acetaminophen Hives  . Oxycodone-Acetaminophen Hives  . Penicillins Hives  . Tramadol Hcl Other (See Comments)    unknown    Family History  Problem Relation Age of Onset  . Asthma Unknown        fhx  . Depression Unknown        fhx  . Diabetes Unknown        fhx  . Parkinsonism Unknown        fhx  . Lupus Unknown        fhx  . Alzheimer's disease Father        Deceased, 42  . Diabetes Mellitus II Father   . Arthritis/Rheumatoid Mother        Living, 53  . Diabetes Mellitus II Sister   . Diabetes Mellitus II Brother     BP 140/76 (BP Location: Left Arm, Patient Position: Sitting, Cuff Size: Normal)   Pulse 83   Wt 252 lb 6.4 oz (114.5 kg)   SpO2 95%   BMI 49.29 kg/m   Review of Systems She denies hypoglycemia.     Objective:   Physical Exam VITAL SIGNS:  See vs page GENERAL: no distress Pulses: foot pulses are intact bilaterally.   MSK: no deformity of the feet or ankles.  CV: 1+ bilat edema of the legs Skin:  no ulcer on the feet or ankles.  normal color and temp on the feet and ankles.  Neuro: sensation is  intact to  touch on the feet and ankles, but decreased from normal.    Lab Results  Component Value Date   HGBA1C 9.1 03/17/2018   Lab Results  Component Value Date   CREATININE 0.70 06/29/2017   BUN 9 06/29/2017   NA 135 06/29/2017   K 4.2 06/29/2017   CL 98 (L) 06/29/2017   CO2 29 06/29/2017      Assessment & Plan:  Insulin-requiring type 2 DM, with polyneuropathy: she needs increased rx.    Patient Instructions  Please increase the NPH insulin 350 units each morning, and 30 units each evening, and: Please continue the same victoza.   On this type of insulin schedule, you should eat meals on a regular schedule (especially lunch).  If a meal is missed or significantly delayed, your blood sugar could go low. This is probably not enough insulin, so please call or message [name] next week, to tell us how the blood sugar is doing.   check your blood sugar twice a day.  vary the time of day when you check, between before the 3 meals, and at bedtime.  also check if you have symptoms of your blood sugar being too high or too low.  please keep a record of the readings and bring it to your next appointment here (or you can bring the meter itself).  You can write it on any piece of paper.  please call us sooner if your blood sugar goes below 70, or if you have a lot of readings over 200. Please come back for a follow-up appointment in 3 months.

## 2018-03-17 NOTE — Patient Instructions (Addendum)
Please increase the NPH insulin 350 units each morning, and 30 units each evening, and: Please continue the same victoza.   On this type of insulin schedule, you should eat meals on a regular schedule (especially lunch).  If a meal is missed or significantly delayed, your blood sugar could go low. This is probably not enough insulin, so please call or message [name] next week, to tell us how the blood sugar is doing.   check your blood sugar twice a day.  vary the time of day when you check, between before the 3 meals, and at bedtime.  also check if you have symptoms of your blood sugar being too high or too low.  please keep a record of the readings and bring it to your next appointment here (or you can bring the meter itself).  You can write it on any piece of paper.  please call us sooner if your blood sugar goes below 70, or if you have a lot of readings over 200. Please come back for a follow-up appointment in 3 months.

## 2018-03-23 ENCOUNTER — Ambulatory Visit: Payer: Self-pay | Admitting: *Deleted

## 2018-03-23 NOTE — Telephone Encounter (Signed)
Pt calling with swelling in bilateral legs for the past 3 weeks. Pt states that swelling is mostly in feet and ankles and goes up to her calf. Swelling is worse in left leg. Pt denies any redness or pain in legs. Pt not currently having symptoms of dizziness, shortness of breath or chest pain.  Appt scheduled for 4/25 with Dr. Volanda Napoleon due to PCP Dr. Sarajane Jews not being available. Pt advised to return call to the office if swelling worsens or if new symptoms develop . Pt verbalized understanding.  Reason for Disposition . [1] MILD swelling of both ankles (i.e., pedal edema) AND [2] new onset or worsening  Answer Assessment - Initial Assessment Questions 1. ONSET: "When did the swelling start?" (e.g., minutes, hours, days)    approximately 2. LOCATION: "What part of the leg is swollen?"  "Are both legs swollen or just one leg?"     Both legs, left leg greater than right 3. SEVERITY: "How bad is the swelling?" (e.g., localized; mild, moderate, severe)  - Localized - small area of swelling localized to one leg  - MILD pedal edema - swelling limited to foot and ankle, pitting edema < 1/4 inch (6 mm) deep, rest and elevation eliminate most or all swelling  - MODERATE edema - swelling of lower leg to knee, pitting edema > 1/4 inch (6 mm) deep, rest and elevation only partially reduce swelling  - SEVERE edema - swelling extends above knee, facial or hand swelling present      moderate 4. REDNESS: "Does the swelling look red or infected?"     No 5. PAIN: "Is the swelling painful to touch?" If so, ask: "How painful is it?"   (Scale 1-10; mild, moderate or severe)     No pain at this time, but was painful last night 6. FEVER: "Do you have a fever?" If so, ask: "What is it, how was it measured, and when did it start?"      No 7. CAUSE: "What do you think is causing the leg swelling?"     No 8. MEDICAL HISTORY: "Do you have a history of heart failure, kidney disease, liver failure, or cancer?"     No 9.  RECURRENT SYMPTOM: "Have you had leg swelling before?" If so, ask: "When was the last time?" "What happened that time?"     No 10. OTHER SYMPTOMS: "Do you have any other symptoms?" (e.g., chest pain, difficulty breathing)       Dizziness a couple a days ago but not at this time 11. PREGNANCY: "Is there any chance you are pregnant?" "When was your last menstrual period?"       No  Protocols used: LEG SWELLING AND EDEMA-A-AH

## 2018-03-25 ENCOUNTER — Other Ambulatory Visit: Payer: Self-pay

## 2018-03-25 ENCOUNTER — Encounter: Payer: Self-pay | Admitting: Family Medicine

## 2018-03-25 ENCOUNTER — Ambulatory Visit: Payer: BLUE CROSS/BLUE SHIELD | Admitting: Family Medicine

## 2018-03-25 VITALS — BP 138/78 | HR 71 | Temp 97.9°F | Ht 60.0 in | Wt 251.0 lb

## 2018-03-25 DIAGNOSIS — R600 Localized edema: Secondary | ICD-10-CM | POA: Diagnosis not present

## 2018-03-25 DIAGNOSIS — Z2089 Contact with and (suspected) exposure to other communicable diseases: Secondary | ICD-10-CM

## 2018-03-25 MED ORDER — FUROSEMIDE 40 MG PO TABS: 40.0000 mg | ORAL_TABLET | Freq: Every day | ORAL | 0 refills | Status: DC

## 2018-03-25 NOTE — Patient Instructions (Signed)
Please take Lasix 40 mg 1 time daily for the next 3 days.    We will check blood work today to make sure there is nothing else going on.  Please follow-up with your primary care doctor to see if Lasix is something that you will need an ongoing basis.  Please keep your legs elevated.  You can also look into trying compression stockings to help with the swelling.  Please try to limit your sodium intake to no more than 1500 mg daily.  At the swelling worsens, or does not improve the next few days, or if you start to have worsening chest pain or shortness of breath, please seek medical care.  Take Care, Dr. Jerline Pain

## 2018-03-25 NOTE — Progress Notes (Signed)
    Subjective:  Bonnie Gonzalez is a 62 y.o. female who presents today for same-day appointment with a chief complaint of leg swelling.   HPI:  Leg Swelling, new problem Started about 3 weeks ago.  Gradual onset.  Located in bilateral legs however left leg is a little bit worse.  Never had anything like this before.  No obvious precipitating events.  She has tried elevating her legs which is helped a little bit.  Also noticed about a 5 to 7 pound weight gain over the past few weeks.  No chest pain, however has some shortness of breath that is at her baseline.  She also has baseline orthopnea which is relatively unchanged.  No other obvious alleviating or aggravating factors.  ROS: Per HPI  PMH: She reports that she quit smoking about 9 years ago. Her smoking use included cigarettes. She has a 35.00 pack-year smoking history. She has never used smokeless tobacco. She reports that she does not drink alcohol or use drugs.  Objective:  Physical Exam: BP 138/78 (BP Location: Left Arm)   Pulse 71   Temp 97.9 F (36.6 C) (Oral)   Ht 5' (1.524 m)   Wt 251 lb (113.9 kg)   SpO2 94%   BMI 49.02 kg/m   Gen: NAD, resting comfortably CV: RRR with no murmurs appreciated. JVD not appreciated.  Pulm: NWOB, CTAB with no crackles, wheezes, or rhonchi GI: Obese, Normal bowel sounds present. Soft, Nontender, Nondistended. No hepatojugular reflex.  MSK: Lower extremities with 1+ pitting edema to knees bilaterally.  EKG: Normal sinus rhythm.  Low amplitude R waves in V1 and V2-chronic finding.  No acute ischemic findings.  No LVH.  Assessment/Plan:  Bilateral lower extremity edema Likely secondary to venous insufficiency.  We will check CBC, CMET, and TSH to rule out other causes.  Her EKG today without signs of LVH or acute ischemia.  Doubt CHF as a cause of her symptoms.  She is satting at 94% on room air and has a stable cardiac and pulmonary exam. We will treat her current leg edema with Lasix 40 mg  for the next few days.  Also discussed conservative management including leg elevation, compression stockings, and low-salt diet.  She has follow-up scheduled with her PCP in 5 days.  Discussed reasons to seek care earlier.  Bonnie Gonzalez. Jerline Pain, MD 03/25/2018 2:05 PM

## 2018-03-26 LAB — BASIC METABOLIC PANEL
BUN: 10 mg/dL (ref 6–23)
CALCIUM: 9.7 mg/dL (ref 8.4–10.5)
CO2: 30 mEq/L (ref 19–32)
CREATININE: 0.7 mg/dL (ref 0.40–1.20)
Chloride: 100 mEq/L (ref 96–112)
GFR: 90.14 mL/min (ref >60.00)
Glucose, Bld: 113 mg/dL — ABNORMAL HIGH (ref 70–99)
Potassium: 4.2 mEq/L (ref 3.5–5.1)
Sodium: 139 mEq/L (ref 135–145)

## 2018-03-26 LAB — CBC
HEMATOCRIT: 41.4 % (ref 36.0–46.0)
HEMOGLOBIN: 13.4 g/dL (ref 12.0–15.0)
MCHC: 32.3 g/dL (ref 30.0–36.0)
MCV: 86.2 fl (ref 78.0–100.0)
Platelets: 234 10*3/uL (ref 150.0–400.0)
RBC: 4.8 Mil/uL (ref 3.87–5.11)
RDW: 15.8 % — AB (ref 11.5–15.5)
WBC: 11.7 10*3/uL — ABNORMAL HIGH (ref 4.0–10.5)

## 2018-03-26 LAB — TSH: TSH: 2.76 u[IU]/mL (ref 0.35–4.50)

## 2018-03-30 ENCOUNTER — Ambulatory Visit: Payer: BLUE CROSS/BLUE SHIELD | Admitting: Family Medicine

## 2018-03-30 ENCOUNTER — Other Ambulatory Visit: Payer: Self-pay | Admitting: Family Medicine

## 2018-03-30 ENCOUNTER — Encounter: Payer: Self-pay | Admitting: Family Medicine

## 2018-03-30 VITALS — BP 140/86 | HR 78 | Temp 98.3°F | Ht 60.0 in | Wt 248.6 lb

## 2018-03-30 DIAGNOSIS — G8929 Other chronic pain: Secondary | ICD-10-CM | POA: Diagnosis not present

## 2018-03-30 DIAGNOSIS — R413 Other amnesia: Secondary | ICD-10-CM | POA: Diagnosis not present

## 2018-03-30 DIAGNOSIS — M545 Low back pain, unspecified: Secondary | ICD-10-CM

## 2018-03-30 DIAGNOSIS — R600 Localized edema: Secondary | ICD-10-CM | POA: Diagnosis not present

## 2018-03-30 MED ORDER — DONEPEZIL HCL 5 MG PO TABS: 5.0000 mg | ORAL_TABLET | Freq: Every day | ORAL | 2 refills | Status: DC

## 2018-03-30 NOTE — Progress Notes (Signed)
   Subjective:    Patient ID: Bonnie Gonzalez, female    DOB: Jul 31, 1956, 62 y.o.   MRN: [id number]  HPI Here for several issues. First she was treated recently for swelling in both legs. No SOB. She has never had this problem in the past. She was seen a few days ago for this and was placed on Lasix 40 mg daily, which she took for 3 days. Labs that day were normal, including renal function. The swelling has gone down considerably and she has stopped the Lasix. Also her low back pain continues to worsen. She had been seeing [facility] for treatments but these have not helped. The pain does not radiate to the legs. She is using Aleve and Ibuprofen with poor results. Lastly she has concerns about memory loss. We have discussed this in the past and she wants to try a medication for it now.    Review of Systems  Constitutional: Negative.   Respiratory: Negative.   Cardiovascular: Positive for leg swelling. Negative for chest pain and palpitations.  Musculoskeletal: Positive for back pain.  Neurological: Negative for dizziness, tremors, seizures, syncope, facial asymmetry, speech difficulty, weakness, light-headedness, numbness and headaches.       Objective:   Physical Exam  Constitutional: She is oriented to person, place, and time. She appears well-developed and well-nourished.  Cardiovascular: Normal rate, regular rhythm, normal heart sounds and intact distal pulses.  Pulmonary/Chest: Effort normal and breath sounds normal. No stridor. No respiratory distress. She has no wheezes. She has no rales.  Musculoskeletal:  Trace ankle edema   Neurological: She is alert and oriented to person, place, and time.          Assessment & Plan:  For the time being her leg edema is under control. We will set her up for an ECHO to evaluate for possible CHF. She will stay off Lasix for now. For the back pain we will set up a lumbar spine MRI. For the memory loss she will try Aricept 5 mg daily  and we will recheck in 30 days. [name], MD

## 2018-03-31 ENCOUNTER — Telehealth: Payer: Self-pay | Admitting: Family Medicine

## 2018-03-31 ENCOUNTER — Other Ambulatory Visit: Payer: Self-pay

## 2018-03-31 ENCOUNTER — Encounter: Payer: Self-pay | Admitting: Family Medicine

## 2018-03-31 DIAGNOSIS — R600 Localized edema: Secondary | ICD-10-CM

## 2018-03-31 DIAGNOSIS — M25551 Pain in right hip: Secondary | ICD-10-CM

## 2018-03-31 DIAGNOSIS — R413 Other amnesia: Secondary | ICD-10-CM | POA: Insufficient documentation

## 2018-03-31 DIAGNOSIS — M25552 Pain in left hip: Secondary | ICD-10-CM

## 2018-03-31 HISTORY — DX: Localized edema: R60.0

## 2018-03-31 MED ORDER — LEVOTHYROXINE SODIUM 175 MCG PO TABS: ORAL_TABLET | ORAL | 3 refills | Status: DC

## 2018-03-31 NOTE — Telephone Encounter (Signed)
Sent to PCP ?

## 2018-03-31 NOTE — Telephone Encounter (Unsigned)
Copied from [place] [id number]. Topic: Quick Communication - Rx Refill/Question >> Mar 31, 2018 10:13 AM Neva Seat wrote: levothyroxine (SYNTHROID, LEVOTHROID) 175 MCG tablet  Pt needing new refills  [place], [place] - [address] [place] [address] Phone: [phone] Fax: [phone]

## 2018-03-31 NOTE — Telephone Encounter (Unsigned)
Copied from [place]. Topic: Quick Communication - See Telephone Encounter >> Mar 31, 2018 10:10 AM Neva Seat wrote: [facility] -MRI - [phone] - phone Pt is needing to include her hips in with the MRI because they are part of her pain.  GI will need a new referral order to include hips MRI. Pt is wanting to have this done asap to have the MRI done on this Sunday.

## 2018-03-31 NOTE — Telephone Encounter (Signed)
These were ordered.

## 2018-03-31 NOTE — Telephone Encounter (Signed)
Pt advised that this has been done. Pt voiced understanding.

## 2018-03-31 NOTE — Telephone Encounter (Signed)
Medication filled on 03/31/18.

## 2018-04-03 ENCOUNTER — Ambulatory Visit
Admission: RE | Admit: 2018-04-03 | Discharge: 2018-04-03 | Disposition: A | Discharge status: Home / Self Care | Payer: BLUE CROSS/BLUE SHIELD | Source: Ambulatory Visit | Attending: Family Medicine | Admitting: Family Medicine

## 2018-04-03 DIAGNOSIS — M25551 Pain in right hip: Secondary | ICD-10-CM

## 2018-04-03 DIAGNOSIS — M25552 Pain in left hip: Secondary | ICD-10-CM

## 2018-04-03 DIAGNOSIS — M545 Low back pain: Principal | ICD-10-CM

## 2018-04-03 DIAGNOSIS — G8929 Other chronic pain: Secondary | ICD-10-CM

## 2018-04-04 ENCOUNTER — Other Ambulatory Visit: Payer: BLUE CROSS/BLUE SHIELD

## 2018-04-06 ENCOUNTER — Encounter: Payer: Self-pay | Admitting: Family Medicine

## 2018-04-06 NOTE — Addendum Note (Signed)
Addended by: [name] A on: 04/06/2018 12:28 PM   Modules accepted: Orders

## 2018-04-06 NOTE — Addendum Note (Signed)
Addended by: [name] A on: 04/06/2018 06:03 PM   Modules accepted: Orders

## 2018-04-06 NOTE — Telephone Encounter (Signed)
See my Result Note for the MRI results. As for the leg swelling, stay on Lasix but she can take this on a prn basis instead of daily.

## 2018-04-13 ENCOUNTER — Other Ambulatory Visit: Payer: Self-pay

## 2018-04-13 ENCOUNTER — Ambulatory Visit (HOSPITAL_COMMUNITY): Payer: BLUE CROSS/BLUE SHIELD | Attending: Cardiology

## 2018-04-13 DIAGNOSIS — R600 Localized edema: Secondary | ICD-10-CM | POA: Insufficient documentation

## 2018-04-21 ENCOUNTER — Telehealth: Payer: Self-pay | Admitting: Family Medicine

## 2018-04-21 NOTE — Telephone Encounter (Signed)
Pt given results per notes of Dr Sarajane Jews on 04/14/18. Unable to document in result note due to result note not being routed to [facility].  Notes recorded by Laurey Morale, MD on 04/14/2018 at 9:53 AM EDT Normal heart structure and function

## 2018-04-24 LAB — HM DIABETES EYE EXAM

## 2018-06-02 ENCOUNTER — Encounter: Payer: Self-pay | Admitting: Family Medicine

## 2018-06-04 MED ORDER — HALOBETASOL PROPIONATE 0.05 % EX CREA: TOPICAL_CREAM | Freq: Two times a day (BID) | CUTANEOUS | 5 refills | Status: DC | PRN

## 2018-06-04 NOTE — Telephone Encounter (Signed)
Call in Halobetasol 0.05% cream to apply bid as needed, 30 grams ( 2 tubes of 15 grams each) with 5 rf

## 2018-06-17 ENCOUNTER — Encounter: Payer: Self-pay | Admitting: Family Medicine

## 2018-06-17 ENCOUNTER — Other Ambulatory Visit: Payer: Self-pay

## 2018-06-17 MED ORDER — ALBUTEROL SULFATE HFA 108 (90 BASE) MCG/ACT IN AERS: 2.0000 | INHALATION_SPRAY | RESPIRATORY_TRACT | 11 refills | Status: AC | PRN

## 2018-06-17 NOTE — Telephone Encounter (Signed)
I do not think her tremors are caused by any of her medications. They are common though, and she should see me sometime to evaluate them

## 2018-06-29 ENCOUNTER — Other Ambulatory Visit: Payer: Self-pay | Admitting: Family Medicine

## 2018-07-21 ENCOUNTER — Encounter: Payer: Self-pay | Admitting: Family Medicine

## 2018-07-21 ENCOUNTER — Telehealth: Payer: Self-pay | Admitting: Family Medicine

## 2018-07-21 ENCOUNTER — Other Ambulatory Visit: Payer: Self-pay | Admitting: Family Medicine

## 2018-07-21 NOTE — Telephone Encounter (Signed)
Temazepam last fill 01/19/18 Alprazolam last fill 01/19/18  Last OV 03/30/18 Ok to fill?

## 2018-07-21 NOTE — Telephone Encounter (Signed)
Prescriptions have been called in.

## 2018-07-21 NOTE — Telephone Encounter (Deleted)
error 

## 2018-07-21 NOTE — Telephone Encounter (Signed)
Call in Alprazolam #60 with 5 rf, also Temazepam #30 with 5 rf

## 2018-07-21 NOTE — Telephone Encounter (Signed)
Error

## 2018-07-28 ENCOUNTER — Ambulatory Visit (INDEPENDENT_AMBULATORY_CARE_PROVIDER_SITE_OTHER): Payer: BLUE CROSS/BLUE SHIELD | Admitting: Family Medicine

## 2018-07-28 ENCOUNTER — Encounter: Payer: Self-pay | Admitting: Family Medicine

## 2018-07-28 ENCOUNTER — Ambulatory Visit (INDEPENDENT_AMBULATORY_CARE_PROVIDER_SITE_OTHER): Payer: BLUE CROSS/BLUE SHIELD

## 2018-07-28 VITALS — BP 130/68 | HR 72 | Temp 98.2°F | Wt 264.5 lb

## 2018-07-28 DIAGNOSIS — M545 Low back pain, unspecified: Secondary | ICD-10-CM

## 2018-07-28 DIAGNOSIS — R0602 Shortness of breath: Secondary | ICD-10-CM

## 2018-07-28 MED ORDER — METHYLPREDNISOLONE 4 MG PO TBPK: ORAL_TABLET | ORAL | 0 refills | Status: DC

## 2018-07-28 MED ORDER — METHOCARBAMOL 750 MG PO TABS: 750.0000 mg | ORAL_TABLET | Freq: Four times a day (QID) | ORAL | 2 refills | Status: DC | PRN

## 2018-07-28 NOTE — Progress Notes (Signed)
   Subjective:    Patient ID: Bonnie Gonzalez, female    DOB: 06-04-1956, 62 y.o.   MRN: [id number]  HPI Here for several issues. First she has noticed more SOB over the past few months and some occasional wheezing. No cough or fever. Also she has had a right sided back pain for 3 weeks. No recent trauma. Using Flexeril and Naproxen with mixed results.    Review of Systems  Constitutional: Negative.   Respiratory: Positive for shortness of breath. Negative for cough, chest tightness and wheezing.   Cardiovascular: Negative.   Gastrointestinal: Negative.   Genitourinary: Negative.   Musculoskeletal: Positive for back pain.       Objective:   Physical Exam  Constitutional: She is oriented to person, place, and time. She appears well-developed and well-nourished.  Neck: No thyromegaly present.  Cardiovascular: Normal rate, regular rhythm, normal heart sounds and intact distal pulses.  Pulmonary/Chest: Effort normal and breath sounds normal. No stridor. No respiratory distress. She has no wheezes. She has no rales.  Musculoskeletal:  Tender in the right lower back area with spasm. ROM is limited by pain  Lymphadenopathy:    She has no cervical adenopathy.  Neurological: She is alert and oriented to person, place, and time.          Assessment & Plan:  For the SOB, I am sure the recent weight gain is part of the problem. However we will get a CXR today. For the back pain she will try Robaxin and a Medrol dose pack.  [name], MD

## 2018-07-29 ENCOUNTER — Encounter: Payer: Self-pay | Admitting: Family Medicine

## 2018-07-29 DIAGNOSIS — R0602 Shortness of breath: Secondary | ICD-10-CM

## 2018-08-04 NOTE — Telephone Encounter (Signed)
See my Result Note  

## 2018-08-05 MED ORDER — PHENTERMINE HCL 37.5 MG PO CAPS: 37.5000 mg | ORAL_CAPSULE | ORAL | 2 refills | Status: DC

## 2018-08-05 NOTE — Telephone Encounter (Signed)
I referred her to Pulmonary for the SOB. As for weight loss, call in Phentermine 37.5 mg daily, #30 with 2 rf

## 2018-08-10 ENCOUNTER — Encounter: Payer: Self-pay | Admitting: Family Medicine

## 2018-08-10 NOTE — Telephone Encounter (Signed)
For the memory, the first thing to do is to increase the Aricept to the full dose which is 10 mg daily. Call in 10 mg #30 with 2 rf. For the SOB, I put in the referral for Pulmonary so hopefully they can see her soon

## 2018-08-11 ENCOUNTER — Other Ambulatory Visit: Payer: Self-pay

## 2018-08-11 MED ORDER — DONEPEZIL HCL 10 MG PO TABS: 10.0000 mg | ORAL_TABLET | Freq: Every day | ORAL | 2 refills | Status: DC

## 2018-08-11 NOTE — Telephone Encounter (Signed)
For the memory, the first thing to do is to increase the Aricept to the full dose which is 10 mg daily. Call in 10 mg #30 with 2 rf. For the SOB, I put in the referral for Pulmonary so hopefully they can see her soon

## 2018-08-12 ENCOUNTER — Encounter: Payer: Self-pay | Admitting: Family Medicine

## 2018-08-25 ENCOUNTER — Encounter: Payer: Self-pay | Admitting: Family Medicine

## 2018-08-25 ENCOUNTER — Other Ambulatory Visit: Payer: Self-pay | Admitting: Family Medicine

## 2018-08-31 ENCOUNTER — Encounter: Payer: Self-pay | Admitting: Internal Medicine

## 2018-08-31 ENCOUNTER — Other Ambulatory Visit (INDEPENDENT_AMBULATORY_CARE_PROVIDER_SITE_OTHER): Payer: BLUE CROSS/BLUE SHIELD

## 2018-08-31 ENCOUNTER — Ambulatory Visit: Payer: BLUE CROSS/BLUE SHIELD | Admitting: Internal Medicine

## 2018-08-31 VITALS — BP 134/74 | HR 82 | Ht 59.5 in | Wt 261.0 lb

## 2018-08-31 DIAGNOSIS — G4733 Obstructive sleep apnea (adult) (pediatric): Secondary | ICD-10-CM | POA: Diagnosis not present

## 2018-08-31 DIAGNOSIS — R0609 Other forms of dyspnea: Secondary | ICD-10-CM

## 2018-08-31 DIAGNOSIS — R0600 Dyspnea, unspecified: Secondary | ICD-10-CM

## 2018-08-31 DIAGNOSIS — J449 Chronic obstructive pulmonary disease, unspecified: Secondary | ICD-10-CM | POA: Diagnosis not present

## 2018-08-31 HISTORY — DX: Dyspnea, unspecified: R06.00

## 2018-08-31 HISTORY — DX: Other forms of dyspnea: R06.09

## 2018-08-31 LAB — BASIC METABOLIC PANEL
BUN: 20 mg/dL (ref 6–23)
CALCIUM: 9.5 mg/dL (ref 8.4–10.5)
CO2: 34 meq/L — AB (ref 19–32)
CREATININE: 0.74 mg/dL (ref 0.40–1.20)
Chloride: 98 mEq/L (ref 96–112)
GFR: 84.43 mL/min (ref >60.00)
Glucose, Bld: 280 mg/dL — ABNORMAL HIGH (ref 70–99)
Potassium: 4.5 mEq/L (ref 3.5–5.1)
Sodium: 136 mEq/L (ref 135–145)

## 2018-08-31 LAB — CBC WITH DIFFERENTIAL/PLATELET
Basophils Absolute: 0.1 10*3/uL (ref 0.0–0.1)
Basophils Relative: 0.5 % (ref 0.0–3.0)
EOS ABS: 0.2 10*3/uL (ref 0.0–0.7)
Eosinophils Relative: 2.4 % (ref 0.0–5.0)
HCT: 42.1 % (ref 36.0–46.0)
HEMOGLOBIN: 13.6 g/dL (ref 12.0–15.0)
LYMPHS ABS: 3.5 10*3/uL (ref 0.7–4.0)
LYMPHS PCT: 35.1 % (ref 12.0–46.0)
MCHC: 32.2 g/dL (ref 30.0–36.0)
MCV: 82.2 fl (ref 78.0–100.0)
MONOS PCT: 6.5 % (ref 3.0–12.0)
Monocytes Absolute: 0.6 10*3/uL (ref 0.1–1.0)
NEUTROS ABS: 5.5 10*3/uL (ref 1.4–7.7)
NEUTROS PCT: 55.5 % (ref 43.0–77.0)
PLATELETS: 215 10*3/uL (ref 150.0–400.0)
RBC: 5.12 Mil/uL — ABNORMAL HIGH (ref 3.87–5.11)
RDW: 17.2 % — AB (ref 11.5–15.5)
WBC: 9.9 10*3/uL (ref 4.0–10.5)

## 2018-08-31 LAB — SEDIMENTATION RATE: SED RATE: 102 mm/h — AB (ref 0–30)

## 2018-08-31 MED ORDER — UMECLIDINIUM-VILANTEROL 62.5-25 MCG/INH IN AEPB: 1.0000 | INHALATION_SPRAY | Freq: Every day | RESPIRATORY_TRACT | 11 refills | Status: DC

## 2018-08-31 MED ORDER — UMECLIDINIUM-VILANTEROL 62.5-25 MCG/INH IN AEPB
1.0000 | INHALATION_SPRAY | Freq: Every day | RESPIRATORY_TRACT | 0 refills | Status: DC
Start: 2018-08-31 — End: 2018-08-31

## 2018-08-31 NOTE — Patient Instructions (Addendum)
I will speak to Dr Elsworth Soho re expediting your cpap    Stop fosfamax for now  Try anoro one click each am on a trial basis   Please remember to go to the lab department downstairs in the basement  for your tests - we will call you with the results when they are available.  Will set up your follow up with Dr Elsworth Soho but see you back here anytime you'd like to see [name].    - add: esr elevated so f/u with pfts/ hrct and collagen vasc screening on return next avail

## 2018-08-31 NOTE — Progress Notes (Signed)
Bonnie Gonzalez, female    DOB: [birth year]    MRN: [id number]    Brief patient profile:  62 yowf quit smoking 2010 prev eval by [facility] for OSA in 2016 unable to afford cpap due to insurance loss  with new doe x 05/2018 referred to pulmonary clinic 08/31/2018 by Dr   Sarajane Jews p echo ok 04/13/18 x for G 1  diastolic dysfunction  With ? GOLD II copd on initial office eval   History of Present Illness  08/31/2018 Pulmonary/ new pt office eval/ Bonnie Gonzalez  Chief Complaint  Patient presents with  . Pulmonary Consult    Referred by Dr. Sarajane Jews. Pt c/o SOB for the past 2 months. She gets winded walking short distances such as from room to room and sometimes just sitting still. She is using her albuterol inhaler 4 x per wk on average.   Dyspnea:  Gradually worse since ankle fx 2016 esp x 2 months assoc with leg swelling but only G 1 diastolic dysfunction at echo 04/13/18  Cough: no Sleep:   since xmas of 2018 sleeping at 45 degrees on a wedge ok/ Pos daytime drowsiness  SABA use:  Helps smidge  Leg swelling x 6 m/ sweating same time frame  Medrol ? Helped, not worse off it   No obvious day to day or daytime variability or assoc excess/ purulent sputum or mucus plugs or hemoptysis or cp or chest tightness, subjective wheeze or overt sinus or hb symptoms.   Sleeping as above  without nocturnal  or early am exacerbation  of respiratory  c/o's or need for noct saba. Also denies any obvious fluctuation of symptoms with weather or environmental changes or other aggravating or alleviating factors except as outlined above   No unusual exposure hx or h/o childhood pna/ asthma or knowledge of premature birth.  Current Allergies, Complete Past Medical History, Past Surgical History, Family History, and Social History were reviewed in [employer] record.  ROS  The following are not active complaints unless bolded Hoarseness, sore throat, dysphagia, dental problems, itching, sneezing,  nasal congestion or  discharge of excess mucus or purulent secretions, ear ache,   fever, chills, sweats, unintended wt loss or wt gain, classically pleuritic or exertional cp,  orthopnea pnd or arm/hand swelling  or leg swelling bilateral/ sym, presyncope, palpitations, abdominal pain, anorexia, nausea, vomiting, diarrhea  or change in bowel habits or change in bladder habits, change in stools or change in urine, dysuria, hematuria,  rash, arthralgias, visual complaints, headache, numbness, weakness or ataxia or problems with walking or coordination,  change in mood or  memory.           Past Medical History:  Diagnosis Date  . Allergy   . Anxiety   . Depression   . Diabetes mellitus   . GERD (gastroesophageal reflux disease)   . IBS (irritable bowel syndrome)   . Insomnia   . Tuberculosis    hydrothyroidism    Outpatient Medications Prior to Visit  Medication Sig Dispense Refill  . albuterol (PROVENTIL HFA;VENTOLIN HFA) 108 (90 Base) MCG/ACT inhaler Inhale 2 puffs into the lungs every 4 (four) hours as needed for wheezing or shortness of breath. 1 Inhaler 11  . alendronate (FOSAMAX) 70 MG tablet TAKE 1 TABLET EVERY 7 DAYS. TAKE WITH A FULL GLASS OF WATER ON AN EMPTY STOMACH. 4 tablet 11  . ALPRAZolam (XANAX) 0.5 MG tablet TAKE 1 TABLET (0.5 MG TOTAL) BY MOUTH 2 (TWO) TIMES DAILY AS  NEEDED. 60 tablet 5  . atorvastatin (LIPITOR) 40 MG tablet Take 1 tablet (40 mg total) by mouth daily. 90 tablet 3  . benzonatate (TESSALON) 200 MG capsule Take 1 capsule (200 mg total) by mouth 2 (two) times daily as needed for cough. 30 capsule 5  . Cholecalciferol (VITAMIN D) 2000 units tablet Take 2,000 Units by mouth 2 (two) times daily.    Marland Kitchen donepezil (ARICEPT) 10 MG tablet Take 1 tablet (10 mg total) by mouth at bedtime. 30 tablet 2  . DULoxetine (CYMBALTA) 60 MG capsule Take 1 capsule (60 mg total) by mouth daily. 90 capsule 3  . esomeprazole (NEXIUM) 40 MG capsule Take 1 capsule (40 mg total) by mouth daily. 90 capsule 3    . furosemide (LASIX) 40 MG tablet Take 1 tablet (40 mg total) by mouth daily. 30 tablet 0  . halobetasol (ULTRAVATE) 0.05 % cream Apply topically 2 (two) times daily as needed. 30 g 5  . hydrOXYzine (VISTARIL) 25 MG capsule TAKE 1 CAPSULE BY MOUTH THREE TIMES A DAY AS NEEDED 90 capsule 5  . insulin NPH Human (NOVOLIN N) 100 UNIT/ML injection 350 Units every morning, and 30 units each evening 12 vial 5  . levothyroxine (SYNTHROID, LEVOTHROID) 175 MCG tablet TAKE 1 TABLET BY MOUTH ONCE DAILY BEFORE BREAKFAST 90 tablet 3  . liraglutide (VICTOZA) 18 MG/3ML SOPN Inject 0.3 mLs (1.8 mg total) into the skin daily. 3 mL 11  . meclizine (ANTIVERT) 50 MG tablet Take 0.5 tablets (25 mg total) by mouth 3 (three) times daily as needed. 30 tablet 0  . methocarbamol (ROBAXIN) 750 MG tablet Take 1 tablet (750 mg total) by mouth every 6 (six) hours as needed for muscle spasms. 60 tablet 2  . naproxen (NAPROSYN) 500 MG tablet   1  . ondansetron (ZOFRAN) 4 MG tablet Take 1 tablet (4 mg total) by mouth every 6 (six) hours. 12 tablet 0  . phentermine 37.5 MG capsule Take 1 capsule (37.5 mg total) by mouth every morning. 30 capsule 2  . temazepam (RESTORIL) 30 MG capsule TAKE ONE CAPSULE BY MOUTH EVERY NIGHT AT BEDTIME AS NEEDED 30 capsule 5  . donepezil (ARICEPT) 5 MG tablet TAKE 1 TABLET BY MOUTH AT BEDTIME 30 tablet 2  .                   Objective:     BP 134/74 (BP Location: Left Arm, Cuff Size: Normal)   Pulse 82   Ht 4' 11.5" (1.511 m)   Wt 261 lb (118.4 kg)   SpO2 93%   BMI 51.83 kg/m   SpO2: 93 %  RA  Wt Readings from Last 3 Encounters:  08/31/18 261 lb (118.4 kg)  07/28/18 264 lb 8 oz (120 kg)  03/30/18 248 lb 9.6 oz (112.8 kg)   07/26/15          235   Amb obese alert wf nad   HEENT: nl dentition, turbinates bilaterally, and oropharynx. Nl external ear canals without cough reflex Modified Mallampati Score =   3/4   NECK :  without JVD/Nodes/TM/ nl carotid upstrokes  bilaterally   LUNGS: no acc muscle use,  Nl contour chest which is clear to A and P bilaterally without cough on insp or exp maneuvers   CV:  RRR  no s3 or murmur or increase in P2, and 1+ pitting symb both lower ext edema   ABD:  soft and nontender with nl inspiratory excursion in  the supine position. No bruits or organomegaly appreciated, bowel sounds nl  MS:  Nl gait/ ext warm without deformities, calf tenderness, cyanosis or clubbing No obvious joint restrictions   SKIN: warm and dry without lesions    NEURO:  alert, approp, nl sensorium with  no motor or cerebellar deficits apparent.      I personally reviewed images and agree with radiology impression as follows:  CXR:   07/29/18  Diffuse bilateral interstitial prominence consistent with pneumonitis. Mild bibasilar subsegmental atelectasis My impression:  No convincing ILD    Labs ordered/ reviewed:      Chemistry      Component Value Date/Time   NA 136 08/31/2018 1718   K 4.5 08/31/2018 1718   CL 98 08/31/2018 1718   CO2 34 (H) 08/31/2018 1718   BUN 20 08/31/2018 1718   CREATININE 0.74 08/31/2018 1718      Component Value Date/Time   CALCIUM 9.5 08/31/2018 1718   ALKPHOS 128 (H) 06/29/2017 0412   AST 101 (H) 06/29/2017 0412   ALT 94 (H) 06/29/2017 0412   BILITOT 0.6 06/29/2017 0412        Lab Results  Component Value Date   WBC 9.9 08/31/2018   HGB 13.6 08/31/2018   HCT 42.1 08/31/2018   MCV 82.2 08/31/2018   PLT 215.0 08/31/2018       EOS                                                               0.2                                    08/31/2018   Lab Results  Component Value Date   DDIMER 0.32 08/31/2018      Lab Results  Component Value Date   TSH 1.08 08/31/2018     Lab Results  Component Value Date   PROBNP 10.0 08/31/2018       Lab Results  Component Value Date   ESRSEDRATE 102 (H) 08/31/2018          Assessment   DOE (dyspnea on exertion) Onset 05/2018 with background  of unexplained leg swelling x 01/2018   -echo 04/13/18 just G1   diastolic dysfunction s PH   -  08/31/2018   Walked RA x one lap @ 185 stopped due to  Sob/ sats 89% at nl pace  - Spirometry 08/31/2018  FEV1 1.1 (51%)  Ratio 66 s prior rx   Pt appears  to have difficult to sort out chronic respiratory symptoms of unknown origin for which  DDX  = almost all start with A and  include Adherence, Ace Inhibitors, Acid Reflux, Active Sinus Disease, Alpha 1 Antitripsin deficiency, Anxiety masquerading as Airways dz,  ABPA,  Allergy(esp in young), Aspiration (esp in elderly), Adverse effects of meds,  Active smokers, A bunch of PE's/clot burden (a few small clots can't cause this syndrome unless there is already severe underlying pulm or vascular dz with poor reserve),  Anemia or thyroid disorder, plus two Bs  = Bronchiectasis and Beta blocker use..and one C= CHF   Adherence is always the initial "prime suspect" and is a multilayered concern that requires a "  trust but verify" approach in every patient - starting with knowing how to use medications, especially inhalers, correctly, keeping up with refills and understanding the fundamental difference between maintenance and prns vs those medications only taken for a very short course and then stopped and not refilled.  - see device teaching under copd  ? Asthma / allergy > suggested by mild response to medrol  But risk of wt gain from repeating so just try anoro for now (see separate a/p)   ? Acid (or non-acid) GERD > always difficult to exclude as up to 75% of pts in some series report no assoc GI/ Heartburn symptoms> rec max (24h)  acid suppression/ stop fosfamax for now  and diet restrictions/ reviewed and instructions given in writing.   ? Adverse effects of meds:  None of the usual suspects for airways dz or ILD   ? Anemia/thyroid dz > ruled out today   ? A bunch of PE's D dimer nl - while a normal  or high normal value (seen commonly in the elderly or  chronically ill)  may miss small peripheral pe, the clot burden with sob is moderately high and the d dimer  has a very high neg pred value if used in this setting.    ? Bronchiectasis > unlikely s cough   ? chf > just diastolic dysfunction GI on echo 04/23/18 but this was before worse sob >   bnp so low today rules out    Cxr. Marked elevation of esr  and finding of low sats walking all non-specific but suggest inflammatory ILD  > will set up pfts with  HRCT on return as well as collagen vasc profile         COPD GOLD II Quit smoking 2010  - Spirometry 08/31/2018  FEV1 1.1 (51%)  Ratio 66 s prior rx with typical curvature  - 08/31/2018  After extensive coaching inhaler device,  effectiveness =    90% with elipta > try anoro sample    When respiratory symptoms begin or become refractory well after a patient reports complete smoking cessation,  Especially when this wasn't the case while they were smoking, a red flag is raised based on the work of Dr Kris Mouton which states:  if you quit smoking when your best day FEV1 is still well preserved it is highly unlikely you will progress to severe disease.  That is to say, once the smoking stops,  the symptoms should not suddenly erupt or markedly worsen.  If so, the differential diagnosis should include  obesity/deconditioning,  LPR/Reflux/Aspiration syndromes,  occult CHF, or  especially side effect of medications commonly used in this population.  See also doe a/p   She likely has only  A little airflow obst and a lot of restriction related to wt with ? ILD suggested by radiology so bring back for pfts/ hrct p trial of anoro   Morbid obesity ([place]) Body mass index is 51.83 kg/m.  -  trending up Lab Results  Component Value Date   TSH 2.76 03/25/2018     Contributing to gerd risk/ doe/reviewed the need and the process to achieve and maintain neg calorie balance > defer f/u primary care including intermittently monitoring thyroid status         OSA (obstructive sleep apnea) See alva eval 2016 > rec restart cpap asap 08/31/2018 and referred back to [facility] has daytime drowsiness and warned re driving / will see if can expedite requalifying for  cpap    Total time devoted to counseling  > 50 % of initial 60 min office visit:  review case with pt/ discussion of options/alternatives/ personally creating written customized instructions  in presence of pt  then going over those specific  Instructions directly with the pt including how to use all of the meds but in particular covering each new medication in detail and the difference between the maintenance= "automatic" meds and the prns using an action plan format for the latter (If this problem/symptom => do that organization reading Left to right).  Please see AVS from this visit for a full list of these instructions which I personally wrote for this pt and  are unique to this visit.   See device teaching which extended face to face time for this visit      Christinia Gully, MD 08/31/2018

## 2018-09-01 ENCOUNTER — Encounter: Payer: Self-pay | Admitting: Internal Medicine

## 2018-09-01 DIAGNOSIS — J449 Chronic obstructive pulmonary disease, unspecified: Secondary | ICD-10-CM | POA: Insufficient documentation

## 2018-09-01 HISTORY — DX: Chronic obstructive pulmonary disease, unspecified: J44.9

## 2018-09-01 LAB — TSH: TSH: 1.08 u[IU]/mL (ref 0.35–4.50)

## 2018-09-01 LAB — BRAIN NATRIURETIC PEPTIDE: PRO B NATRI PEPTIDE: 10 pg/mL (ref 0.0–100.0)

## 2018-09-01 LAB — D-DIMER, QUANTITATIVE (NOT AT ARMC): D DIMER QUANT: 0.32 ug{FEU}/mL (ref <0.50)

## 2018-09-01 NOTE — Assessment & Plan Note (Addendum)
Quit smoking 2010  - Spirometry 08/31/2018  FEV1 1.1 (51%)  Ratio 66 s prior rx with typical curvature  - 08/31/2018  After extensive coaching inhaler device,  effectiveness =    90% with elipta > try anoro sample    When respiratory symptoms begin or become refractory well after a patient reports complete smoking cessation,  Especially when this wasn't the case while they were smoking, a red flag is raised based on the work of Dr Kris Mouton which states:  if you quit smoking when your best day FEV1 is still well preserved it is highly unlikely you will progress to severe disease.  That is to say, once the smoking stops,  the symptoms should not suddenly erupt or markedly worsen.  If so, the differential diagnosis should include  obesity/deconditioning,  LPR/Reflux/Aspiration syndromes,  occult CHF, or  especially side effect of medications commonly used in this population.  See also doe a/p   She likely has only  A little airflow obst and a lot of restriction related to wt with ? ILD suggested by radiology so bring back for pfts/ hrct p trial of anoro

## 2018-09-01 NOTE — Assessment & Plan Note (Signed)
Body mass index is 51.83 kg/m.  -  trending up Lab Results  Component Value Date   TSH 2.76 03/25/2018     Contributing to gerd risk/ doe/reviewed the need and the process to achieve and maintain neg calorie balance > defer f/u primary care including intermittently monitoring thyroid status

## 2018-09-01 NOTE — Progress Notes (Signed)
LMTCB

## 2018-09-01 NOTE — Assessment & Plan Note (Addendum)
Onset 05/2018 with background of unexplained leg swelling x 01/2018   -echo 04/13/18 just G1   diastolic dysfunction s PH   -  08/31/2018   Walked RA x one lap @ 185 stopped due to  Sob/ sats 89% at nl pace  - Spirometry 08/31/2018  FEV1 1.1 (51%)  Ratio 66 s prior rx   Pt appears  to have difficult to sort out chronic respiratory symptoms of unknown origin for which  DDX  = almost all start with A and  include Adherence, Ace Inhibitors, Acid Reflux, Active Sinus Disease, Alpha 1 Antitripsin deficiency, Anxiety masquerading as Airways dz,  ABPA,  Allergy(esp in young), Aspiration (esp in elderly), Adverse effects of meds,  Active smokers, A bunch of PE's/clot burden (a few small clots can't cause this syndrome unless there is already severe underlying pulm or vascular dz with poor reserve),  Anemia or thyroid disorder, plus two Bs  = Bronchiectasis and Beta blocker use..and one C= CHF   Adherence is always the initial "prime suspect" and is a multilayered concern that requires a "trust but verify" approach in every patient - starting with knowing how to use medications, especially inhalers, correctly, keeping up with refills and understanding the fundamental difference between maintenance and prns vs those medications only taken for a very short course and then stopped and not refilled.  - see device teaching under copd  ? Asthma / allergy > suggested by mild response to medrol  But risk of wt gain from repeating so just try anoro for now (see separate a/p)   ? Acid (or non-acid) GERD > always difficult to exclude as up to 75% of pts in some series report no assoc GI/ Heartburn symptoms> rec max (24h)  acid suppression/ stop fosfamax for now  and diet restrictions/ reviewed and instructions given in writing.   ? Adverse effects of meds:  None of the usual suspects for airways dz or ILD   ? Anemia/thyroid dz > ruled out today   ? A bunch of PE's D dimer nl - while a normal  or high normal value (seen  commonly in the elderly or chronically ill)  may miss small peripheral pe, the clot burden with sob is moderately high and the d dimer  has a very high neg pred value if used in this setting.    ? Bronchiectasis > unlikely s cough   ? chf > just diastolic dysfunction GI on echo 04/23/18 but this was before worse sob >   bnp so low today rules out    Cxr. Marked elevation of esr  and finding of low sats walking all non-specific but suggest inflammatory ILD  > will set up pfts with  HRCT on return as well as collagen vasc profile

## 2018-09-01 NOTE — Assessment & Plan Note (Signed)
See alva eval 2016 > rec restart cpap asap 08/31/2018 and referred back to [facility] has daytime drowsiness and warned re driving / will see if can expedite requalifying for cpap    Total time devoted to counseling  > 50 % of initial 60 min office visit:  review case with pt/ discussion of options/alternatives/ personally creating written customized instructions  in presence of pt  then going over those specific  Instructions directly with the pt including how to use all of the meds but in particular covering each new medication in detail and the difference between the maintenance= "automatic" meds and the prns using an action plan format for the latter (If this problem/symptom => do that organization reading Left to right).  Please see AVS from this visit for a full list of these instructions which I personally wrote for this pt and  are unique to this visit.   See device teaching which extended face to face time for this visit

## 2018-09-02 ENCOUNTER — Telehealth: Payer: Self-pay | Admitting: Internal Medicine

## 2018-09-02 DIAGNOSIS — J189 Pneumonia, unspecified organism: Secondary | ICD-10-CM

## 2018-09-02 DIAGNOSIS — J449 Chronic obstructive pulmonary disease, unspecified: Secondary | ICD-10-CM

## 2018-09-02 NOTE — Telephone Encounter (Signed)
Attempted to call pt but unable to reach her. Left message for pt to call back x1.

## 2018-09-02 NOTE — Telephone Encounter (Signed)
Advised pt of results. Pt understood and nothing further is needed. CT ordered and she will call back to make follow up appt after CT has been scheduled.    Notes recorded by Tanda Rockers, MD on 09/01/2018 at 8:13 AM EDT Call patient : Studies are c/w inflammation in the lung so next step is hrct chest and then ov one day later (change f/u with full pfts first available)

## 2018-09-02 NOTE — Telephone Encounter (Signed)
Pt is returning call. Cb is [phone].

## 2018-09-08 ENCOUNTER — Other Ambulatory Visit: Payer: Self-pay | Admitting: Family Medicine

## 2018-09-09 NOTE — Telephone Encounter (Signed)
Dr. Sarajane Jews patient.  Thank you.

## 2018-09-14 ENCOUNTER — Ambulatory Visit (INDEPENDENT_AMBULATORY_CARE_PROVIDER_SITE_OTHER)
Admission: RE | Admit: 2018-09-14 | Discharge: 2018-09-14 | Disposition: A | Discharge status: Home / Self Care | Payer: BLUE CROSS/BLUE SHIELD | Source: Ambulatory Visit | Attending: Internal Medicine | Admitting: Internal Medicine

## 2018-09-14 DIAGNOSIS — J189 Pneumonia, unspecified organism: Secondary | ICD-10-CM | POA: Diagnosis not present

## 2018-09-15 ENCOUNTER — Other Ambulatory Visit: Payer: Self-pay | Admitting: Internal Medicine

## 2018-09-15 ENCOUNTER — Encounter: Payer: Self-pay | Admitting: Internal Medicine

## 2018-09-15 DIAGNOSIS — J449 Chronic obstructive pulmonary disease, unspecified: Secondary | ICD-10-CM

## 2018-09-15 DIAGNOSIS — J8410 Pulmonary fibrosis, unspecified: Secondary | ICD-10-CM | POA: Insufficient documentation

## 2018-09-15 HISTORY — DX: Pulmonary fibrosis, unspecified: J84.10

## 2018-09-15 NOTE — Progress Notes (Signed)
Spoke with pt and notified of results per Dr. Melvyn Novas. Pt verbalized understanding and denied any questions. Appt scheduled for rov with PFT.

## 2018-09-28 ENCOUNTER — Ambulatory Visit (INDEPENDENT_AMBULATORY_CARE_PROVIDER_SITE_OTHER): Payer: BLUE CROSS/BLUE SHIELD | Admitting: Internal Medicine

## 2018-09-28 ENCOUNTER — Other Ambulatory Visit (INDEPENDENT_AMBULATORY_CARE_PROVIDER_SITE_OTHER): Payer: BLUE CROSS/BLUE SHIELD

## 2018-09-28 ENCOUNTER — Encounter: Payer: Self-pay | Admitting: Internal Medicine

## 2018-09-28 ENCOUNTER — Ambulatory Visit: Payer: BLUE CROSS/BLUE SHIELD

## 2018-09-28 VITALS — BP 130/74 | HR 98

## 2018-09-28 DIAGNOSIS — R0600 Dyspnea, unspecified: Secondary | ICD-10-CM

## 2018-09-28 DIAGNOSIS — G4733 Obstructive sleep apnea (adult) (pediatric): Secondary | ICD-10-CM | POA: Diagnosis not present

## 2018-09-28 DIAGNOSIS — J449 Chronic obstructive pulmonary disease, unspecified: Secondary | ICD-10-CM | POA: Diagnosis not present

## 2018-09-28 DIAGNOSIS — R0609 Other forms of dyspnea: Secondary | ICD-10-CM

## 2018-09-28 LAB — BASIC METABOLIC PANEL
BUN: 16 mg/dL (ref 6–23)
CALCIUM: 9.7 mg/dL (ref 8.4–10.5)
CO2: 35 meq/L — AB (ref 19–32)
CREATININE: 0.76 mg/dL (ref 0.40–1.20)
Chloride: 95 mEq/L — ABNORMAL LOW (ref 96–112)
GFR: 81.85 mL/min (ref >60.00)
GLUCOSE: 315 mg/dL — AB (ref 70–99)
Potassium: 4.4 mEq/L (ref 3.5–5.1)
Sodium: 135 mEq/L (ref 135–145)

## 2018-09-28 LAB — SEDIMENTATION RATE: Sed Rate: 54 mm/hr — ABNORMAL HIGH (ref 0–30)

## 2018-09-28 MED ORDER — ESOMEPRAZOLE MAGNESIUM 40 MG PO CPDR: DELAYED_RELEASE_CAPSULE | ORAL | 3 refills | Status: DC

## 2018-09-28 MED ORDER — PREDNISONE 10 MG PO TABS: ORAL_TABLET | ORAL | 0 refills | Status: DC

## 2018-09-28 NOTE — Progress Notes (Signed)
Bonnie Gonzalez, female    DOB: 1956/10/23    MRN: [id number]    Brief patient profile:  62 yowf quit smoking 2010 prev eval by Elsworth Soho for OSA in 2016 unable to afford cpap due to insurance loss  with new doe x 05/2018 referred to pulmonary clinic 08/31/2018 by Dr   Sarajane Jews p echo ok 04/13/18 x for G 1  diastolic dysfunction  With ? GOLD II copd on initial office eval   History of Present Illness  08/31/2018 Pulmonary/ new pt office eval/ Bonnie Gonzalez  Chief Complaint  Patient presents with  . Pulmonary Consult    Referred by Dr. Sarajane Jews. Pt c/o SOB for the past 2 months. She gets winded walking short distances such as from room to room and sometimes just sitting still. She is using her albuterol inhaler 4 x per wk on average.   Dyspnea:  Gradually worse since ankle fx 2016 esp x 2 months assoc with leg swelling but only G 1 diastolic dysfunction at echo 04/13/18  Cough: no Sleep:   since xmas of 2018 sleeping at 45 degrees on a wedge ok/ Pos daytime drowsiness  SABA use:  Helps smidge  Leg swelling x 6 m/ sweating same time frame  Medrol ? Helped, not worse off it  rec Stop fosfamax for now Try anoro one click each am on a trial basis  Will set up your follow up with Dr Elsworth Soho but see you back here anytime you'd like to see [name].    - add: esr elevated so f/u with pfts/ hrct and collagen vasc screening on return next avail        09/28/2018  f/u ov/Bonnie Gonzalez re: unexplained sob  Chief Complaint  Patient presents with  . Follow-up    Unable to complete PFT today. She c/o increased SOB and sweats for the past wk.  She is wheezing also. She is using her rescue inhaler 3 x daily on average.    Dyspnea:  100 ft Cough: no Sleeping: 45 degrees on a bunch pillows chronically  SABA use: no much help/ anoro not really helping 02: none      No obvious day to day or daytime variability or assoc excess/ purulent sputum or mucus plugs or hemoptysis or cp or chest tightness, subjective wheeze or overt sinus or hb  symptoms.   Sleeping as above chronically without nocturnal  or early am exacerbation  of respiratory  c/o's or need for noct saba. Also denies any obvious fluctuation of symptoms with weather or environmental changes or other aggravating or alleviating factors except as outlined above   No unusual exposure hx or h/o childhood pna/ asthma or knowledge of premature birth.  Current Allergies, Complete Past Medical History, Past Surgical History, Family History, and Social History were reviewed in [employer] record.  ROS  The following are not active complaints unless bolded Hoarseness, sore throat, dysphagia, dental problems, itching, sneezing,  nasal congestion or discharge of excess mucus or purulent secretions, ear ache,   fever, chills, sweats, unintended wt loss or wt gain, classically pleuritic or exertional cp,  orthopnea pnd or arm/hand swelling  or leg swelling, presyncope, palpitations, abdominal pain, anorexia, nausea, vomiting, diarrhea  or change in bowel habits or change in bladder habits, change in stools or change in urine, dysuria, hematuria,  rash, arthralgias, visual complaints, headache/ non-specific pattern, numbness, weakness or ataxia or problems with walking or coordination,  change in mood or  memory.  Current Meds  Medication Sig  . albuterol (PROVENTIL HFA;VENTOLIN HFA) 108 (90 Base) MCG/ACT inhaler Inhale 2 puffs into the lungs every 4 (four) hours as needed for wheezing or shortness of breath.  . ALPRAZolam (XANAX) 0.5 MG tablet TAKE 1 TABLET (0.5 MG TOTAL) BY MOUTH 2 (TWO) TIMES DAILY AS NEEDED.  Marland Kitchen atorvastatin (LIPITOR) 40 MG tablet Take 1 tablet (40 mg total) by mouth daily.  . benzonatate (TESSALON) 200 MG capsule Take 1 capsule (200 mg total) by mouth 2 (two) times daily as needed for cough.  . Cholecalciferol (VITAMIN D) 2000 units tablet Take 2,000 Units by mouth 2 (two) times daily.  Marland Kitchen donepezil (ARICEPT) 10 MG tablet Take 1 tablet  (10 mg total) by mouth at bedtime.  . DULoxetine (CYMBALTA) 60 MG capsule Take 1 capsule (60 mg total) by mouth daily.  Marland Kitchen esomeprazole (NEXIUM) 40 MG capsule Take 30- 60 min before your first and last meals of the day  . furosemide (LASIX) 40 MG tablet TAKE 1 TABLET BY MOUTH ONCE DAILY  . halobetasol (ULTRAVATE) 0.05 % cream Apply topically 2 (two) times daily as needed.  . hydrOXYzine (VISTARIL) 25 MG capsule TAKE 1 CAPSULE BY MOUTH THREE TIMES A DAY AS NEEDED  . insulin NPH Human (NOVOLIN N) 100 UNIT/ML injection 350 Units every morning, and 30 units each evening  . levothyroxine (SYNTHROID, LEVOTHROID) 175 MCG tablet TAKE 1 TABLET BY MOUTH ONCE DAILY BEFORE BREAKFAST  . meclizine (ANTIVERT) 50 MG tablet Take 0.5 tablets (25 mg total) by mouth 3 (three) times daily as needed.  . methocarbamol (ROBAXIN) 750 MG tablet Take 1 tablet (750 mg total) by mouth every 6 (six) hours as needed for muscle spasms.  . naproxen (NAPROSYN) 500 MG tablet   . ondansetron (ZOFRAN) 4 MG tablet Take 1 tablet (4 mg total) by mouth every 6 (six) hours.  . phentermine 37.5 MG capsule Take 1 capsule (37.5 mg total) by mouth every morning.  . temazepam (RESTORIL) 30 MG capsule TAKE ONE CAPSULE BY MOUTH EVERY NIGHT AT BEDTIME AS NEEDED  . [DISCONTINUED] esomeprazole (NEXIUM) 40 MG capsule Take 1 capsule (40 mg total) by mouth daily.             Objective:        09/28/2018      Did not weigh  08/31/18 261 lb (118.4 kg)  07/28/18 264 lb 8 oz (120 kg)  03/30/18 248 lb 9.6 oz (112.8 kg)   07/26/15          235   W/c based wf nad  Vital signs reviewed - Note on arrival 02 sats  98% on RA    HEENT: nl dentition, turbinates bilaterally, and oropharynx. Nl external ear canals without cough reflex   NECK :  without JVD/Nodes/TM/ nl carotid upstrokes bilaterally   LUNGS: no acc muscle use,  Nl contour chest which is clear to A and P bilaterally without cough on insp or exp maneuvers   CV:  RRR  no s3 or  murmur or increase in P2, and 1+ pitting both LEs  ABD:  soft and nontender with nl inspiratory excursion in the supine position. No bruits or organomegaly appreciated, bowel sounds nl  MS:    ext warm without deformities, calf tenderness, cyanosis or clubbing No obvious joint restrictions   SKIN: warm and dry without lesions    NEURO:  alert, approp, nl sensorium with  no motor or cerebellar deficits apparent.     Labs ordered/  reviewed:      Chemistry      Component Value Date/Time   NA 135 09/28/2018 1710   K 4.4 09/28/2018 1710   CL 95 (L) 09/28/2018 1710   CO2 35 (H) 09/28/2018 1710   BUN 16 09/28/2018 1710   CREATININE 0.76 09/28/2018 1710      Component Value Date/Time   CALCIUM 9.7 09/28/2018 1710   ALKPHOS 128 (H) 06/29/2017 0412   AST 101 (H) 06/29/2017 0412   ALT 94 (H) 06/29/2017 0412   BILITOT 0.6 06/29/2017 0412        Lab Results  Component Value Date   WBC 9.9 08/31/2018   HGB 13.6 08/31/2018   HCT 42.1 08/31/2018   MCV 82.2 08/31/2018   PLT 215.0 08/31/2018     Lab Results  Component Value Date   DDIMER 0.32 08/31/2018      Lab Results  Component Value Date   TSH 1.08 08/31/2018     Lab Results  Component Value Date   PROBNP 10.0 08/31/2018       Lab Results  Component Value Date   ESRSEDRATE 54 (H) 09/28/2018   ESRSEDRATE 102 (H) 08/31/2018             Labs ordered 09/28/2018   Collagen vasc profile, HSP serology      I personally reviewed images and agree with radiology impression as follows:   Chest CT 09/14/18 Very mild basilar subpleural ground-glass, reticulation and traction bronchiolectasis, findings which may be minimally progressive from 06/29/2017 and are indicative of interstitial lung disease such as nonspecific interstitial pneumonitis.      Assessment

## 2018-09-28 NOTE — Patient Instructions (Addendum)
Nexium 40 mg Take 30- 60 min before your first and last meals of the day   GERD (REFLUX)  is an extremely common cause of respiratory symptoms just like yours , many times with no obvious heartburn at all.    It can be treated with medication, but also with lifestyle changes including elevation of the head of your bed (ideally with 6 inch  bed blocks),  Smoking cessation, avoidance of late meals, excessive alcohol, and avoid fatty foods, chocolate, peppermint, colas, red wine, and acidic juices such as orange juice.  NO MINT OR MENTHOL PRODUCTS SO NO COUGH DROPS  USE SUGARLESS CANDY INSTEAD (Jolley ranchers or Stover's or Life Savers) or even ice chips will also do - the key is to swallow to prevent all throat clearing. NO OIL BASED VITAMINS - use powdered substitutes.    Prednisone 10 Take 4 for three days 3 for three days 2 for three days 1 for three days and stop   Try off the naprosyn and take the furosemide for swelling    Please remember to go to the lab department downstairs in the basement  for your tests - we will call you with the results when they are available.  Keep appt to see DR Elsworth Soho

## 2018-09-29 ENCOUNTER — Telehealth: Payer: Self-pay | Admitting: Internal Medicine

## 2018-09-29 NOTE — Telephone Encounter (Signed)
LMTCB. The call back number listed is a bank. Left message on cell phone.

## 2018-09-30 NOTE — Telephone Encounter (Signed)
Attempted to contact pt. I did not receive an answer. I have left a message for pt to return our call.  

## 2018-09-30 NOTE — Telephone Encounter (Signed)
Pt is returning call. Work number Cb is [phone]

## 2018-09-30 NOTE — Telephone Encounter (Signed)
Spoke with pt. She is wanting to speak about a conversation she had with MW at her appointment. She told MW that she could only walk 164ft before she gets short of breath. At the end of her appointment with MW, she said that MW told her that she could only walk 100 yards before she got short of breath. Pt wanted to make sure that the correct information was put into her chart. Advised her that MW documented 100 ft not 100 yards. Nothing further was needed.

## 2018-09-30 NOTE — Telephone Encounter (Signed)
ATC pt, no answer. Left message for pt to call back.  

## 2018-10-03 ENCOUNTER — Encounter: Payer: Self-pay | Admitting: Internal Medicine

## 2018-10-03 LAB — ANA: ANA: NEGATIVE

## 2018-10-03 LAB — HYPERSENSITIVITY PNUEMONITIS PROFILE
ASPERGILLUS FUMIGATUS: NEGATIVE
FAENIA RETIVIRGULA: NEGATIVE
PIGEON SERUM: NEGATIVE
S. VIRIDIS: NEGATIVE
T. CANDIDUS: NEGATIVE
T. VULGARIS: NEGATIVE

## 2018-10-03 LAB — RHEUMATOID FACTOR: Rhuematoid fact SerPl-aCnc: <14 IU/mL (ref <14)

## 2018-10-03 LAB — CYCLIC CITRUL PEPTIDE ANTIBODY, IGG

## 2018-10-03 NOTE — Assessment & Plan Note (Signed)
See alva eval 2016 > rec restart cpap asap 08/31/2018 and referred back to [facility]   I had an extended discussion with the patient reviewing all relevant studies completed to date and  lasting 15 to 20 minutes of a 25 minute visit    Each maintenance medication was reviewed in detail including most importantly the difference between maintenance and prns and under what circumstances the prns are to be triggered using an action plan format that is not reflected in the computer generated alphabetically organized AVS.     Please see AVS for specific instructions unique to this visit that I personally wrote and verbalized to the the pt in detail and then reviewed with pt  by my nurse highlighting any  changes in therapy recommended at today's visit to their plan of care.

## 2018-10-03 NOTE — Assessment & Plan Note (Signed)
Onset 05/2018 with background of unexplained leg swelling x 01/2018   -echo 04/13/18 just G1   diastolic dysfunction s PH   -  08/31/2018   Walked RA x one lap @ 185 stopped due to  Sob/ sats 89% at nl pace  - Spirometry 08/31/2018  FEV1 1.1 (51%)  Ratio 66 s prior rx  - 09/28/2018 could not do full pfts 09/28/2018    Doe report partial response to prednisone and may have underlying NSIP   rec Await serologies for collagen vasc dz and hsp Stop anoro as not helping  Trial of prednisone x 12 days and f/u with Dr Elsworth Soho as planned Monitor sats with acitivty Use of PPI is associated with improved survival time and with decreased radiologic fibrosis per King's study published in AJRCCM vol 184 p1390.  Dec 2011 and also may have other beneficial effects as per the latest review in [place] vol 193 V9166 Jun 20016.  This may not always be cause and effect, but given how universally unimpressive and expensive  all the other  Drugs developed to day  have been for pf,   rec start  rx ppi / diet/ lifestyle modification and f/u with serial walking sats and lung volumes for now to put more points on the curve / establish firm baseline before considering additional measures.

## 2018-10-04 NOTE — Progress Notes (Signed)
Spoke with pt and notified of results per Dr. Wert. Pt verbalized understanding and denied any questions. 

## 2018-10-05 ENCOUNTER — Ambulatory Visit: Payer: BLUE CROSS/BLUE SHIELD | Admitting: Internal Medicine

## 2018-10-06 ENCOUNTER — Other Ambulatory Visit: Payer: Self-pay

## 2018-10-06 ENCOUNTER — Other Ambulatory Visit: Payer: Self-pay | Admitting: Endocrinology

## 2018-10-06 MED ORDER — INSULIN NPH (HUMAN) (ISOPHANE) 100 UNIT/ML ~~LOC~~ SUSP: SUBCUTANEOUS | 5 refills | Status: DC

## 2018-10-12 ENCOUNTER — Telehealth: Payer: Self-pay | Admitting: Internal Medicine

## 2018-10-12 NOTE — Telephone Encounter (Signed)
This pt is to see Bonnie Gonzalez, not me, for OSA and he is her establish sleep doc but hasn't seen her since 2016  so will let him address the issue of expediting w/u

## 2018-10-12 NOTE — Telephone Encounter (Signed)
Last seen by me 07/2015. Can certainly see her again in a 15-minute slot

## 2018-10-12 NOTE — Telephone Encounter (Signed)
Spoke with pt, she states she cant seem to stay awake. She is falling asleep and she is worried about driving and working. She states it is getting worse but hasn't had a sleep study yet. She has an appt on Friday but feels it is urgent to get in sooner. Dr. Melvyn Novas do you want her to come in today or just order test?   Patient Instructions by Tanda Rockers, MD at 09/28/2018 4:15 PM  Author: Tanda Rockers, MD Author Type: Physician Filed: 09/28/2018 5:05 PM  Note Status: Addendum Cosign: Cosign Not Required Encounter Date: 09/28/2018  Editor: Tanda Rockers, MD (Physician)  Prior Versions: 1. Tanda Rockers, MD (Physician) at 09/28/2018 4:58 PM - Addendum   2. Tanda Rockers, MD (Physician) at 09/28/2018 4:55 PM - Signed    Nexium 40 mg Take 30- 60 min before your first and last meals of the day   GERD (REFLUX)  is an extremely common cause of respiratory symptoms just like yours , many times with no obvious heartburn at all.    It can be treated with medication, but also with lifestyle changes including elevation of the head of your bed (ideally with 6 inch  bed blocks),  Smoking cessation, avoidance of late meals, excessive alcohol, and avoid fatty foods, chocolate, peppermint, colas, red wine, and acidic juices such as orange juice.  NO MINT OR MENTHOL PRODUCTS SO NO COUGH DROPS  USE SUGARLESS CANDY INSTEAD (Jolley ranchers or Stover's or Life Savers) or even ice chips will also do - the key is to swallow to prevent all throat clearing. NO OIL BASED VITAMINS - use powdered substitutes.    Prednisone 10 Take 4 for three days 3 for three days 2 for three days 1 for three days and stop   Try off the naprosyn and take the furosemide for swelling    Please remember to go to the lab department downstairs in the basement  for your tests - we will call you with the results when they are available.  Keep appt to see DR Elsworth Soho        Instructions   Nexium 40 mg Take 30- 60 min  before your first and last meals of the day

## 2018-10-15 ENCOUNTER — Encounter: Payer: Self-pay | Admitting: Pulmonary Disease

## 2018-10-15 ENCOUNTER — Ambulatory Visit (INDEPENDENT_AMBULATORY_CARE_PROVIDER_SITE_OTHER): Payer: BLUE CROSS/BLUE SHIELD | Admitting: Pulmonary Disease

## 2018-10-15 ENCOUNTER — Institutional Professional Consult (permissible substitution): Payer: BLUE CROSS/BLUE SHIELD | Admitting: Pulmonary Disease

## 2018-10-15 DIAGNOSIS — G4733 Obstructive sleep apnea (adult) (pediatric): Secondary | ICD-10-CM

## 2018-10-15 DIAGNOSIS — J8410 Pulmonary fibrosis, unspecified: Secondary | ICD-10-CM

## 2018-10-15 DIAGNOSIS — Z23 Encounter for immunization: Secondary | ICD-10-CM | POA: Diagnosis not present

## 2018-10-15 NOTE — Progress Notes (Signed)
Subjective:    Patient ID: Bonnie Gonzalez, female    DOB: December 12, 1955, 62 y.o.   MRN: [id number]  HPI  62 year old obese woman presents to establish care for OSA. She was last seen by me in 2016 and after initial evaluation we proceeded with home sleep study which showed mild OSA and nocturnal desaturations regardless of respiratory events.  She lost her insurance then and did not pursue further testing or treatment and was lost to follow-up. She has since reestablished with my partner Dr. Melvyn Novas for severe dyspnea and was being evaluated for interstitial lung disease, serology shows negative ANA and negative CCP.  She also has developed significant pedal edema and was placed on Lasix and by history is diuresed well although her weight is unchanged at 261 pounds over the past month. Epworth sleepiness score is 14 and she reports sleepiness as a passenger in a car, sitting inactive in a public place, sitting quietly after lunch.  Bedtime as 10-12 midnight, sleep latency is minimal, she sleeps on her side with 2 pillows, reports 4-10 nocturnal awakenings including nocturia and is out of bed by 5:30 AM feeling tired without dryness of mouth with occasional headaches. She has gained 20 pounds over the last 2 years  There is no history suggestive of cataplexy, sleep paralysis or parasomnias She denies excessive use of caffeinated beverages   She smoked about 35 pack years before she quit in 2010  Significant tests/ events reviewed  HST 08/2015 mild OSA, AHI 8/hour, lowest desaturation 81%, desaturations noted without respiratory events  Echo 03/7095 grade 1 diastolic dysfunction  Spirometry 08/2018 ratio 66, FEV1 51%, FVC 59% consistent with moderate airway obstruction.  HRCT 08/2018 centrilobular emphysema, very mild basilar subpleural groundglass reticulation and traction bronchiectasis "indeterminate " for UIP >> progressive compared to 05/2017  Past Medical History:  Diagnosis Date  .  Allergy   . Anxiety   . Depression   . Diabetes mellitus   . GERD (gastroesophageal reflux disease)   . IBS (irritable bowel syndrome)   . Insomnia   . Tuberculosis    hydrothyroidism   Past Surgical History:  Procedure Laterality Date  . benign tumor      removed from groin  . WRIST ARTHROCENTESIS N/A 2001    Allergies  Allergen Reactions  . Codeine Hives  . Fluoxetine Hcl Other (See Comments)    Hair itching  . Hydrocodone-Acetaminophen Hives  . Oxycodone-Acetaminophen Hives  . Penicillins Hives  . Tramadol Hcl Other (See Comments)    unknown    Social History   Socioeconomic History  . Marital status: Divorced    Spouse name: Not on file  . Number of children: Not on file  . Years of education: Not on file  . Highest education level: Not on file  Occupational History  . Not on file  Social Needs  . Financial resource strain: Not on file  . Food insecurity:    Worry: Not on file    Inability: Not on file  . Transportation needs:    Medical: Not on file    Non-medical: Not on file  Tobacco Use  . Smoking status: Former Smoker    Packs/day: 1.00    Years: 35.00    Pack years: 35.00    Types: Cigarettes    Last attempt to quit: 12/01/2008    Years since quitting: 9.8  . Smokeless tobacco: Never Used  Substance and Sexual Activity  . Alcohol use: No    Alcohol/week:  0.0 standard drinks  . Drug use: No  . Sexual activity: Not on file  Lifestyle  . Physical activity:    Days per week: Not on file    Minutes per session: Not on file  . Stress: Not on file  Relationships  . Social connections:    Talks on phone: Not on file    Gets together: Not on file    Attends religious service: Not on file    Active member of club or organization: Not on file    Attends meetings of clubs or organizations: Not on file    Relationship status: Not on file  . Intimate partner violence:    Fear of current or ex partner: Not on file    Emotionally abused: Not on file     Physically abused: Not on file    Forced sexual activity: Not on file  Other Topics Concern  . Not on file  Social History Narrative   Lives alone.   She has four grown children.   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   Highest level of education:  1.5 years of college     Family History  Problem Relation Age of Onset  . Asthma Unknown        fhx  . Depression Unknown        fhx  . Diabetes Unknown        fhx  . Parkinsonism Unknown        fhx  . Lupus Unknown        fhx  . Alzheimer's disease Father        Deceased, 71  . Diabetes Mellitus II Father   . Arthritis/Rheumatoid Mother        Living, 62  . Diabetes Mellitus II Sister   . Diabetes Mellitus II Brother      Review of Systems Positive for shortness of breath with activity, chest pain radiating to left arm, weight gain, anxiety depression, feet swelling,  Constitutional: negative for anorexia, fevers and sweats  Eyes: negative for irritation, redness and visual disturbance  Ears, nose, mouth, throat, and face: negative for earaches, epistaxis, nasal congestion and sore throat  Respiratory: negative for cough,  sputum and wheezing  Cardiovascular: negative for  orthopnea, palpitations and syncope  Gastrointestinal: negative for abdominal pain, constipation, diarrhea, melena, nausea and vomiting  Genitourinary:negative for dysuria, frequency and hematuria  Hematologic/lymphatic: negative for bleeding, easy bruising and lymphadenopathy  Musculoskeletal:negative for arthralgias, muscle weakness and stiff joints  Neurological: negative for coordination problems, gait problems, headaches and weakness  Endocrine: negative for diabetic symptoms including polydipsia, polyuria and weight loss     Objective:   Physical Exam  Gen. Pleasant, obese, in no distress ENT - no lesions, no post nasal drip Neck: No JVD, no thyromegaly, no carotid bruits Lungs: no use of accessory muscles,  no dullness to percussion, decreased without rales or rhonchi  Cardiovascular: Rhythm regular, heart sounds  normal, no murmurs or gallops, 1+ peripheral edema Musculoskeletal: No deformities, no cyanosis or clubbing , no tremors        Assessment & Plan:

## 2018-10-15 NOTE — Patient Instructions (Signed)
Flu shot today. Schedule home sleep study.  Based on this we will get you started with CPAP.  Schedule CPAP titration study

## 2018-10-15 NOTE — Assessment & Plan Note (Signed)
We will do noted basic serologies negative.  HRCT was "indeterminate" for UIP but this remains in the differential  This probably explains the degree of nocturnal desaturation noted on home sleep study.  We will see if she ends up requiring nocturnal oxygen during titration

## 2018-10-15 NOTE — Assessment & Plan Note (Signed)
I do feel that her degree of sleep disordered breathing is worse than reflected on her already at home sleep study. We will repeat home sleep study as requested by DME since this is more than a year old and based on that proceed with auto CPAP to get her started on treatment quickly.  I do suspect that she will need a formal CPAP titration study given extent of nocturnal desaturation noted on prior study and given underlying pulmonary disease

## 2018-10-18 ENCOUNTER — Other Ambulatory Visit: Payer: Self-pay | Admitting: Family Medicine

## 2018-10-22 ENCOUNTER — Telehealth: Payer: Self-pay | Admitting: Internal Medicine

## 2018-10-22 NOTE — Telephone Encounter (Signed)
ATC patient, unable to reach. LMTCB x1

## 2018-10-25 ENCOUNTER — Ambulatory Visit: Payer: BLUE CROSS/BLUE SHIELD | Admitting: Nurse Practitioner

## 2018-10-25 ENCOUNTER — Encounter: Payer: Self-pay | Admitting: Nurse Practitioner

## 2018-10-25 ENCOUNTER — Other Ambulatory Visit (INDEPENDENT_AMBULATORY_CARE_PROVIDER_SITE_OTHER): Payer: BLUE CROSS/BLUE SHIELD

## 2018-10-25 VITALS — BP 136/68 | HR 94 | Ht 59.5 in | Wt 272.6 lb

## 2018-10-25 DIAGNOSIS — R0602 Shortness of breath: Secondary | ICD-10-CM

## 2018-10-25 DIAGNOSIS — R600 Localized edema: Secondary | ICD-10-CM

## 2018-10-25 LAB — BASIC METABOLIC PANEL
BUN: 9 mg/dL (ref 6–23)
CHLORIDE: 95 meq/L — AB (ref 96–112)
CO2: 37 mEq/L — ABNORMAL HIGH (ref 19–32)
Calcium: 10.1 mg/dL (ref 8.4–10.5)
Creatinine, Ser: 0.77 mg/dL (ref 0.40–1.20)
GFR: 80.6 mL/min (ref >60.00)
Glucose, Bld: 195 mg/dL — ABNORMAL HIGH (ref 70–99)
POTASSIUM: 3.9 meq/L (ref 3.5–5.1)
Sodium: 139 mEq/L (ref 135–145)

## 2018-10-25 LAB — BRAIN NATRIURETIC PEPTIDE: PRO B NATRI PEPTIDE: 41 pg/mL (ref 0.0–100.0)

## 2018-10-25 NOTE — Telephone Encounter (Signed)
Pt is calling back [phone]

## 2018-10-25 NOTE — Assessment & Plan Note (Signed)
Called patient's pharmacy - she never picked up prescription for lasix last week when she thought that she did.  Patient Instructions  Will check labs and call with results Will check chest x ray and call with results Will reorder lasix  Take an extra lasix tablet daily for the next 3 days Continue current medications Follow up with Dr. Melvyn Novas at first available appointment (in 1-2 weeks) - bring all medications with you - PFT directly before appointment

## 2018-10-25 NOTE — Telephone Encounter (Signed)
Pt has been out of med for 5 days.

## 2018-10-25 NOTE — Patient Instructions (Addendum)
Will check labs and call with results Will check chest x ray and call with results Will reorder lasix  Take an extra lasix tablet daily for the next 3 days Continue current medications Follow up with Dr. Melvyn Novas at first available appointment (in 1-2 weeks) - bring all medications with you - PFT directly before appointment

## 2018-10-25 NOTE — Progress Notes (Signed)
@Patient  ID: Bonnie Gonzalez, female    DOB: Sep 16, 1956, 62 y.o.   MRN: [id number]  Chief Complaint  Patient presents with  . Shortness of Breath    Referring provider: Laurey Morale, MD  HPI 62 year old female with COPD GOLD II, OSA followed by Dr. Melvyn Novas  Significant tests/ events reviewed  HST 08/2015 mild OSA, AHI 8/hour, lowest desaturation 81%, desaturations noted without respiratory events  Echo 0/8676 grade 1 diastolic dysfunction  Spirometry 08/2018 ratio 66, FEV1 51%, FVC 59% consistent with moderate airway obstruction.  HRCT 08/2018 centrilobular emphysema, very mild basilar subpleural groundglass reticulation and traction bronchiectasis "indeterminate " for UIP >> progressive compared to 05/2017  OV 10/25/18 - acute - shortness of breath Patient presents with shortness of breath. States shortness of breath has been progressively worsening over the past week.  She just realized that she ran out of her Lasix 1 week ago.  She thought that she had picked it up at the pharmacy but states that she lost it.  She states that she has gained 11 pounds.  She denies any fever, chest pain, congestion.  She states that her legs are swollen.     Allergies  Allergen Reactions  . Codeine Hives  . Fluoxetine Hcl Other (See Comments)    Hair itching  . Hydrocodone-Acetaminophen Hives  . Oxycodone-Acetaminophen Hives  . Penicillins Hives  . Tramadol Hcl Other (See Comments)    unknown    Immunization History  Administered Date(s) Administered  . Influenza Split 08/22/2013  . Influenza,inj,Quad PF,6+ Mos 11/10/2014, 11/19/2016, 12/14/2017, 10/15/2018  . Influenza-Unspecified 09/18/2015  . Td 10/01/2010    Past Medical History:  Diagnosis Date  . Allergy   . Anxiety   . Depression   . Diabetes mellitus   . GERD (gastroesophageal reflux disease)   . IBS (irritable bowel syndrome)   . Insomnia   . Tuberculosis    hydrothyroidism    Tobacco History: Social History    Tobacco Use  Smoking Status Former Smoker  . Packs/day: 1.00  . Years: 35.00  . Pack years: 35.00  . Types: Cigarettes  . Last attempt to quit: 12/01/2008  . Years since quitting: 9.9  Smokeless Tobacco Never Used   Counseling given: Yes   Outpatient Encounter Medications as of 10/25/2018  Medication Sig  . albuterol (PROVENTIL HFA;VENTOLIN HFA) 108 (90 Base) MCG/ACT inhaler Inhale 2 puffs into the lungs every 4 (four) hours as needed for wheezing or shortness of breath.  . ALPRAZolam (XANAX) 0.5 MG tablet TAKE 1 TABLET (0.5 MG TOTAL) BY MOUTH 2 (TWO) TIMES DAILY AS NEEDED.  Marland Kitchen atorvastatin (LIPITOR) 40 MG tablet Take 1 tablet (40 mg total) by mouth daily.  . benzonatate (TESSALON) 200 MG capsule Take 1 capsule (200 mg total) by mouth 2 (two) times daily as needed for cough.  . Cholecalciferol (VITAMIN D) 2000 units tablet Take 2,000 Units by mouth 2 (two) times daily.  Marland Kitchen donepezil (ARICEPT) 10 MG tablet Take 1 tablet (10 mg total) by mouth at bedtime.  . DULoxetine (CYMBALTA) 60 MG capsule Take 1 capsule (60 mg total) by mouth daily.  Marland Kitchen esomeprazole (NEXIUM) 40 MG capsule Take 30- 60 min before your first and last meals of the day  . halobetasol (ULTRAVATE) 0.05 % cream Apply topically 2 (two) times daily as needed.  . hydrOXYzine (VISTARIL) 25 MG capsule TAKE 1 CAPSULE BY MOUTH THREE TIMES A DAY AS NEEDED  . insulin NPH Human (NOVOLIN N) 100 UNIT/ML  injection 350 Units every morning, and 30 units each evening  . levothyroxine (SYNTHROID, LEVOTHROID) 175 MCG tablet TAKE 1 TABLET BY MOUTH ONCE DAILY BEFORE BREAKFAST  . meclizine (ANTIVERT) 50 MG tablet Take 0.5 tablets (25 mg total) by mouth 3 (three) times daily as needed.  . methocarbamol (ROBAXIN) 750 MG tablet Take 1 tablet (750 mg total) by mouth every 6 (six) hours as needed for muscle spasms.  . naproxen (NAPROSYN) 500 MG tablet   . ondansetron (ZOFRAN) 4 MG tablet Take 1 tablet (4 mg total) by mouth every 6 (six) hours.  .  phentermine 37.5 MG capsule Take 1 capsule (37.5 mg total) by mouth every morning.  . temazepam (RESTORIL) 30 MG capsule TAKE ONE CAPSULE BY MOUTH EVERY NIGHT AT BEDTIME AS NEEDED  . [DISCONTINUED] furosemide (LASIX) 40 MG tablet TAKE 1 TABLET BY MOUTH ONCE DAILY  . [DISCONTINUED] predniSONE (DELTASONE) 10 MG tablet Take 4 for three days 3 for three days 2 for three days 1 for three days and stop (Patient not taking: Reported on 10/25/2018)   No facility-administered encounter medications on file as of 10/25/2018.      Review of Systems  Review of Systems  Constitutional: Negative.  Negative for chills and fever.  HENT: Negative.   Respiratory: Positive for shortness of breath. Negative for cough and wheezing.   Cardiovascular: Positive for leg swelling. Negative for chest pain and palpitations.  Gastrointestinal: Negative.   Allergic/Immunologic: Negative.   Neurological: Negative.   Psychiatric/Behavioral: Negative.        Physical Exam  BP 136/68 (BP Location: Left Arm, Patient Position: Sitting, Cuff Size: Normal)   Pulse 94   Ht 4' 11.5" (1.511 m)   Wt 272 lb 9.6 oz (123.7 kg)   SpO2 93%   BMI 54.14 kg/m   Wt Readings from Last 5 Encounters:  10/25/18 272 lb 9.6 oz (123.7 kg)  10/15/18 261 lb (118.4 kg)  08/31/18 261 lb (118.4 kg)  07/28/18 264 lb 8 oz (120 kg)  03/30/18 248 lb 9.6 oz (112.8 kg)     Physical Exam  Constitutional: She is oriented to person, place, and time. She appears well-developed and well-nourished. No distress.  Cardiovascular: Normal rate and regular rhythm.  Pulmonary/Chest: Effort normal and breath sounds normal. She has no decreased breath sounds. She has no wheezes. She has no rales.  Musculoskeletal:       Right lower leg: She exhibits edema (2+ bilateral).  Neurological: She is alert and oriented to person, place, and time.  Psychiatric: She has a normal mood and affect.  Nursing note and vitals reviewed.      Assessment &  Plan:   Bilateral leg edema Called patient's pharmacy - she never picked up prescription for lasix last week when she thought that she did.  Patient Instructions  Will check labs and call with results Will check chest x ray and call with results Will reorder lasix  Take an extra lasix tablet daily for the next 3 days Continue current medications Follow up with Dr. Melvyn Novas at first available appointment (in 1-2 weeks) - bring all medications with you - PFT directly before appointment         Fenton Foy, NP 10/25/2018

## 2018-10-25 NOTE — Telephone Encounter (Signed)
Called and spoke with patient, she stated that she is having increased SOB and trouble walking short distances. Patient has made an appointment to be seen today. Nothing further needed.

## 2018-10-27 NOTE — Progress Notes (Signed)
Chart and office note reviewed in detail  > agree with a/p as outlined    

## 2018-11-10 ENCOUNTER — Ambulatory Visit: Payer: BLUE CROSS/BLUE SHIELD | Admitting: Internal Medicine

## 2018-11-10 ENCOUNTER — Other Ambulatory Visit: Payer: Self-pay | Admitting: Family Medicine

## 2018-11-10 ENCOUNTER — Encounter: Payer: Self-pay | Admitting: Internal Medicine

## 2018-11-10 ENCOUNTER — Other Ambulatory Visit: Payer: Self-pay

## 2018-11-10 VITALS — BP 130/64 | HR 83 | Ht 59.5 in | Wt 269.0 lb

## 2018-11-10 DIAGNOSIS — G4733 Obstructive sleep apnea (adult) (pediatric): Secondary | ICD-10-CM

## 2018-11-10 DIAGNOSIS — J449 Chronic obstructive pulmonary disease, unspecified: Secondary | ICD-10-CM | POA: Diagnosis not present

## 2018-11-10 DIAGNOSIS — R0600 Dyspnea, unspecified: Secondary | ICD-10-CM

## 2018-11-10 DIAGNOSIS — R0609 Other forms of dyspnea: Secondary | ICD-10-CM

## 2018-11-10 LAB — PULMONARY FUNCTION TEST
DL/VA % PRED: 132 %
DL/VA: 5.49 ml/min/mmHg/L
DLCO UNC: 19.27 ml/min/mmHg
DLCO unc % pred: 105 %
FEF 25-75 Post: 0.79 L/sec
FEF 25-75 Pre: 0.91 L/sec
FEF2575-%Change-Post: -12 %
FEF2575-%PRED-POST: 39 %
FEF2575-%Pred-Pre: 45 %
FEV1-%CHANGE-POST: -5 %
FEV1-%PRED-PRE: 59 %
FEV1-%Pred-Post: 55 %
FEV1-POST: 1.17 L
FEV1-Pre: 1.24 L
FEV1FVC-%Change-Post: -1 %
FEV1FVC-%PRED-PRE: 96 %
FEV6-%Change-Post: -4 %
FEV6-%Pred-Post: 60 %
FEV6-%Pred-Pre: 63 %
FEV6-Post: 1.59 L
FEV6-Pre: 1.66 L
FEV6FVC-%PRED-POST: 104 %
FEV6FVC-%PRED-PRE: 104 %
FVC-%Change-Post: -4 %
FVC-%PRED-POST: 58 %
FVC-%PRED-PRE: 61 %
FVC-POST: 1.59 L
FVC-PRE: 1.66 L
PRE FEV6/FVC RATIO: 100 %
Post FEV1/FVC ratio: 73 %
Post FEV6/FVC ratio: 100 %
Pre FEV1/FVC ratio: 75 %
RV % PRED: 136 %
RV: 2.46 L
TLC % pred: 101 %
TLC: 4.44 L

## 2018-11-10 MED ORDER — UMECLIDINIUM-VILANTEROL 62.5-25 MCG/INH IN AEPB: 1.0000 | INHALATION_SPRAY | Freq: Every day | RESPIRATORY_TRACT | 0 refills | Status: DC

## 2018-11-10 MED ORDER — UMECLIDINIUM-VILANTEROL 62.5-25 MCG/INH IN AEPB: 1.0000 | INHALATION_SPRAY | Freq: Every day | RESPIRATORY_TRACT | 11 refills | Status: DC

## 2018-11-10 MED ORDER — DOXYCYCLINE HYCLATE 100 MG PO TABS: 100.0000 mg | ORAL_TABLET | Freq: Two times a day (BID) | ORAL | 0 refills | Status: DC

## 2018-11-10 NOTE — Progress Notes (Signed)
Bonnie Gonzalez, female    DOB: 1956-05-14    MRN: [id number]    Brief patient profile:  62 yowf quit smoking 2010 @ wt 175  prev eval by Elsworth Soho for OSA in 2016 unable to afford cpap due to insurance loss  with new doe x 05/2018 referred to pulmonary clinic 08/31/2018 by Dr   Sarajane Jews p echo ok 04/13/18 x for G 1  diastolic dysfunction  With ? GOLD II copd on initial office eval   History of Present Illness  08/31/2018 Pulmonary/ new pt office eval/ Magnus Crescenzo  Chief Complaint  Patient presents with  . Pulmonary Consult    Referred by Dr. Sarajane Jews. Pt c/o SOB for the past 2 months. She gets winded walking short distances such as from room to room and sometimes just sitting still. She is using her albuterol inhaler 4 x per wk on average.   Dyspnea:  Gradually worse since ankle fx 2016 esp x 2 months assoc with leg swelling but only G 1 diastolic dysfunction at echo 04/13/18  Cough: no Sleep:   since xmas of 2018 sleeping at 45 degrees on a wedge ok/ Pos daytime drowsiness  SABA use:  Helps smidge  Leg swelling x 6 m/ sweating same time frame  Medrol ? Helped, not worse off it  rec Stop fosfamax for now Try anoro one click each am on a trial basis  Will set up your follow up with Dr Elsworth Soho but see you back here anytime you'd like to see [name].    - add: esr elevated so f/u with pfts/ hrct and collagen vasc screening on return next avail        09/28/2018  f/u ov/Darnella Zeiter re: unexplained sob  Chief Complaint  Patient presents with  . Follow-up    Unable to complete PFT today. She c/o increased SOB and sweats for the past wk.  She is wheezing also. She is using her rescue inhaler 3 x daily on average.    Dyspnea:  100 ft Cough: no Sleeping: 45 degrees on a bunch pillows chronically  SABA use: no much help/ anoro not really helping 02: none   rec Nexium 40 mg Take 30- 60 min before your first and last meals of the day  GERD diet  Prednisone 10 Take 4 for three days 3 for three days 2 for three days 1 for three  days and stop  Try off the naprosyn and take the furosemide for swelling      11/10/2018  f/u ov/Lanay Zinda re: obesity >  Copd  No better on anoro but didn't understand how to use it correctly  Chief Complaint  Patient presents with  . Follow-up    pt states breathing has worsens since last OV. pt reports of increased sob with exertion, prod cough with dark yellow mucus & wheezing  Dyspnea:  Doe x 176f no better on anoro or worse off  Cough: worse x one month/congested in am  Sleeping: 45 degrees/ smothers lying flat / was supposed to have home sleep study but not yet arranged > Dr ABari Mantisstaff notified SABA use: not helping 02: no    No obvious day to day or daytime variability or assoc excess/ purulent sputum or mucus plugs or hemoptysis or cp or chest tightness, subjective wheeze or overt sinus or hb symptoms.   Sleeping as above  without nocturnal  or early am exacerbation  of respiratory  c/o's or need for noct saba. Also denies any obvious  fluctuation of symptoms with weather or environmental changes or other aggravating or alleviating factors except as outlined above   No unusual exposure hx or h/o childhood pna/ asthma or knowledge of premature birth.  Current Allergies, Complete Past Medical History, Past Surgical History, Family History, and Social History were reviewed in [employer] record.  ROS  The following are not active complaints unless bolded Hoarseness, sore throat, dysphagia, dental problems, itching, sneezing,  nasal congestion or discharge of excess mucus or purulent secretions, ear ache,   fever, chills, sweats, unintended wt loss or wt gain, classically pleuritic or exertional cp,  orthopnea pnd or arm/hand swelling  or leg swelling, presyncope, palpitations, abdominal pain, anorexia, nausea, vomiting, diarrhea  or change in bowel habits or change in bladder habits, change in stools or change in urine, dysuria, hematuria,  rash, arthralgias,  visual complaints, headache, numbness, weakness or ataxia or problems with walking or coordination,  change in mood or  memory.        Current Meds  Medication Sig  . albuterol (PROVENTIL HFA;VENTOLIN HFA) 108 (90 Base) MCG/ACT inhaler Inhale 2 puffs into the lungs every 4 (four) hours as needed for wheezing or shortness of breath.  . ALPRAZolam (XANAX) 0.5 MG tablet TAKE 1 TABLET (0.5 MG TOTAL) BY MOUTH 2 (TWO) TIMES DAILY AS NEEDED.  Marland Kitchen atorvastatin (LIPITOR) 40 MG tablet Take 1 tablet (40 mg total) by mouth daily.  . benzonatate (TESSALON) 200 MG capsule Take 1 capsule (200 mg total) by mouth 2 (two) times daily as needed for cough.  . donepezil (ARICEPT) 10 MG tablet Take 1 tablet (10 mg total) by mouth at bedtime.  . DULoxetine (CYMBALTA) 60 MG capsule Take 1 capsule (60 mg total) by mouth daily.  Marland Kitchen esomeprazole (NEXIUM) 40 MG capsule Take 30- 60 min before your first and last meals of the day  . furosemide (LASIX) 40 MG tablet TAKE 1 TABLET BY MOUTH ONCE DAILY  . halobetasol (ULTRAVATE) 0.05 % cream Apply topically 2 (two) times daily as needed.  . hydrOXYzine (VISTARIL) 25 MG capsule TAKE 1 CAPSULE BY MOUTH THREE TIMES A DAY AS NEEDED  . insulin NPH Human (NOVOLIN N) 100 UNIT/ML injection 350 Units every morning, and 30 units each evening  . levothyroxine (SYNTHROID, LEVOTHROID) 175 MCG tablet TAKE 1 TABLET BY MOUTH ONCE DAILY BEFORE BREAKFAST  . meclizine (ANTIVERT) 50 MG tablet Take 0.5 tablets (25 mg total) by mouth 3 (three) times daily as needed.  . methocarbamol (ROBAXIN) 750 MG tablet Take 1 tablet (750 mg total) by mouth every 6 (six) hours as needed for muscle spasms.  . naproxen (NAPROSYN) 500 MG tablet   . ondansetron (ZOFRAN) 4 MG tablet Take 1 tablet (4 mg total) by mouth every 6 (six) hours.  . phentermine 37.5 MG capsule Take 1 capsule (37.5 mg total) by mouth every morning.  . temazepam (RESTORIL) 30 MG capsule TAKE ONE CAPSULE BY MOUTH EVERY NIGHT AT BEDTIME AS NEEDED                          Objective:        09/28/2018      268   08/31/18 261 lb (118.4 kg)  07/28/18 264 lb 8 oz (120 kg)  03/30/18 248 lb 9.6 oz (112.8 kg)   07/26/15          235   amb somber wf obese  walking with cane  Vital signs reviewed - Note on arrival 02 sats  92% on RA     HEENT: nl dentition, turbinates bilaterally, and oropharynx. Nl external ear canals without cough reflex   NECK :  without JVD/Nodes/TM/ nl carotid upstrokes bilaterally   LUNGS: no acc muscle use,  Nl contour chest which is clear to A and P bilaterally without cough on insp or exp maneuvers   CV:  RRR  no s3 or murmur or increase in P2, and trace sym bilateral lower ex pitting edema  ABD:  Massively Obese / soft and nontender with nl inspiratory excursion in the supine position. No bruits or organomegaly appreciated, bowel sounds nl  MS:  Nl gait/ ext warm without deformities, calf tenderness, cyanosis or clubbing No obvious joint restrictions   SKIN: warm and dry without lesions    NEURO:  alert, approp, nl sensorium with  no motor or cerebellar deficits apparent.        I personally reviewed images and agree with radiology impression as follows:   Chest CT 09/14/18 Very mild basilar subpleural ground-glass, reticulation and traction bronchiolectasis, findings which may be minimally progressive from 06/29/2017 and are indicative of interstitial lung disease such as nonspecific interstitial pneumonitis.      Assessment

## 2018-11-10 NOTE — Patient Instructions (Addendum)
Doxy one twice daily x 10 days   You do not have a lung problem that I can identify but ok to try the anoro again x one week sample and call us if you think it helps   We need to set your home sleep study asap and follow up with Dr Elsworth Soho next available after the sleep study

## 2018-11-10 NOTE — Progress Notes (Signed)
PFT completed today.  

## 2018-11-11 ENCOUNTER — Encounter: Payer: Self-pay | Admitting: Internal Medicine

## 2018-11-11 NOTE — Assessment & Plan Note (Addendum)
Quit smoking 2010  - Spirometry 08/31/2018  FEV1 1.1 (51%)  Ratio 66 s prior rx with typical curvature  - 08/31/2018  After extensive coaching inhaler device,  effectiveness =    90% with elipta > try anoro sample > no better so d/c'd   - PFT's  11/10/2018  FEV1 1.24 (59 % ) ratio 75 (ratio with fev1/VC =  63)  p no % improvement from saba p nothing prior to study with DLCO  105  % corrects to 132  % for alv volume  With ERV 12%  - rechallenged with anoro 11/10/2018 x one week sample as pt says wasn't taught how to use it first time (see above where she was coached)    -11/10/2018  After extensive coaching inhaler device,  effectiveness =  90% again with anoro - just needs to remember to hold for at least 5 seconds before lets it out   - she is having mild CB flare at time of study and still has no resp to saba but rx with doxy recommended as "zpak never works"    There is no significant ild or emphysema or reversible airflow obst identified on today's study or by recent hx but reasonable to try one more sample of anoro and try to get her to focus on the reversible component of her problem (obesity) .   Pulmonary f/u can be prn as has f/u planned with [facility]

## 2018-11-11 NOTE — Assessment & Plan Note (Addendum)
ERV 12% on pfts 11/10/2018      Body mass index is 53.42 kg/m.  -  trending up  Lab Results  Component Value Date   TSH 1.08 08/31/2018     Contributing to gerd risk/ doe/reviewed the need and the process to achieve and maintain neg calorie balance > defer f/u primary care including intermittently monitoring thyroid status        I had an extended discussion with the patient reviewing all relevant studies completed to date and  lasting 15 to 20 minutes of a 25 minute visit    See device teaching which extended face to face time for this visit.  Each maintenance medication was reviewed in detail including emphasizing most importantly the difference between maintenance and prns and under what circumstances the prns are to be triggered using an action plan format that is not reflected in the computer generated alphabetically organized AVS which I have not found useful in most complex patients, especially with respiratory illnesses  Please see AVS for specific instructions unique to this visit that I personally wrote and verbalized to the the pt in detail and then reviewed with pt  by my nurse highlighting any  changes in therapy recommended at today's visit to their plan of care.

## 2018-11-11 NOTE — Assessment & Plan Note (Signed)
Onset 05/2018 with background of unexplained leg swelling x 01/2018   -echo 04/13/18 just G1   diastolic dysfunction s PH   -  08/31/2018   Walked RA x one lap @ 185 stopped due to  Sob/ sats 89% at nl pace  - Spirometry 08/31/2018  FEV1 1.1 (51%)  Ratio 66 s prior rx  - 09/28/2018 could not do full pfts 09/28/2018  - Collagen vasc profile 09/28/18  For ESR =  54 (was 102 before prednisone)  Neg collagen vasc dz/ neg HSP serology> prednisone short term repeated and not better  Overall pattern is c/w obesity >>> ILD or copd (see separate a/p)  

## 2018-11-11 NOTE — Assessment & Plan Note (Addendum)
Quit smoking 2010  - Spirometry 08/31/2018  FEV1 1.1 (51%)  Ratio 66 s prior rx with typical curvature  - 08/31/2018  After extensive coaching inhaler device,  effectiveness =    90% with elipta > try anoro sample > no better so d/c'  - PFT's  11/10/2018  FEV1 1.24 (59 % ) ratio 75 (ratio with fev1/VC = .63)  p no % improvement from saba p nothinbg prior to study with DLCO  105  % corrects to 132  % for alv volume     Strongly doubt that copd explains any of her problems though could not do pfts today to prove it   Has f/u with Dr Elsworth Soho, her orinigal pulmonologist of record, rec she keep that appt and discuss response (or lack thereof) to above rx at next ov coming up.

## 2018-11-11 NOTE — Assessment & Plan Note (Signed)
Referred back to Dr Bari Mantis staff to arrange home study and f/u with Dr Elsworth Soho

## 2018-11-12 NOTE — Telephone Encounter (Signed)
Dr. Sarajane Jews please advise on the refill of this medication.  Thanks

## 2018-11-12 NOTE — Telephone Encounter (Signed)
Done

## 2018-11-17 ENCOUNTER — Other Ambulatory Visit: Payer: Self-pay | Admitting: Family Medicine

## 2018-12-22 ENCOUNTER — Other Ambulatory Visit: Payer: Self-pay | Admitting: Family Medicine

## 2019-01-10 ENCOUNTER — Emergency Department (HOSPITAL_COMMUNITY): Payer: BLUE CROSS/BLUE SHIELD

## 2019-01-10 ENCOUNTER — Ambulatory Visit (HOSPITAL_COMMUNITY)
Admission: EM | Admit: 2019-01-10 | Discharge: 2019-01-10 | Disposition: A | Discharge status: Home / Self Care | Payer: BLUE CROSS/BLUE SHIELD

## 2019-01-10 ENCOUNTER — Inpatient Hospital Stay (HOSPITAL_COMMUNITY)
Admission: EM | Admit: 2019-01-10 | Discharge: 2019-01-16 | DRG: 190 | Disposition: A | Discharge status: Home / Self Care | Payer: BLUE CROSS/BLUE SHIELD | Attending: Internal Medicine | Admitting: Internal Medicine

## 2019-01-10 ENCOUNTER — Encounter (HOSPITAL_COMMUNITY): Payer: Self-pay | Admitting: *Deleted

## 2019-01-10 DIAGNOSIS — Z888 Allergy status to other drugs, medicaments and biological substances status: Secondary | ICD-10-CM

## 2019-01-10 DIAGNOSIS — Z885 Allergy status to narcotic agent status: Secondary | ICD-10-CM

## 2019-01-10 DIAGNOSIS — E119 Type 2 diabetes mellitus without complications: Secondary | ICD-10-CM

## 2019-01-10 DIAGNOSIS — Z87892 Personal history of anaphylaxis: Secondary | ICD-10-CM

## 2019-01-10 DIAGNOSIS — Z79899 Other long term (current) drug therapy: Secondary | ICD-10-CM

## 2019-01-10 DIAGNOSIS — T380X5A Adverse effect of glucocorticoids and synthetic analogues, initial encounter: Secondary | ICD-10-CM | POA: Diagnosis present

## 2019-01-10 DIAGNOSIS — Y92009 Unspecified place in unspecified non-institutional (private) residence as the place of occurrence of the external cause: Secondary | ICD-10-CM

## 2019-01-10 DIAGNOSIS — I5032 Chronic diastolic (congestive) heart failure: Secondary | ICD-10-CM | POA: Diagnosis present

## 2019-01-10 DIAGNOSIS — Z79890 Hormone replacement therapy (postmenopausal): Secondary | ICD-10-CM

## 2019-01-10 DIAGNOSIS — Z825 Family history of asthma and other chronic lower respiratory diseases: Secondary | ICD-10-CM

## 2019-01-10 DIAGNOSIS — Z818 Family history of other mental and behavioral disorders: Secondary | ICD-10-CM

## 2019-01-10 DIAGNOSIS — G8929 Other chronic pain: Secondary | ICD-10-CM | POA: Diagnosis present

## 2019-01-10 DIAGNOSIS — G3184 Mild cognitive impairment, so stated: Secondary | ICD-10-CM | POA: Diagnosis present

## 2019-01-10 DIAGNOSIS — Z880 Allergy status to penicillin: Secondary | ICD-10-CM

## 2019-01-10 DIAGNOSIS — J449 Chronic obstructive pulmonary disease, unspecified: Secondary | ICD-10-CM | POA: Diagnosis present

## 2019-01-10 DIAGNOSIS — M1990 Unspecified osteoarthritis, unspecified site: Secondary | ICD-10-CM | POA: Diagnosis present

## 2019-01-10 DIAGNOSIS — E785 Hyperlipidemia, unspecified: Secondary | ICD-10-CM | POA: Diagnosis present

## 2019-01-10 DIAGNOSIS — G4733 Obstructive sleep apnea (adult) (pediatric): Secondary | ICD-10-CM | POA: Diagnosis present

## 2019-01-10 DIAGNOSIS — R0902 Hypoxemia: Secondary | ICD-10-CM

## 2019-01-10 DIAGNOSIS — Z981 Arthrodesis status: Secondary | ICD-10-CM

## 2019-01-10 DIAGNOSIS — Z9114 Patient's other noncompliance with medication regimen: Secondary | ICD-10-CM

## 2019-01-10 DIAGNOSIS — E1165 Type 2 diabetes mellitus with hyperglycemia: Secondary | ICD-10-CM | POA: Diagnosis present

## 2019-01-10 DIAGNOSIS — Z87891 Personal history of nicotine dependence: Secondary | ICD-10-CM

## 2019-01-10 DIAGNOSIS — T501X6A Underdosing of loop [high-ceiling] diuretics, initial encounter: Secondary | ICD-10-CM | POA: Diagnosis present

## 2019-01-10 DIAGNOSIS — E039 Hypothyroidism, unspecified: Secondary | ICD-10-CM | POA: Diagnosis present

## 2019-01-10 DIAGNOSIS — K219 Gastro-esophageal reflux disease without esophagitis: Secondary | ICD-10-CM | POA: Diagnosis present

## 2019-01-10 DIAGNOSIS — J8410 Pulmonary fibrosis, unspecified: Secondary | ICD-10-CM | POA: Diagnosis present

## 2019-01-10 DIAGNOSIS — Z91128 Patient's intentional underdosing of medication regimen for other reason: Secondary | ICD-10-CM

## 2019-01-10 DIAGNOSIS — J441 Chronic obstructive pulmonary disease with (acute) exacerbation: Secondary | ICD-10-CM | POA: Diagnosis not present

## 2019-01-10 DIAGNOSIS — F332 Major depressive disorder, recurrent severe without psychotic features: Secondary | ICD-10-CM | POA: Diagnosis present

## 2019-01-10 DIAGNOSIS — Z794 Long term (current) use of insulin: Secondary | ICD-10-CM

## 2019-01-10 DIAGNOSIS — R45851 Suicidal ideations: Secondary | ICD-10-CM | POA: Diagnosis present

## 2019-01-10 DIAGNOSIS — Z833 Family history of diabetes mellitus: Secondary | ICD-10-CM

## 2019-01-10 DIAGNOSIS — J9601 Acute respiratory failure with hypoxia: Secondary | ICD-10-CM | POA: Diagnosis present

## 2019-01-10 DIAGNOSIS — F419 Anxiety disorder, unspecified: Secondary | ICD-10-CM | POA: Diagnosis present

## 2019-01-10 DIAGNOSIS — Z6841 Body Mass Index (BMI) 40.0 and over, adult: Secondary | ICD-10-CM

## 2019-01-10 HISTORY — DX: Unspecified osteoarthritis, unspecified site: M19.90

## 2019-01-10 HISTORY — DX: Hypothyroidism, unspecified: E03.9

## 2019-01-10 LAB — CBC
HCT: 44 % (ref 36.0–46.0)
HEMOGLOBIN: 12.3 g/dL (ref 12.0–15.0)
MCH: 24.1 pg — ABNORMAL LOW (ref 26.0–34.0)
MCHC: 28 g/dL — AB (ref 30.0–36.0)
MCV: 86.1 fL (ref 80.0–100.0)
Platelets: 157 10*3/uL (ref 150–400)
RBC: 5.11 MIL/uL (ref 3.87–5.11)
RDW: 16.5 % — ABNORMAL HIGH (ref 11.5–15.5)
WBC: 8.7 10*3/uL (ref 4.0–10.5)
nRBC: 0 % (ref 0.0–0.2)

## 2019-01-10 LAB — BASIC METABOLIC PANEL
Anion gap: 8 (ref 5–15)
BUN: 12 mg/dL (ref 8–23)
CHLORIDE: 96 mmol/L — AB (ref 98–111)
CO2: 34 mmol/L — ABNORMAL HIGH (ref 22–32)
Calcium: 9.1 mg/dL (ref 8.9–10.3)
Creatinine, Ser: 0.66 mg/dL (ref 0.44–1.00)
GFR calc non Af Amer: >60 mL/min (ref >60)
Glucose, Bld: 260 mg/dL — ABNORMAL HIGH (ref 70–99)
Potassium: 4.7 mmol/L (ref 3.5–5.1)
Sodium: 138 mmol/L (ref 135–145)

## 2019-01-10 LAB — INFLUENZA PANEL BY PCR (TYPE A & B)
Influenza A By PCR: NEGATIVE
Influenza B By PCR: NEGATIVE

## 2019-01-10 LAB — BRAIN NATRIURETIC PEPTIDE: B Natriuretic Peptide: 36.1 pg/mL (ref 0.0–100.0)

## 2019-01-10 LAB — CBG MONITORING, ED: Glucose-Capillary: 105 mg/dL — ABNORMAL HIGH (ref 70–99)

## 2019-01-10 LAB — TROPONIN I: Troponin I: <0.03 ng/mL (ref <0.03)

## 2019-01-10 MED ORDER — NAPROXEN 250 MG PO TABS
250.0000 mg | ORAL_TABLET | Freq: Two times a day (BID) | ORAL | Status: DC
  Administered 2019-01-10 – 2019-01-12 (×4): 250 mg via ORAL
  Filled 2019-01-10 (×4): qty 1

## 2019-01-10 MED ORDER — ALBUTEROL SULFATE (2.5 MG/3ML) 0.083% IN NEBU
5.0000 mg | INHALATION_SOLUTION | Freq: Once | RESPIRATORY_TRACT | Status: AC
  Administered 2019-01-10: 5 mg via RESPIRATORY_TRACT
  Filled 2019-01-10: qty 6

## 2019-01-10 MED ORDER — METHYLPREDNISOLONE SODIUM SUCC 125 MG IJ SOLR
125.0000 mg | Freq: Once | INTRAMUSCULAR | Status: AC
  Administered 2019-01-10: 125 mg via INTRAVENOUS
  Filled 2019-01-10: qty 2

## 2019-01-10 MED ORDER — IPRATROPIUM-ALBUTEROL 0.5-2.5 (3) MG/3ML IN SOLN
3.0000 mL | Freq: Once | RESPIRATORY_TRACT | Status: AC
  Administered 2019-01-10: 3 mL via RESPIRATORY_TRACT
  Filled 2019-01-10: qty 3

## 2019-01-10 NOTE — ED Triage Notes (Signed)
Pt here for sob, wheezing, o2 is 86% on RA, pt is not on home oxygen. Per [facility] pt needs eval in ER, taking her down now.

## 2019-01-10 NOTE — ED Triage Notes (Signed)
Pt in from UC d/t c/o sob onset x 3 days, pt 86% on RA per UC, pt denies CP, denies N/v/d, pt reports x 3 loose stools in the last 24 hrs, A&O x4

## 2019-01-10 NOTE — ED Notes (Signed)
Pt given water and snacks

## 2019-01-10 NOTE — ED Provider Notes (Signed)
Emergency Department Provider Note   I have reviewed the triage vital signs and the nursing notes.   HISTORY  Chief Complaint Shortness of Breath and Cough   HPI Bonnie Gonzalez is a 63 y.o. female with PMH of DM, GERD, OSA, dCHF, COPD with smoking history and pulmonary fibrosis not on home oxygen presents to the emergency department with worsening shortness of breath with upper respiratory tract infection symptoms.  Patient reports nasal congestion and coughing which is productive.  She has not experienced fever but has noticed sore throat and occasional body aches.  Symptoms been present for the past 3 days.  She has had some diarrhea but no abdominal pain, nausea, vomiting.  She has baseline shortness of breath with exertion but symptoms have significantly worsened over the past 2 days.  She called the nurse line and was referred to urgent care.  She was found to have an oxygen saturation of 86% with dyspnea and referred to the emergency department.  He denies any chest pain or heart palpitations.  Her symptoms are not severe at rest but do remain present.  She was ambulatory here to the bathroom and became very dyspneic.  4 L O2.   Past Medical History:  Diagnosis Date  . Allergy   . Anxiety   . Arthritis   . Depression   . Diabetes mellitus   . GERD (gastroesophageal reflux disease)   . Hypothyroidism   . IBS (irritable bowel syndrome)   . Insomnia     Patient Active Problem List   Diagnosis Date Noted  . Acute respiratory failure with hypoxia ([place]) 01/10/2019  . Postinflammatory pulmonary fibrosis ([place]) 09/15/2018  . COPD GOLD II if use FEV1/VC 09/01/2018  . DOE (dyspnea on exertion) 08/31/2018  . Memory loss 03/31/2018  . Bilateral leg edema 03/31/2018  . Depression with anxiety 01/20/2018  . Type 2 diabetes mellitus without complications ([place]) 29/93/7169  . Back pain 10/18/2015  . OSA (obstructive sleep apnea) 07/26/2015  . Morbid obesity due to excess calories ([place])  05/15/2014  . Vertigo 03/29/2014  . DYSHIDROTIC ECZEMA 12/13/2010  . Hypothyroidism 10/01/2010  . HYPERLIPIDEMIA, MIXED 07/20/2009  . ALLERGIC RHINITIS 07/20/2009  . IRRITABLE BOWEL SYNDROME 07/13/2009  . TOBACCO USE 03/03/2009  . GERD 11/03/2007  . HEADACHE 11/03/2007  . ABDOMINAL PAIN, EPIGASTRIC 11/03/2007    Past Surgical History:  Procedure Laterality Date  . benign tumor      removed from groin  . WRIST ARTHROCENTESIS N/A 2001   Allergies Aspirin; Codeine; Fluoxetine hcl; Hydrocodone-acetaminophen; Oxycodone-acetaminophen; and Penicillins  Family History  Problem Relation Age of Onset  . Asthma Other        fhx  . Depression Other        fhx  . Diabetes Other        fhx  . Parkinsonism Other        fhx  . Lupus Other        fhx  . Alzheimer's disease Father        Deceased, 19  . Diabetes Mellitus II Father   . Arthritis/Rheumatoid Mother        Living, 54  . Diabetes Mellitus II Sister   . Diabetes Mellitus II Brother     Social History Social History   Tobacco Use  . Smoking status: Former Smoker    Packs/day: 1.00    Years: 35.00    Pack years: 35.00    Types: Cigarettes    Last attempt to  quit: 12/01/2008    Years since quitting: 10.1  . Smokeless tobacco: Never Used  Substance Use Topics  . Alcohol use: No    Alcohol/week: 0.0 standard drinks  . Drug use: No    Review of Systems  Constitutional: No fever/chills Eyes: No visual changes. ENT: Positive sore throat. Cardiovascular: Denies chest pain. Respiratory: Positive shortness of breath and productive cough.  Gastrointestinal: No abdominal pain.  No nausea, no vomiting.  No diarrhea.  No constipation. Genitourinary: Negative for dysuria. Musculoskeletal: Negative for back pain. Skin: Negative for rash. Neurological: Negative for headaches, focal weakness or numbness.  10-point ROS otherwise negative.  ____________________________________________   PHYSICAL EXAM:  VITAL  SIGNS: ED Triage Vitals [01/10/19 1405]  Enc Vitals Group     BP (!) 143/49     Pulse Rate 71     Resp 18     Temp 99 F (37.2 C)     Temp Source Oral     SpO2 96 %   Constitutional: Alert and oriented. Well appearing and in no acute distress. Eyes: Conjunctivae are normal.  Head: Atraumatic. Nose: No congestion/rhinnorhea. Mouth/Throat: Mucous membranes are moist.  Oropharynx with mild erythema. No exudate. No PTA.  Neck: No stridor.  Cardiovascular: Normal rate, regular rhythm. Good peripheral circulation. Grossly normal heart sounds.   Respiratory: Increased respiratory effort with exertion.  No retractions. Lungs with moderate end-expiratory wheezing bilaterally.  Gastrointestinal: Soft and nontender. No distention.  Musculoskeletal: No lower extremity tenderness with 1+ pitting edema in the bilateral LEs. No gross deformities of extremities. Neurologic:  Normal speech and language. No gross focal neurologic deficits are appreciated.  Skin:  Skin is warm, dry and intact. No rash noted.  ____________________________________________   LABS (all labs ordered are listed, but only abnormal results are displayed)  Labs Reviewed  CBC - Abnormal; Notable for the following components:      Result Value   MCH 24.1 (*)    MCHC 28.0 (*)    RDW 16.5 (*)    All other components within normal limits  BASIC METABOLIC PANEL - Abnormal; Notable for the following components:   Chloride 96 (*)    CO2 34 (*)    Glucose, Bld 260 (*)    All other components within normal limits  GLUCOSE, CAPILLARY - Abnormal; Notable for the following components:   Glucose-Capillary 338 (*)    All other components within normal limits  BASIC METABOLIC PANEL - Abnormal; Notable for the following components:   Chloride 93 (*)    Glucose, Bld 410 (*)    All other components within normal limits  CBC - Abnormal; Notable for the following components:   WBC 10.7 (*)    RBC 5.18 (*)    MCH 24.1 (*)    MCHC  28.7 (*)    RDW 16.7 (*)    All other components within normal limits  D-DIMER, QUANTITATIVE (NOT AT [facility]) - Abnormal; Notable for the following components:   D-Dimer, Quant 0.62 (*)    All other components within normal limits  HEMOGLOBIN A1C - Abnormal; Notable for the following components:   Hgb A1c MFr Bld 10.0 (*)    All other components within normal limits  GLUCOSE, CAPILLARY - Abnormal; Notable for the following components:   Glucose-Capillary 363 (*)    All other components within normal limits  CBG MONITORING, ED - Abnormal; Notable for the following components:   Glucose-Capillary 105 (*)    All other components within normal limits  TROPONIN I  BRAIN NATRIURETIC PEPTIDE  INFLUENZA PANEL BY PCR (TYPE A & B)  HIV ANTIBODY (ROUTINE TESTING W REFLEX)   ____________________________________________  EKG   EKG Interpretation  Date/Time:  Monday January 10 2019 14:17:21 EST Ventricular Rate:  74 PR Interval:  174 QRS Duration: 70 QT Interval:  390 QTC Calculation: 432 R Axis:   27 Text Interpretation:  Normal sinus rhythm Anteroseptal infarct , age undetermined Abnormal ECG No STEMI.  Confirmed by Nanda Quinton [phone]) on 01/10/2019 4:13:53 PM       ____________________________________________  RADIOLOGY  Dg Chest 2 View  Result Date: 01/10/2019 CLINICAL DATA:  Productive cough, fever and sore throat for 3 days. EXAM: CHEST - 2 VIEW COMPARISON:  Chest CT 09/14/2018 FINDINGS: Mildly enlarged cardiac silhouette. Calcific atherosclerotic disease of the aorta. Mediastinal contours appear intact. There is no evidence of focal airspace consolidation, pleural effusion or pneumothorax. Osseous structures are without acute abnormality. Soft tissues are grossly normal. IMPRESSION: No active pulmonary disease. Mildly enlarged cardiac silhouette. Calcific atherosclerotic disease of the aorta. Electronically Signed   By: Fidela Salisbury M.D.   On: 01/10/2019 15:00     ____________________________________________   PROCEDURES  Procedure(s) performed:   Procedures  CRITICAL CARE Performed by: Margette Fast Total critical care time: 35 minutes Critical care time was exclusive of separately billable procedures and treating other patients. Critical care was necessary to treat or prevent imminent or life-threatening deterioration. Critical care was time spent personally by me on the following activities: development of treatment plan with patient and/or surrogate as well as nursing, discussions with consultants, evaluation of patient's response to treatment, examination of patient, obtaining history from patient or surrogate, ordering and performing treatments and interventions, ordering and review of laboratory studies, ordering and review of radiographic studies, pulse oximetry and re-evaluation of patient's condition.  Nanda Quinton, MD Emergency Medicine  ____________________________________________   INITIAL IMPRESSION / ASSESSMENT AND PLAN / ED COURSE  Pertinent labs & imaging results that were available during my care of the patient were reviewed by me and considered in my medical decision making (see chart for details).  Patient presents to the emergency department for evaluation of shortness of breath with hypoxemia.  She has mild wheezing.  She has a recent note in epic from her pulmonology visit.  She has Oday Ridings smoking history, pulmonary fibrosis, and diastolic CHF.  She does not appear acutely volume overloaded.  Her chest x-ray was reviewed which shows no active disease with mildly enlarged cardiac silhouette.  No filtrate.  Patient is afebrile.  Doubt sepsis.  Very low suspicion for acute coronary syndrome or PE with her wheezing and hypoxia.  Patient with likely COPD exacerbated by upper respiratory infection.  Sending flu and will follow-up after breathing treatments and try to wean O2 if possible.   CXR and labs reviewed. Mild  improvement with nebs. Plan for admit given hypoxemia.   Discussed patient's case with Hospitalist to request admission. Patient and family (if present) updated with plan. Care transferred to Hospitalist service.  I reviewed all nursing notes, vitals, pertinent old records, EKGs, labs, imaging (as available).  ____________________________________________  FINAL CLINICAL IMPRESSION(S) / ED DIAGNOSES  Final diagnoses:  COPD exacerbation ([place])  Hypoxemia     MEDICATIONS GIVEN DURING THIS VISIT:  Medications  naproxen (NAPROSYN) tablet 250 mg (250 mg Oral Given 01/10/19 2342)  atorvastatin (LIPITOR) tablet 40 mg (40 mg Oral Given 01/11/19 0250)  ALPRAZolam (XANAX) tablet 0.5-1 mg (1 mg Oral Given 01/11/19  0248)  donepezil (ARICEPT) tablet 10 mg (10 mg Oral Given 01/11/19 0250)  DULoxetine (CYMBALTA) DR capsule 60 mg (60 mg Oral Given 01/11/19 0250)  temazepam (RESTORIL) capsule 30 mg (has no administration in time range)  levothyroxine (SYNTHROID, LEVOTHROID) tablet 175 mcg (175 mcg Oral Given 01/11/19 0615)  pantoprazole (PROTONIX) EC tablet 40 mg (has no administration in time range)  methocarbamol (ROBAXIN) tablet 750 mg (has no administration in time range)  acetaminophen (TYLENOL) tablet 650 mg (has no administration in time range)    Or  acetaminophen (TYLENOL) suppository 650 mg (has no administration in time range)  ondansetron (ZOFRAN) tablet 4 mg (has no administration in time range)    Or  ondansetron (ZOFRAN) injection 4 mg (has no administration in time range)  enoxaparin (LOVENOX) injection 60 mg (has no administration in time range)  albuterol (PROVENTIL) (2.5 MG/3ML) 0.083% nebulizer solution 2.5 mg (has no administration in time range)  insulin NPH Human (HUMULIN N,NOVOLIN N) injection 200 Units (200 Units Subcutaneous Given 01/11/19 0252)  methylPREDNISolone sodium succinate (SOLU-MEDROL) 40 mg/mL injection 40 mg (has no administration in time range)  albuterol  (PROVENTIL) (2.5 MG/3ML) 0.083% nebulizer solution 2.5 mg (2.5 mg Nebulization Given 01/11/19 0754)  ipratropium (ATROVENT) nebulizer solution 0.5 mg (0.5 mg Nebulization Given 01/11/19 0754)  MEDLINE mouth rinse (has no administration in time range)  insulin aspart (novoLOG) injection 0-20 Units (has no administration in time range)  albuterol (PROVENTIL) (2.5 MG/3ML) 0.083% nebulizer solution 5 mg (5 mg Nebulization Given 01/10/19 1702)  ipratropium-albuterol (DUONEB) 0.5-2.5 (3) MG/3ML nebulizer solution 3 mL (3 mLs Nebulization Given 01/10/19 1842)  methylPREDNISolone sodium succinate (SOLU-MEDROL) 125 mg/2 mL injection 125 mg (125 mg Intravenous Given 01/10/19 1926)    Note:  This document was prepared using Dragon voice recognition software and may include unintentional dictation errors.  Nanda Quinton, MD Emergency Medicine    Maryum Batterson, [facility], MD 01/11/19 [phone]

## 2019-01-10 NOTE — ED Notes (Signed)
Call Harrison Mons (patient's daughter) when patient has a bed assignment. [phone]

## 2019-01-11 ENCOUNTER — Observation Stay (HOSPITAL_COMMUNITY): Payer: BLUE CROSS/BLUE SHIELD

## 2019-01-11 ENCOUNTER — Encounter (HOSPITAL_COMMUNITY): Payer: Self-pay | Admitting: Internal Medicine

## 2019-01-11 ENCOUNTER — Other Ambulatory Visit: Payer: Self-pay

## 2019-01-11 ENCOUNTER — Inpatient Hospital Stay (HOSPITAL_COMMUNITY): Payer: BLUE CROSS/BLUE SHIELD

## 2019-01-11 DIAGNOSIS — R0602 Shortness of breath: Secondary | ICD-10-CM | POA: Diagnosis not present

## 2019-01-11 DIAGNOSIS — J441 Chronic obstructive pulmonary disease with (acute) exacerbation: Principal | ICD-10-CM

## 2019-01-11 DIAGNOSIS — G3184 Mild cognitive impairment, so stated: Secondary | ICD-10-CM | POA: Diagnosis present

## 2019-01-11 DIAGNOSIS — J449 Chronic obstructive pulmonary disease, unspecified: Secondary | ICD-10-CM | POA: Diagnosis not present

## 2019-01-11 DIAGNOSIS — J8410 Pulmonary fibrosis, unspecified: Secondary | ICD-10-CM | POA: Diagnosis present

## 2019-01-11 DIAGNOSIS — Z794 Long term (current) use of insulin: Secondary | ICD-10-CM

## 2019-01-11 DIAGNOSIS — T501X6A Underdosing of loop [high-ceiling] diuretics, initial encounter: Secondary | ICD-10-CM | POA: Diagnosis present

## 2019-01-11 DIAGNOSIS — Y92009 Unspecified place in unspecified non-institutional (private) residence as the place of occurrence of the external cause: Secondary | ICD-10-CM | POA: Diagnosis not present

## 2019-01-11 DIAGNOSIS — G8929 Other chronic pain: Secondary | ICD-10-CM | POA: Diagnosis present

## 2019-01-11 DIAGNOSIS — Z79890 Hormone replacement therapy (postmenopausal): Secondary | ICD-10-CM | POA: Diagnosis not present

## 2019-01-11 DIAGNOSIS — R0902 Hypoxemia: Secondary | ICD-10-CM | POA: Diagnosis present

## 2019-01-11 DIAGNOSIS — E785 Hyperlipidemia, unspecified: Secondary | ICD-10-CM | POA: Diagnosis present

## 2019-01-11 DIAGNOSIS — Z9114 Patient's other noncompliance with medication regimen: Secondary | ICD-10-CM | POA: Diagnosis not present

## 2019-01-11 DIAGNOSIS — G4733 Obstructive sleep apnea (adult) (pediatric): Secondary | ICD-10-CM

## 2019-01-11 DIAGNOSIS — Z91128 Patient's intentional underdosing of medication regimen for other reason: Secondary | ICD-10-CM | POA: Diagnosis not present

## 2019-01-11 DIAGNOSIS — T380X5A Adverse effect of glucocorticoids and synthetic analogues, initial encounter: Secondary | ICD-10-CM | POA: Diagnosis present

## 2019-01-11 DIAGNOSIS — E119 Type 2 diabetes mellitus without complications: Secondary | ICD-10-CM | POA: Diagnosis not present

## 2019-01-11 DIAGNOSIS — E039 Hypothyroidism, unspecified: Secondary | ICD-10-CM | POA: Diagnosis present

## 2019-01-11 DIAGNOSIS — Z6841 Body Mass Index (BMI) 40.0 and over, adult: Secondary | ICD-10-CM | POA: Diagnosis not present

## 2019-01-11 DIAGNOSIS — I5032 Chronic diastolic (congestive) heart failure: Secondary | ICD-10-CM | POA: Diagnosis present

## 2019-01-11 DIAGNOSIS — F332 Major depressive disorder, recurrent severe without psychotic features: Secondary | ICD-10-CM | POA: Diagnosis present

## 2019-01-11 DIAGNOSIS — M1990 Unspecified osteoarthritis, unspecified site: Secondary | ICD-10-CM | POA: Diagnosis present

## 2019-01-11 DIAGNOSIS — Z981 Arthrodesis status: Secondary | ICD-10-CM | POA: Diagnosis not present

## 2019-01-11 DIAGNOSIS — R45851 Suicidal ideations: Secondary | ICD-10-CM | POA: Diagnosis present

## 2019-01-11 DIAGNOSIS — J9601 Acute respiratory failure with hypoxia: Secondary | ICD-10-CM | POA: Diagnosis present

## 2019-01-11 DIAGNOSIS — E1165 Type 2 diabetes mellitus with hyperglycemia: Secondary | ICD-10-CM | POA: Diagnosis present

## 2019-01-11 DIAGNOSIS — K219 Gastro-esophageal reflux disease without esophagitis: Secondary | ICD-10-CM | POA: Diagnosis present

## 2019-01-11 DIAGNOSIS — F419 Anxiety disorder, unspecified: Secondary | ICD-10-CM | POA: Diagnosis present

## 2019-01-11 LAB — BASIC METABOLIC PANEL
Anion gap: 14 (ref 5–15)
BUN: 13 mg/dL (ref 8–23)
CO2: 28 mmol/L (ref 22–32)
Calcium: 9.4 mg/dL (ref 8.9–10.3)
Chloride: 93 mmol/L — ABNORMAL LOW (ref 98–111)
Creatinine, Ser: 0.79 mg/dL (ref 0.44–1.00)
GFR calc Af Amer: >60 mL/min (ref >60)
GFR calc non Af Amer: >60 mL/min (ref >60)
Glucose, Bld: 410 mg/dL — ABNORMAL HIGH (ref 70–99)
POTASSIUM: 4.9 mmol/L (ref 3.5–5.1)
Sodium: 135 mmol/L (ref 135–145)

## 2019-01-11 LAB — GLUCOSE, CAPILLARY
GLUCOSE-CAPILLARY: 410 mg/dL — AB (ref 70–99)
Glucose-Capillary: 338 mg/dL — ABNORMAL HIGH (ref 70–99)
Glucose-Capillary: 363 mg/dL — ABNORMAL HIGH (ref 70–99)
Glucose-Capillary: 372 mg/dL — ABNORMAL HIGH (ref 70–99)
Glucose-Capillary: 354 mg/dL — ABNORMAL HIGH (ref 70–99)

## 2019-01-11 LAB — ECHOCARDIOGRAM COMPLETE
Height: 60 in
Weight: 4398.62 oz

## 2019-01-11 LAB — CBC
HCT: 43.6 % (ref 36.0–46.0)
Hemoglobin: 12.5 g/dL (ref 12.0–15.0)
MCH: 24.1 pg — ABNORMAL LOW (ref 26.0–34.0)
MCHC: 28.7 g/dL — ABNORMAL LOW (ref 30.0–36.0)
MCV: 84.2 fL (ref 80.0–100.0)
Platelets: 173 10*3/uL (ref 150–400)
RBC: 5.18 MIL/uL — ABNORMAL HIGH (ref 3.87–5.11)
RDW: 16.7 % — ABNORMAL HIGH (ref 11.5–15.5)
WBC: 10.7 10*3/uL — ABNORMAL HIGH (ref 4.0–10.5)
nRBC: 0.2 % (ref 0.0–0.2)

## 2019-01-11 LAB — TROPONIN I
Troponin I: <0.03 ng/mL (ref <0.03)
Troponin I: <0.03 ng/mL (ref <0.03)

## 2019-01-11 LAB — D-DIMER, QUANTITATIVE (NOT AT ARMC): D DIMER QUANT: 0.62 ug{FEU}/mL — AB (ref 0.00–0.50)

## 2019-01-11 LAB — HEMOGLOBIN A1C
Hgb A1c MFr Bld: 10 % — ABNORMAL HIGH (ref 4.8–5.6)
MEAN PLASMA GLUCOSE: 240.3 mg/dL

## 2019-01-11 LAB — HIV ANTIBODY (ROUTINE TESTING W REFLEX): HIV Screen 4th Generation wRfx: NONREACTIVE

## 2019-01-11 MED ORDER — ONDANSETRON HCL 4 MG/2ML IJ SOLN: 4.0000 mg | Freq: Four times a day (QID) | INTRAMUSCULAR | Status: DC | PRN

## 2019-01-11 MED ORDER — ACETAMINOPHEN 650 MG RE SUPP: 650.0000 mg | Freq: Four times a day (QID) | RECTAL | Status: DC | PRN

## 2019-01-11 MED ORDER — IPRATROPIUM BROMIDE 0.02 % IN SOLN: 0.5000 mg | RESPIRATORY_TRACT | Status: DC

## 2019-01-11 MED ORDER — ALBUTEROL SULFATE (2.5 MG/3ML) 0.083% IN NEBU
2.5000 mg | INHALATION_SOLUTION | RESPIRATORY_TRACT | Status: DC
  Administered 2019-01-11 (×2): 2.5 mg via RESPIRATORY_TRACT
  Filled 2019-01-11 (×2): qty 3

## 2019-01-11 MED ORDER — ALBUTEROL SULFATE (2.5 MG/3ML) 0.083% IN NEBU: 2.5000 mg | INHALATION_SOLUTION | RESPIRATORY_TRACT | Status: DC | PRN

## 2019-01-11 MED ORDER — TEMAZEPAM 7.5 MG PO CAPS: 30.0000 mg | ORAL_CAPSULE | Freq: Every evening | ORAL | Status: DC | PRN

## 2019-01-11 MED ORDER — METHOCARBAMOL 500 MG PO TABS: 750.0000 mg | ORAL_TABLET | Freq: Four times a day (QID) | ORAL | Status: DC | PRN

## 2019-01-11 MED ORDER — ONDANSETRON HCL 4 MG PO TABS: 4.0000 mg | ORAL_TABLET | Freq: Four times a day (QID) | ORAL | Status: DC | PRN

## 2019-01-11 MED ORDER — METHYLPREDNISOLONE SODIUM SUCC 40 MG IJ SOLR
40.0000 mg | Freq: Four times a day (QID) | INTRAMUSCULAR | Status: AC
  Administered 2019-01-11 – 2019-01-12 (×4): 40 mg via INTRAVENOUS
  Filled 2019-01-11 (×4): qty 1

## 2019-01-11 MED ORDER — ALBUTEROL SULFATE (2.5 MG/3ML) 0.083% IN NEBU: 2.5000 mg | INHALATION_SOLUTION | RESPIRATORY_TRACT | Status: DC

## 2019-01-11 MED ORDER — PANTOPRAZOLE SODIUM 40 MG PO TBEC
40.0000 mg | DELAYED_RELEASE_TABLET | Freq: Every day | ORAL | Status: DC
  Administered 2019-01-11 – 2019-01-16 (×6): 40 mg via ORAL
  Filled 2019-01-11 (×6): qty 1

## 2019-01-11 MED ORDER — ALBUTEROL SULFATE (2.5 MG/3ML) 0.083% IN NEBU
2.5000 mg | INHALATION_SOLUTION | Freq: Four times a day (QID) | RESPIRATORY_TRACT | Status: DC
  Administered 2019-01-12: 2.5 mg via RESPIRATORY_TRACT
  Filled 2019-01-11: qty 3

## 2019-01-11 MED ORDER — BENZONATATE 100 MG PO CAPS
100.0000 mg | ORAL_CAPSULE | Freq: Three times a day (TID) | ORAL | Status: DC | PRN
  Administered 2019-01-12: 100 mg via ORAL
  Filled 2019-01-11: qty 1

## 2019-01-11 MED ORDER — INSULIN ASPART 100 UNIT/ML ~~LOC~~ SOLN
5.0000 [IU] | Freq: Once | SUBCUTANEOUS | Status: AC
  Administered 2019-01-11: 5 [IU] via SUBCUTANEOUS

## 2019-01-11 MED ORDER — INSULIN NPH (HUMAN) (ISOPHANE) 100 UNIT/ML ~~LOC~~ SUSP
100.0000 [IU] | Freq: Two times a day (BID) | SUBCUTANEOUS | Status: DC
  Administered 2019-01-12: 100 [IU] via SUBCUTANEOUS
  Filled 2019-01-11: qty 10

## 2019-01-11 MED ORDER — DULOXETINE HCL 60 MG PO CPEP
60.0000 mg | ORAL_CAPSULE | Freq: Every day | ORAL | Status: DC
  Administered 2019-01-11 – 2019-01-15 (×6): 60 mg via ORAL
  Filled 2019-01-11 (×6): qty 1

## 2019-01-11 MED ORDER — INSULIN ASPART 100 UNIT/ML ~~LOC~~ SOLN
0.0000 [IU] | Freq: Three times a day (TID) | SUBCUTANEOUS | Status: DC
  Administered 2019-01-12: 10 [IU] via SUBCUTANEOUS
  Administered 2019-01-12: 11 [IU] via SUBCUTANEOUS
  Administered 2019-01-12: 20 [IU] via SUBCUTANEOUS
  Administered 2019-01-13 – 2019-01-14 (×2): 7 [IU] via SUBCUTANEOUS
  Administered 2019-01-14 (×2): 3 [IU] via SUBCUTANEOUS
  Administered 2019-01-15 – 2019-01-16 (×3): 4 [IU] via SUBCUTANEOUS
  Administered 2019-01-16: 7 [IU] via SUBCUTANEOUS

## 2019-01-11 MED ORDER — ALPRAZOLAM 0.5 MG PO TABS
0.5000 mg | ORAL_TABLET | Freq: Two times a day (BID) | ORAL | Status: DC
  Administered 2019-01-11 – 2019-01-16 (×12): 1 mg via ORAL
  Filled 2019-01-11 (×12): qty 2

## 2019-01-11 MED ORDER — INSULIN ASPART 100 UNIT/ML ~~LOC~~ SOLN
0.0000 [IU] | Freq: Three times a day (TID) | SUBCUTANEOUS | Status: DC
  Administered 2019-01-11 (×2): 20 [IU] via SUBCUTANEOUS

## 2019-01-11 MED ORDER — ALBUTEROL SULFATE (2.5 MG/3ML) 0.083% IN NEBU
2.5000 mg | INHALATION_SOLUTION | RESPIRATORY_TRACT | Status: DC
  Administered 2019-01-11 (×2): 2.5 mg via RESPIRATORY_TRACT
  Filled 2019-01-11 (×3): qty 3

## 2019-01-11 MED ORDER — ATORVASTATIN CALCIUM 40 MG PO TABS
40.0000 mg | ORAL_TABLET | Freq: Every day | ORAL | Status: DC
  Administered 2019-01-11 – 2019-01-15 (×6): 40 mg via ORAL
  Filled 2019-01-11 (×6): qty 1

## 2019-01-11 MED ORDER — INSULIN NPH (HUMAN) (ISOPHANE) 100 UNIT/ML ~~LOC~~ SUSP
100.0000 [IU] | Freq: Once | SUBCUTANEOUS | Status: AC
  Administered 2019-01-11: 100 [IU] via SUBCUTANEOUS
  Filled 2019-01-11: qty 10

## 2019-01-11 MED ORDER — DONEPEZIL HCL 10 MG PO TABS
10.0000 mg | ORAL_TABLET | Freq: Every day | ORAL | Status: DC
  Administered 2019-01-11 – 2019-01-15 (×6): 10 mg via ORAL
  Filled 2019-01-11 (×6): qty 1

## 2019-01-11 MED ORDER — ENOXAPARIN SODIUM 60 MG/0.6ML ~~LOC~~ SOLN
60.0000 mg | SUBCUTANEOUS | Status: DC
  Administered 2019-01-11 – 2019-01-16 (×6): 60 mg via SUBCUTANEOUS
  Filled 2019-01-11 (×7): qty 0.6

## 2019-01-11 MED ORDER — IOPAMIDOL (ISOVUE-370) INJECTION 76%
75.0000 mL | Freq: Once | INTRAVENOUS | Status: AC | PRN
  Administered 2019-01-11: 75 mL via INTRAVENOUS

## 2019-01-11 MED ORDER — IPRATROPIUM BROMIDE 0.02 % IN SOLN
0.5000 mg | RESPIRATORY_TRACT | Status: DC
  Administered 2019-01-11 (×2): 0.5 mg via RESPIRATORY_TRACT
  Filled 2019-01-11 (×2): qty 2.5

## 2019-01-11 MED ORDER — INSULIN NPH (HUMAN) (ISOPHANE) 100 UNIT/ML ~~LOC~~ SUSP: 100.0000 [IU] | Freq: Two times a day (BID) | SUBCUTANEOUS | Status: DC

## 2019-01-11 MED ORDER — ORAL CARE MOUTH RINSE
15.0000 mL | Freq: Two times a day (BID) | OROMUCOSAL | Status: DC
  Administered 2019-01-11 – 2019-01-15 (×8): 15 mL via OROMUCOSAL

## 2019-01-11 MED ORDER — INSULIN NPH (HUMAN) (ISOPHANE) 100 UNIT/ML ~~LOC~~ SUSP
200.0000 [IU] | Freq: Two times a day (BID) | SUBCUTANEOUS | Status: DC
  Administered 2019-01-11: 200 [IU] via SUBCUTANEOUS
  Filled 2019-01-11: qty 10

## 2019-01-11 MED ORDER — METHYLPREDNISOLONE SODIUM SUCC 40 MG IJ SOLR
40.0000 mg | Freq: Two times a day (BID) | INTRAMUSCULAR | Status: DC
  Administered 2019-01-11: 40 mg via INTRAVENOUS
  Filled 2019-01-11: qty 1

## 2019-01-11 MED ORDER — IPRATROPIUM-ALBUTEROL 0.5-2.5 (3) MG/3ML IN SOLN: 3.0000 mL | Freq: Two times a day (BID) | RESPIRATORY_TRACT | Status: DC

## 2019-01-11 MED ORDER — INSULIN ASPART 100 UNIT/ML ~~LOC~~ SOLN
0.0000 [IU] | Freq: Every day | SUBCUTANEOUS | Status: DC
  Administered 2019-01-12 – 2019-01-15 (×2): 2 [IU] via SUBCUTANEOUS

## 2019-01-11 MED ORDER — INSULIN ASPART 100 UNIT/ML ~~LOC~~ SOLN
10.0000 [IU] | Freq: Three times a day (TID) | SUBCUTANEOUS | Status: DC
  Administered 2019-01-11 – 2019-01-12 (×3): 10 [IU] via SUBCUTANEOUS

## 2019-01-11 MED ORDER — LEVOTHYROXINE SODIUM 75 MCG PO TABS
175.0000 ug | ORAL_TABLET | Freq: Every day | ORAL | Status: DC
  Administered 2019-01-11 – 2019-01-16 (×6): 175 ug via ORAL
  Filled 2019-01-11 (×6): qty 1

## 2019-01-11 MED ORDER — ACETAMINOPHEN 325 MG PO TABS: 650.0000 mg | ORAL_TABLET | Freq: Four times a day (QID) | ORAL | Status: DC | PRN

## 2019-01-11 MED ORDER — INSULIN ASPART 100 UNIT/ML ~~LOC~~ SOLN
0.0000 [IU] | Freq: Three times a day (TID) | SUBCUTANEOUS | Status: DC
  Administered 2019-01-11: 15 [IU] via SUBCUTANEOUS

## 2019-01-11 NOTE — Progress Notes (Addendum)
Inpatient Diabetes Program Recommendations  AACE/ADA: New Consensus Statement on Inpatient Glycemic Control (2015)  Target Ranges:  Prepandial:   less than 140 mg/dL      Peak postprandial:   less than 180 mg/dL (1-2 hours)      Critically ill patients:  140 - 180 mg/dL   Lab Results  Component Value Date   GLUCAP 363 (H) 01/11/2019   HGBA1C 10.0 (H) 01/11/2019    Review of Glycemic Control Results for IVEY, NEMBHARD (MRN [id number]) as of 01/11/2019 09:30  Ref. Range 01/11/2019 01:28 01/11/2019 07:52  Glucose-Capillary Latest Ref Range: 70 - 99 mg/dL 338 (H) 363 (H)   Diabetes history: Type 2 DM Outpatient Diabetes medications: NPH- 350 units QAM, 30 units QPM Current orders for Inpatient glycemic control: NPH 200 units BID, Novolog 0-20 units TID  Solumedrol 125 mg x1 on 01/10/2019, current 40 mg Q12H  Inpatient Diabetes Program Recommendations:    Noted last saw Dr Loanne Drilling, endocrinolgist on 03/17/2018.  Of note, patient has recently received Prednisone steroid taper, thus further contributing to increased A1C.  At this time recommending:  -Novolog 10 units TID  -NPH 100 units with dinner  Addendum @1430 : Spoke with patient at length regarding outpatient diabetes management. Patient has been followed by Dr Loanne Drilling for the last 7 years outpatient. Patient reports never missing dosages, however when asked doses for NPH patient states, "Well it depends and varies from day to day. One day, I may take 350 units in the morning, other days I may only take 75 units QAM. Patient admits that she has attempted to control her diabetes and has recently had a hard time. During our conversation, patient becomes very tearful making discernment of what she is actually doing at home very difficult. Of note patient has several areas of scar tissue from injections on abdomen.  Reviewed patient's current A1c of 10% up from 9.1% from 03/2018. Explained what a A1c is and what it measures. Also reviewed goal  A1c with patient, importance of good glucose control @ home, and blood sugar goals. Reviewed patho of DM, need for insulin, relationship between insulin, weight gain, hunger and lack of control, impact of steroids to blood glucose levels, signs and symptoms of hyperglycemia, NPH, vascular changes and comorbidites.  Patient does check blood sugars, however her reports of frequency varied. When asked specific questions patient could not recall, especially following meals. Reports CBGs are higher in the morning and before dinner. Education provided on action, peak and benefits of NPH. Patient is making adjustments based on her highs. Denies having lows. Has hypoglycemia awareness at 110 mg/dL, indicating poor control. Discussed the option of the [employer]. Reviewed cost, benefits, censor application and how to use the device as a tool. Feel that this may help this patient. Encouraged to begin checking at least 3-4 times per day, focusing on FSBS, before and after meals, and at bedtime. Patient denies drinking sugary beverages, such as soda or juice. Admits to consuming larger portions of fruit than she should. Has many questions related to diet and carb coutning. Educated basic prinicple of toal carbohydrates, gave examples of better snack options and amount that would be more sutable for DM diet. Reviewed goal setting and provided handout for setting goals and establishing a follow up with an endocrinologist in her new insurance's network. Patient plans to make an appointment and follow up.  Patient encouraged to write down further questions.  Case managment, [facility], and outpatient referral placed.  Will continue  to follow.  Thanks, Bronson Curb, MSN, RNC-OB Diabetes Coordinator [phone] ([phone]-[phone])

## 2019-01-11 NOTE — H&P (Signed)
History and Physical    Bonnie Gonzalez QBH:[id number] DOB: 1956/04/21 DOA: 01/10/2019  PCP: Laurey Morale, MD  Patient coming from: Home.  Chief Complaint: Shortness of breath.  HPI: Bonnie Gonzalez is a 63 y.o. female with history of COPD sleep apnea, hypothyroidism, diabetes mellitus, obesity and diastolic dysfunction presents to the ER because of shortness of breath.  Patient has been having shortness of breath with wheezing and cough for last 3 days.  Had mild left-sided chest pain below the left breast when coughing.  Otherwise chest pain-free.  Denies fever chills.  ED Course: In the ER patient is found to be wheezing chest x-ray was unremarkable.  Flu panel was negative.  BNP 36 troponin negative EKG shows normal sinus rhythm.  Blood sugar was 260.  Patient was requiring 4 L oxygen and being admitted for acute COPD exacerbation.  Review of Systems: As per HPI, rest all negative.   Past Medical History:  Diagnosis Date  . Allergy   . Anxiety   . Depression   . Diabetes mellitus   . GERD (gastroesophageal reflux disease)   . IBS (irritable bowel syndrome)   . Insomnia   . Tuberculosis    hydrothyroidism    Past Surgical History:  Procedure Laterality Date  . benign tumor      removed from groin  . WRIST ARTHROCENTESIS N/A 2001     reports that she quit smoking about 10 years ago. Her smoking use included cigarettes. She has a 35.00 pack-year smoking history. She has never used smokeless tobacco. She reports that she does not drink alcohol or use drugs.  Allergies  Allergen Reactions  . Aspirin Anaphylaxis    Mother had to be resuscitated after ingesting this  . Codeine Hives  . Fluoxetine Hcl Itching and Other (See Comments)    "Hair itching"  . Hydrocodone-Acetaminophen Hives  . Oxycodone-Acetaminophen Hives  . Penicillins Hives    Did it involve swelling of the face/tongue/throat, SOB, or low BP? Yes-hives Did it involve sudden or severe rash/hives, skin  peeling, or any reaction on the inside of your mouth or nose? Unk Did you need to seek medical attention at a hospital or doctor's office? Yes When did it last happen? Was in her 27's If all above answers are "NO", may proceed with cephalosporin use.     Family History  Problem Relation Age of Onset  . Asthma Other        fhx  . Depression Other        fhx  . Diabetes Other        fhx  . Parkinsonism Other        fhx  . Lupus Other        fhx  . Alzheimer's disease Father        Deceased, 17  . Diabetes Mellitus II Father   . Arthritis/Rheumatoid Mother        Living, 90  . Diabetes Mellitus II Sister   . Diabetes Mellitus II Brother     Prior to Admission medications   Medication Sig Start Date End Date Taking? Authorizing Provider  albuterol (PROVENTIL HFA;VENTOLIN HFA) 108 (90 Base) MCG/ACT inhaler Inhale 2 puffs into the lungs every 4 (four) hours as needed for wheezing or shortness of breath. 06/17/18  Yes Laurey Morale, MD  ALPRAZolam Duanne Moron) 0.5 MG tablet TAKE 1 TABLET (0.5 MG TOTAL) BY MOUTH 2 (TWO) TIMES DAILY AS NEEDED. Patient taking differently: Take 0.5-1 mg  by mouth 2 (two) times daily.  07/21/18  Yes Laurey Morale, MD  atorvastatin (LIPITOR) 40 MG tablet Take 1 tablet (40 mg total) by mouth daily. Patient taking differently: Take 40 mg by mouth at bedtime.  01/19/18  Yes Laurey Morale, MD  benzonatate (TESSALON) 200 MG capsule Take 1 capsule (200 mg total) by mouth 2 (two) times daily as needed for cough. 03/09/18  Yes Laurey Morale, MD  donepezil (ARICEPT) 10 MG tablet TAKE 1 TABLET BY MOUTH AT BEDTIME Patient taking differently: Take 10 mg by mouth at bedtime.  12/23/18  Yes Laurey Morale, MD  DULoxetine (CYMBALTA) 60 MG capsule Take 1 capsule (60 mg total) by mouth daily. Patient taking differently: Take 60 mg by mouth at bedtime.  01/19/18  Yes Laurey Morale, MD  esomeprazole (NEXIUM) 40 MG capsule Take 30- 60 min before your first and last meals of the  day Patient taking differently: Take 40 mg by mouth daily before breakfast.  09/28/18  Yes Tanda Rockers, MD  furosemide (LASIX) 40 MG tablet TAKE 1 TABLET BY MOUTH ONCE DAILY Patient taking differently: Take 40 mg by mouth daily.  10/25/18  Yes Laurey Morale, MD  halobetasol (ULTRAVATE) 0.05 % cream Apply topically 2 (two) times daily as needed. Patient taking differently: Apply 1 application topically 2 (two) times daily as needed (for itching).  06/04/18  Yes Laurey Morale, MD  hydrOXYzine (VISTARIL) 25 MG capsule TAKE 1 CAPSULE BY MOUTH THREE TIMES A DAY AS NEEDED Patient taking differently: Take 25 mg by mouth See admin instructions. Take 25 mg by mouth 2 times a day and an additional 25 mg as needed for itching 08/25/18  Yes Laurey Morale, MD  Hyprom-Naphaz-Polysorb-Zn Sulf (CLEAR EYES COMPLETE) SOLN Place 1-2 drops into both eyes as needed (for itching).   Yes [provider]  insulin NPH Human (NOVOLIN N) 100 UNIT/ML injection 350 Units every morning, and 30 units each evening Patient taking differently: Inject 30-250 Units into the skin. Inject 200-350 units into the skin before breakfast and 30-80 units after supper 10/06/18  Yes Philemon Kingdom, MD  levothyroxine (SYNTHROID, LEVOTHROID) 175 MCG tablet TAKE 1 TABLET BY MOUTH ONCE DAILY BEFORE BREAKFAST Patient taking differently: Take 175 mcg by mouth daily before breakfast.  03/31/18  Yes Laurey Morale, MD  meclizine (ANTIVERT) 50 MG tablet Take 0.5 tablets (25 mg total) by mouth 3 (three) times daily as needed. Patient taking differently: Take 25 mg by mouth 3 (three) times daily as needed for dizziness.  08/31/14  Yes Daniel Nones, [place], PA-C  methocarbamol (ROBAXIN) 750 MG tablet Take 1 tablet (750 mg total) by mouth every 6 (six) hours as needed for muscle spasms. 07/28/18  Yes Laurey Morale, MD  naproxen (NAPROSYN) 500 MG tablet Take 500 mg by mouth 2 (two) times daily.  07/20/18  Yes [provider]  temazepam  (RESTORIL) 30 MG capsule TAKE ONE CAPSULE BY MOUTH EVERY NIGHT AT BEDTIME AS NEEDED Patient taking differently: Take 30 mg by mouth at bedtime as needed for sleep.  07/21/18  Yes Laurey Morale, MD  doxycycline (VIBRA-TABS) 100 MG tablet Take 1 tablet (100 mg total) by mouth 2 (two) times daily. Patient not taking: Reported on 01/10/2019 11/10/18   Tanda Rockers, MD  ondansetron (ZOFRAN) 4 MG tablet Take 1 tablet (4 mg total) by mouth every 6 (six) hours. Patient not taking: Reported on 2/83/6629 4/76/54   Delora Fuel, MD  phentermine 37.5 MG capsule TAKE 1 CAPSULE BY MOUTH ONCE DAILY IN THE MORNING Patient not taking: Reported on 01/10/2019 11/12/18   Laurey Morale, MD  umeclidinium-vilanterol (ANORO ELLIPTA) 62.5-25 MCG/INH AEPB Inhale 1 puff into the lungs daily. 11/10/18   Tanda Rockers, MD    Physical Exam: Vitals:   01/11/19 0000 01/11/19 0015 01/11/19 0030 01/11/19 0100  BP: 138/60 138/70 (!) 151/81   Pulse: 95 90 76   Resp:      Temp:      TempSrc:      SpO2: 92% 90% 92%   Weight:    124.7 kg  Height:    5' (1.524 m)      Constitutional: Moderately built and nourished. Vitals:   01/11/19 0000 01/11/19 0015 01/11/19 0030 01/11/19 0100  BP: 138/60 138/70 (!) 151/81   Pulse: 95 90 76   Resp:      Temp:      TempSrc:      SpO2: 92% 90% 92%   Weight:    124.7 kg  Height:    5' (1.524 m)   Eyes: Anicteric no pallor. ENMT: No discharge from the ears eyes nose or mouth. Neck: No mass or.  No neck rigidity. Respiratory: Bilateral expiratory wheeze and no crepitations. Cardiovascular: S1-S2 heard. Abdomen: Soft nontender bowel sounds present. Musculoskeletal: No edema.  No joint effusion. Skin: No rash. Neurologic: Alert awake oriented to time place and person.  Moves all extremities. Psychiatric: Appears normal.  Normal affect.   Labs on Admission: I have personally reviewed following labs and imaging studies  CBC: Recent Labs  Lab 01/10/19 1425  WBC 8.7   HGB 12.3  HCT 44.0  MCV 86.1  PLT 937   Basic Metabolic Panel: Recent Labs  Lab 01/10/19 1425  NA 138  K 4.7  CL 96*  CO2 34*  GLUCOSE 260*  BUN 12  CREATININE 0.66  CALCIUM 9.1   GFR: Estimated Creatinine Clearance: 88.9 mL/min (by C-G formula based on SCr of 0.66 mg/dL). Liver Function Tests: No results for input(s): AST, ALT, ALKPHOS, BILITOT, PROT, ALBUMIN in the last 168 hours. No results for input(s): LIPASE, AMYLASE in the last 168 hours. No results for input(s): AMMONIA in the last 168 hours. Coagulation Profile: No results for input(s): INR, PROTIME in the last 168 hours. Cardiac Enzymes: Recent Labs  Lab 01/10/19 1721  TROPONINI <0.03   BNP (last 3 results) Recent Labs    08/31/18 1718 10/25/18 1655  PROBNP 10.0 41.0   HbA1C: No results for input(s): HGBA1C in the last 72 hours. CBG: Recent Labs  Lab 01/10/19 1851 01/11/19 0128  GLUCAP 105* 338*   Lipid Profile: No results for input(s): CHOL, HDL, LDLCALC, TRIG, CHOLHDL, LDLDIRECT in the last 72 hours. Thyroid Function Tests: No results for input(s): TSH, T4TOTAL, FREET4, T3FREE, THYROIDAB in the last 72 hours. Anemia Panel: No results for input(s): VITAMINB12, FOLATE, FERRITIN, TIBC, IRON, RETICCTPCT in the last 72 hours. Urine analysis:    Component Value Date/Time   COLORURINE YELLOW 06/29/2017 0529   APPEARANCEUR HAZY (A) 06/29/2017 0529   LABSPEC 1.010 06/29/2017 0529   PHURINE 7.0 06/29/2017 0529   GLUCOSEU NEGATIVE 06/29/2017 0529   GLUCOSEU NEGATIVE 10/18/2015 1126   HGBUR NEGATIVE 06/29/2017 0529   HGBUR negative 09/18/2010 0850   BILIRUBINUR NEGATIVE 06/29/2017 0529   BILIRUBINUR neg 06/05/2017 1157   KETONESUR NEGATIVE 06/29/2017 0529   PROTEINUR NEGATIVE 06/29/2017 0529   UROBILINOGEN 0.2 06/05/2017 1157   UROBILINOGEN 0.2  10/18/2015 1126   NITRITE NEGATIVE 06/29/2017 0529   LEUKOCYTESUR LARGE (A) 06/29/2017 0529   Sepsis  Labs: @LABRCNTIP (procalcitonin:4,lacticidven:4) )No results found for this or any previous visit (from the past 240 hour(s)).   Radiological Exams on Admission: Dg Chest 2 View  Result Date: 01/10/2019 CLINICAL DATA:  Productive cough, fever and sore throat for 3 days. EXAM: CHEST - 2 VIEW COMPARISON:  Chest CT 09/14/2018 FINDINGS: Mildly enlarged cardiac silhouette. Calcific atherosclerotic disease of the aorta. Mediastinal contours appear intact. There is no evidence of focal airspace consolidation, pleural effusion or pneumothorax. Osseous structures are without acute abnormality. Soft tissues are grossly normal. IMPRESSION: No active pulmonary disease. Mildly enlarged cardiac silhouette. Calcific atherosclerotic disease of the aorta. Electronically Signed   By: Fidela Salisbury M.D.   On: 01/10/2019 15:00    EKG: Independently reviewed.  Normal sinus rhythm with nonspecific ST-T changes.  Assessment/Plan Principal Problem:   Acute respiratory failure with hypoxia (HCC) Active Problems:   OSA (obstructive sleep apnea)   Type 2 diabetes mellitus without complications (HCC)   COPD GOLD II if use FEV1/VC   Postinflammatory pulmonary fibrosis ([place])    1. Acute respiratory failure with hypoxia likely from COPD exacerbation for which patient is on steroids nebulizer and Pulmicort.  Since patient has some left-sided pleuritic type chest pain check d-dimer and if positive may get scan.  Check troponin. 2. Diabetes mellitus type 2 with hyperglycemia.  Has not taken her evening dose of Novolin.  Will dose that and note that patient is on IV steroids so closely follow CBC.  I have placed patient on resistant sliding scale coverage. 3. Hypothyroidism on Synthroid. 4. History of diastolic CHF has not taken her Lasix for last few weeks.  If hypoxia does not get better may restart Lasix.  Last 2D echo in May 2019 was showing EF of 55 to 60% with grade 1 diastolic dysfunction. 5. Sleep apnea on  CPAP. 6. Postinflammatory pulmonary fibrosis.  Followed by pulmonologist. 7. Patient takes naproxen for chronic pain.   DVT prophylaxis: Lovenox. Code Status: Full code. Family Communication: Discussed with patient. Disposition Plan: Home. Consults called: None. Admission status: Observation.   Rise Patience MD [facility] Pager [phone].  If 7PM-7AM, please contact night-coverage [email or web address] Password [facility]  01/11/2019, 1:37 AM

## 2019-01-11 NOTE — Progress Notes (Signed)
Echocardiogram 2D Echocardiogram has been performed.  01/11/2019 12:22 PM Maudry Mayhew, MHA, RVT, RDCS, RDMS

## 2019-01-11 NOTE — Plan of Care (Signed)
Patient encouraged to call for assistance out of bed due to oxygen tubing, patient steady on feet. No bed alarm indicated at this time. Will continue to monitor patient.

## 2019-01-11 NOTE — Consult Note (Signed)
   [facility] CM Inpatient Consult   01/11/2019  Bonnie Gonzalez [birth year] [id number]   Referral received for [name] for uncontrolled Diabetes Mellitus Type 2 with a Hgb A1C of 10.0 noted for post hospital follow up.  Patient with a HX of COPD exacerbations as well.   Chart review reveals patient's primary care provider is Dr. [name]. Patient is with [name].  Met with patient and daughter at the bedside.  Patient states she has the new [place]. She is having to change to in network physicians.  She was in patient care when this writer arrive.  I was able to speak with her and she wanted to review the packet of information regarding [facility].  Will follow up for progress and needs.  For questions, please contact:  [name], RN BSN [name]  [phone] business mobile phone Toll free office [phone]

## 2019-01-11 NOTE — Progress Notes (Signed)
Patient admitted after midnight.   Hx of copd not on home oxygen, sleep apnea (not officially diagnosed), obesity diabetes diastolic dysfunction and anxiety admitted acute respiratory failure related to copd exacerbation in setting of postinflammatory pulmonary fibrosis. Used to see Melvyn Novas but switched to [facility]. In addition has not taken lasix for a few weeks. Of note she is very anxious and cries easily. Daughter is Sports coach at [employer]. Providing scheduled nebs, solumedrol, anti-tussive, getting echo. Requested CM assist with finding new MDs that are in her "new network as insurance has changed".   PE General: obese awake anxious mild distress CV HS distant, RRR no mgr trace LE edema Resp: BS quite diminished in bilateral bases, diffuse faint end expiratory wheeze, improved air movement mid lobes Abd: obese soft +BS no guarding or rebounding MS: joints without swelling/erythema   Radene Gunning, NP

## 2019-01-12 DIAGNOSIS — J449 Chronic obstructive pulmonary disease, unspecified: Secondary | ICD-10-CM

## 2019-01-12 LAB — CBC
HEMATOCRIT: 40.3 % (ref 36.0–46.0)
Hemoglobin: 12 g/dL (ref 12.0–15.0)
MCH: 24.8 pg — ABNORMAL LOW (ref 26.0–34.0)
MCHC: 29.8 g/dL — ABNORMAL LOW (ref 30.0–36.0)
MCV: 83.3 fL (ref 80.0–100.0)
Platelets: 180 10*3/uL (ref 150–400)
RBC: 4.84 MIL/uL (ref 3.87–5.11)
RDW: 16.8 % — ABNORMAL HIGH (ref 11.5–15.5)
WBC: 16.9 10*3/uL — ABNORMAL HIGH (ref 4.0–10.5)
nRBC: 0 % (ref 0.0–0.2)

## 2019-01-12 LAB — GLUCOSE, CAPILLARY
Glucose-Capillary: 298 mg/dL — ABNORMAL HIGH (ref 70–99)
Glucose-Capillary: 411 mg/dL — ABNORMAL HIGH (ref 70–99)
Glucose-Capillary: 372 mg/dL — ABNORMAL HIGH (ref 70–99)
Glucose-Capillary: 256 mg/dL — ABNORMAL HIGH (ref 70–99)
Glucose-Capillary: 236 mg/dL — ABNORMAL HIGH (ref 70–99)

## 2019-01-12 LAB — BASIC METABOLIC PANEL
Anion gap: 10 (ref 5–15)
BUN: 18 mg/dL (ref 8–23)
CHLORIDE: 92 mmol/L — AB (ref 98–111)
CO2: 31 mmol/L (ref 22–32)
Calcium: 9.3 mg/dL (ref 8.9–10.3)
Creatinine, Ser: 0.77 mg/dL (ref 0.44–1.00)
GFR calc Af Amer: >60 mL/min (ref >60)
GFR calc non Af Amer: >60 mL/min (ref >60)
Glucose, Bld: 353 mg/dL — ABNORMAL HIGH (ref 70–99)
POTASSIUM: 5.1 mmol/L (ref 3.5–5.1)
Sodium: 133 mmol/L — ABNORMAL LOW (ref 135–145)

## 2019-01-12 LAB — TROPONIN I: Troponin I: <0.03 ng/mL (ref <0.03)

## 2019-01-12 MED ORDER — FUROSEMIDE 40 MG PO TABS
40.0000 mg | ORAL_TABLET | Freq: Every day | ORAL | Status: DC
  Administered 2019-01-12 – 2019-01-16 (×5): 40 mg via ORAL
  Filled 2019-01-12 (×6): qty 1

## 2019-01-12 MED ORDER — INSULIN NPH (HUMAN) (ISOPHANE) 100 UNIT/ML ~~LOC~~ SUSP
125.0000 [IU] | Freq: Two times a day (BID) | SUBCUTANEOUS | Status: DC
  Administered 2019-01-12 – 2019-01-13 (×2): 125 [IU] via SUBCUTANEOUS
  Filled 2019-01-12: qty 10

## 2019-01-12 MED ORDER — INSULIN ASPART 100 UNIT/ML ~~LOC~~ SOLN
14.0000 [IU] | Freq: Three times a day (TID) | SUBCUTANEOUS | Status: DC
  Administered 2019-01-12 – 2019-01-15 (×4): 14 [IU] via SUBCUTANEOUS

## 2019-01-12 MED ORDER — METHYLPREDNISOLONE SODIUM SUCC 40 MG IJ SOLR
40.0000 mg | Freq: Four times a day (QID) | INTRAMUSCULAR | Status: DC
  Administered 2019-01-12 – 2019-01-13 (×3): 40 mg via INTRAVENOUS
  Filled 2019-01-12 (×3): qty 1

## 2019-01-12 MED ORDER — HYDROXYZINE HCL 25 MG PO TABS
25.0000 mg | ORAL_TABLET | Freq: Three times a day (TID) | ORAL | Status: DC | PRN
  Administered 2019-01-12 – 2019-01-15 (×4): 25 mg via ORAL
  Filled 2019-01-12 (×4): qty 1

## 2019-01-12 MED ORDER — NAPROXEN 250 MG PO TABS
500.0000 mg | ORAL_TABLET | Freq: Two times a day (BID) | ORAL | Status: DC
  Administered 2019-01-12 – 2019-01-16 (×7): 500 mg via ORAL
  Filled 2019-01-12 (×7): qty 2

## 2019-01-12 MED ORDER — ALBUTEROL SULFATE (2.5 MG/3ML) 0.083% IN NEBU
2.5000 mg | INHALATION_SOLUTION | Freq: Three times a day (TID) | RESPIRATORY_TRACT | Status: DC
  Administered 2019-01-12 – 2019-01-16 (×13): 2.5 mg via RESPIRATORY_TRACT
  Filled 2019-01-12 (×13): qty 3

## 2019-01-12 MED ORDER — INSULIN ASPART 100 UNIT/ML ~~LOC~~ SOLN
20.0000 [IU] | Freq: Once | SUBCUTANEOUS | Status: AC
  Administered 2019-01-12: 20 [IU] via SUBCUTANEOUS

## 2019-01-12 NOTE — Progress Notes (Signed)
PROGRESS NOTE  Bonnie Gonzalez  AJO:[id number] DOB: October 27, 1956 DOA: 01/10/2019 PCP: Laurey Morale, MD   Brief Narrative: Bonnie Gonzalez is a 63 y.o. female with a history of COPD, likely OSA, morbid obesity (BMI 53), poorly-controlled IDT2DM, and chronic HFpEF who presented to the ED with shortness of breath, wheezing, and cough with pleuritic chest pain. Work up in the ED included negative flu panel, troponin, CXR, ECG, and normal BNP. She was hypoxic and admitted for presumed COPD exacerbation.     Assessment & Plan: Principal Problem:   Acute respiratory failure with hypoxia (HCC) Active Problems:   Morbid obesity due to excess calories (HCC)   OSA (obstructive sleep apnea)   Type 2 diabetes mellitus without complications (HCC)   COPD GOLD II if use FEV1/VC   Postinflammatory pulmonary fibrosis (HCC)  Acute hypoxic respiratory failure: Due to COPD exacerbation on baseline with postinflammatory pulmonary fibrosis, morbid obesity, suspect OHS, deconditioning, very strong suspicion for OSA, though undiagnosed, and chronic HFpEF. D-dimer borderline so CTA chest performed, shows no significant abnormality, including no PE.  - Continue supplemental oxygen as needed to maintain SpO2 >88%. Due to significant comorbidities, anticipate protracted recovery period. May need DC home with new oxygen, ambulate prior to DC.   COPD exacerbation, postinflammatory pulmonary fibrosis:  - Continue IV steroids, scheduled and prn breathing treatments - Continue pulmicort - No cultures. No infiltrate on CT chest, will not start abx. - Continue tessalon TID prn  IDT2DM poorly-controlled with hyperglycemia and steroid-induced worsening of hyperglycemia: HbA1c 10% - Augmenting insulin regimen. Increase NPH 100u BID > 125u BID and increase mealtime novolog 10u > 14u TIDWC, continue HS scale and resistant SSI. Note she required 30u for CBG >400mg /dl earlier today and given an additional 20u for refractory  hyperglycemia. With this level of insulin given, strongly doubt she would develop DKA. - Appreciate diabetes coordinator's assistance with dosing and patient education  Morbid obesity:  - A significant amount of time spent counseling regarding need for weight loss and reinforcing the fact that there are no reliable and safe medical therapies currently for obesity. Could consider bariatric surgery referral, pt currently resistant.  - Dietitian consulted  Suspected OSA:  - CPAP qHS empirically.  - Sleep study as outpatient  Chronic HFpEF: EF 2/11 with stable presreved EF and G1DD.  - Has some minimal LE edema, though could be venous insufficiency. IVC dilated with decreased respirophasic changes. With ongoing hypoxia, will start lasix po home dose and monitor I/O, weights, and BMP.   Chronic pain, DJD:  - Emphasized the role of weight loss for treatment - Continue home naproxen  Medication overuse headache: Currently experiencing per pt.  - Advised to limit NSAIDs, though pt requests scheduled naproxen which will be ordered.  Hypothyroidism:  - Continue synthroid  Anxiety: Contributing to symptoms.  - Restart home vistaril, continue xanax  Mild cognitive impairment: Pt oriented.  - Continue aricept.  GERD:  - Continue PPI  DVT prophylaxis: Lovenox Code Status: Full Family Communication: Daughter at bedside Disposition Plan: Home once improved.   Consultants:   Diabetes coordinator  Procedures:   Echocardiogram:  1. The left ventricle has normal systolic function of 94-70%. The cavity size was normal. There is no increase in left ventricular wall thickness. Left ventricular diastolic Doppler parameters are consistent with impaired relaxation (grade I).  Indeterminate filling pressures  2. The right ventricle has normal systolic function. The cavity was normal. There is no increase in right ventricular wall thickness.  Right ventricular systolic pressure normal with an  estimated pressure of 29.6 mmHg.  3. The mitral valve is normal in structure.  4. The tricuspid valve is normal in structure.  5. The aortic valve is normal in structure.  6. The pulmonic valve was normal in structure.  7. The inferior vena cava was dilated in size with <50% respiratory variability.  Antimicrobials:  None   Subjective: Dyspnea present at rest, worse with any exertion, not near baseline. Feels very anxious about dyspnea. Denies chest pain. Has some leg swelling all the time, currently a little less than usual. Had a better night with trial of CPAP, slept more than she ever does. Falls asleep at work sometimes, her daughter with whom she lives states she is aware of apneic periods while sleeping.   Objective: Vitals:   01/11/19 2324 01/11/19 2337 01/12/19 0807 01/12/19 0852  BP:  136/62 127/74   Pulse: 66 (!) 59 65   Resp: 18 19 20    Temp:  98 F (36.7 C) 98.3 F (36.8 C)   TempSrc:  Oral    SpO2: 91% 94% 100% 96%  Weight:      Height:        Intake/Output Summary (Last 24 hours) at 01/12/2019 1407 Last data filed at 01/12/2019 0700 Gross per 24 hour  Intake 240 ml  Output -  Net 240 ml   Filed Weights   01/11/19 0100  Weight: 124.7 kg    Gen: 63 y.o. female in no distress  Pulm: Non-labored tachypnea on 4L O2, diminished with end-expiratory wheezing. No crackles.  CV: Regular rate and rhythm. No murmur, rub, or gallop. No JVD, 1+ pitting pedal edema. GI: Abdomen soft, non-tender, non-distended, with normoactive bowel sounds. No organomegaly or masses felt. Ext: Warm, no deformities Skin: No rashes, lesions or ulcers Neuro: Alert and oriented. No focal neurological deficits. Psych: Judgement and insight appear intact, short term recall impaired. Mood anxious, affect congruent.   Data Reviewed: I have personally reviewed following labs and imaging studies  CBC: Recent Labs  Lab 01/10/19 1425 01/11/19 0221 01/12/19 0128  WBC 8.7 10.7* 16.9*  HGB  12.3 12.5 12.0  HCT 44.0 43.6 40.3  MCV 86.1 84.2 83.3  PLT 157 173 829   Basic Metabolic Panel: Recent Labs  Lab 01/10/19 1425 01/11/19 0221 01/12/19 0304  NA 138 135 133*  K 4.7 4.9 5.1  CL 96* 93* 92*  CO2 34* 28 31  GLUCOSE 260* 410* 353*  BUN 12 13 18   CREATININE 0.66 0.79 0.77  CALCIUM 9.1 9.4 9.3   GFR: Estimated Creatinine Clearance: 88.9 mL/min (by C-G formula based on SCr of 0.77 mg/dL). Liver Function Tests: No results for input(s): AST, ALT, ALKPHOS, BILITOT, PROT, ALBUMIN in the last 168 hours. No results for input(s): LIPASE, AMYLASE in the last 168 hours. No results for input(s): AMMONIA in the last 168 hours. Coagulation Profile: No results for input(s): INR, PROTIME in the last 168 hours. Cardiac Enzymes: Recent Labs  Lab 01/10/19 1721 01/11/19 1415 01/11/19 1945 01/12/19 0128  TROPONINI <0.03 <0.03 <0.03 <0.03   BNP (last 3 results) Recent Labs    08/31/18 1718 10/25/18 1655  PROBNP 10.0 41.0   HbA1C: Recent Labs    01/11/19 0210  HGBA1C 10.0*   CBG: Recent Labs  Lab 01/11/19 1711 01/11/19 2112 01/12/19 0728 01/12/19 1143 01/12/19 1349  GLUCAP 354* 410* 298* 411* 372*   Lipid Profile: No results for input(s): CHOL, HDL, LDLCALC, TRIG, CHOLHDL, LDLDIRECT  in the last 72 hours. Thyroid Function Tests: No results for input(s): TSH, T4TOTAL, FREET4, T3FREE, THYROIDAB in the last 72 hours. Anemia Panel: No results for input(s): VITAMINB12, FOLATE, FERRITIN, TIBC, IRON, RETICCTPCT in the last 72 hours. Urine analysis:    Component Value Date/Time   COLORURINE YELLOW 06/29/2017 0529   APPEARANCEUR HAZY (A) 06/29/2017 0529   LABSPEC 1.010 06/29/2017 0529   PHURINE 7.0 06/29/2017 0529   GLUCOSEU NEGATIVE 06/29/2017 0529   GLUCOSEU NEGATIVE 10/18/2015 1126   HGBUR NEGATIVE 06/29/2017 0529   HGBUR negative 09/18/2010 0850   BILIRUBINUR NEGATIVE 06/29/2017 0529   BILIRUBINUR neg 06/05/2017 1157   KETONESUR NEGATIVE 06/29/2017 0529    PROTEINUR NEGATIVE 06/29/2017 0529   UROBILINOGEN 0.2 06/05/2017 1157   UROBILINOGEN 0.2 10/18/2015 1126   NITRITE NEGATIVE 06/29/2017 0529   LEUKOCYTESUR LARGE (A) 06/29/2017 0529   No results found for this or any previous visit (from the past 240 hour(s)).    Radiology Studies: Dg Chest 2 View  Result Date: 01/10/2019 CLINICAL DATA:  Productive cough, fever and sore throat for 3 days. EXAM: CHEST - 2 VIEW COMPARISON:  Chest CT 09/14/2018 FINDINGS: Mildly enlarged cardiac silhouette. Calcific atherosclerotic disease of the aorta. Mediastinal contours appear intact. There is no evidence of focal airspace consolidation, pleural effusion or pneumothorax. Osseous structures are without acute abnormality. Soft tissues are grossly normal. IMPRESSION: No active pulmonary disease. Mildly enlarged cardiac silhouette. Calcific atherosclerotic disease of the aorta. Electronically Signed   By: Fidela Salisbury M.D.   On: 01/10/2019 15:00   Ct Angio Chest Pe W Or Wo Contrast  Result Date: 01/11/2019 CLINICAL DATA:  Dyspnea. EXAM: CT ANGIOGRAPHY CHEST WITH CONTRAST TECHNIQUE: Multidetector CT imaging of the chest was performed using the standard protocol during bolus administration of intravenous contrast. Multiplanar CT image reconstructions and MIPs were obtained to evaluate the vascular anatomy. CONTRAST:  50mL ISOVUE-370 IOPAMIDOL (ISOVUE-370) INJECTION 76% COMPARISON:  Chest CT 09/14/2018 FINDINGS: Cardiovascular: The heart size is normal. No substantial pericardial effusion. Atherosclerotic calcification is noted in the wall of the thoracic aorta. No filling defect in the opacified pulmonary arteries to suggest the presence of an acute pulmonary embolus. Mediastinum/Nodes: No mediastinal lymphadenopathy. There is no hilar lymphadenopathy. The esophagus has normal imaging features. There is no axillary lymphadenopathy. Lungs/Pleura: The central tracheobronchial airways are patent. There is some minimal  subpleural reticulation in the lungs bilaterally with a lower lung predominance. No focal airspace consolidation. No pleural effusion. Upper Abdomen: Irregular liver contour is highly suggestive of cirrhosis. Musculoskeletal: No worrisome lytic or sclerotic osseous abnormality. Stable mild compression deformity at T12. Review of the MIP images confirms the above findings. IMPRESSION: 1. No CT evidence for acute pulmonary embolus. 2. No pulmonary edema, pleural effusion, or focal lung consolidation. 3. Cirrhosis 4.  Aortic Atherosclerois (ICD10-170.0) Electronically Signed   By: Misty Stanley M.D.   On: 01/11/2019 18:35    Scheduled Meds: . albuterol  2.5 mg Nebulization TID  . ALPRAZolam  0.5-1 mg Oral BID  . atorvastatin  40 mg Oral QHS  . donepezil  10 mg Oral QHS  . DULoxetine  60 mg Oral QHS  . enoxaparin (LOVENOX) injection  60 mg Subcutaneous Q24H  . insulin aspart  0-20 Units Subcutaneous TID WC  . insulin aspart  0-5 Units Subcutaneous QHS  . insulin aspart  14 Units Subcutaneous TID WC  . insulin aspart  20 Units Subcutaneous Once  . insulin NPH Human  125 Units Subcutaneous BID AC & HS  .  levothyroxine  175 mcg Oral Q0600  . mouth rinse  15 mL Mouth Rinse q12n4p  . naproxen  500 mg Oral BID WC  . pantoprazole  40 mg Oral Daily   Continuous Infusions:   LOS: 1 day   Time spent: 35 minutes.  Patrecia Pour, MD [facility] [email or web address] Password [facility] 01/12/2019, 2:07 PM

## 2019-01-12 NOTE — Progress Notes (Signed)
Pt placed on CPAP for the night with 4lpm oxygen bled in. Pt tolerating well with full face mask.

## 2019-01-12 NOTE — Progress Notes (Signed)
CRITICAL VALUE ALERT  Critical Value:  Glucose 411  Date & Time Notied:  01/12/2019 at 1143  Provider Notified: Dr. Bonner Puna  Orders Received/Actions taken: Recheck in 1 hour after Novo log administration and call with results.

## 2019-01-12 NOTE — Progress Notes (Addendum)
Inpatient Diabetes Program Recommendations  AACE/ADA: New Consensus Statement on Inpatient Glycemic Control (2015)  Target Ranges:  Prepandial:   less than 140 mg/dL      Peak postprandial:   less than 180 mg/dL (1-2 hours)      Critically ill patients:  140 - 180 mg/dL   Lab Results  Component Value Date   GLUCAP 411 (H) 01/12/2019   HGBA1C 10.0 (H) 01/11/2019    Review of Glycemic Control Results for PEACHIE, BARKALOW (MRN [id number]) as of 01/12/2019 13:44  Ref. Range 01/11/2019 17:11 01/11/2019 21:12 01/12/2019 07:28 01/12/2019 11:43  Glucose-Capillary Latest Ref Range: 70 - 99 mg/dL 354 (H) 410 (H) 298 (H) 411 (H)   Diabetes history: Type 2 DM Outpatient Diabetes medications: NPH- 350 units QAM, 30 units QPM Current orders for Inpatient glycemic control: NPH 100 units BID, Novolog 0-20 units TID, Novolog 10 units TID  Inpatient Diabetes Program Recommendations:    - Increase NPH to 125 units BID. -Increase Novolog 14 units TID.   Spoke with patient again regarding DM. Reviewed goal list from yesterday and discussed possibility of Freestyle Libre. Patient educated again on censors, meter, how to apply, perform readings, cost etc. Patient is interested.  Provided an endo list to patient that is applicable for her new insurance. Stressed the importance of making an appointment today with endo and new PCP. Reviewed when to MD, to reach out to PCP following discharge with insulin adjustments, educated on when to check CBGs, and what to discuss with endo at initial visit. Daughter is planning on attending both visits. Patient is tearful, however hopeful for improvement to glycemic control. Will continue to follow. 1400: Orders obtained. Thanks, Bronson Curb, MSN, RNC-OB Diabetes Coordinator [phone] (8a-5p)

## 2019-01-12 NOTE — Plan of Care (Signed)

## 2019-01-13 LAB — GLUCOSE, CAPILLARY
Glucose-Capillary: 239 mg/dL — ABNORMAL HIGH (ref 70–99)
Glucose-Capillary: 197 mg/dL — ABNORMAL HIGH (ref 70–99)
Glucose-Capillary: 70 mg/dL (ref 70–99)
Glucose-Capillary: 121 mg/dL — ABNORMAL HIGH (ref 70–99)

## 2019-01-13 MED ORDER — METHYLPREDNISOLONE SODIUM SUCC 40 MG IJ SOLR
40.0000 mg | Freq: Two times a day (BID) | INTRAMUSCULAR | Status: DC
  Administered 2019-01-13 – 2019-01-14 (×2): 40 mg via INTRAVENOUS
  Filled 2019-01-13 (×2): qty 1

## 2019-01-13 MED ORDER — INSULIN NPH (HUMAN) (ISOPHANE) 100 UNIT/ML ~~LOC~~ SUSP
62.0000 [IU] | Freq: Once | SUBCUTANEOUS | Status: AC
  Administered 2019-01-13: 62 [IU] via SUBCUTANEOUS
  Filled 2019-01-13: qty 10

## 2019-01-13 MED ORDER — FUROSEMIDE 40 MG PO TABS
40.0000 mg | ORAL_TABLET | Freq: Once | ORAL | Status: AC
  Administered 2019-01-13: 40 mg via ORAL
  Filled 2019-01-13: qty 1

## 2019-01-13 NOTE — Evaluation (Signed)
Physical Therapy Evaluation Patient Details Name: Bonnie Gonzalez MRN: [id number] DOB: [birth year] Today's Date: 01/13/2019   History of Present Illness  Bonnie Gonzalez is a 63 y.o. female with a history of COPD, likely OSA, morbid obesity (BMI 53), poorly-controlled IDT2DM, and chronic HFpEF who presented to the ED with shortness of breath, wheezing, and cough with pleuritic chest pain. Work up in the ED included negative flu panel, troponin, CXR, ECG, and normal BNP. She was hypoxic and admitted for presumed COPD exacerbation.      Clinical Impression  Pt admitted with above diagnosis. Pt currently with functional limitations due to the deficits listed below (see PT Problem List). PTA, patient living in mobile home alone driving, mod I with SPC for gait, no home O2 but reports worsening SOB and tolerance to activity. Today, pt with weakness and decreased activity tolerance. Walking with close supervision and RW on 4L sats to 89% in 20'. Hope to progress patient to HHPT, followed by OP PT for further help with her back and hip.  Pt will benefit from skilled PT to increase their independence and safety with mobility to allow discharge to the venue listed below.       Follow Up Recommendations Home health PT;Supervision/Assistance - 24 hour    Equipment Recommendations  None recommended by PT    Recommendations for Other Services OT consult     Precautions / Restrictions Precautions Precautions: Fall Restrictions Weight Bearing Restrictions: No      Mobility  Bed Mobility Overal bed mobility: Modified Independent             General bed mobility comments: increaed time and effort  Transfers Overall transfer level: Needs assistance Equipment used: Rolling walker (2 wheeled) Transfers: Sit to/from Stand Sit to Stand: Min assist            Ambulation/Gait Ambulation/Gait assistance: Supervision Gait Distance (Feet): 20 Feet Assistive device: Rolling walker (2  wheeled) Gait Pattern/deviations: Step-to pattern;Step-through pattern Gait velocity: decreased   General Gait Details: utilzing RW as pt is weaker than baseline, de sat to 89% on 4L, no DOE in limited walking. fatigues quickly.   Stairs            Wheelchair Mobility    Modified Rankin (Stroke Patients Only)       Balance                                             Pertinent Vitals/Pain Pain Assessment: Faces Faces Pain Scale: Hurts little more Pain Location: chronic back pain and hip pain Pain Descriptors / Indicators: Aching Pain Intervention(s): Limited activity within patient's tolerance    Home Living Family/patient expects to be discharged to:: Private residence Living Arrangements: Alone Available Help at Discharge: Family;Available 24 hours/day(daugther staying with her for short term avail 24/7) Type of Home: Mobile home Home Access: Stairs to enter Entrance Stairs-Rails: None Entrance Stairs-Number of Steps: 2 Home Layout: One level Home Equipment: Walker - 2 wheels;Cane - single point;Shower seat      Prior Function Level of Independence: Independent with assistive device(s)         Comments: mod I with SPC     Hand Dominance   Dominant Hand: Right    Extremity/Trunk Assessment   Upper Extremity Assessment Upper Extremity Assessment: Overall WFL for tasks assessed    Lower Extremity Assessment Lower  Extremity Assessment: Overall WFL for tasks assessed       Communication   Communication: No difficulties  Cognition Arousal/Alertness: Awake/alert Behavior During Therapy: WFL for tasks assessed/performed Overall Cognitive Status: Within Functional Limits for tasks assessed                                        General Comments      Exercises     Assessment/Plan    PT Assessment Patient needs continued PT services  PT Problem List Decreased strength       PT Treatment Interventions DME  instruction;Gait training;Stair training;Functional mobility training;Therapeutic activities;Therapeutic exercise;Balance training    PT Goals (Current goals can be found in the Care Plan section)  Acute Rehab PT Goals Patient Stated Goal: go home with daugthers help PT Goal Formulation: With patient Time For Goal Achievement: 01/27/19 Potential to Achieve Goals: Good    Frequency Min 3X/week   Barriers to discharge        Co-evaluation               AM-PAC PT "6 Clicks" Mobility  Outcome Measure Help needed turning from your back to your side while in a flat bed without using bedrails?: None Help needed moving from lying on your back to sitting on the side of a flat bed without using bedrails?: None Help needed moving to and from a bed to a chair (including a wheelchair)?: A Little Help needed standing up from a chair using your arms (e.g., wheelchair or bedside chair)?: A Little Help needed to walk in hospital room?: A Little Help needed climbing 3-5 steps with a railing? : A Lot 6 Click Score: 19    End of Session Equipment Utilized During Treatment: Gait belt;Oxygen Activity Tolerance: Patient limited by fatigue Patient left: in bed;with call bell/phone within reach Nurse Communication: Mobility status PT Visit Diagnosis: Unsteadiness on feet (R26.81)    Time: 1520-1550 PT Time Calculation (min) (ACUTE ONLY): 30 min   Charges:   PT Evaluation $PT Eval Low Complexity: 1 Low PT Treatments $Gait Training: 8-22 mins       Reinaldo Berber, PT, DPT Acute Rehabilitation Services Pager: [phone] Office: [place] 01/13/2019, 3:45 PM

## 2019-01-13 NOTE — Consult Note (Signed)
   [facility] CM Inpatient Consult   01/13/2019  Bonnie Gonzalez 06-30-1956 [id number]  Patient evaluated for community based chronic disease management services with [facility] as a benefit of patient's Medicare Insurance. Spoke with patient at bedside to explain [facility] services. Consent form signed.  Patient will receive post hospital discharge call and will be evaluated for complex disease process education and management.  Left contact information and [facility] literature at bedside. Made Inpatient Case Manager aware that [facility] following. Of note, [facility] Care Management services does not replace or interfere with any services that are arranged by inpatient case management or social work.  For additional questions or referrals please contact:    [name], RN BSN [facility] Liaison  [phone] business mobile phone Toll free office [phone]

## 2019-01-13 NOTE — Progress Notes (Signed)
Pt placed on CPAP for the night with 4lpm bled into system.  Pt tolerating well with full face mask.  Sats 92% with oxygen in place.

## 2019-01-13 NOTE — Progress Notes (Addendum)
Nutrition Consult/Education Note  RD consulted for nutrition education regarding diabetes.   Lab Results  Component Value Date   HGBA1C 10.0 (H) 01/11/2019    RD provided "Carbohydrate Counting for People with Diabetes" handout from the Academy of Nutrition and Dietetics. Discussed different food groups and their effects on blood sugar, emphasizing carbohydrate-containing foods. Provided list of carbohydrates and recommended serving sizes of common foods.  Discussed importance of controlled and consistent carbohydrate intake throughout the day. Provided examples of ways to balance meals/snacks and encouraged intake of high-fiber, whole grain complex carbohydrates.   Pt has poor eating behaviors such as snacking when not hungry and not stopping when she's comfortably full with a meal. She also emotionally eats, turning to food for comfort. She states her biggest challenge is portion control. RD helped pt set some goals to help facilitate weight loss.  Teach back method used. Expect fair compliance.  Body mass index is 54.47 kg/m. Pt meets criteria for Obesity Class III based on current BMI.  If additional nutrition issues arise, please re-consult RD.  Arthur Holms, RD, LDN Pager #: [phone] After-Hours Pager #: [phone]

## 2019-01-13 NOTE — Progress Notes (Signed)
PROGRESS NOTE  Bonnie Gonzalez  JJH:[id number] DOB: 12-May-1956 DOA: 01/10/2019 PCP: Laurey Morale, MD   Brief Narrative: Bonnie Gonzalez is a 63 y.o. female with a history of COPD, likely OSA, morbid obesity (BMI 53), poorly-controlled IDT2DM, and chronic HFpEF who presented to the ED with shortness of breath, wheezing, and cough with pleuritic chest pain. Work up in the ED included negative flu panel, troponin, CXR, ECG, and normal BNP. She was hypoxic and admitted for presumed COPD exacerbation.  Assessment & Plan: Principal Problem:   Acute respiratory failure with hypoxia (HCC) Active Problems:   Morbid obesity due to excess calories (HCC)   OSA (obstructive sleep apnea)   Type 2 diabetes mellitus without complications (HCC)   COPD GOLD II if use FEV1/VC   Postinflammatory pulmonary fibrosis (HCC)  Acute hypoxic respiratory failure: Due to COPD exacerbation on baseline with postinflammatory pulmonary fibrosis, morbid obesity, suspect OHS, deconditioning, very strong suspicion for OSA, though undiagnosed, and chronic HFpEF. D-dimer borderline so CTA chest performed, shows no significant abnormality, including no PE.  - Continue supplemental oxygen as needed to maintain SpO2 >88%. Due to significant comorbidities, anticipate protracted recovery period. May need DC home with new oxygen.  - Check pulse oximetry while ambulating prior to DC.   COPD exacerbation, postinflammatory pulmonary fibrosis:  - Continue IV steroids, will decrease to q12h with improvement in wheezing - Continue scheduled and prn breathing treatments - Continue pulmicort - No cultures. No infiltrate on CT chest, will not start abx. - Continue tessalon TID prn  IDT2DM poorly-controlled with hyperglycemia and steroid-induced worsening of hyperglycemia: HbA1c 10% - Improving control with augmented regimen. Continue NPH 100u BID 125u BID and mealtime novolog 14u TIDWC, continue HS scale and resistant SSI. Will titrate based  on levels today. - Appreciate diabetes coordinator's assistance with dosing and patient education. - Pt calling to schedule with endocrinologist today.  Morbid obesity:  - A significant amount of time spent counseling regarding need for weight loss and reinforcing the fact that there are no reliable and safe medical therapies currently for obesity. Could consider bariatric surgery referral, pt currently resistant.  - Dietitian consulted  Suspected OSA:  - CPAP qHS empirically. This is significantly improving quality of life, would try to get this for her at discharge.  - Sleep study as outpatient  Chronic HFpEF: EF 2/11 with stable presreved EF and G1DD.  - Has some minimal LE edema, though could be venous insufficiency. IVC dilated with decreased respirophasic changes.  - Restarted lasix po home dose, will give additional dose this PM.  - Monitor I/O, weights, and BMP.   Chronic pain, DJD:  - Emphasized the role of weight loss for treatment - Continue home naproxen  Medication overuse headache: Currently experiencing per pt.  - Advised to limit NSAIDs, though pt requests scheduled naproxen which will be ordered.  Hypothyroidism:  - Continue synthroid  Anxiety: Contributing to symptoms.  - Restart home vistaril, continue xanax  Mild cognitive impairment: Pt oriented, improving, ?if untreated sleep apnea contributing. - Continue aricept.  GERD:  - Continue PPI  DVT prophylaxis: Lovenox Code Status: Full Family Communication: None at bedside Disposition Plan: Home once improved.   Consultants:   Diabetes coordinator  Procedures:   Echocardiogram:  1. The left ventricle has normal systolic function of 81-85%. The cavity size was normal. There is no increase in left ventricular wall thickness. Left ventricular diastolic Doppler parameters are consistent with impaired relaxation (grade I).  Indeterminate filling pressures  2. The right ventricle has normal systolic  function. The cavity was normal. There is no increase in right ventricular wall thickness. Right ventricular systolic pressure normal with an estimated pressure of 29.6 mmHg.  3. The mitral valve is normal in structure.  4. The tricuspid valve is normal in structure.  5. The aortic valve is normal in structure.  6. The pulmonic valve was normal in structure.  7. The inferior vena cava was dilated in size with <50% respiratory variability.  Antimicrobials:  None   Subjective: Slept better, didn't wake up last night as she usually dose, feels better rested. Still has severe shortness of breath, worst with exertion and hacking cough with yellow white sputum. No chest pain.  Objective: Vitals:   01/12/19 2328 01/13/19 0429 01/13/19 0738 01/13/19 0752  BP: (!) 133/59   (!) 152/77  Pulse: 62  65 (!) 59  Resp: 20  18 20   Temp: 98 F (36.7 C)   98 F (36.7 C)  TempSrc: Oral   Oral  SpO2: 96%  96% 96%  Weight:  126.5 kg    Height:        Intake/Output Summary (Last 24 hours) at 01/13/2019 1318 Last data filed at 01/13/2019 1038 Gross per 24 hour  Intake 720 ml  Output 550 ml  Net 170 ml   Filed Weights   01/11/19 0100 01/13/19 0429  Weight: 124.7 kg 126.5 kg   Gen: 63 y.o. female in no distress Pulm: Nonlabored tachypnea with prolonged expiratory phase but no wheezing noted. CV: Regular rate and rhythm. No murmur, rub, or gallop. No JVD, 1+ pitting dependent edema. GI: Abdomen soft, non-tender, non-distended, with normoactive bowel sounds.  Ext: Warm, no deformities Skin: No rashes, lesions or ulcers on visualized skin. Neuro: Alert and oriented. No focal neurological deficits. Psych: Judgement and insight appear fair. Mood euthymic & affect congruent. Behavior is appropriate.    Data Reviewed: I have personally reviewed following labs and imaging studies  CBC: Recent Labs  Lab 01/10/19 1425 01/11/19 0221 01/12/19 0128  WBC 8.7 10.7* 16.9*  HGB 12.3 12.5 12.0  HCT 44.0  43.6 40.3  MCV 86.1 84.2 83.3  PLT 157 173 161   Basic Metabolic Panel: Recent Labs  Lab 01/10/19 1425 01/11/19 0221 01/12/19 0304  NA 138 135 133*  K 4.7 4.9 5.1  CL 96* 93* 92*  CO2 34* 28 31  GLUCOSE 260* 410* 353*  BUN 12 13 18   CREATININE 0.66 0.79 0.77  CALCIUM 9.1 9.4 9.3   GFR: Estimated Creatinine Clearance: 89.7 mL/min (by C-G formula based on SCr of 0.77 mg/dL). Liver Function Tests: No results for input(s): AST, ALT, ALKPHOS, BILITOT, PROT, ALBUMIN in the last 168 hours. No results for input(s): LIPASE, AMYLASE in the last 168 hours. No results for input(s): AMMONIA in the last 168 hours. Coagulation Profile: No results for input(s): INR, PROTIME in the last 168 hours. Cardiac Enzymes: Recent Labs  Lab 01/10/19 1721 01/11/19 1415 01/11/19 1945 01/12/19 0128  TROPONINI <0.03 <0.03 <0.03 <0.03   BNP (last 3 results) Recent Labs    08/31/18 1718 10/25/18 1655  PROBNP 10.0 41.0   HbA1C: Recent Labs    01/11/19 0210  HGBA1C 10.0*   CBG: Recent Labs  Lab 01/12/19 1349 01/12/19 1655 01/12/19 2105 01/13/19 0728 01/13/19 1227  GLUCAP 372* 256* 236* 239* 197*   Lipid Profile: No results for input(s): CHOL, HDL, LDLCALC, TRIG, CHOLHDL, LDLDIRECT in the last 72 hours. Thyroid Function Tests: No  results for input(s): TSH, T4TOTAL, FREET4, T3FREE, THYROIDAB in the last 72 hours. Anemia Panel: No results for input(s): VITAMINB12, FOLATE, FERRITIN, TIBC, IRON, RETICCTPCT in the last 72 hours. Urine analysis:    Component Value Date/Time   COLORURINE YELLOW 06/29/2017 0529   APPEARANCEUR HAZY (A) 06/29/2017 0529   LABSPEC 1.010 06/29/2017 0529   PHURINE 7.0 06/29/2017 0529   GLUCOSEU NEGATIVE 06/29/2017 0529   GLUCOSEU NEGATIVE 10/18/2015 1126   HGBUR NEGATIVE 06/29/2017 0529   HGBUR negative 09/18/2010 0850   BILIRUBINUR NEGATIVE 06/29/2017 0529   BILIRUBINUR neg 06/05/2017 1157   KETONESUR NEGATIVE 06/29/2017 0529   PROTEINUR NEGATIVE  06/29/2017 0529   UROBILINOGEN 0.2 06/05/2017 1157   UROBILINOGEN 0.2 10/18/2015 1126   NITRITE NEGATIVE 06/29/2017 0529   LEUKOCYTESUR LARGE (A) 06/29/2017 0529   No results found for this or any previous visit (from the past 240 hour(s)).    Radiology Studies: Ct Angio Chest Pe W Or Wo Contrast  Result Date: 01/11/2019 CLINICAL DATA:  Dyspnea. EXAM: CT ANGIOGRAPHY CHEST WITH CONTRAST TECHNIQUE: Multidetector CT imaging of the chest was performed using the standard protocol during bolus administration of intravenous contrast. Multiplanar CT image reconstructions and MIPs were obtained to evaluate the vascular anatomy. CONTRAST:  58mL ISOVUE-370 IOPAMIDOL (ISOVUE-370) INJECTION 76% COMPARISON:  Chest CT 09/14/2018 FINDINGS: Cardiovascular: The heart size is normal. No substantial pericardial effusion. Atherosclerotic calcification is noted in the wall of the thoracic aorta. No filling defect in the opacified pulmonary arteries to suggest the presence of an acute pulmonary embolus. Mediastinum/Nodes: No mediastinal lymphadenopathy. There is no hilar lymphadenopathy. The esophagus has normal imaging features. There is no axillary lymphadenopathy. Lungs/Pleura: The central tracheobronchial airways are patent. There is some minimal subpleural reticulation in the lungs bilaterally with a lower lung predominance. No focal airspace consolidation. No pleural effusion. Upper Abdomen: Irregular liver contour is highly suggestive of cirrhosis. Musculoskeletal: No worrisome lytic or sclerotic osseous abnormality. Stable mild compression deformity at T12. Review of the MIP images confirms the above findings. IMPRESSION: 1. No CT evidence for acute pulmonary embolus. 2. No pulmonary edema, pleural effusion, or focal lung consolidation. 3. Cirrhosis 4.  Aortic Atherosclerois (ICD10-170.0) Electronically Signed   By: Misty Stanley M.D.   On: 01/11/2019 18:35    Scheduled Meds: . albuterol  2.5 mg Nebulization TID  .  ALPRAZolam  0.5-1 mg Oral BID  . atorvastatin  40 mg Oral QHS  . donepezil  10 mg Oral QHS  . DULoxetine  60 mg Oral QHS  . enoxaparin (LOVENOX) injection  60 mg Subcutaneous Q24H  . furosemide  40 mg Oral Daily  . furosemide  40 mg Oral Once  . insulin aspart  0-20 Units Subcutaneous TID WC  . insulin aspart  0-5 Units Subcutaneous QHS  . insulin aspart  14 Units Subcutaneous TID WC  . insulin NPH Human  125 Units Subcutaneous BID AC & HS  . levothyroxine  175 mcg Oral Q0600  . mouth rinse  15 mL Mouth Rinse q12n4p  . methylPREDNISolone (SOLU-MEDROL) injection  40 mg Intravenous Q12H  . naproxen  500 mg Oral BID WC  . pantoprazole  40 mg Oral Daily   Continuous Infusions:   LOS: 2 days   Time spent: 35 minutes.  Patrecia Pour, MD [facility] [email or web address] Password [email or web address] 01/13/2019, 1:18 PM

## 2019-01-14 DIAGNOSIS — F332 Major depressive disorder, recurrent severe without psychotic features: Secondary | ICD-10-CM

## 2019-01-14 DIAGNOSIS — J9601 Acute respiratory failure with hypoxia: Secondary | ICD-10-CM

## 2019-01-14 LAB — CBC
HCT: 42.6 % (ref 36.0–46.0)
HEMOGLOBIN: 12.2 g/dL (ref 12.0–15.0)
MCH: 24.1 pg — ABNORMAL LOW (ref 26.0–34.0)
MCHC: 28.6 g/dL — ABNORMAL LOW (ref 30.0–36.0)
MCV: 84 fL (ref 80.0–100.0)
Platelets: 165 10*3/uL (ref 150–400)
RBC: 5.07 MIL/uL (ref 3.87–5.11)
RDW: 16.6 % — ABNORMAL HIGH (ref 11.5–15.5)
WBC: 15 10*3/uL — ABNORMAL HIGH (ref 4.0–10.5)
nRBC: 0 % (ref 0.0–0.2)

## 2019-01-14 LAB — GLUCOSE, CAPILLARY
GLUCOSE-CAPILLARY: 202 mg/dL — AB (ref 70–99)
Glucose-Capillary: 140 mg/dL — ABNORMAL HIGH (ref 70–99)
Glucose-Capillary: 145 mg/dL — ABNORMAL HIGH (ref 70–99)
Glucose-Capillary: 134 mg/dL — ABNORMAL HIGH (ref 70–99)
Glucose-Capillary: 115 mg/dL — ABNORMAL HIGH (ref 70–99)

## 2019-01-14 LAB — BASIC METABOLIC PANEL
Anion gap: 10 (ref 5–15)
BUN: 28 mg/dL — ABNORMAL HIGH (ref 8–23)
CO2: 34 mmol/L — ABNORMAL HIGH (ref 22–32)
Calcium: 9.2 mg/dL (ref 8.9–10.3)
Chloride: 92 mmol/L — ABNORMAL LOW (ref 98–111)
Creatinine, Ser: 0.82 mg/dL (ref 0.44–1.00)
GFR calc non Af Amer: >60 mL/min (ref >60)
Glucose, Bld: 236 mg/dL — ABNORMAL HIGH (ref 70–99)
POTASSIUM: 4.2 mmol/L (ref 3.5–5.1)
Sodium: 136 mmol/L (ref 135–145)

## 2019-01-14 MED ORDER — INSULIN NPH (HUMAN) (ISOPHANE) 100 UNIT/ML ~~LOC~~ SUSP
70.0000 [IU] | Freq: Two times a day (BID) | SUBCUTANEOUS | Status: DC
  Filled 2019-01-14: qty 10

## 2019-01-14 MED ORDER — DOCUSATE SODIUM 100 MG PO CAPS
100.0000 mg | ORAL_CAPSULE | Freq: Two times a day (BID) | ORAL | Status: DC | PRN
  Administered 2019-01-15: 100 mg via ORAL
  Filled 2019-01-14: qty 1

## 2019-01-14 MED ORDER — PREDNISONE 20 MG PO TABS
50.0000 mg | ORAL_TABLET | Freq: Every day | ORAL | Status: DC
  Administered 2019-01-15 – 2019-01-16 (×2): 50 mg via ORAL
  Filled 2019-01-14 (×2): qty 2

## 2019-01-14 MED ORDER — INSULIN NPH (HUMAN) (ISOPHANE) 100 UNIT/ML ~~LOC~~ SUSP
62.0000 [IU] | Freq: Two times a day (BID) | SUBCUTANEOUS | Status: AC
  Administered 2019-01-14: 62 [IU] via SUBCUTANEOUS
  Filled 2019-01-14: qty 10

## 2019-01-14 NOTE — Progress Notes (Signed)
Pt placed on CPAP for the night- tolerating well. 

## 2019-01-14 NOTE — Progress Notes (Signed)
Physical Therapy Treatment Patient Details Name: Bonnie Gonzalez MRN: [id number] DOB: [birth year] Today's Date: 01/14/2019    History of Present Illness Bonnie Gonzalez is a 63 y.o. female with a history of COPD, likely OSA, morbid obesity (BMI 53), poorly-controlled IDT2DM, and chronic HFpEF who presented to the ED with shortness of breath, wheezing, and cough with pleuritic chest pain. Work up in the ED included negative flu panel, troponin, CXR, ECG, and normal BNP. She was hypoxic and admitted for presumed COPD exacerbation.      PT Comments    Patient distressed over insurance complications, discussed with RN. From PT standpoint, patient progressing well today, increasing ambulation distance, stability, and less assistance needed now with SPC. Pt satting well on 3L O2 at this time. Will focus on stairs next PT visit. Updating recs to OP PT as patient has hip and back pain that would better be addressed in OP clinic, and she is no longer home bound. Daughter present and helpful.   Follow Up Recommendations  Supervision/Assistance - 24 hour;Outpatient PT     Equipment Recommendations  None recommended by PT    Recommendations for Other Services OT consult     Precautions / Restrictions Precautions Precautions: Fall Restrictions Weight Bearing Restrictions: No    Mobility  Bed Mobility Overal bed mobility: Modified Independent             General bed mobility comments: increaed time and effort  Transfers Overall transfer level: Needs assistance Equipment used: Straight cane Transfers: Sit to/from Stand Sit to Stand: Min assist            Ambulation/Gait Ambulation/Gait assistance: Supervision Gait Distance (Feet): 55 Feet Assistive device: Straight cane Gait Pattern/deviations: Step-to pattern;Step-through pattern Gait velocity: decreased   General Gait Details: pt with improved stregnth and stability today, ambulating with SPC vs RW. sats wel on 3L   Stairs             Wheelchair Mobility    Modified Rankin (Stroke Patients Only)       Balance                                            Cognition Arousal/Alertness: Awake/alert Behavior During Therapy: WFL for tasks assessed/performed Overall Cognitive Status: Within Functional Limits for tasks assessed                                        Exercises      General Comments        Pertinent Vitals/Pain Pain Assessment: Faces Faces Pain Scale: Hurts little more Pain Location: chronic back pain and hip pain Pain Descriptors / Indicators: Aching Pain Intervention(s): Limited activity within patient's tolerance;Monitored during session    Home Living                      Prior Function            PT Goals (current goals can now be found in the care plan section) Acute Rehab PT Goals Patient Stated Goal: go home with daugthers help PT Goal Formulation: With patient Time For Goal Achievement: 01/27/19 Potential to Achieve Goals: Good Progress towards PT goals: Progressing toward goals    Frequency    Min 3X/week  PT Plan Current plan remains appropriate    Co-evaluation              AM-PAC PT "6 Clicks" Mobility   Outcome Measure  Help needed turning from your back to your side while in a flat bed without using bedrails?: None Help needed moving from lying on your back to sitting on the side of a flat bed without using bedrails?: None Help needed moving to and from a bed to a chair (including a wheelchair)?: A Little Help needed standing up from a chair using your arms (e.g., wheelchair or bedside chair)?: A Little Help needed to walk in hospital room?: A Little Help needed climbing 3-5 steps with a railing? : A Lot 6 Click Score: 19    End of Session Equipment Utilized During Treatment: Gait belt;Oxygen Activity Tolerance: Patient limited by fatigue Patient left: in bed;with call bell/phone within  reach Nurse Communication: Mobility status PT Visit Diagnosis: Unsteadiness on feet (R26.81)     Time: 1000-1025 PT Time Calculation (min) (ACUTE ONLY): 25 min  Charges:  $Gait Training: 8-22 mins                     Reinaldo Berber, PT, DPT Acute Rehabilitation Services Pager: [phone] Office: [phone]     Reinaldo Berber 01/14/2019, 10:54 AM

## 2019-01-14 NOTE — Progress Notes (Signed)
PROGRESS NOTE    Bonnie Gonzalez  VPX:[id number] DOB: [birth year] DOA: 01/10/2019 PCP: Laurey Morale, MD     Brief Narrative:  Bonnie Gonzalez is a 63 y.o. female with a history of COPD, likely OSA, morbid obesity (BMI 53), poorly-controlled IDT2DM, and chronic HFpEF who presented to the ED with shortness of breath, wheezing, and cough with pleuritic chest pain. Work up in the ED included negative flu panel, troponin, CXR, ECG, and normal BNP. She was hypoxic and admitted for presumed COPD exacerbation.  New events last 24 hours / Subjective: Extended conversation with patient and daughter this morning.  Patient is very tearful, very overwhelmed and relates her depression, anxiety, obesity, respiratory status.  Her daughter also raises concern regarding self-harm, and patient became evasive regarding any statements said to daughter prior to my visit.   Assessment & Plan:   Principal Problem:   Acute respiratory failure with hypoxia (HCC) Active Problems:   Morbid obesity due to excess calories (HCC)   OSA (obstructive sleep apnea)   Type 2 diabetes mellitus without complications (HCC)   COPD GOLD II if use FEV1/VC   Postinflammatory pulmonary fibrosis (HCC)   Acute hypoxic respiratory failure -Due to COPD exacerbation on baseline with postinflammatory pulmonary fibrosis, morbid obesity, suspect OHS, deconditioning, very strong suspicion for OSA, though undiagnosed, and chronic HFpEF. D-dimer borderline so CTA chest performed, shows no significant abnormality, including no PE.  - Continue supplemental oxygen as needed to maintain SpO2 >88%. Wean O2 and obtain home desat screen   COPD exacerbation, postinflammatory pulmonary fibrosis - Wean to PO steroids   IDDM type 2, poorly-controlled with hyperglycemia and steroid-induced worsening of hyperglycemia: HbA1c 10% - Appreciate diabetic coordinator  - Dose of insulin NPH adjusted today due to hypoglycemic episode, continue sliding scale  insulin  Morbid obesity - Weight loss counseling provided   Suspected OSA - CPAP qHS  - Sleep study as outpatient  Chronic diastolic HF  - EF 4/62 with stable presreved EF and G1DD - Continue lasix PO   Chronic pain, DJD - Continue home naproxen  Hypothyroidism - Continue synthroid  Hyperlipidemia - Continue Lipitor  Depression/anxiety - Restart home vistaril, continue xanax, cymbalta - Patient very tearful throughout our extended conversation, states that she was on Zoloft for many years and was switched to Cymbalta less than a year ago.  She does not feel Cymbalta has been helpful.  Daughter also raise concerns for possibly self-harm statement that may have been made yesterday.  Discussed that it would be helpful to have psychiatry consulted while patient is in the hospital  Mild cognitive impairment - Continue aricept  GERD:  - Continue PPI   DVT prophylaxis: Lovenox Code Status: Full code Family Communication: At bedside Disposition Plan: Home health when clinically improved   Consultants:   Psychiatry  Procedures:   None   Antimicrobials:  Anti-infectives (From admission, onward)   None        Objective: Vitals:   01/14/19 0441 01/14/19 0500 01/14/19 0718 01/14/19 0756  BP: (!) 165/80   (!) 129/55  Pulse: 66   61  Resp: 20   16  Temp: 97.6 F (36.4 C)   (!) 97.5 F (36.4 C)  TempSrc: Oral   Oral  SpO2: 96%  96% 97%  Weight:  120.4 kg    Height:        Intake/Output Summary (Last 24 hours) at 01/14/2019 1238 Last data filed at 01/14/2019 1100 Gross per 24 hour  Intake  600 ml  Output 900 ml  Net -300 ml   Filed Weights   01/11/19 0100 01/13/19 0429 01/14/19 0500  Weight: 124.7 kg 126.5 kg 120.4 kg    Examination:  General exam: Appears calm and comfortable  Respiratory system: Diminished breath sounds without wheezing  Cardiovascular system: S1 & S2 heard, RRR. No JVD, murmurs, rubs, gallops or clicks. No pedal  edema. Gastrointestinal system: Abdomen is nondistended, soft and nontender. No organomegaly or masses felt. Normal bowel sounds heard. Central nervous system: Alert and oriented. No focal neurological deficits. Extremities: Symmetric 5 x 5 power. Skin: No rashes, lesions or ulcers Psychiatry: Judgement and insight appear normal. Mood & affect appropriate.  Very tearful throughout our conversation   Data Reviewed: I have personally reviewed following labs and imaging studies  CBC: Recent Labs  Lab 01/10/19 1425 01/11/19 0221 01/12/19 0128 01/14/19 0750  WBC 8.7 10.7* 16.9* 15.0*  HGB 12.3 12.5 12.0 12.2  HCT 44.0 43.6 40.3 42.6  MCV 86.1 84.2 83.3 84.0  PLT 157 173 180 025   Basic Metabolic Panel: Recent Labs  Lab 01/10/19 1425 01/11/19 0221 01/12/19 0304 01/14/19 0326  NA 138 135 133* 136  K 4.7 4.9 5.1 4.2  CL 96* 93* 92* 92*  CO2 34* 28 31 34*  GLUCOSE 260* 410* 353* 236*  BUN 12 13 18  28*  CREATININE 0.66 0.79 0.77 0.82  CALCIUM 9.1 9.4 9.3 9.2   GFR: Estimated Creatinine Clearance: 84.8 mL/min (by C-G formula based on SCr of 0.82 mg/dL). Liver Function Tests: No results for input(s): AST, ALT, ALKPHOS, BILITOT, PROT, ALBUMIN in the last 168 hours. No results for input(s): LIPASE, AMYLASE in the last 168 hours. No results for input(s): AMMONIA in the last 168 hours. Coagulation Profile: No results for input(s): INR, PROTIME in the last 168 hours. Cardiac Enzymes: Recent Labs  Lab 01/10/19 1721 01/11/19 1415 01/11/19 1945 01/12/19 0128  TROPONINI <0.03 <0.03 <0.03 <0.03   BNP (last 3 results) Recent Labs    08/31/18 1718 10/25/18 1655  PROBNP 10.0 41.0   HbA1C: No results for input(s): HGBA1C in the last 72 hours. CBG: Recent Labs  Lab 01/13/19 1227 01/13/19 1651 01/13/19 2109 01/14/19 0805 01/14/19 1011  GLUCAP 197* 70 121* 140* 202*   Lipid Profile: No results for input(s): CHOL, HDL, LDLCALC, TRIG, CHOLHDL, LDLDIRECT in the last 72  hours. Thyroid Function Tests: No results for input(s): TSH, T4TOTAL, FREET4, T3FREE, THYROIDAB in the last 72 hours. Anemia Panel: No results for input(s): VITAMINB12, FOLATE, FERRITIN, TIBC, IRON, RETICCTPCT in the last 72 hours. Sepsis Labs: No results for input(s): PROCALCITON, LATICACIDVEN in the last 168 hours.  No results found for this or any previous visit (from the past 240 hour(s)).     Radiology Studies: No results found.    Scheduled Meds: . albuterol  2.5 mg Nebulization TID  . ALPRAZolam  0.5-1 mg Oral BID  . atorvastatin  40 mg Oral QHS  . donepezil  10 mg Oral QHS  . DULoxetine  60 mg Oral QHS  . enoxaparin (LOVENOX) injection  60 mg Subcutaneous Q24H  . furosemide  40 mg Oral Daily  . insulin aspart  0-20 Units Subcutaneous TID WC  . insulin aspart  0-5 Units Subcutaneous QHS  . insulin aspart  14 Units Subcutaneous TID WC  . levothyroxine  175 mcg Oral Q0600  . mouth rinse  15 mL Mouth Rinse q12n4p  . methylPREDNISolone (SOLU-MEDROL) injection  40 mg Intravenous Q12H  .  naproxen  500 mg Oral BID WC  . pantoprazole  40 mg Oral Daily   Continuous Infusions:   LOS: 3 days    Time spent: 55 minutes   Dessa Phi, DO [facility] [email or web address] 01/14/2019, 12:38 PM

## 2019-01-14 NOTE — Progress Notes (Signed)
Spoke to provider on call Baltazar Najjar) regarding CBG 115 and scheduled NPH 70 units due.  Orders received to hold NPH and recheck CBG at 2400 to reassess levels.

## 2019-01-14 NOTE — Care Management Note (Signed)
Case Management Note  Patient Details  Name: Bonnie Gonzalez MRN: [id number] Date of Birth: 1956-08-08  Subjective/Objective:  From home, daughter at bedside, patient will need outpatient pt, NCM set patient up with [facility] outpatient thru [facility], but patient states they may not be in network , so NCM asked MD to give patient a script for outpatient pt so that if they are out of network she can find one in network and take the script with her to get therapy.  Also she will need outpatient diabetic education.  NCM contacted diabetic educator to get this initiated. They will contact MD for order.                 Action/Plan: NCM will follow for transition of care needs.  Expected Discharge Date:  01/12/19               Expected Discharge Plan:  OP Rehab  In-House Referral:     Discharge planning Services  CM Consult  Post Acute Care Choice:    Choice offered to:     DME Arranged:    DME Agency:     HH Arranged:    HH Agency:     Status of Service:  Completed, signed off  If discussed at [employer] of Stay Meetings, dates discussed:    Additional Comments:  Zenon Mayo, RN 01/14/2019, 5:07 PM

## 2019-01-14 NOTE — Progress Notes (Signed)
Inpatient Diabetes Program Recommendations  AACE/ADA: New Consensus Statement on Inpatient Glycemic Control (2015)  Target Ranges:  Prepandial:   less than 140 mg/dL      Peak postprandial:   less than 180 mg/dL (1-2 hours)      Critically ill patients:  140 - 180 mg/dL   Lab Results  Component Value Date   GLUCAP 140 (H) 01/14/2019   HGBA1C 10.0 (H) 01/11/2019    Review of Glycemic Control Results for AQUITA, SIMMERING (MRN [id number]) as of 01/14/2019 09:15  Ref. Range 01/13/2019 12:27 01/13/2019 16:51 01/13/2019 21:09 01/14/2019 08:05  Glucose-Capillary Latest Ref Range: 70 - 99 mg/dL 197 (H) 70 121 (H) 140 (H)   Diabetes history:Type 2 DM Outpatient Diabetes medications:NPH- 350 units QAM, 30 units QPM Current orders for Inpatient glycemic control:NPH 125 units BID, Novolog 0-20 units TID, Novolog 14 units TID, Novolog 0-5 units QHS  Solumedrol 40 mg Q12H  Inpatient Diabetes Program Recommendations:  -Noted insulin timing from 2/13 given 3 hours following CBG of 239 mg/dL, insulin needs to be administered within 1 hour of last CBG. -Consider increasing NPH to 70 units BID.   Reached out to Dr Maylene Roes about application of [employer], orders received for application. Sensor applied at 1500. Patient educated on application, using meter, when to go to pharmacy, benefits, and when to scan meter. Patient has no further questions.   Thanks, Bronson Curb, MSN, RNC-OB Diabetes Coordinator [phone] (8a-5p)

## 2019-01-14 NOTE — Consult Note (Signed)
[facility] Consult   Reason for Consult:  "Depression, very tearful, home med cymbalta not helping, daughter alluded to maybe suicidal thoughts but patient evasive." Referring Physician:  Dr. Maylene Roes Patient Identification: Maribel I Yarbro MRN:  [id number] Principal Diagnosis: MDD (major depressive disorder), recurrent severe, without psychosis ([place]) Diagnosis:  Principal Problem:   Acute respiratory failure with hypoxia ([place]) Active Problems:   Morbid obesity due to excess calories (HCC)   OSA (obstructive sleep apnea)   Type 2 diabetes mellitus without complications (HCC)   COPD GOLD II if use FEV1/VC   Postinflammatory pulmonary fibrosis ([place])   Total Time spent with patient: 1 hour  Subjective:   Paytyn I Azbell is a 63 y.o. female patient admitted with acute respiratory failure with hypoxia.  HPI:   Per chart review, patient was admitted with acute respiratory failure with hypoxia due to COPD exacerbation. Medical history is also significant for morbid obesity, possible OSA and mild cognitive impairment. She has been using a CPAP in the hospital with good effect. She has a history of depression and anxiety. Home medications include Xanax 0.5-1 mg BID, Aricept 10 mg qhs and Cymbalta 60 mg qhs. She is receiving Atarax 25 mg TID PRN in the hospital for anxiety. PMP indicates that she last filled Restoril 30 mg tablets (#30) on 12/31, Xanax 0.5 mg tablets (#60) on 12/31 and Phentermine 37.5 mg tablets (#30) on 12/13. Her medications are prescribed by Dr. [name]. Her Cymbalta is ineffective for her mood. She was switched from Zoloft to Cymbalta a year ago. She made a suicidal statement to her daughter yesterday.   On interview, Ms. Iannuzzi reports that she has been depressed for several years.  She reports worsening depression in the past 4-5 months due to her mother passing away.  She does not remember the last time she felt like she was doing well.  She does report remembering  a short period of time when she felt happy 20 years ago before her divorce.  She reports isolation to her house due to panic in a public setting.  She has recently been able to take small steps to go to public places.  She visited her church and ate at a fast [employer].  She reports a history of panic attacks.  She endorses forgetfulness more recently with lack of retention of information and forgetting names and words.  She endorses fatigue.  She requires naps at work and sometimes when she drives home she may have to pull over for a nap.  She was recently started on CPAP in the hospital and reports a significant improvement in her sleep.  She reports completing an at home sleep study and was supposed to follow up with an overnight sleep study in the hospital although she lost her insurance and never completed one.  She endorses irritability for the past 8-9 months.  Her medications were previously managed by her PCP but she is unable to see him any longer due to a change in her insurance.  He last increased Zoloft but she did not feel like it was any more effective so it was changed to Cymbalta.  She does not feel like her mood has improved.  She discussed her current stressors in detail which include family and work strain.  She feels like her family do not appreciate her advice on raising her grandchildren.  She denies current SI, HI or AVH.  She does report intermittent thoughts such as "What's the point."  She denies any intention to harm herself.  She denies a history of suicide attempts.  She is able to safety plan.  She does not have access to guns or weapons at home.  Her daughter is at bedside with her verbal consent.  Her daughter denies any safety concerns for her to return home.  She believes that she will benefit significantly from therapy to develop healthy coping skills to manage her stressors.  Patient reports that she did therapy in the past although she was lost to follow up due to feeling  vulnerable with discussing her history.   Past Psychiatric History: Depression and anxiety.   Risk to Self:  None. Denies SI.  Risk to Others:  None. Denies HI.  Prior Inpatient Therapy:  Denies  Prior Outpatient Therapy:  Her medications were previously managed by her PCP, Dr. Sarajane Jews.   Past Medical History:  Past Medical History:  Diagnosis Date  . Allergy   . Anxiety   . Arthritis   . Depression   . Diabetes mellitus   . GERD (gastroesophageal reflux disease)   . Hypothyroidism   . IBS (irritable bowel syndrome)   . Insomnia     Past Surgical History:  Procedure Laterality Date  . benign tumor      removed from groin  . WRIST ARTHROCENTESIS N/A 2001   Family History:  Family History  Problem Relation Age of Onset  . Asthma Other        fhx  . Depression Other        fhx  . Diabetes Other        fhx  . Parkinsonism Other        fhx  . Lupus Other        fhx  . Alzheimer's disease Father        Deceased, 81  . Diabetes Mellitus II Father   . Arthritis/Rheumatoid Mother        Living, 55  . Diabetes Mellitus II Sister   . Diabetes Mellitus II Brother    Family Psychiatric  History: See above and patient reports a significant family history of suicide attempts on her mother's side. Father-alcoholism.   Social History:  Social History   Substance and Sexual Activity  Alcohol Use No  . Alcohol/week: 0.0 standard drinks     Social History   Substance and Sexual Activity  Drug Use No    Social History   Socioeconomic History  . Marital status: Divorced    Spouse name: Not on file  . Number of children: Not on file  . Years of education: Not on file  . Highest education level: Not on file  Occupational History  . Not on file  Social Needs  . Financial resource strain: Not on file  . Food insecurity:    Worry: Not on file    Inability: Not on file  . Transportation needs:    Medical: Not on file    Non-medical: Not on file  Tobacco Use  .  Smoking status: Former Smoker    Packs/day: 1.00    Years: 35.00    Pack years: 35.00    Types: Cigarettes    Last attempt to quit: 12/01/2008    Years since quitting: 10.1  . Smokeless tobacco: Never Used  Substance and Sexual Activity  . Alcohol use: No    Alcohol/week: 0.0 standard drinks  . Drug use: No  . Sexual activity: Not Currently  Lifestyle  . Physical activity:  Days per week: Not on file    Minutes per session: Not on file  . Stress: Not on file  Relationships  . Social connections:    Talks on phone: Not on file    Gets together: Not on file    Attends religious service: Not on file    Active member of club or organization: Not on file    Attends meetings of clubs or organizations: Not on file    Relationship status: Not on file  Other Topics Concern  . Not on file  Social History Narrative   Lives alone.   She has four grown children.   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   Highest level of education:  1.5 years of college   Additional Social History: She lives at home. She is divorced. Her daughter and granddaughter live with her. They have been at her home for a year. She works as a Marketing executive at [employer]. She is currently on FMLA leave.     Allergies:   Allergies  Allergen Reactions  . Aspirin Anaphylaxis    Mother had to be resuscitated after ingesting this  . Codeine Hives  . Fluoxetine Hcl Itching and Other (See Comments)    "Hair itching"  . Hydrocodone-Acetaminophen Hives  . Oxycodone-Acetaminophen Hives  . Penicillins Hives    Did it involve swelling of the face/tongue/throat, SOB, or low BP? Yes-hives Did it involve sudden or severe rash/hives, skin peeling, or any reaction on the inside of your mouth or nose? Unk Did you need to seek medical attention at a hospital or doctor's office? Yes When did it last happen? Was in her 5's If all above answers are "NO", may proceed with cephalosporin use.     Labs:   Results for orders placed or performed during the hospital encounter of 01/10/19 (from the past 48 hour(s))  Glucose, capillary     Status: Abnormal   Collection Time: 01/12/19  1:49 PM  Result Value Ref Range   Glucose-Capillary 372 (H) 70 - 99 mg/dL  Glucose, capillary     Status: Abnormal   Collection Time: 01/12/19  4:55 PM  Result Value Ref Range   Glucose-Capillary 256 (H) 70 - 99 mg/dL  Glucose, capillary     Status: Abnormal   Collection Time: 01/12/19  9:05 PM  Result Value Ref Range   Glucose-Capillary 236 (H) 70 - 99 mg/dL  Glucose, capillary     Status: Abnormal   Collection Time: 01/13/19  7:28 AM  Result Value Ref Range   Glucose-Capillary 239 (H) 70 - 99 mg/dL  Glucose, capillary     Status: Abnormal   Collection Time: 01/13/19 12:27 PM  Result Value Ref Range   Glucose-Capillary 197 (H) 70 - 99 mg/dL  Glucose, capillary     Status: None   Collection Time: 01/13/19  4:51 PM  Result Value Ref Range   Glucose-Capillary 70 70 - 99 mg/dL  Glucose, capillary     Status: Abnormal   Collection Time: 01/13/19  9:09 PM  Result Value Ref Range   Glucose-Capillary 121 (H) 70 - 99 mg/dL  Basic metabolic panel     Status: Abnormal   Collection Time: 01/14/19  3:26 AM  Result Value Ref Range   Sodium 136 135 - 145 mmol/L   Potassium 4.2 3.5 - 5.1 mmol/L   Chloride 92 (L) 98 - 111 mmol/L   CO2 34 (H) 22 - 32 mmol/L   Glucose, Bld 236 (  H) 70 - 99 mg/dL   BUN 28 (H) 8 - 23 mg/dL   Creatinine, Ser 0.82 0.44 - 1.00 mg/dL   Calcium 9.2 8.9 - 10.3 mg/dL   GFR calc non Af Amer >60 >60 mL/min   GFR calc Af Amer >60 >60 mL/min   Anion gap 10 5 - 15    Comment: Performed at [facility] [address] [place], [place] [address]  CBC     Status: Abnormal   Collection Time: 01/14/19  7:50 AM  Result Value Ref Range   WBC 15.0 (H) 4.0 - 10.5 K/uL   RBC 5.07 3.87 - 5.11 MIL/uL   Hemoglobin 12.2 12.0 - 15.0 g/dL   HCT 42.6 36.0 - 46.0 %   MCV 84.0 80.0 - 100.0 fL   MCH  24.1 (L) 26.0 - 34.0 pg   MCHC 28.6 (L) 30.0 - 36.0 g/dL   RDW 16.6 (H) 11.5 - 15.5 %   Platelets 165 150 - 400 K/uL   nRBC 0.0 0.0 - 0.2 %    Comment: Performed at [facility], [place] [address], [place], [place] [address]  Glucose, capillary     Status: Abnormal   Collection Time: 01/14/19  8:05 AM  Result Value Ref Range   Glucose-Capillary 140 (H) 70 - 99 mg/dL  Glucose, capillary     Status: Abnormal   Collection Time: 01/14/19 10:11 AM  Result Value Ref Range   Glucose-Capillary 202 (H) 70 - 99 mg/dL    Current Facility-Administered Medications  Medication Dose Route Frequency Provider Last Rate Last Dose  . acetaminophen (TYLENOL) tablet 650 mg  650 mg Oral Q6H PRN Rise Patience, MD       Or  . acetaminophen (TYLENOL) suppository 650 mg  650 mg Rectal Q6H PRN Rise Patience, MD      . albuterol (PROVENTIL) (2.5 MG/3ML) 0.083% nebulizer solution 2.5 mg  2.5 mg Nebulization TID Patrecia Pour, MD   2.5 mg at 01/14/19 0718  . ALPRAZolam Duanne Moron) tablet 0.5-1 mg  0.5-1 mg Oral BID Rise Patience, MD   1 mg at 01/14/19 1035  . atorvastatin (LIPITOR) tablet 40 mg  40 mg Oral QHS Rise Patience, MD   40 mg at 01/13/19 2124  . benzonatate (TESSALON) capsule 100 mg  100 mg Oral TID PRN Radene Gunning, NP   100 mg at 01/12/19 0843  . docusate sodium (COLACE) capsule 100 mg  100 mg Oral BID PRN Dessa Phi, DO      . donepezil (ARICEPT) tablet 10 mg  10 mg Oral QHS Rise Patience, MD   10 mg at 01/13/19 2124  . DULoxetine (CYMBALTA) DR capsule 60 mg  60 mg Oral QHS Rise Patience, MD   60 mg at 01/13/19 2124  . enoxaparin (LOVENOX) injection 60 mg  60 mg Subcutaneous Q24H Rise Patience, MD   60 mg at 01/13/19 1359  . furosemide (LASIX) tablet 40 mg  40 mg Oral Daily Patrecia Pour, MD   40 mg at 01/14/19 1035  . hydrOXYzine (ATARAX/VISTARIL) tablet 25 mg  25 mg Oral TID PRN Patrecia Pour, MD   25 mg at 01/14/19 0217  . insulin aspart  (novoLOG) injection 0-20 Units  0-20 Units Subcutaneous TID WC Vita Erm, NP   7 Units at 01/14/19 1034  . insulin aspart (novoLOG) injection 0-5 Units  0-5 Units Subcutaneous QHS Vita Erm, NP  2 Units at 01/12/19 2234  . insulin aspart (novoLOG) injection 14 Units  14 Units Subcutaneous TID WC Patrecia Pour, MD   14 Units at 01/14/19 1033  . levothyroxine (SYNTHROID, LEVOTHROID) tablet 175 mcg  175 mcg Oral Q0600 Rise Patience, MD   175 mcg at 01/14/19 0631  . MEDLINE mouth rinse  15 mL Mouth Rinse q12n4p Rise Patience, MD   15 mL at 01/13/19 1800  . methocarbamol (ROBAXIN) tablet 750 mg  750 mg Oral Q6H PRN Rise Patience, MD      . methylPREDNISolone sodium succinate (SOLU-MEDROL) 40 mg/mL injection 40 mg  40 mg Intravenous Q12H Vance Gather B, MD   40 mg at 01/14/19 5102  . naproxen (NAPROSYN) tablet 500 mg  500 mg Oral BID WC Patrecia Pour, MD   500 mg at 01/14/19 1035  . ondansetron (ZOFRAN) tablet 4 mg  4 mg Oral Q6H PRN Rise Patience, MD       Or  . ondansetron [facility]) injection 4 mg  4 mg Intravenous Q6H PRN Rise Patience, MD      . pantoprazole (PROTONIX) EC tablet 40 mg  40 mg Oral Daily Rise Patience, MD   40 mg at 01/14/19 1035  . temazepam (RESTORIL) capsule 30 mg  30 mg Oral QHS PRN Rise Patience, MD        Musculoskeletal: Strength & Muscle Tone: within normal limits Gait & Station: UTA since patient is sitting in bed. Patient leans: N/A  Psychiatric Specialty Exam: Physical Exam  Nursing note and vitals reviewed. Constitutional: She is oriented to person, place, and time. She appears well-developed and well-nourished.  HENT:  Head: Normocephalic and atraumatic.  Neck: Normal range of motion.  Respiratory: Effort normal.  Musculoskeletal: Normal range of motion.  Neurological: She is alert and oriented to person, place, and time.  Psychiatric: Her speech is normal and behavior is normal.  Judgment and thought content normal. Cognition and memory are impaired. She exhibits a depressed mood.    Review of Systems  Respiratory: Positive for cough.   Gastrointestinal: Positive for constipation. Negative for abdominal pain, diarrhea, nausea and vomiting.  Psychiatric/Behavioral: Positive for depression. Negative for hallucinations and suicidal ideas. The patient is nervous/anxious and has insomnia.   All other systems reviewed and are negative.   Blood pressure (!) 129/55, pulse 61, temperature (!) 97.5 F (36.4 C), temperature source Oral, resp. rate 16, height 5' (1.524 m), weight 120.4 kg, SpO2 97 %.Body mass index is 51.85 kg/m.  General Appearance: Fairly Groomed, middle aged, Caucasian female, wearing a hospital gown with short hair and corrective lenses who is sitting in bed. NAD.   Eye Contact:  Good  Speech:  Clear and Coherent and Normal Rate  Volume:  Normal  Mood:  Depressed  Affect:  Congruent and Tearful  Thought Process:  Goal Directed, Linear and Descriptions of Associations: Intact  Orientation:  Full (Time, Place, and Person)  Thought Content:  Logical  Suicidal Thoughts:  No  Homicidal Thoughts:  No  Memory:  Immediate;   Good Recent;   Good Remote;   Good  Judgement:  Fair  Insight:  Fair  Psychomotor Activity:  Normal  Concentration:  Concentration: Good and Attention Span: Good  Recall:  Good  Fund of Knowledge:  Good  Language:  Good  Akathisia:  No  Handed:  Right  AIMS (if indicated):   N/A  Assets:  Communication Skills Desire for Improvement Financial  Resources/Insurance Housing Resilience Social Support  ADL's:  Intact  Cognition:  Impaired with short term memory deficits.   Sleep:   Improved with CPAP use since hospitalization.    Assessment:  Thecla I Tuff is a 63 y.o. female who was admitted with acute respiratory failure with hypoxia due to COPD exacerbation. Patient endorses a history of chronic depression with worsening mood over  several months. She identifies several stressors including the loss of her mother, work and family strain. She requests assistance with obtaining outpatient mental health resources for medication management and therapy. She denies SI, HI or AVH. She denies any intention to harm self and is able to safety plan. Her daughter is at bedside and she denies any safety concerns.   Treatment Plan Summary: -Continue home medications: Xanax 0.5-1 mg BID for anxiety, Aricept 10 mg qhs for MCI and Cymbalta 60 mg qhs for mood and anxiety.  -EKG reviewed and QTc 432 on 2/10. Please closely monitor when starting or increasing QTc prolonging agents.  -Please have SW provide patient with outpatient mental health resources. Mentioned to patient and her daughter about [email or web address] and [facility] [place].  -Psychiatry will sign off on patient at this time. Please consult psychiatry again as needed.    Disposition: No evidence of imminent risk to self or others at present.    Faythe Dingwall, DO 01/14/2019 12:41 PM

## 2019-01-15 LAB — GLUCOSE, CAPILLARY
GLUCOSE-CAPILLARY: 86 mg/dL (ref 70–99)
Glucose-Capillary: 66 mg/dL — ABNORMAL LOW (ref 70–99)
Glucose-Capillary: 69 mg/dL — ABNORMAL LOW (ref 70–99)
Glucose-Capillary: 115 mg/dL — ABNORMAL HIGH (ref 70–99)
Glucose-Capillary: 178 mg/dL — ABNORMAL HIGH (ref 70–99)
Glucose-Capillary: 187 mg/dL — ABNORMAL HIGH (ref 70–99)
Glucose-Capillary: 225 mg/dL — ABNORMAL HIGH (ref 70–99)

## 2019-01-15 LAB — CBC
HCT: 44.8 % (ref 36.0–46.0)
Hemoglobin: 13.3 g/dL (ref 12.0–15.0)
MCH: 25 pg — ABNORMAL LOW (ref 26.0–34.0)
MCHC: 29.7 g/dL — ABNORMAL LOW (ref 30.0–36.0)
MCV: 84.1 fL (ref 80.0–100.0)
Platelets: 183 10*3/uL (ref 150–400)
RBC: 5.33 MIL/uL — ABNORMAL HIGH (ref 3.87–5.11)
RDW: 16.5 % — ABNORMAL HIGH (ref 11.5–15.5)
WBC: 17.9 10*3/uL — ABNORMAL HIGH (ref 4.0–10.5)
nRBC: 0.2 % (ref 0.0–0.2)

## 2019-01-15 LAB — BASIC METABOLIC PANEL
ANION GAP: 11 (ref 5–15)
BUN: 24 mg/dL — ABNORMAL HIGH (ref 8–23)
CO2: 38 mmol/L — ABNORMAL HIGH (ref 22–32)
Calcium: 9 mg/dL (ref 8.9–10.3)
Chloride: 90 mmol/L — ABNORMAL LOW (ref 98–111)
Creatinine, Ser: 0.82 mg/dL (ref 0.44–1.00)
GFR calc Af Amer: >60 mL/min (ref >60)
GFR calc non Af Amer: >60 mL/min (ref >60)
GLUCOSE: 140 mg/dL — AB (ref 70–99)
Potassium: 3.5 mmol/L (ref 3.5–5.1)
Sodium: 139 mmol/L (ref 135–145)

## 2019-01-15 MED ORDER — INSULIN ASPART 100 UNIT/ML ~~LOC~~ SOLN
7.0000 [IU] | Freq: Three times a day (TID) | SUBCUTANEOUS | Status: DC
  Administered 2019-01-15 – 2019-01-16 (×3): 7 [IU] via SUBCUTANEOUS

## 2019-01-15 MED ORDER — INSULIN NPH (HUMAN) (ISOPHANE) 100 UNIT/ML ~~LOC~~ SUSP
50.0000 [IU] | Freq: Two times a day (BID) | SUBCUTANEOUS | Status: DC
  Administered 2019-01-15 – 2019-01-16 (×2): 50 [IU] via SUBCUTANEOUS
  Filled 2019-01-15: qty 10

## 2019-01-15 MED ORDER — LIVING WELL WITH DIABETES BOOK
Freq: Once | Status: AC
  Administered 2019-01-15: 18:00:00
  Filled 2019-01-15: qty 1

## 2019-01-15 NOTE — Progress Notes (Signed)
SATURATION QUALIFICATIONS: (This note is used to comply with regulatory documentation for home oxygen)  Patient Saturations on Room Air at Rest = 90%  Patient Saturations on Room Air while Ambulating = 85%  Patient Saturations on 2 Liters of oxygen while Ambulating = 93%  Please briefly explain why patient needs home oxygen: Oxygen drops as low as 85% while ambulating on RA.

## 2019-01-15 NOTE — Progress Notes (Addendum)
CSW met with patient at bedside and provided resources. Patient was alert and oriented. Patient's daughter and grand child were also present. Patient stated that she is having some health issues and is feeling very emotional.   CSW spoke with the patient and provided out patient resources.   CSW signing off.   Domenic Schwab, MSW, LCSW-A Clinical Social Worker [place] [facility]    __________________________  CSW acknowledges consult. CSW will complete an assessment with the patient and provide out patient resources.   CSW will continue to follow.   Domenic Schwab, MSW, [place]

## 2019-01-15 NOTE — Progress Notes (Signed)
PROGRESS NOTE    Bonnie Gonzalez  JJK:[id number] DOB: 1955/12/03 DOA: 01/10/2019 PCP: Laurey Morale, MD     Brief Narrative:  Bonnie Gonzalez is a 63 y.o. female with a history of COPD, likely OSA, morbid obesity (BMI 53), poorly-controlled IDT2DM, and chronic HFpEF who presented to the ED with shortness of breath, wheezing, and cough with pleuritic chest pain. Work up in the ED included negative flu panel, troponin, CXR, ECG, and normal BNP. She was hypoxic and admitted for presumed COPD exacerbation.  New events last 24 hours / Subjective: No new complaints, discussed diet and need for increase mobility today. Had blood sugar drop to the 60s overnight.   Assessment & Plan:   Principal Problem:   Acute respiratory failure with hypoxia (HCC) Active Problems:   Morbid obesity due to excess calories (HCC)   OSA (obstructive sleep apnea)   Type 2 diabetes mellitus without complications (HCC)   COPD GOLD II if use FEV1/VC   Postinflammatory pulmonary fibrosis (HCC)   MDD (major depressive disorder), recurrent severe, without psychosis (Bonnie Gonzalez)   Acute hypoxic respiratory failure -Due to COPD exacerbation on baseline with postinflammatory pulmonary fibrosis, morbid obesity, suspect OHS, deconditioning, very strong suspicion for OSA, though undiagnosed, and chronic HFpEF. D-dimer borderline so CTA chest performed, shows no significant abnormality, including no PE.  - Continue supplemental oxygen as needed to maintain SpO2 >88%. Will need home O2 on discharge, ordered.   COPD exacerbation, postinflammatory pulmonary fibrosis - Wean to PO steroids   IDDM type 2, poorly-controlled with hyperglycemia and steroid-induced worsening of hyperglycemia: HbA1c 10% - Appreciate diabetic coordinator. Ordered outpatient referral for nutrition and diabetic ed. Freestyle Libre sensor applied  - Had further hypoglycemic episode overnight down to 66. NPH was held.  Discussed with diabetic coordinator today,  will further adjust dosing to avoid hypoglycemia.   Morbid obesity - Weight loss counseling provided   Suspected OSA - CPAP qHS  - Sleep study as outpatient  Chronic diastolic HF  - EF 3/71 with stable presreved EF and G1DD - Continue lasix PO   Chronic pain, DJD - Continue home naproxen  Hypothyroidism - Continue synthroid  Hyperlipidemia - Continue Lipitor  Depression/anxiety - Restart home vistaril, continue xanax, cymbalta - Appreciate Psych. Consulted SW to provide patient with outpatient mental health resources.   Mild cognitive impairment - Continue aricept  GERD:  - Continue PPI   DVT prophylaxis: Lovenox Code Status: Full code Family Communication: Spoke with daughter over the phone  Disposition Plan: Home health with O2, hopefully 2/16 if blood sugars well controlled    Consultants:   Psychiatry  Procedures:   None   Antimicrobials:  Anti-infectives (From admission, onward)   None       Objective: Vitals:   01/14/19 2006 01/14/19 2252 01/15/19 0746 01/15/19 0757  BP:  (!) 143/67 (!) 152/75   Pulse:  62 (!) 59   Resp:  (!) 22 16   Temp:   97.7 F (36.5 C)   TempSrc:   Oral   SpO2: 95% 97% 100% 94%  Weight:      Height:        Intake/Output Summary (Last 24 hours) at 01/15/2019 1249 Last data filed at 01/14/2019 2100 Gross per 24 hour  Intake 360 ml  Output -  Net 360 ml   Filed Weights   01/11/19 0100 01/13/19 0429 01/14/19 0500  Weight: 124.7 kg 126.5 kg 120.4 kg    Examination: General exam: Appears calm and  comfortable  Respiratory system: Diminished but clear Cardiovascular system: S1 & S2 heard, RRR. No JVD, murmurs, rubs, gallops or clicks. No pedal edema. Gastrointestinal system: Abdomen is nondistended, soft and nontender. No organomegaly or masses felt. Normal bowel sounds heard. Central nervous system: Alert and oriented. No focal neurological deficits. Extremities: Symmetric 5 x 5 power. Skin: No rashes,  lesions or ulcers Psychiatry: Judgement and insight appear normal. Mood & affect appropriate.    Data Reviewed: I have personally reviewed following labs and imaging studies  CBC: Recent Labs  Lab 01/10/19 1425 01/11/19 0221 01/12/19 0128 01/14/19 0750 01/15/19 0324  WBC 8.7 10.7* 16.9* 15.0* 17.9*  HGB 12.3 12.5 12.0 12.2 13.3  HCT 44.0 43.6 40.3 42.6 44.8  MCV 86.1 84.2 83.3 84.0 84.1  PLT 157 173 180 165 563   Basic Metabolic Panel: Recent Labs  Lab 01/10/19 1425 01/11/19 0221 01/12/19 0304 01/14/19 0326 01/15/19 0324  NA 138 135 133* 136 139  K 4.7 4.9 5.1 4.2 3.5  CL 96* 93* 92* 92* 90*  CO2 34* 28 31 34* 38*  GLUCOSE 260* 410* 353* 236* 140*  BUN 12 13 18  28* 24*  CREATININE 0.66 0.79 0.77 0.82 0.82  CALCIUM 9.1 9.4 9.3 9.2 9.0   GFR: Estimated Creatinine Clearance: 84.8 mL/min (by C-G formula based on SCr of 0.82 mg/dL). Liver Function Tests: No results for input(s): AST, ALT, ALKPHOS, BILITOT, PROT, ALBUMIN in the last 168 hours. No results for input(s): LIPASE, AMYLASE in the last 168 hours. No results for input(s): AMMONIA in the last 168 hours. Coagulation Profile: No results for input(s): INR, PROTIME in the last 168 hours. Cardiac Enzymes: Recent Labs  Lab 01/10/19 1721 01/11/19 1415 01/11/19 1945 01/12/19 0128  TROPONINI <0.03 <0.03 <0.03 <0.03   BNP (last 3 results) Recent Labs    08/31/18 1718 10/25/18 1655  PROBNP 10.0 41.0   HbA1C: No results for input(s): HGBA1C in the last 72 hours. CBG: Recent Labs  Lab 01/15/19 0138 01/15/19 0226 01/15/19 0302 01/15/19 0748 01/15/19 1155  GLUCAP 69* 66* 115* 86 178*   Lipid Profile: No results for input(s): CHOL, HDL, LDLCALC, TRIG, CHOLHDL, LDLDIRECT in the last 72 hours. Thyroid Function Tests: No results for input(s): TSH, T4TOTAL, FREET4, T3FREE, THYROIDAB in the last 72 hours. Anemia Panel: No results for input(s): VITAMINB12, FOLATE, FERRITIN, TIBC, IRON, RETICCTPCT in the last  72 hours. Sepsis Labs: No results for input(s): PROCALCITON, LATICACIDVEN in the last 168 hours.  No results found for this or any previous visit (from the past 240 hour(s)).     Radiology Studies: No results found.    Scheduled Meds: . albuterol  2.5 mg Nebulization TID  . ALPRAZolam  0.5-1 mg Oral BID  . atorvastatin  40 mg Oral QHS  . donepezil  10 mg Oral QHS  . DULoxetine  60 mg Oral QHS  . enoxaparin (LOVENOX) injection  60 mg Subcutaneous Q24H  . furosemide  40 mg Oral Daily  . insulin aspart  0-20 Units Subcutaneous TID WC  . insulin aspart  0-5 Units Subcutaneous QHS  . insulin aspart  7 Units Subcutaneous TID WC  . insulin NPH Human  50 Units Subcutaneous BID AC & HS  . levothyroxine  175 mcg Oral Q0600  . mouth rinse  15 mL Mouth Rinse q12n4p  . naproxen  500 mg Oral BID WC  . pantoprazole  40 mg Oral Daily  . predniSONE  50 mg Oral Q breakfast   Continuous Infusions:  LOS: 4 days    Time spent: 40 minutes   Dessa Phi, DO [facility] [email or web address] 01/15/2019, 12:49 PM

## 2019-01-16 LAB — BASIC METABOLIC PANEL
Anion gap: 11 (ref 5–15)
BUN: 23 mg/dL (ref 8–23)
CO2: 33 mmol/L — ABNORMAL HIGH (ref 22–32)
Calcium: 8.9 mg/dL (ref 8.9–10.3)
Chloride: 92 mmol/L — ABNORMAL LOW (ref 98–111)
Creatinine, Ser: 0.75 mg/dL (ref 0.44–1.00)
GFR calc Af Amer: >60 mL/min (ref >60)
GFR calc non Af Amer: >60 mL/min (ref >60)
Glucose, Bld: 226 mg/dL — ABNORMAL HIGH (ref 70–99)
POTASSIUM: 3.7 mmol/L (ref 3.5–5.1)
Sodium: 136 mmol/L (ref 135–145)

## 2019-01-16 LAB — CBC
HCT: 44.7 % (ref 36.0–46.0)
Hemoglobin: 12.9 g/dL (ref 12.0–15.0)
MCH: 23.9 pg — ABNORMAL LOW (ref 26.0–34.0)
MCHC: 28.9 g/dL — ABNORMAL LOW (ref 30.0–36.0)
MCV: 82.9 fL (ref 80.0–100.0)
Platelets: 179 10*3/uL (ref 150–400)
RBC: 5.39 MIL/uL — AB (ref 3.87–5.11)
RDW: 16.2 % — ABNORMAL HIGH (ref 11.5–15.5)
WBC: 18.8 10*3/uL — ABNORMAL HIGH (ref 4.0–10.5)
nRBC: 0 % (ref 0.0–0.2)

## 2019-01-16 LAB — GLUCOSE, CAPILLARY
Glucose-Capillary: 181 mg/dL — ABNORMAL HIGH (ref 70–99)
Glucose-Capillary: 241 mg/dL — ABNORMAL HIGH (ref 70–99)

## 2019-01-16 MED ORDER — INSULIN NPH (HUMAN) (ISOPHANE) 100 UNIT/ML ~~LOC~~ SUSP: SUBCUTANEOUS | 0 refills | Status: DC

## 2019-01-16 MED ORDER — GLUCOSE 4 G PO CHEW: 1.0000 | CHEWABLE_TABLET | ORAL | 0 refills | Status: AC | PRN

## 2019-01-16 MED ORDER — INSULIN ASPART 100 UNIT/ML ~~LOC~~ SOLN: SUBCUTANEOUS | 0 refills | Status: AC

## 2019-01-16 MED ORDER — PREDNISONE 10 MG PO TABS: ORAL_TABLET | ORAL | 0 refills | Status: DC

## 2019-01-16 NOTE — Care Management (Signed)
Home oxygen ordered through [facility]. Patient concerned about transporting oxygen and states she needs purse type concentrator. [facility] will eval her this week for one, pt updated. She requested Rollator, ordered and it will be delivered to room prior to DC. Oxygen for transport home will be delivered to room prior to DC. No other CM needs

## 2019-01-16 NOTE — Discharge Summary (Signed)
Physician Discharge Summary  Bonnie Gonzalez TGG:[id number] DOB: 12/15/1955 DOA: 01/10/2019  PCP: Doreatha Lew, MD  Admit date: 01/10/2019 Discharge date: 01/16/2019  Admitted From: Home Disposition: Home  Recommendations for Outpatient Follow-up:  1. Follow up with PCP on 2/28  2. Follow up with Endocrinology on 2/21 3. Referral to pulmonology recommended as outpatient  4. Outpatient sleep study recommended 5. Outpatient nutrition and diabetic coordinator referral placed 6. Outpatient psychiatry recommended, social work provided resources prior to discharge  7. Outpatient PT referral, prescription provided at discharge 8. Please obtain CBC once prednisone course completed   Home Health: None Equipment/Devices: Home O2   Discharge Condition: Stable CODE STATUS: Full  Diet recommendation: Carb modified   Brief/Interim Summary: Bonnie Gonzalez a62 y.o.femalewith a history of COPD, likely OSA, morbid obesity (BMI 53), poorly-controlled IDT2DM, and chronic HFpEF who presented to the ED with shortness of breath, wheezing, and cough with pleuritic chest pain. Work up in the ED included negative flu panel, troponin, CXR, ECG, and normal BNP. She was hypoxic and admitted for presumed COPD exacerbation. She was treated with steroids and supplemental oxygen. Hospitalization complicated by uncontrolled diabetes, depression. Psychiatry and diabetic coordinator assisted with management.   Discharge Diagnoses:  Acute hypoxic respiratory failure -Due to COPD exacerbation on baseline with postinflammatory pulmonary fibrosis, morbid obesity, suspect OHS, deconditioning, very strong suspicion for OSA, though undiagnosed, and chronic HFpEF. D-dimer borderline so CTA chest performed, shows no significant abnormality, including no PE.  - Continue supplemental oxygen as needed to maintain SpO2 >88%. Will need home O2 on discharge, ordered.   COPD exacerbation, postinflammatory pulmonary  fibrosis - Wean to PO steroids taper   IDDM type 2, poorly-controlled with hyperglycemia and steroid-induced worsening of hyperglycemia: HbA1c 10% -Appreciate diabetic coordinator. Ordered outpatient referral for nutrition and diabetic ed. Freestyle Libre sensor applied  - NPH 50u BID and Novolog sliding scale - Has outpatient endocrinology appointment scheduled   Morbid obesity - Weight loss counseling provided   Suspected OSA - CPAP qHS while in hospital   - Sleep study as outpatient  Chronic diastolic HF  - EF 7/03 with stable presreved EF and G1DD - Continuelasix PO   Chronic pain, DJD - Continue home naproxen  Hypothyroidism - Continue synthroid  Hyperlipidemia - Continue Lipitor  Depression/anxiety - Restart home vistaril, continue xanax, cymbalta - Appreciate Psych. Consulted SW to provide patient with outpatient mental health resources.   Mild cognitive impairment - Continue aricept  GERD:  - Continue PPI  Discharge Instructions  Discharge Instructions    Ambulatory referral to Nutrition and Diabetic Education   Complete by:  As directed    Ambulatory referral to Nutrition and Diabetic Education   Complete by:  As directed    Call MD for:  difficulty breathing, headache or visual disturbances   Complete by:  As directed    Call MD for:  extreme fatigue   Complete by:  As directed    Call MD for:  hives   Complete by:  As directed    Call MD for:  persistant dizziness or light-headedness   Complete by:  As directed    Call MD for:  persistant nausea and vomiting   Complete by:  As directed    Call MD for:  severe uncontrolled pain   Complete by:  As directed    Call MD for:  temperature >100.4   Complete by:  As directed    Diet Carb Modified   Complete by:  As directed    Discharge instructions   Complete by:  As directed    You were cared for by a hospitalist during your hospital stay. If you have any questions about your discharge  medications or the care you received while you were in the hospital after you are discharged, you can call the unit and ask to speak with the hospitalist on call if the hospitalist that took care of you is not available. Once you are discharged, your primary care physician will handle any further medical issues. Please note that NO REFILLS for any discharge medications will be authorized once you are discharged, as it is imperative that you return to your primary care physician (or establish a relationship with a primary care physician if you do not have one) for your aftercare needs so that they can reassess your need for medications and monitor your lab values.   Increase activity slowly   Complete by:  As directed      Allergies as of 01/16/2019      Reactions   Aspirin Anaphylaxis   Mother had to be resuscitated after ingesting this   Codeine Hives   Fluoxetine Hcl Itching, Other (See Comments)   "Hair itching"   Hydrocodone-acetaminophen Hives   Oxycodone-acetaminophen Hives   Penicillins Hives   Did it involve swelling of the face/tongue/throat, SOB, or low BP? Yes-hives Did it involve sudden or severe rash/hives, skin peeling, or any reaction on the inside of your mouth or nose? Unk Did you need to seek medical attention at a hospital or doctor's office? Yes When did it last happen? Was in her 24's If all above answers are "NO", may proceed with cephalosporin use.      Medication List    STOP taking these medications   doxycycline 100 MG tablet Commonly known as:  VIBRA-TABS   ondansetron 4 MG tablet Commonly known as:  ZOFRAN   phentermine 37.5 MG capsule     TAKE these medications   albuterol 108 (90 Base) MCG/ACT inhaler Commonly known as:  PROVENTIL HFA;VENTOLIN HFA Inhale 2 puffs into the lungs every 4 (four) hours as needed for wheezing or shortness of breath.   ALPRAZolam 0.5 MG tablet Commonly known as:  XANAX TAKE 1 TABLET (0.5 MG TOTAL) BY MOUTH 2 (TWO) TIMES  DAILY AS NEEDED. What changed:    how much to take  when to take this   atorvastatin 40 MG tablet Commonly known as:  LIPITOR Take 1 tablet (40 mg total) by mouth daily. What changed:  when to take this   benzonatate 200 MG capsule Commonly known as:  TESSALON Take 1 capsule (200 mg total) by mouth 2 (two) times daily as needed for cough.   CLEAR EYES COMPLETE Soln Place 1-2 drops into both eyes as needed (for itching).   donepezil 10 MG tablet Commonly known as:  ARICEPT TAKE 1 TABLET BY MOUTH AT BEDTIME   DULoxetine 60 MG capsule Commonly known as:  CYMBALTA Take 1 capsule (60 mg total) by mouth daily. What changed:  when to take this   esomeprazole 40 MG capsule Commonly known as:  NEXIUM Take 30- 60 min before your first and last meals of the day What changed:    how much to take  how to take this  when to take this  additional instructions   furosemide 40 MG tablet Commonly known as:  LASIX TAKE 1 TABLET BY MOUTH ONCE DAILY   glucose 4 GM chewable tablet Chew  1 tablet (4 g total) by mouth as needed for low blood sugar (< 70).   halobetasol 0.05 % cream Commonly known as:  ULTRAVATE Apply topically 2 (two) times daily as needed. What changed:    how much to take  reasons to take this   hydrOXYzine 25 MG capsule Commonly known as:  VISTARIL TAKE 1 CAPSULE BY MOUTH THREE TIMES A DAY AS NEEDED What changed:  See the new instructions.   insulin aspart 100 UNIT/ML injection Commonly known as:  NOVOLOG Use sliding scale provided at time of discharge, administer 3 times daily with meals   insulin NPH Human 100 UNIT/ML injection Commonly known as:  NOVOLIN N 50 units every morning, 50 units every evening What changed:  additional instructions   levothyroxine 175 MCG tablet Commonly known as:  SYNTHROID, LEVOTHROID TAKE 1 TABLET BY MOUTH ONCE DAILY BEFORE BREAKFAST What changed:    how much to take  how to take this  when to take  this  additional instructions   meclizine 50 MG tablet Commonly known as:  ANTIVERT Take 0.5 tablets (25 mg total) by mouth 3 (three) times daily as needed. What changed:  reasons to take this   methocarbamol 750 MG tablet Commonly known as:  ROBAXIN Take 1 tablet (750 mg total) by mouth every 6 (six) hours as needed for muscle spasms.   naproxen 500 MG tablet Commonly known as:  NAPROSYN Take 500 mg by mouth 2 (two) times daily.   predniSONE 10 MG tablet Commonly known as:  DELTASONE Take 4 tabs for 3 days, then 3 tabs for 3 days, then 2 tabs for 3 days, then 1 tab for 3 days, then 1/2 tab for 4 days.   temazepam 30 MG capsule Commonly known as:  RESTORIL TAKE ONE CAPSULE BY MOUTH EVERY NIGHT AT BEDTIME AS NEEDED What changed:  See the new instructions.   umeclidinium-vilanterol 62.5-25 MCG/INH Aepb Commonly known as:  ANORO ELLIPTA Inhale 1 puff into the lungs daily.            Durable Medical Equipment  (From admission, onward)         Start     Ordered   01/15/19 1200  For home use only DME oxygen  Once    Question Answer Comment  Mode or (Route) Nasal cannula   Liters per Minute 2   Frequency Continuous (stationary and portable oxygen unit needed)   Oxygen delivery system Gas      01/15/19 1159         Follow-up Information    [facility] up.   Specialty:  Rehabilitation Why:  they will contact you for apt,if you do not hear from them please call them Contact information: [address] [place]       Doreatha Lew, MD. Go on 01/28/2019.   Specialty:  Family Medicine Contact information: [address] [place] [address] [phone]          Allergies  Allergen Reactions  . Aspirin Anaphylaxis    Mother had to be resuscitated after ingesting this  . Codeine Hives  . Fluoxetine Hcl Itching and Other (See  Comments)    "Hair itching"  . Hydrocodone-Acetaminophen Hives  . Oxycodone-Acetaminophen Hives  . Penicillins Hives    Did it involve swelling of the face/tongue/throat, SOB, or low BP? Yes-hives Did it involve sudden or severe rash/hives, skin peeling, or any reaction  on the inside of your mouth or nose? Unk Did you need to seek medical attention at a hospital or doctor's office? Yes When did it last happen? Was in her 61's If all above answers are "NO", may proceed with cephalosporin use.     Consultations:  Psychiatry    Procedures/Studies: Dg Chest 2 View  Result Date: 01/10/2019 CLINICAL DATA:  Productive cough, fever and sore throat for 3 days. EXAM: CHEST - 2 VIEW COMPARISON:  Chest CT 09/14/2018 FINDINGS: Mildly enlarged cardiac silhouette. Calcific atherosclerotic disease of the aorta. Mediastinal contours appear intact. There is no evidence of focal airspace consolidation, pleural effusion or pneumothorax. Osseous structures are without acute abnormality. Soft tissues are grossly normal. IMPRESSION: No active pulmonary disease. Mildly enlarged cardiac silhouette. Calcific atherosclerotic disease of the aorta. Electronically Signed   By: Fidela Salisbury M.D.   On: 01/10/2019 15:00   Ct Angio Chest Pe W Or Wo Contrast  Result Date: 01/11/2019 CLINICAL DATA:  Dyspnea. EXAM: CT ANGIOGRAPHY CHEST WITH CONTRAST TECHNIQUE: Multidetector CT imaging of the chest was performed using the standard protocol during bolus administration of intravenous contrast. Multiplanar CT image reconstructions and MIPs were obtained to evaluate the vascular anatomy. CONTRAST:  67mL ISOVUE-370 IOPAMIDOL (ISOVUE-370) INJECTION 76% COMPARISON:  Chest CT 09/14/2018 FINDINGS: Cardiovascular: The heart size is normal. No substantial pericardial effusion. Atherosclerotic calcification is noted in the wall of the thoracic aorta. No filling defect in the opacified pulmonary arteries to suggest the presence of an  acute pulmonary embolus. Mediastinum/Nodes: No mediastinal lymphadenopathy. There is no hilar lymphadenopathy. The esophagus has normal imaging features. There is no axillary lymphadenopathy. Lungs/Pleura: The central tracheobronchial airways are patent. There is some minimal subpleural reticulation in the lungs bilaterally with a lower lung predominance. No focal airspace consolidation. No pleural effusion. Upper Abdomen: Irregular liver contour is highly suggestive of cirrhosis. Musculoskeletal: No worrisome lytic or sclerotic osseous abnormality. Stable mild compression deformity at T12. Review of the MIP images confirms the above findings. IMPRESSION: 1. No CT evidence for acute pulmonary embolus. 2. No pulmonary edema, pleural effusion, or focal lung consolidation. 3. Cirrhosis 4.  Aortic Atherosclerois (ICD10-170.0) Electronically Signed   By: Misty Stanley M.D.   On: 01/11/2019 18:35      Discharge Exam: Vitals:   01/16/19 0749 01/16/19 0802  BP: 139/71   Pulse: (!) 56   Resp: 14   Temp: 97.8 F (36.6 C)   SpO2: 97% 97%    General: Pt is alert, awake, not in acute distress Cardiovascular: RRR, S1/S2 +, no rubs, no gallops Respiratory: CTA bilaterally, no wheezing, no rhonchi Abdominal: Soft, NT, ND, bowel sounds + Extremities: no edema, no cyanosis    The results of significant diagnostics from this hospitalization (including imaging, microbiology, ancillary and laboratory) are listed below for reference.     Microbiology: No results found for this or any previous visit (from the past 240 hour(s)).   Labs: BNP (last 3 results) Recent Labs    01/10/19 1613  BNP 15.1   Basic Metabolic Panel: Recent Labs  Lab 01/11/19 0221 01/12/19 0304 01/14/19 0326 01/15/19 0324 01/16/19 0249  NA 135 133* 136 139 136  K 4.9 5.1 4.2 3.5 3.7  CL 93* 92* 92* 90* 92*  CO2 28 31 34* 38* 33*  GLUCOSE 410* 353* 236* 140* 226*  BUN 13 18 28* 24* 23  CREATININE 0.79 0.77 0.82 0.82 0.75   CALCIUM 9.4 9.3 9.2 9.0 8.9   Liver Function Tests: No results  for input(s): AST, ALT, ALKPHOS, BILITOT, PROT, ALBUMIN in the last 168 hours. No results for input(s): LIPASE, AMYLASE in the last 168 hours. No results for input(s): AMMONIA in the last 168 hours. CBC: Recent Labs  Lab 01/11/19 0221 01/12/19 0128 01/14/19 0750 01/15/19 0324 01/16/19 0249  WBC 10.7* 16.9* 15.0* 17.9* 18.8*  HGB 12.5 12.0 12.2 13.3 12.9  HCT 43.6 40.3 42.6 44.8 44.7  MCV 84.2 83.3 84.0 84.1 82.9  PLT 173 180 165 183 179   Cardiac Enzymes: Recent Labs  Lab 01/10/19 1721 01/11/19 1415 01/11/19 1945 01/12/19 0128  TROPONINI <0.03 <0.03 <0.03 <0.03   BNP: Invalid input(s): POCBNP CBG: Recent Labs  Lab 01/15/19 0748 01/15/19 1155 01/15/19 1717 01/15/19 2103 01/16/19 0747  GLUCAP 86 178* 187* 225* 181*   D-Dimer No results for input(s): DDIMER in the last 72 hours. Hgb A1c No results for input(s): HGBA1C in the last 72 hours. Lipid Profile No results for input(s): CHOL, HDL, LDLCALC, TRIG, CHOLHDL, LDLDIRECT in the last 72 hours. Thyroid function studies No results for input(s): TSH, T4TOTAL, T3FREE, THYROIDAB in the last 72 hours.  Invalid input(s): FREET3 Anemia work up No results for input(s): VITAMINB12, FOLATE, FERRITIN, TIBC, IRON, RETICCTPCT in the last 72 hours. Urinalysis    Component Value Date/Time   COLORURINE YELLOW 06/29/2017 0529   APPEARANCEUR HAZY (A) 06/29/2017 0529   LABSPEC 1.010 06/29/2017 0529   PHURINE 7.0 06/29/2017 0529   GLUCOSEU NEGATIVE 06/29/2017 0529   GLUCOSEU NEGATIVE 10/18/2015 1126   HGBUR NEGATIVE 06/29/2017 0529   HGBUR negative 09/18/2010 0850   BILIRUBINUR NEGATIVE 06/29/2017 0529   BILIRUBINUR neg 06/05/2017 1157   KETONESUR NEGATIVE 06/29/2017 0529   PROTEINUR NEGATIVE 06/29/2017 0529   UROBILINOGEN 0.2 06/05/2017 1157   UROBILINOGEN 0.2 10/18/2015 1126   NITRITE NEGATIVE 06/29/2017 0529   LEUKOCYTESUR LARGE (A) 06/29/2017 0529    Sepsis Labs Invalid input(s): PROCALCITONIN,  WBC,  LACTICIDVEN Microbiology No results found for this or any previous visit (from the past 240 hour(s)).   Patient was seen and examined on the day of discharge and was found to be in stable condition. Time coordinating discharge: 55 minutes including assessment and coordination of care, as well as examination of the patient. Extended time spent with patient and education as well as discussion of patient's care with family over the phone.   SIGNED:  Dessa Phi, DO [facility] [email or web address] 01/16/2019, 9:27 AM

## 2019-01-16 NOTE — Progress Notes (Signed)
Pt states that she dose not have any oxygen at home. Case management called, left message. MD sent notification.

## 2019-01-16 NOTE — Progress Notes (Signed)
Inpatient Diabetes Program Recommendations  AACE/ADA: New Consensus Statement on Inpatient Glycemic Control (2015)  Target Ranges:  Prepandial:   less than 140 mg/dL      Peak postprandial:   less than 180 mg/dL (1-2 hours)      Critically ill patients:  140 - 180 mg/dL   Lab Results  Component Value Date   GLUCAP 181 (H) 01/16/2019   HGBA1C 10.0 (H) 01/11/2019    Review of Glycemic Control Results for RAEGYN, RENDA (MRN [id number]) as of 01/16/2019 08:30  Ref. Range 01/15/2019 07:48 01/15/2019 11:55 01/15/2019 17:17 01/15/2019 21:03 01/16/2019 07:47  Glucose-Capillary Latest Ref Range: 70 - 99 mg/dL 86 178 (H) 187 (H) 225 (H) 181 (H)   Inpatient Diabetes Program Recommendations:   Discussed with Dr. Maylene Roes yesterday and adjustments made in insulin. Fasting good this am @ 181. Agree with current doses if steroids to be left @ current dose. Also spoke with patient and her daughter yesterday explaining timing of insulin including NPH and meal coverage. Plan to call patient and daughter today to answer further questions on timing.  Thank you, Nani Gasser. Donie Lemelin, RN, MSN, CDE  Diabetes Coordinator Inpatient Glycemic Control Team Team Pager [phone] (8am-5pm) 01/16/2019 8:32 AM

## 2019-01-16 NOTE — Plan of Care (Signed)

## 2019-01-16 NOTE — Discharge Instructions (Signed)
·   Check your blood sugar 5 times daily: when you wake up, before breakfast, before lunch, before dinner, before bedtime. You may also check throughout the day if you feel that your blood sugar is too low or too high.   Novolin NPH is your slow-acting insulin. This should be used at the same time each day. Start with 50 units twice daily.   Novolog is your short-acting correction insulin. Check your blood sugar prior to your meal, three times daily. Then depending on your blood sugar, inject insulin as follows:  Blood sugar 70 - 120: 0 units   Blood sugar 121 - 150: 3 units   Blood sugar 151 - 200: 4 units   Blood sugar 201 - 250: 7 units   Blood sugar 251 - 300: 11 units   Blood sugar 301 - 350: 15 units   Blood sugar 351 - 400: 20 units     Also check your blood sugar prior to bedtime. Then depending on your blood sugar, inject insulin as follows: Blood sugar 70 - 120: 0 units  Blood sugar 121 - 150: 0 units  Blood sugar 151 - 200: 0 units  Blood sugar 201 - 250: 2 units  Blood sugar 251 - 300: 3 units  Blood sugar 301 - 350: 4 units  Blood sugar 351 - 400: 5 units   Keep a journal of your blood sugar results each day. Bring to your primary care physician.   Take glucose tablets as needed if hypoglycemic, blood sugar < 70.

## 2019-01-16 NOTE — Progress Notes (Signed)
Ambulatory O2 drop 74% on RA. Pt c/o of some dizziness. Assisted back to room. MD notification.

## 2019-01-17 ENCOUNTER — Telehealth: Payer: Self-pay | Admitting: Internal Medicine

## 2019-01-17 ENCOUNTER — Other Ambulatory Visit: Payer: Self-pay

## 2019-01-17 DIAGNOSIS — J449 Chronic obstructive pulmonary disease, unspecified: Secondary | ICD-10-CM

## 2019-01-17 DIAGNOSIS — G4733 Obstructive sleep apnea (adult) (pediatric): Secondary | ICD-10-CM

## 2019-01-17 DIAGNOSIS — E1169 Type 2 diabetes mellitus with other specified complication: Secondary | ICD-10-CM

## 2019-01-17 NOTE — Telephone Encounter (Signed)
Pt and daughter called, stating that pt's insurance recently changed to where our office is out of network.  Pt was recently hospitalized and was told in hospitalization that she has sleep apnea and needs a cpap machine.  Pt is requesting an urgent referral to a pulmonologist in network with her insurance.  MW please advise if you are ok with referring patient urgently to a new pulmonologist.  Pt was on cpap in hospital and sleeping well, and is now struggling at home.  Thanks!

## 2019-01-17 NOTE — Telephone Encounter (Signed)
PCP should be referring to the pulmonologist/sleep doctor of his choice but ok with me if PCP can't ok  this for her

## 2019-01-17 NOTE — Telephone Encounter (Signed)
LMTCB

## 2019-01-18 ENCOUNTER — Other Ambulatory Visit: Payer: Self-pay | Admitting: *Deleted

## 2019-01-18 NOTE — Telephone Encounter (Signed)
Order placed.   Routing to PCCs to make aware.

## 2019-01-18 NOTE — Patient Outreach (Signed)
[facility]) Care Management  01/18/2019  Bonnie Gonzalez [birth year] [id number]   Referral received from hospital liaison as member was recently admitted to hospital for COPD exacerbation.  Primary MD office will complete transition of care assessment.  Per chart, she also has history of hypothyroidism, GERD, diabetes (A1C 10.1), and hyperlipidemia.  Call placed to member for telephone assessment, no answer.  HIPAA compliant voice message left.  Unsuccessful outreach letter sent, will follow up within the next 4 business days.  Bonnie Gonzalez, [place], MSN [place] [phone]

## 2019-01-18 NOTE — Telephone Encounter (Signed)
Fine with me

## 2019-01-18 NOTE — Telephone Encounter (Signed)
Pt returned call. Informed her of the recs per MW. Pt states her PCP isnt able to refer is wants MW to do the URGENT referral to [place] at [facility] in [place], phone: [phone].  Triage, please place order, no need to call pt back.   PCC's please call pt on [phone] or [place], pts daughter.

## 2019-01-19 ENCOUNTER — Telehealth: Payer: Self-pay | Admitting: Internal Medicine

## 2019-01-19 NOTE — Telephone Encounter (Signed)
Patient states [facility]ologist needs records for referral.  Phone number is [phone].  Fax number is [phone].  Patient would like to discuss the referral.  Patient phone number is [phone].  [name] daughter phone number is [phone].

## 2019-01-19 NOTE — Telephone Encounter (Signed)
PCCs, please advise if the referral has been sent to [facility]. Thanks!

## 2019-01-20 ENCOUNTER — Other Ambulatory Visit: Payer: Self-pay | Admitting: *Deleted

## 2019-01-20 NOTE — Telephone Encounter (Signed)
Got pt scheduled w/ [name], PA w/ [facility] for 3/6 @ 2:45, check in by 2:00.  Pt needs to bring ins, photo id, med list, co pay &/or deductible/co ins.  LM on VM to give pt appt info.

## 2019-01-20 NOTE — Telephone Encounter (Signed)
This was sent to [facility] on the 18th.  Per pt, [facility] states they have not received the referral.  I let her know I will re-send & follow up with a phone call.

## 2019-01-20 NOTE — Telephone Encounter (Signed)
I was able to reach the  Patient.  Pt is not happy with the appt da/te/time & asked me to call back & request a sooner appt.  I reached out to [facility] again & they said that is the soonest appt, but the patient can call in & check for cancellations. Notified patient who verbalized understanding.  Nothing further needed at this time.

## 2019-01-20 NOTE — Telephone Encounter (Signed)
Spoke to [facility] who states it doesn't look like they have received the referral the other 2 times it was faxed & asked me to refax this.  I did & included the fax confirmations.

## 2019-01-20 NOTE — Patient Outreach (Signed)
[place] [facility]) Care Management  01/20/2019  Bonnie Gonzalez Nov 01, 1956 [id number]   Call received from [name] daughter, Bonnie Gonzalez [phone]) after initial outreach to member.  Member present with daughter, identity verified, permission granted to discuss medical concerns with daughter.  This care manager introduced self and stated purpose of outreach.  [facility] care management services explained, verbal consent received, written consent obtained form hospital liaison.  Bonnie Gonzalez report member was discharged to her home ([address] [place], [place]) as she did not feel member was able to care for herself.  She lives in [place] with her other daughter, but Bonnie Gonzalez is a Marine scientist and feel member could improve better in her home with increased medical insight.  Member was discharged home on oxygen, which is new for her, and a new insulin.  She has adjusted well to the oxygen, but is concerned with the financial burden of the insulin.  She was taking Novolin, which was about $50/month, but now is having to use Novolog, which reportedly cost $136.  Also, per medication list, she should be taking Anorro Ellipta but has not been taking.  Agree to referral to pharmacist.  Member was being seen at [facility] pulmonology office but daughter is trying to have her transferred to [facility] pulmonology.  She has been trying to get member a sleep study due to concern for need for CPAP (used in the hospital, made a huge difference per daughter).  She has followed up with [facility] office as recent as today, per note referral sent again.    A1C increased to 10, member now using [facility] to monitor blood sugars.  Fasting blood sugars since discharge range 120-178.  Post prandial blood sugars range 177-254.  Daughter has been managing medications and most of the meals.  Has appointment with endocrinologist 2/21.  Follow up appointment with primary MD next Friday, 2/28.  Per discharge instructions, prescription  was received for outpatient PT.  Daughter feel member would benefit more for home health PT instead of outpatient as she is unable to provide transportation several days a week.  She is also concerned with having member increase her social interaction in the community.  She is open to referral to Education officer, museum for additional community resources.  Daughter denies any urgent concerns at this time.  Aware of call to primary MD office to request home health order instead of outpatient PT.    Will place referral to social worker. Will place referral to [facility] pharmacist. Will follow up with member/daughter within the next 2 weeks.  THN CM Care Plan Problem One     Most Recent Value  Care Plan Problem One  Knowledge deficit regarding management of chronic medical conditions as evidenced by recent hospitalization  Role Documenting the Problem One  Care Management [place] for Problem One  Active  THN Long Term Goal   Member's A1C will be decreased to </= 8 within the next 3 months  THN Long Term Goal Start Date  01/20/19  Interventions for Problem One Long Term Goal  Daughter educated on complications of uncontrolled diabetes and uncontrolled COPD.  Call placed to MD office to request home health PT  [facility] CM Short Term Goal #1   Member will report taking medications as prescribed, especially inhalers, over the next 4 weeks  THN CM Short Term Goal #1 Start Date  01/20/19  Interventions for Short Term Goal #1  Medications reviewed with daughter.  Referral placed to [facility] pharmacist  THN CM Short Term Goal #2   Member will keep and attend follow up appointments with endocrinologist and primary MD within the next 2 weeks  THN CM Short Term Goal #2 Start Date  01/20/19  Interventions for Short Term Goal #2  Upcoming appointments reviewed with member's daughter.  Transportation needs assessed, referral placed to Education officer, museum for community resources     [name], Therapist, sports, MSN [place] [phone]

## 2019-01-21 ENCOUNTER — Ambulatory Visit: Payer: Self-pay | Admitting: *Deleted

## 2019-01-25 ENCOUNTER — Other Ambulatory Visit: Payer: Self-pay | Admitting: *Deleted

## 2019-01-25 NOTE — Patient Outreach (Addendum)
[facility]) Care Management  01/25/2019  Coutney I Quadros [birth year] [id number]   Phone call to patient and patient's daughter to discuss Medicaid eligibility and resources to assist with socialization. This Education officer, museum introduced self and purpose of call.  Per patient's daughter Mickel Baas, patient is residing with her in [place] until she can care for herself independently. Patient lives in [place] routinely.  Per patient's daughter, she would like patient to get adjusted to her new medication and home oxygen.  Patient's daughter would also like to see patient increase her socialization. Patient's socialization outside of the home is limited, with patient only going out with daughter or to church. Patient's daughter did admit that this is pretty much her baseline but feels that if she got out more, the activity would improve her mood. Patient was referred to the [facility] for mental health counseling, however patient's daughter has not gotten the chance to take her there yet due to her work schedule. Patient's daughter would like to see patient's independence improve overall.  This Education officer, museum discussed the possibility of increasing her socialization by participating in silver sneakers, [employer] activities and outpatient counseling to address her mood. [employer] discussed as a possibility as they do in home counseling. This Education officer, museum was also able to address questions related to applying for Medicaid. Patient does not meet the financial criteria for Medicaid and she does not meet the age range for Medicare. Patient, however is receiving FMLA at this time.   Plan:  Patient's daughter requested that information regarding community resources that patient may benefit from be emailed to her. Community resource information to be emailed to patient and patient's daughter. Patient and/or patient's daughter to contact this social worker if there are any  additional community resource needs.  email [email or web address]   [place], [place] Management [phone]

## 2019-01-26 ENCOUNTER — Ambulatory Visit: Payer: Self-pay | Admitting: *Deleted

## 2019-01-26 ENCOUNTER — Other Ambulatory Visit: Payer: Self-pay | Admitting: Pharmacist

## 2019-01-26 DIAGNOSIS — I10 Essential (primary) hypertension: Secondary | ICD-10-CM | POA: Insufficient documentation

## 2019-01-26 HISTORY — DX: Essential (primary) hypertension: I10

## 2019-01-28 ENCOUNTER — Ambulatory Visit: Payer: Self-pay | Admitting: Pharmacist

## 2019-01-28 DIAGNOSIS — I5032 Chronic diastolic (congestive) heart failure: Secondary | ICD-10-CM | POA: Insufficient documentation

## 2019-01-28 HISTORY — DX: Chronic diastolic (congestive) heart failure: I50.32

## 2019-01-31 ENCOUNTER — Other Ambulatory Visit: Payer: Self-pay | Admitting: *Deleted

## 2019-01-31 NOTE — Patient Outreach (Signed)
[place] [facility]) Care Management  01/31/2019  Talynn I Mellott November 14, 1956 [id number]   Call placed to member to follow up on contact with social worker and pharmacist as well as visit with PCP and start of home health PT.  No answer, HIPAA compliant voice message left.  Call then placed to daughter/caregiver, Harrison Mons.  No answer, HIPAA compliant voice message left.  Will follow up within the next 4 business days.  [name], [place], MSN [place] [phone]

## 2019-02-01 ENCOUNTER — Other Ambulatory Visit: Payer: Self-pay | Admitting: Pharmacy Technician

## 2019-02-01 NOTE — Patient Outreach (Signed)
[facility]) Care Management  [place]   01/26/2019  Bonnie Gonzalez Jan 20, 1956 [id number]  Reason for referral: Medication Assistance with Insulin and Inhalers  Referral source: [facility] RN Current insurance:[employer] commerical  PMHx includes but not limited to:  COPD, DMT2 (last A1c 10 on 01/11/19), GERD, hypothyroidism, HLD  Outreach:  Successful telephone call with Bonnie Gonzalez.  HIPAA identifiers verified.  Patient states she is still recovering from respiratory illness.  She states she is feeling better and finishing up her prednisone taper and antibiotic.  She denies SOB/wheezing.  Patient is agreeable to review medications & allergies telephonically.  She reports 100% compliance with medications.  She states she is seeing a new pulmonologist on 3/6 Bonnie Dolin, PA at [facility]).  She states she was told she doesn't have COPD, however it is documented in her chart and she was a former smoker (quit in 2010.  She states most of her wheezing/SOB is likely due to being overweight.  She states she is working to bring her A1c down (last 10.0 on 01/11/19) by adhering to medication and lifestyle.  Encouraged patient to continue her plan and work to bring A1c down to goal for better outcomes and improved quality of life.   Patient reports she paid over $200 for her medications that she picked up last week.  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  Copay cards are available for a few of her medications.   Objective: Lab Results  Component Value Date   CREATININE 0.75 01/16/2019   CREATININE 0.82 01/15/2019   CREATININE 0.82 01/14/2019    Lab Results  Component Value Date   HGBA1C 10.0 (H) 01/11/2019    Lipid Panel     Component Value Date/Time   CHOL 133 02/03/2017 0914   TRIG 147.0 02/03/2017 0914   HDL 28.40 (L) 02/03/2017 0914   CHOLHDL 5 02/03/2017 0914   VLDL 29.4 02/03/2017 0914   LDLCALC 75 02/03/2017  0914   LDLDIRECT 144.0 12/11/2011 0934    BP Readings from Last 3 Encounters:  01/16/19 139/71  01/10/19 (!) 173/67  11/10/18 130/64    Allergies  Allergen Reactions  . Aspirin Anaphylaxis    Mother had to be resuscitated after ingesting this  . Codeine Hives  . Fluoxetine Hcl Itching and Other (See Comments)    "Hair itching"  . Hydrocodone-Acetaminophen Hives  . Oxycodone-Acetaminophen Hives  . Penicillins Hives    Did it involve swelling of the face/tongue/throat, SOB, or low BP? Yes-hives Did it involve sudden or severe rash/hives, skin peeling, or any reaction on the inside of your mouth or nose? Unk Did you need to seek medical attention at a hospital or doctor's office? Yes When did it last happen? Was in her 34's If all above answers are "NO", may proceed with cephalosporin use.     Medications Reviewed Today    Reviewed by Bonnie Gonzalez, [facility] (Pharmacist) on 02/01/19 at 71  Med List Status: <None>  Medication Order Taking? Sig Documenting Provider Last Dose Status Informant  albuterol (PROVENTIL HFA;VENTOLIN HFA) 108 (90 Base) MCG/ACT inhaler [id number] Yes Inhale 2 puffs into the lungs every 4 (four) hours as needed for wheezing or shortness of breath. Bonnie Morale, MD Taking Active Self  ALPRAZolam Duanne Moron) 0.5 MG tablet [id number] Yes TAKE 1 TABLET (0.5 MG TOTAL) BY MOUTH 2 (TWO) TIMES DAILY AS NEEDED.  Patient taking differently:  Take 0.5-1 mg by mouth 2 (two)  times daily.    Bonnie Morale, MD Taking Active Self           Med Note Bonnie Gonzalez, Bonnie Gonzalez   Tue Feb 01, 2019  9:51 AM) Weaning off  atorvastatin (LIPITOR) 40 MG tablet [id number] Yes Take 1 tablet (40 mg total) by mouth daily.  Patient taking differently:  Take 80 mg by mouth at bedtime.    Bonnie Morale, MD Taking Active Self  benzonatate (TESSALON) 200 MG capsule [id number] Yes Take 1 capsule (200 mg total) by mouth 2 (two) times daily as needed for cough. Bonnie Morale, MD Taking Active Self  donepezil  (ARICEPT) 10 MG tablet [id number] Yes TAKE 1 TABLET BY MOUTH AT BEDTIME  Patient taking differently:  Take 10 mg by mouth at bedtime.    Bonnie Morale, MD Taking Active Self  DULoxetine (CYMBALTA) 60 MG capsule [id number] Yes Take 1 capsule (60 mg total) by mouth daily.  Patient taking differently:  Take 60 mg by mouth at bedtime.    Bonnie Morale, MD Taking Active Self  esomeprazole (NEXIUM) 40 MG capsule [id number] Yes Take 30- 60 min before your first and last meals of the day  Patient taking differently:  Take 40 mg by mouth daily before breakfast.    Bonnie Rockers, MD Taking Active Self  furosemide (LASIX) 40 MG tablet [id number] Yes TAKE 1 TABLET BY MOUTH ONCE DAILY  Patient taking differently:  Take 40 mg by mouth daily.    Bonnie Morale, MD Taking Active Self  glucose 4 GM chewable tablet [id number] Yes Chew 1 tablet (4 g total) by mouth as needed for low blood sugar (< 70). Dessa Phi, DO Taking Active   halobetasol (ULTRAVATE) 0.05 % cream [id number] Yes Apply topically 2 (two) times daily as needed.  Patient taking differently:  Apply 1 application topically 2 (two) times daily as needed (for itching).    Bonnie Morale, MD Taking Active Self  hydrOXYzine (VISTARIL) 25 MG capsule [id number] Yes TAKE 1 CAPSULE BY MOUTH THREE TIMES A DAY AS NEEDED  Patient taking differently:  Take 25 mg by mouth See admin instructions. Take 25 mg by mouth 2 times a day and an additional 25 mg as needed for itching   Bonnie Morale, MD Taking Active Self  Hyprom-Naphaz-Polysorb-Zn Sulf (CLEAR EYES COMPLETE) SOLN [id number] Yes Place 1-2 drops into both eyes as needed (for itching). [provider] Taking Active Self  insulin aspart (NOVOLOG) 100 UNIT/ML injection [id number] Yes Use sliding scale provided at time of discharge, administer 3 times daily with meals Dessa Phi, DO Taking Active   insulin NPH Human (NOVOLIN N) 100 UNIT/ML injection [id number] Yes 50 units every morning, 50 units  every evening Dessa Phi, DO Taking Active   levothyroxine (SYNTHROID, LEVOTHROID) 175 MCG tablet [id number] Yes TAKE 1 TABLET BY MOUTH ONCE DAILY BEFORE BREAKFAST  Patient taking differently:  Take 175 mcg by mouth daily before breakfast.    Bonnie Morale, MD Taking Active Self  meclizine (ANTIVERT) 50 MG tablet [id number] Yes Take 0.5 tablets (25 mg total) by mouth 3 (three) times daily as needed. Al Corpus, PA-C Taking Active Self           Med Note (Aryahi Denzler, Bonnie Gonzalez   Tue Feb 01, 2019  9:52 AM) Rarely uses   methocarbamol (ROBAXIN) 750 MG tablet [id number] Yes Take 1 tablet (750 mg total) by mouth every 6 (six) hours as needed for muscle spasms. [name]  A, MD Taking Active Self  naproxen (NAPROSYN) 500 MG tablet [id number] Yes Take 500 mg by mouth 2 (two) times daily.  [provider] Taking Active Self  predniSONE (DELTASONE) 10 MG tablet [id number] Yes Take 4 tabs for 3 days, then 3 tabs for 3 days, then 2 tabs for 3 days, then 1 tab for 3 days, then 1/2 tab for 4 days. Dessa Phi, DO Taking Active   umeclidinium-vilanterol [facility] ELLIPTA) 62.5-25 MCG/INH AEPB [id number]  Inhale 1 puff into the lungs daily.  Patient not taking:  Reported on 01/20/2019   Bonnie Rockers, MD  Active Self          Assessment:  Drugs sorted by system:  Neurologic/Psychologic: trazodone, alprazolam (weaning), duloxetine, donepezil  Cardiovascular: furosemide, atorvastatin  Pulmonary/Allergy: Anora Ellipta, albuterol, pred taper  Gastrointestinal: meclizine PRN (rare use), Nexium  Endocrine: levothyroxine, Novolog, Novolin N (NPH)  Topical: Ultravate  Pain: naproxen, methocarbamol   Medication Assistance Findings:  -Patient is not eligible for patient assistance programs for insulin or non-Merck inhalers through the manufacturers since she has Pharmacist, community Nurse, mental health).  She will qualify for the use of copay cards which we will send to her in the mail.  Encouraged patient  to ask for samples as needed  Patient Assistance Programs: 1)  Proventil made by [employer] o Income requirement met: '[x]'  Yes '[]'  No '[]'  Unknown o Out-of-pocket prescription expenditure met:    '[]'  Yes '[]'  No  '[]'  Unknown  '[x]'  Not applicable - Patient has met application requirements to apply for this patient assistance program.    Additional medication assistance options reviewed with patient as warranted:  Mail order programs through [place]: -I will mail patient manufacterur coupon cards for insulins and inhalers  -I will route patient assistance letter to [place] technician who will coordinate patient assistance program application process for medications listed above.  [facility] pharmacy technician will assist with obtaining all required documents from both patient and provider and submit application once completed.     [name], PharmD, [place]  [phone]

## 2019-02-02 NOTE — Patient Outreach (Signed)
[place] [facility]) Care Management  02/02/2019  Bonnie Gonzalez March 08, 1956 [id number]                          Medication Assistance Referral  Referral From: [facility] RPh Jenne Pane.  Medication/Company: Proventil HFA / [employer]ck Patient application portion:  Education officer, museum portion:  Mailed to A. Duane Boston, PA-C   Follow up:  Will follow up with patient in 5-7 business days to confirm application(s) have been received.  Maud Deed Chana Bode Marvin Certified Pharmacy Technician [place] Management Direct Dial:[phone]

## 2019-02-03 ENCOUNTER — Other Ambulatory Visit: Payer: Self-pay | Admitting: *Deleted

## 2019-02-03 ENCOUNTER — Encounter: Payer: Self-pay | Admitting: *Deleted

## 2019-02-03 NOTE — Patient Outreach (Signed)
[facility]) Care Management  02/03/2019  Bonnie Gonzalez [birth year] [id number]   Call received from member after missed call earlier this week.  Member state her phone does not always work in her daughter's home.  Report she is doing well, home health PT orders were received last week at MD office, but she is unsure of the agency that will be visiting.  State she had to go back to the MD on Monday as she experienced URI symptoms.  Received Z-pak, but she is doubtful that it will work.  Also state she was taken off Restoril and placed on Trazadone.  Report she will have her first appointment today with the family therapist for depression.  Will have appointment with new pulmonologist at [facility] on tomorrow.  Report blood sugars have been stable, range in the 100s.  Denies any urgent concerns at this time, will follow up within the next month.  THN CM Care Plan Problem One     Most Recent Value  Care Plan Problem One  Knowledge deficit regarding management of chronic medical conditions as evidenced by recent hospitalization  Role Documenting the Problem One  Care Management [place] for Problem One  Active  THN Long Term Goal   Member's A1C will be decreased to </= 8 within the next 3 months  THN Long Term Goal Start Date  01/20/19  Interventions for Problem One Long Term Goal  Member educated on importance of management of chronic health conditions, advised of complications if conditions aren't properly managed  THN CM Short Term Goal #1   Member will report taking medications as prescribed, especially inhalers, over the next 4 weeks  [facility]N CM Short Term Goal #1 Start Date  01/20/19  Interventions for Short Term Goal #1  Will collaborate with [facility] pharmacist for medication assistance and adherence  THN CM Short Term Goal #2   Member will keep and attend follow up appointments with endocrinologist and primary MD within the next 2 weeks  THN CM Short Term Goal #2  Start Date  01/20/19  [facility] CM Short Term Goal #2 Met Date  02/03/19     [name], RN, MSN [place] [facility] Manager [phone]

## 2019-02-04 ENCOUNTER — Ambulatory Visit: Payer: Self-pay | Admitting: *Deleted

## 2019-02-04 DIAGNOSIS — J9611 Chronic respiratory failure with hypoxia: Secondary | ICD-10-CM

## 2019-02-04 DIAGNOSIS — J432 Centrilobular emphysema: Secondary | ICD-10-CM

## 2019-02-04 HISTORY — DX: Chronic respiratory failure with hypoxia: J96.11

## 2019-02-04 HISTORY — DX: Centrilobular emphysema: J43.2

## 2019-02-07 ENCOUNTER — Ambulatory Visit: Payer: Self-pay | Admitting: Pharmacist

## 2019-02-07 ENCOUNTER — Ambulatory Visit: Payer: Self-pay | Admitting: *Deleted

## 2019-02-09 ENCOUNTER — Ambulatory Visit: Payer: Self-pay | Admitting: Pharmacist

## 2019-02-10 ENCOUNTER — Other Ambulatory Visit: Payer: Self-pay

## 2019-02-10 ENCOUNTER — Other Ambulatory Visit: Payer: Self-pay | Admitting: Pharmacist

## 2019-02-10 DIAGNOSIS — E119 Type 2 diabetes mellitus without complications: Secondary | ICD-10-CM | POA: Diagnosis not present

## 2019-02-10 DIAGNOSIS — J449 Chronic obstructive pulmonary disease, unspecified: Secondary | ICD-10-CM | POA: Insufficient documentation

## 2019-02-10 DIAGNOSIS — F329 Major depressive disorder, single episode, unspecified: Secondary | ICD-10-CM | POA: Diagnosis not present

## 2019-02-10 DIAGNOSIS — E039 Hypothyroidism, unspecified: Secondary | ICD-10-CM | POA: Diagnosis not present

## 2019-02-10 DIAGNOSIS — Z79899 Other long term (current) drug therapy: Secondary | ICD-10-CM | POA: Diagnosis not present

## 2019-02-10 NOTE — Patient Outreach (Signed)
[facility]) Care Management  [place]  02/10/2019  Bonnie Gonzalez [birth year] [id number]   Reason for call: medication assistance  Unsuccessful telephone call attempt #1 to patient.   HIPAA compliant voicemail left requesting a return call  Plan:  I will make another outreach attempt to patient within 3-4 business days   [name], PharmD, [place]  [phone]

## 2019-02-11 ENCOUNTER — Emergency Department (HOSPITAL_COMMUNITY)
Admission: EM | Admit: 2019-02-11 | Discharge: 2019-02-11 | Disposition: A | Discharge status: Home / Self Care | Payer: BLUE CROSS/BLUE SHIELD | Attending: Emergency Medicine | Admitting: Emergency Medicine

## 2019-02-11 DIAGNOSIS — F329 Major depressive disorder, single episode, unspecified: Secondary | ICD-10-CM

## 2019-02-11 LAB — RAPID URINE DRUG SCREEN, HOSP PERFORMED
Amphetamines: NOT DETECTED
Barbiturates: NOT DETECTED
Benzodiazepines: NOT DETECTED
Cocaine: NOT DETECTED
OPIATES: NOT DETECTED
Tetrahydrocannabinol: NOT DETECTED

## 2019-02-11 LAB — COMPREHENSIVE METABOLIC PANEL
ALT: 36 U/L (ref 0–44)
ANION GAP: 12 (ref 5–15)
AST: 29 U/L (ref 15–41)
Albumin: 4.1 g/dL (ref 3.5–5.0)
Alkaline Phosphatase: 85 U/L (ref 38–126)
BUN: 25 mg/dL — ABNORMAL HIGH (ref 8–23)
CHLORIDE: 98 mmol/L (ref 98–111)
CO2: 27 mmol/L (ref 22–32)
Calcium: 9.9 mg/dL (ref 8.9–10.3)
Creatinine, Ser: 0.83 mg/dL (ref 0.44–1.00)
GFR calc Af Amer: >60 mL/min (ref >60)
GFR calc non Af Amer: >60 mL/min (ref >60)
Glucose, Bld: 205 mg/dL — ABNORMAL HIGH (ref 70–99)
Potassium: 3.9 mmol/L (ref 3.5–5.1)
Sodium: 137 mmol/L (ref 135–145)
Total Bilirubin: 0.5 mg/dL (ref 0.3–1.2)
Total Protein: 7 g/dL (ref 6.5–8.1)

## 2019-02-11 LAB — CBG MONITORING, ED
Glucose-Capillary: 202 mg/dL — ABNORMAL HIGH (ref 70–99)
Glucose-Capillary: 191 mg/dL — ABNORMAL HIGH (ref 70–99)
Glucose-Capillary: 218 mg/dL — ABNORMAL HIGH (ref 70–99)

## 2019-02-11 LAB — ETHANOL: Alcohol, Ethyl (B): 17 mg/dL — ABNORMAL HIGH (ref <10)

## 2019-02-11 LAB — ACETAMINOPHEN LEVEL: Acetaminophen (Tylenol), Serum: <10 ug/mL — ABNORMAL LOW (ref 10–30)

## 2019-02-11 LAB — CBC
HCT: 44.2 % (ref 36.0–46.0)
Hemoglobin: 12.8 g/dL (ref 12.0–15.0)
MCH: 24.3 pg — ABNORMAL LOW (ref 26.0–34.0)
MCHC: 29 g/dL — ABNORMAL LOW (ref 30.0–36.0)
MCV: 84 fL (ref 80.0–100.0)
NRBC: 0 % (ref 0.0–0.2)
Platelets: 225 10*3/uL (ref 150–400)
RBC: 5.26 MIL/uL — ABNORMAL HIGH (ref 3.87–5.11)
RDW: 17.7 % — ABNORMAL HIGH (ref 11.5–15.5)
WBC: 9.1 10*3/uL (ref 4.0–10.5)

## 2019-02-11 LAB — SALICYLATE LEVEL: Salicylate Lvl: <7 mg/dL (ref 2.8–30.0)

## 2019-02-11 MED ORDER — FUROSEMIDE 20 MG PO TABS
40.0000 mg | ORAL_TABLET | Freq: Every day | ORAL | Status: DC
  Administered 2019-02-11: 40 mg via ORAL
  Filled 2019-02-11: qty 2

## 2019-02-11 MED ORDER — TRAZODONE HCL 50 MG PO TABS: 25.0000 mg | ORAL_TABLET | Freq: Every day | ORAL | Status: DC

## 2019-02-11 MED ORDER — PANTOPRAZOLE SODIUM 40 MG PO TBEC
40.0000 mg | DELAYED_RELEASE_TABLET | Freq: Every day | ORAL | Status: DC
  Administered 2019-02-11: 40 mg via ORAL
  Filled 2019-02-11: qty 1

## 2019-02-11 MED ORDER — DULOXETINE HCL 60 MG PO CPEP: 60.0000 mg | ORAL_CAPSULE | Freq: Every day | ORAL | Status: DC

## 2019-02-11 MED ORDER — ATORVASTATIN CALCIUM 80 MG PO TABS: 80.0000 mg | ORAL_TABLET | Freq: Every day | ORAL | Status: DC

## 2019-02-11 MED ORDER — NAPROXEN 250 MG PO TABS
500.0000 mg | ORAL_TABLET | Freq: Two times a day (BID) | ORAL | Status: DC
  Administered 2019-02-11: 500 mg via ORAL
  Filled 2019-02-11: qty 2

## 2019-02-11 MED ORDER — MECLIZINE HCL 25 MG PO TABS: 25.0000 mg | ORAL_TABLET | Freq: Three times a day (TID) | ORAL | Status: DC | PRN

## 2019-02-11 MED ORDER — DONEPEZIL HCL 10 MG PO TABS: 10.0000 mg | ORAL_TABLET | Freq: Every day | ORAL | Status: DC

## 2019-02-11 MED ORDER — LEVOTHYROXINE SODIUM 75 MCG PO TABS
175.0000 ug | ORAL_TABLET | Freq: Every day | ORAL | Status: DC
  Administered 2019-02-11: 175 ug via ORAL
  Filled 2019-02-11: qty 1

## 2019-02-11 MED ORDER — INSULIN ASPART 100 UNIT/ML ~~LOC~~ SOLN
8.0000 [IU] | Freq: Three times a day (TID) | SUBCUTANEOUS | Status: DC
  Administered 2019-02-11: 12 [IU] via SUBCUTANEOUS
  Administered 2019-02-11: 14 [IU] via SUBCUTANEOUS

## 2019-02-11 NOTE — ED Notes (Signed)
TTS assessment attempted, per Anderson Malta, RN, patient is not ready and they will call TTS back when patient is ready for consult.

## 2019-02-11 NOTE — Progress Notes (Signed)
Patient is seen by me via tele-psych and I have consulted with Dr. Dwyane Dee.  Patient reports that since she was in the hospital last month for acute respiratory distress things have been going downhill.  She reports that her older daughter, Mickel Baas ward, moved back from [place] and she was living with her after she got out of the hospital because Mickel Baas is a Therapist, sports.  She states that she was feeling very pushed around and that her daughter was "like a Physiological scientist."  She states that she continue to try to control her life and she was not happy with this and this caused him to have arguments.  She stated that during the argument she would make comments about better off exiting this world, but denies ever having any type of intent or plan.  Yesterday she had went back to her home where she lives with her younger daughter Ekaterina Denise and they got into an argument as well about the patient being gone all day from the house and grocery shopping and not doing anything around the house.  She then again made a comment due to being upset and being in an argument.  The patient states that she has had some depression and she has been on Zoloft for years and a few months ago she was switched to Cymbalta.  She also reports that she was on Xanax for a long time and then it was tapered down and she was switched over to trazodone for sleep.  She continues to deny any suicidal or homicidal ideations and denies any hallucinations.  She states that she only made the comments because she was upset and angry.  She could not remember her youngest daughter's name who she lives with, so she gained a lower dose phone number which is [phone].  Mickel Baas reported that the patient get easily agitated and "she cannot throw a history for it every time she hears something she does not like what she hears from [name]."  She feels her mother needs better coping skills.  She provides me with her sister, Mendel Ryder phone number which is [phone].  Mendel Ryder  reports the same story as the patient and told.  She reports that yesterday her mother was gone most of the day and did not take care of anything around the house and when she came home with a lot of groceries they had an argument about where she had been in why she had bought some many groceries and while she was throwing food away to make room for new groceries.  She states they did get into an argument and that was when she made a comment about exiting the world again.  The patient reported that her daughter Mendel Ryder had threatened her to go to the hospital or she would call the police so the patient came to the hospital.  The daughter then went and had IVC paperwork drawn upon the patient.  Both of the daughters report that her comments have happened during arguments and that there is been no attempts, plans, or intent.  After I finished the assessment the patient's emergency contact, her brother Ethlyn Daniels, contacted me and wanted to inform us that they were concerned about the patient due to a family history of suicide.  The patient's brother was informed that the patient did not meet inpatient criteria and that we are assisting the patient with other options.  The patient's brother stated that they would seek other legal options such as possibly taking the  patient's rights away and 1 of the family members becoming her legal guardian.  The patient stated that she would be interested in [place] or IOP and disposition was able to get the patient an appointment on Tuesday 317 at 8:30 AM with Dellia Nims for placement into IOP or PHP.  At this time the patient does not meet inpatient criteria and is psychiatrically cleared.  I have contacted Dr. Vena Austria and notified him of the recommendations and the plan.

## 2019-02-11 NOTE — Discharge Instructions (Addendum)
It was our pleasure to provide your ER care today - we hope that you feel better.  Follow up closely with the Intensive Outpatient Program.  Also follow up closely with primary care doctor and counselor as outpatient.  Return to ER if worse, new symptoms, other concern.

## 2019-02-11 NOTE — ED Provider Notes (Signed)
[facility] EMERGENCY DEPARTMENT Provider Note   CSN: [id number] Arrival date & time: 02/10/19  2356    History   Chief Complaint Chief Complaint  Patient presents with  . Suicidal    HPI Bonnie Gonzalez is a 63 y.o. female.     The history is provided by the patient.  Mental Health Problem  Presenting symptoms: suicidal thoughts   Presenting symptoms: no suicidal threats and no suicide attempt   Degree of incapacity (severity):  Mild Timing:  Constant Progression:  Worsening Chronicity:  New Context: noncompliance   Treatment compliance:  Untreated Relieved by:  None tried Worsened by:  Nothing Ineffective treatments:  None tried   Past Medical History:  Diagnosis Date  . Allergy   . Anxiety   . Arthritis   . Depression   . Diabetes mellitus   . GERD (gastroesophageal reflux disease)   . Hypothyroidism   . IBS (irritable bowel syndrome)   . Insomnia     Patient Active Problem List   Diagnosis Date Noted  . MDD (major depressive disorder), recurrent severe, without psychosis ([place])   . Acute respiratory failure with hypoxia ([place]) 01/10/2019  . Postinflammatory pulmonary fibrosis ([place]) 09/15/2018  . COPD GOLD II if use FEV1/VC 09/01/2018  . DOE (dyspnea on exertion) 08/31/2018  . Memory loss 03/31/2018  . Bilateral leg edema 03/31/2018  . Depression with anxiety 01/20/2018  . Type 2 diabetes mellitus without complications ([place]) 63/12/6008  . Back pain 10/18/2015  . OSA (obstructive sleep apnea) 07/26/2015  . Morbid obesity due to excess calories ([place]) 05/15/2014  . Vertigo 03/29/2014  . DYSHIDROTIC ECZEMA 12/13/2010  . Hypothyroidism 10/01/2010  . HYPERLIPIDEMIA, MIXED 07/20/2009  . ALLERGIC RHINITIS 07/20/2009  . IRRITABLE BOWEL SYNDROME 07/13/2009  . TOBACCO USE 03/03/2009  . GERD 11/03/2007  . HEADACHE 11/03/2007  . ABDOMINAL PAIN, EPIGASTRIC 11/03/2007    Past Surgical History:  Procedure Laterality Date  . benign tumor       removed from groin  . WRIST ARTHROCENTESIS N/A 2001     OB History   No obstetric history on file.      Home Medications    Prior to Admission medications   Medication Sig Start Date End Date Taking? Authorizing Provider  albuterol (PROVENTIL HFA;VENTOLIN HFA) 108 (90 Base) MCG/ACT inhaler Inhale 2 puffs into the lungs every 4 (four) hours as needed for wheezing or shortness of breath. 06/17/18  Yes Laurey Morale, MD  ALPRAZolam Duanne Moron) 0.5 MG tablet TAKE 1 TABLET (0.5 MG TOTAL) BY MOUTH 2 (TWO) TIMES DAILY AS NEEDED. Patient taking differently: Take 0.25 mg by mouth See admin instructions. Take every night and daily as needed for anxiety 07/21/18  Yes Laurey Morale, MD  atorvastatin (LIPITOR) 40 MG tablet Take 1 tablet (40 mg total) by mouth daily. Patient taking differently: Take 80 mg by mouth at bedtime.  01/19/18  Yes Laurey Morale, MD  benzonatate (TESSALON) 200 MG capsule Take 1 capsule (200 mg total) by mouth 2 (two) times daily as needed for cough. 03/09/18  Yes Laurey Morale, MD  donepezil (ARICEPT) 10 MG tablet TAKE 1 TABLET BY MOUTH AT BEDTIME Patient taking differently: Take 10 mg by mouth at bedtime.  12/23/18  Yes Laurey Morale, MD  DULoxetine (CYMBALTA) 60 MG capsule Take 1 capsule (60 mg total) by mouth daily. Patient taking differently: Take 60 mg by mouth at bedtime.  01/19/18  Yes Laurey Morale, MD  esomeprazole ([place])  40 MG capsule Take 30- 60 min before your first and last meals of the day Patient taking differently: Take 40 mg by mouth daily before breakfast.  09/28/18  Yes Tanda Rockers, MD  furosemide (LASIX) 40 MG tablet TAKE 1 TABLET BY MOUTH ONCE DAILY Patient taking differently: Take 40 mg by mouth daily.  10/25/18  Yes Laurey Morale, MD  glucose 4 GM chewable tablet Chew 1 tablet (4 g total) by mouth as needed for low blood sugar (< 70). 01/16/19  Yes Dessa Phi, DO  halobetasol (ULTRAVATE) 0.05 % cream Apply topically 2 (two) times daily as needed.  Patient taking differently: Apply 1 application topically 2 (two) times daily as needed (for itching).  06/04/18  Yes Laurey Morale, MD  hydrOXYzine (VISTARIL) 25 MG capsule TAKE 1 CAPSULE BY MOUTH THREE TIMES A DAY AS NEEDED Patient taking differently: Take 25 mg by mouth See admin instructions. Take 25 mg by mouth 2 times a day and an additional 25 mg as needed for itching 08/25/18  Yes Laurey Morale, MD  Hyprom-Naphaz-Polysorb-Zn Sulf (CLEAR EYES COMPLETE) SOLN Place 1-2 drops into both eyes as needed (for itching).   Yes [provider]  insulin aspart (NOVOLOG) 100 UNIT/ML injection Use sliding scale provided at time of discharge, administer 3 times daily with meals Patient taking differently: Inject 8-14 Units into the skin 3 (three) times daily with meals. Use sliding scale provided at time of discharge, administer 3 times daily with meals 01/16/19 01/16/20 Yes Dessa Phi, DO  Insulin Degludec 200 UNIT/ML SOPN Inject 80 Units into the skin every morning. 02/04/19  Yes [provider]  levothyroxine (SYNTHROID, LEVOTHROID) 175 MCG tablet TAKE 1 TABLET BY MOUTH ONCE DAILY BEFORE BREAKFAST Patient taking differently: Take 175 mcg by mouth daily before breakfast.  03/31/18  Yes Laurey Morale, MD  meclizine (ANTIVERT) 50 MG tablet Take 0.5 tablets (25 mg total) by mouth 3 (three) times daily as needed. Patient taking differently: Take 25 mg by mouth 3 (three) times daily as needed for dizziness.  08/31/14  Yes Daniel Nones, [place], PA-C  methocarbamol (ROBAXIN) 750 MG tablet Take 1 tablet (750 mg total) by mouth every 6 (six) hours as needed for muscle spasms. 07/28/18  Yes Laurey Morale, MD  naproxen (NAPROSYN) 500 MG tablet Take 500 mg by mouth 2 (two) times daily.  07/20/18  Yes [provider]  Tiotropium Bromide-Olodaterol (STIOLTO RESPIMAT) 2.5-2.5 MCG/ACT AERS Inhale 2.5 mcg into the lungs daily. 02/04/19  Yes [provider]  traZODone (DESYREL) 50 MG tablet Take 25  mg by mouth at bedtime.    Yes [provider]    Family History Family History  Problem Relation Age of Onset  . Asthma Other        fhx  . Depression Other        fhx  . Diabetes Other        fhx  . Parkinsonism Other        fhx  . Lupus Other        fhx  . Alzheimer's disease Father        Deceased, 26  . Diabetes Mellitus II Father   . Arthritis/Rheumatoid Mother        Living, 48  . Diabetes Mellitus II Sister   . Diabetes Mellitus II Brother     Social History Social History   Tobacco Use  . Smoking status: Former Smoker    Packs/day: 1.00  Years: 35.00    Pack years: 35.00    Types: Cigarettes    Last attempt to quit: 12/01/2008    Years since quitting: 10.2  . Smokeless tobacco: Never Used  Substance Use Topics  . Alcohol use: No    Alcohol/week: 0.0 standard drinks  . Drug use: No     Allergies   Aspirin; Codeine; Fluoxetine hcl; Hydrocodone-acetaminophen; Oxycodone-acetaminophen; and Penicillins   Review of Systems Review of Systems  Psychiatric/Behavioral: Positive for suicidal ideas.  All other systems reviewed and are negative.    Physical Exam Updated Vital Signs BP (!) 128/55 (BP Location: Left Arm)   Pulse 64   Temp 97.8 F (36.6 C) (Oral)   Resp 17   SpO2 95%   Physical Exam Vitals signs and nursing note reviewed.  Constitutional:      Appearance: She is well-developed.  HENT:     Head: Normocephalic and atraumatic.     Mouth/Throat:     Mouth: Mucous membranes are dry.     Pharynx: Oropharynx is clear.  Eyes:     Extraocular Movements: Extraocular movements intact.     Conjunctiva/sclera: Conjunctivae normal.     Pupils: Pupils are equal, round, and reactive to light.  Neck:     Musculoskeletal: Normal range of motion.  Cardiovascular:     Rate and Rhythm: Normal rate and regular rhythm.  Pulmonary:     Effort: Pulmonary effort is normal. No respiratory distress.     Breath sounds: No stridor.  Abdominal:      General: There is no distension.  Musculoskeletal: Normal range of motion.        General: No swelling or tenderness.  Skin:    General: Skin is warm and dry.  Neurological:     General: No focal deficit present.     Mental Status: She is alert.      ED Treatments / Results  Labs (all labs ordered are listed, but only abnormal results are displayed) Labs Reviewed  COMPREHENSIVE METABOLIC PANEL - Abnormal; Notable for the following components:      Result Value   Glucose, Bld 205 (*)    BUN 25 (*)    All other components within normal limits  ETHANOL - Abnormal; Notable for the following components:   Alcohol, Ethyl (B) 17 (*)    All other components within normal limits  ACETAMINOPHEN LEVEL - Abnormal; Notable for the following components:   Acetaminophen (Tylenol), Serum <10 (*)    All other components within normal limits  CBC - Abnormal; Notable for the following components:   RBC 5.26 (*)    MCH 24.3 (*)    MCHC 29.0 (*)    RDW 17.7 (*)    All other components within normal limits  CBG MONITORING, ED - Abnormal; Notable for the following components:   Glucose-Capillary 202 (*)    All other components within normal limits  SALICYLATE LEVEL  RAPID URINE DRUG SCREEN, HOSP PERFORMED    EKG None  Radiology No results found.  Procedures Procedures (including critical care time)  Medications Ordered in ED Medications  atorvastatin (LIPITOR) tablet 80 mg (has no administration in time range)  donepezil (ARICEPT) tablet 10 mg (has no administration in time range)  DULoxetine (CYMBALTA) DR capsule 60 mg (has no administration in time range)  pantoprazole (PROTONIX) EC tablet 40 mg (has no administration in time range)  furosemide (LASIX) tablet 40 mg (has no administration in time range)  insulin aspart (novoLOG) injection 8-14  Units (has no administration in time range)  levothyroxine (SYNTHROID, LEVOTHROID) tablet 175 mcg (has no administration in time range)   meclizine (ANTIVERT) tablet 25 mg (has no administration in time range)  naproxen (NAPROSYN) tablet 500 mg (has no administration in time range)  traZODone (DESYREL) tablet 25 mg (has no administration in time range)     Initial Impression / Assessment and Plan / ED Course  I have reviewed the triage vital signs and the nursing notes.  Pertinent labs & imaging results that were available during my care of the patient were reviewed by me and considered in my medical decision making (see chart for details).   SI. No plan but worsening depression and thoughts. Multiple risk factors as well. TTS consulte,d recommends admission. Home meds ordered. First exam completed.   Final Clinical Impressions(s) / ED Diagnoses   Final diagnoses:  None    ED Discharge Orders    None       Mica Ramdass, Corene Cornea, MD 02/11/19 [phone]

## 2019-02-11 NOTE — ED Notes (Signed)
Paperwork has been completed and IVC has been rescinded.

## 2019-02-11 NOTE — ED Notes (Signed)
Breakfast ordered 

## 2019-02-11 NOTE — ED Notes (Signed)
All belongings returned to patient

## 2019-02-11 NOTE — ED Notes (Signed)
IVC paperwork brought by [facility].  Pt also started TTS.

## 2019-02-11 NOTE — ED Provider Notes (Signed)
[place] team, Darnelle Maffucci M/Dr Dwyane Dee have reassessed and indicate patient is clear to d/c. They have provided additional resources, and f/u plan at IOP.   Pt currently alert, content, nad. Normal mood and affect. No SI.   Pt currently appears stable for d/c.      Lajean Saver, MD 02/11/19 [phone]

## 2019-02-11 NOTE — BH Assessment (Addendum)
Tele Assessment Note   Patient Name: Jessyca I Baley MRN: [id number] Referring Physician: Dr. Dayna Barker Location of Patient: [facility] Location of Provider: [place] Department  Persephone I Hoback is an 63 y.o. female presenting with SI with no plan. Patient reported SI onset 1 week. Patient stated "I wanna give up, I have no joy in my life, why stick around, I know God won't like it, I would be good for a couple of days then I am feeling bad". Patient denied history of suicide attempts and self harming behaviors. Patient reported being depressed for majority of her life with fleeting thoughts of occasional suicide. Patient denied any prior mental health inpatient treatment. Patient reported 1st therapy appointment for [place] scheduled for this coming Tuesday. Patient reported already attending group at [place]. Patient reported medical stressors, after 1 month being told she had acute respiratory distress,  COPD after seeing a doctor for 1 year whom didn't diagnosis stating, "if I had to carry an extra 100 lbs I will be breathing hard too", patient stated that doctor hurt her feelings. Patient also reported having controlled diabetes for 10 years. Patient reported main medical stressor is her new insurance where she lost doctor of 20 years and having to start over with new team of doctors, pcp, pulmonologist, endocrinologist.   Patient resides alone. Patient is employed at [employer] and is currently on FMLA. Patient reported having sleep apnea with the average amount of sleep 3-6 hours. Patient reported good appetite and stated she was a food addict. Patient reported being divorced and having 4 adult daughters. Patient reported "I am ready to disown my 4 kids, they push my buttons to far then I fly into anger, I didn't use to be that way, not taking time to react just flying into action". Patient reported her children are like drill  sargents, "shove it down my throat, I have no opinion and that they are going to make me do it, if they would just change their negative approach I could work with them". Patient reported history of family suicide, maternal cousin and 2 uncles. Patient was cooperative and tearful throughout entire assessment.   UDS in process ETOH 17  Diagnosis: Major depressive disorder  Past Medical History:  Past Medical History:  Diagnosis Date  . Allergy   . Anxiety   . Arthritis   . Depression   . Diabetes mellitus   . GERD (gastroesophageal reflux disease)   . Hypothyroidism   . IBS (irritable bowel syndrome)   . Insomnia     Past Surgical History:  Procedure Laterality Date  . benign tumor      removed from groin  . WRIST ARTHROCENTESIS N/A 2001    Family History:  Family History  Problem Relation Age of Onset  . Asthma Other        fhx  . Depression Other        fhx  . Diabetes Other        fhx  . Parkinsonism Other        fhx  . Lupus Other        fhx  . Alzheimer's disease Father        Deceased, 69  . Diabetes Mellitus II Father   . Arthritis/Rheumatoid Mother        Living, 13  . Diabetes Mellitus II Sister   . Diabetes Mellitus II Brother     Social History:  reports that she quit smoking about 10 years ago. Her smoking use included cigarettes. She has a 35.00 pack-year smoking history. She has never used smokeless tobacco. She reports that she does not drink alcohol or use drugs.  Additional Social History:  Alcohol / Drug Use Pain Medications: see MAR Prescriptions: see MAR Over the Counter: see MAR  CIWA: CIWA-Ar BP: (!) 190/80 Pulse Rate: 95 COWS:    Allergies:  Allergies  Allergen Reactions  . Aspirin Anaphylaxis    Mother had to be resuscitated after ingesting this  . Codeine Hives  . Fluoxetine Hcl Itching and Other (See Comments)    "Hair itching"  . Hydrocodone-Acetaminophen Hives  . Oxycodone-Acetaminophen Hives  . Penicillins Hives     Did it involve swelling of the face/tongue/throat, SOB, or low BP? Yes-hives Did it involve sudden or severe rash/hives, skin peeling, or any reaction on the inside of your mouth or nose? Unk Did you need to seek medical attention at a hospital or doctor's office? Yes When did it last happen? Was in her 86's If all above answers are "NO", may proceed with cephalosporin use.     Home Medications: (Not in a hospital admission)   OB/GYN Status:  No LMP recorded. Patient is postmenopausal.  General Assessment Data Assessment unable to be completed: Yes Reason for not completing assessment: (patient not ready per RN) Location of Assessment: [facility] ED TTS Assessment: In system Is this a Tele or Face-to-Face Assessment?: Tele Assessment Is this an Initial Assessment or a Re-assessment for this encounter?: Initial Assessment Patient Accompanied by:: N/A Language Other than English: No Living Arrangements: (family home alone) What gender do you identify as?: Female Marital status: Divorced Pregnancy Status: Unknown Living Arrangements: Alone Can pt return to current living arrangement?: Yes Admission Status: Voluntary Is patient capable of signing voluntary admission?: Yes Referral Source: Self/Family/Friend     Crisis Care Plan Living Arrangements: Alone Legal Guardian: (self) Name of Psychiatrist: ([facility]) Name of Therapist: ([facility])  Education Status Is patient currently in school?: No Is the patient employed, unemployed or receiving disability?: Employed  Risk to self with the past 6 months Suicidal Ideation: Yes-Currently Present Has patient been a risk to self within the past 6 months prior to admission? : Yes Suicidal Intent: Yes-Currently Present Has patient had any suicidal intent within the past 6 months prior to admission? : Yes Is patient at risk for suicide?: Yes Suicidal Plan?: No Has patient had any suicidal plan  within the past 6 months prior to admission? : No Access to Means: No What has been your use of drugs/alcohol within the last 12 months?: (none) Previous Attempts/Gestures: No How many times?: (0) Other Self Harm Risks: (none) Triggers for Past Attempts: (medical) Intentional Self Injurious Behavior: None Family Suicide History: Yes(cousin and 2 uncles maternal side) Recent stressful life event(s): (medical) Persecutory voices/beliefs?: No Depression: Yes Depression Symptoms: Tearfulness, Isolating, Fatigue, Loss of interest in usual pleasures, Feeling angry/irritable, Feeling worthless/self pity Substance abuse history and/or treatment for substance abuse?: No  Risk to Others within the past 6 months Homicidal Ideation: No Does patient have any lifetime risk of violence toward others beyond the six months prior to admission? : No Thoughts of Harm to Others: No Current Homicidal Intent: No Current Homicidal Plan: No Access to Homicidal Means: No Identified Victim: (n/a) History of harm to others?: No Assessment of Violence: None Noted Violent Behavior Description: (none) Does patient have access to weapons?:  No Criminal Charges Pending?: No Does patient have a court date: No Is patient on probation?: No  Psychosis Hallucinations: None noted Delusions: None noted  Mental Status Report Appearance/Hygiene: Unremarkable Eye Contact: Fair Motor Activity: Freedom of movement Speech: Logical/coherent Level of Consciousness: Alert Mood: Anxious, Depressed Affect: Anxious, Depressed Anxiety Level: Moderate Thought Processes: Coherent, Relevant Judgement: Partial Orientation: Person, Place, Time, Situation Obsessive Compulsive Thoughts/Behaviors: None  Cognitive Functioning Concentration: Fair Memory: Recent Intact Is patient IDD: No Insight: Fair Impulse Control: Fair Appetite: Good Have you had any weight changes? : No Change Sleep: No Change Total Hours of Sleep:  (3-6) Vegetative Symptoms: None  ADLScreening [facility] Assessment Services) Patient's cognitive ability adequate to safely complete daily activities?: Yes Patient able to express need for assistance with ADLs?: Yes Independently performs ADLs?: Yes (appropriate for developmental age)  Prior Inpatient Therapy Prior Inpatient Therapy: No  Prior Outpatient Therapy Prior Outpatient Therapy: No Does patient have an ACCT team?: No Does patient have Intensive In-House Services?  : No Does patient have [facility] services? : No Does patient have P4CC services?: No  ADL Screening (condition at time of admission) Patient's cognitive ability adequate to safely complete daily activities?: Yes Patient able to express need for assistance with ADLs?: Yes Independently performs ADLs?: Yes (appropriate for developmental age)   Disposition:  Disposition Initial Assessment Completed for this Encounter: Yes  Patriciaann Clan, NP, patient meets inpatient criteria for geropsychiatry unit. TTS to secure placement. Anderson Malta, RN, informed of disposition. Per RN, daughter is having patient IVC'd.  This service was provided via telemedicine using a 2-way, interactive audio and video technology.  Names of all persons participating in this telemedicine service and their role in this encounter. Name: Amaiah Harden Role: Patient  Name: Kirtland Bouchard, [facility] Role: TTS Clinician  Name:  Role:   Name:  Role:     Venora Maples, [facility] 02/11/2019 3:32 AM

## 2019-02-11 NOTE — ED Triage Notes (Addendum)
Pt reports thought of self harm, w/o plan.  Pt reports tremendous family conflict "feels like my daughter is trying to control my life."   No prior hx.  Pt reports she was just trying to leave the house and thinking "Why am I even here, I should just end it."

## 2019-02-11 NOTE — ED Notes (Signed)
Patient doing TTS reassessment at this time.

## 2019-02-11 NOTE — ED Notes (Signed)
Assunta Gambles called @ 1401-per Elmyra Ricks, RN-called by Levada Dy

## 2019-02-11 NOTE — ED Notes (Addendum)
Patriciaann Clan, NP, patient meets inpatient criteria for geropsychiatry unit. TTS to secure placement. Anderson Malta, RN, informed of disposition. Per RN, daughter is having patient IVC'd.

## 2019-02-15 ENCOUNTER — Ambulatory Visit: Payer: Self-pay | Admitting: Pharmacist

## 2019-02-15 ENCOUNTER — Other Ambulatory Visit: Payer: Self-pay | Admitting: Pharmacist

## 2019-02-15 ENCOUNTER — Other Ambulatory Visit: Payer: Self-pay | Admitting: *Deleted

## 2019-02-15 NOTE — Patient Outreach (Signed)
[name]) Care Management  02/15/2019  Bonnie Gonzalez 18-Feb-1956 [id number]   Call received from member to discuss frustration regarding plan of care.  State she was in the ED for suicidal ideations, was told that she would need to follow up with for intensive behavioral therapy.  She is concerned that she have not been contacted by anyone yet, was discharged from the ED on 3/13.  Advised to contact [facility] to inquire about starting therapy.    Report her depression has continued as she is frustrated with her daughter, feels like she is "running my life."  State she is following her plan of care, taking medications as instructed and following diet, but her daughter will allegedly still blame her if her blood sugars are elevated.  She also report that her daughters are concerned with her going out of the home for appointments (scheduled for first one on one session with family therapist today), advised to inquire about completing session over the phone.  Verbalized understanding.    This care manager inquired about member's diabetes management.  State her blood sugars have decreased, but she still has some elevated numbers.  Will meet with nutritionist next week and with endocrinologist on 4/10.  Denies any urgent concerns, will follow up within the next month.  THN CM Care Plan Problem One     Most Recent Value  Care Plan Problem One  Knowledge deficit regarding management of chronic medical conditions as evidenced by recent hospitalization  Role Documenting the Problem One  Care Management [place] for Problem One  Active  THN Long Term Goal   Member's A1C will be decreased to </= 8 within the next 3 months  THN Long Term Goal Start Date  01/20/19  Interventions for Problem One Long Term Goal  Reviewed follow up with member regarding nutritionist and endocrinologist appointments  [facility] CM Short Term Goal #1   Member will report taking medications as  prescribed, especially inhalers, over the next 4 weeks  THN CM Short Term Goal #1 Start Date  01/20/19  [facility] CM Short Term Goal #1 Met Date  02/15/19  THN CM Short Term Goal #2   Member will report schedule for intensive behavioral therapy within the next 2 weeks   THN CM Short Term Goal #2 Start Date  02/15/19  Interventions for Short Term Goal #2  Member advised to contact [facility] regarding concern for face to face therapy and intensive therapy     [name], RN, MSN [place] Manager [phone]

## 2019-02-15 NOTE — Patient Outreach (Signed)
[place] [facility]) Care Management  [place]  02/15/2019  Bonnie Gonzalez 06/30/1956 [id number]   Reason for call: medication assistance/management  Unsuccessful telephone call attempt #1 to patient.   HIPAA compliant voicemail left requesting a return call  Plan:  I will make another outreach attempt to patient within 3-4 business days    [name], PharmD, [place]  [phone]

## 2019-02-16 ENCOUNTER — Telehealth (HOSPITAL_COMMUNITY): Payer: Self-pay | Admitting: Psychiatry

## 2019-02-17 ENCOUNTER — Ambulatory Visit: Payer: Self-pay | Admitting: *Deleted

## 2019-02-18 ENCOUNTER — Ambulatory Visit: Payer: Self-pay | Admitting: Pharmacist

## 2019-02-18 ENCOUNTER — Other Ambulatory Visit: Payer: Self-pay | Admitting: Pharmacist

## 2019-02-18 NOTE — Patient Outreach (Signed)
[place] [facility]) Care Management  02/18/2019  Bonnie Gonzalez [birth year] [id number]  Successful care coordination call to patient's daughter Mickel Baas) to check to see if they had received PAP packet.  Patient is no longer staying with daughter due to her mental status.  Daughter and patient decided it would be best for patient to return to her own home.  Will re-mail PAP packet to patient at her home address in [place].   PLAN: -I will f/u with the patient within 2 weeks   [name], PharmD, [place]  [phone]

## 2019-02-22 ENCOUNTER — Other Ambulatory Visit: Payer: Self-pay | Admitting: Pharmacy Technician

## 2019-02-22 NOTE — Patient Outreach (Signed)
[facility]) Care Management  02/22/2019  Malaia I Hapke 10-Dec-1955 [id number]   Informed by [facility] R[facility] Lottie Dawson that patient is requesting a new application due to misplacing other one. Prepared application to be mailed out.  Maud Deed Chana Bode [place] Certified Pharmacy Technician [place] Management Direct Dial:[phone]

## 2019-03-01 IMAGING — MR MR LUMBAR SPINE W/O CM
5 series · 48 of 48 positions shown · non-contrast
Comparison: None.

CLINICAL DATA: Back pain for over 6 weeks, midline and without
sciatica.

EXAM:
MRI LUMBAR SPINE WITHOUT CONTRAST
TECHNIQUE: Multiplanar, multisequence MR imaging of the lumbar spine was
performed. No intravenous contrast was administered.

[Series 2: T2 post-contrast · sagittal · 4.0mm · 1.09mm/px · 6 of 12 slices shown]
[im 1/12]
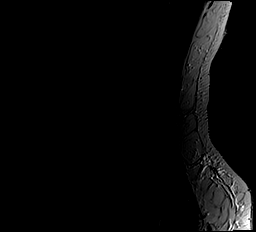
[im 3/12]
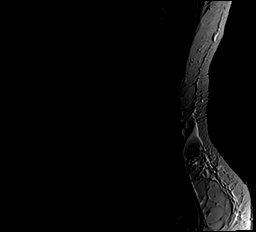
[im 5/12]
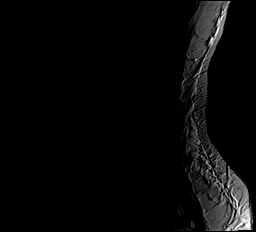
[im 7/12]
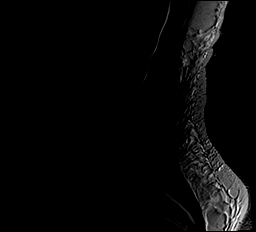
[im 9/12]
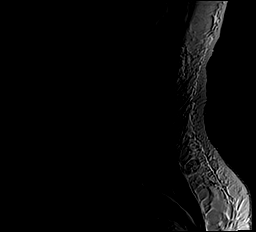
[im 12/12]
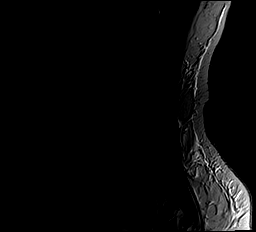

[Series 3: tirm sag · sagittal · 4.0mm · 0.55mm/px · 6 of 12 slices shown]
[im 1/12]
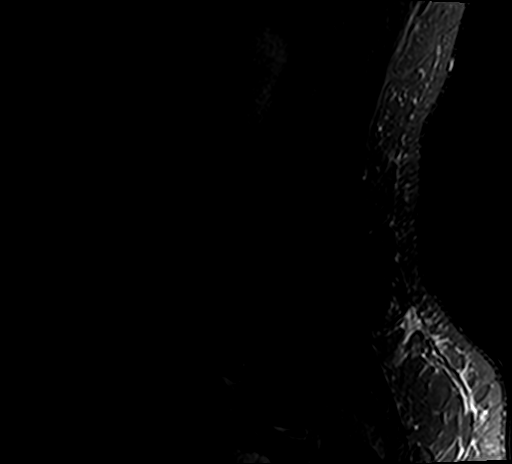
[im 3/12]
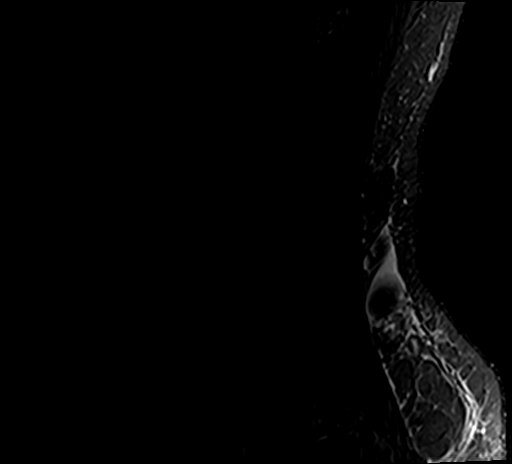
[im 5/12]
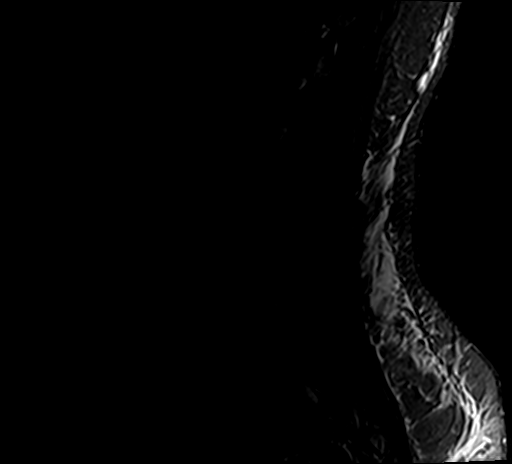
[im 7/12]
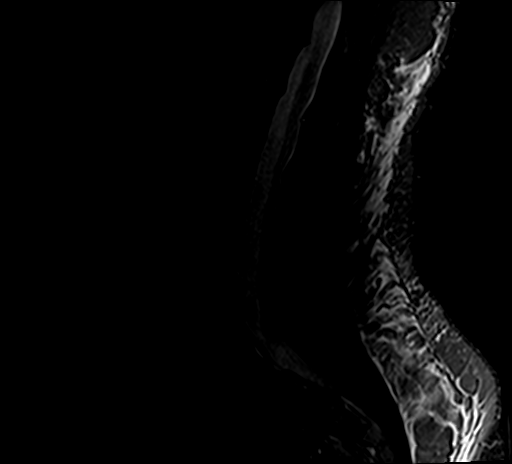
[im 9/12]
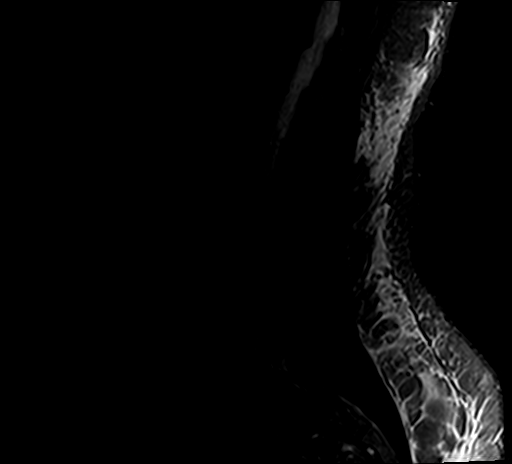
[im 12/12]
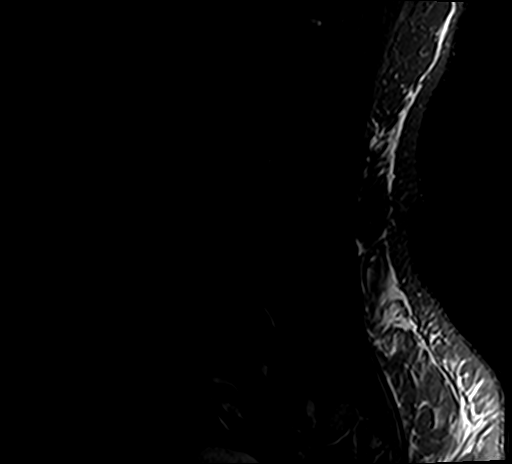

[Series 4: T1 · sagittal · 4.0mm · 1.09mm/px · 6 of 12 slices shown (1 of 2)]
[im 1/12]
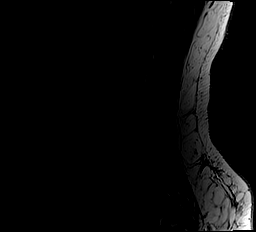
[im 3/12]
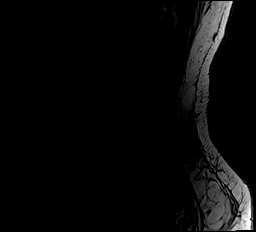
[im 5/12]
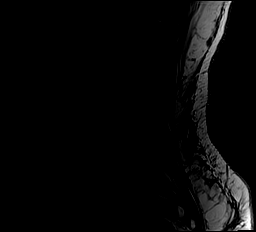
[im 7/12]
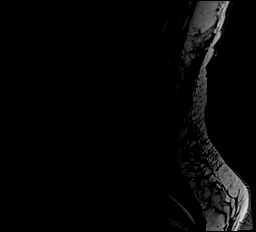
[im 9/12]
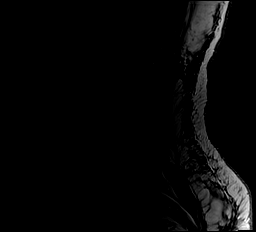
[im 12/12]
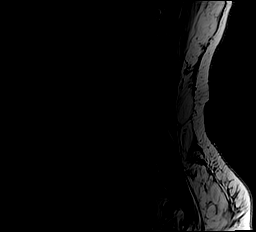

[Series 7: T2 · axial · 4.0mm · 0.78mm/px · z∈[+97,+340]mm · 15 of 32 slices shown]
[im 1/32]
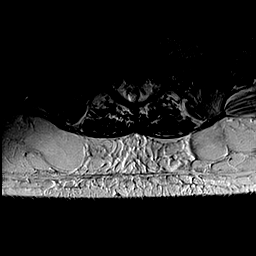
[im 3/32]
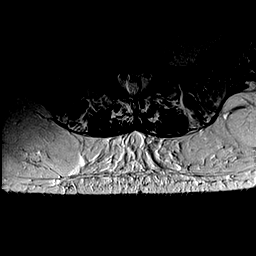
[im 5/32]
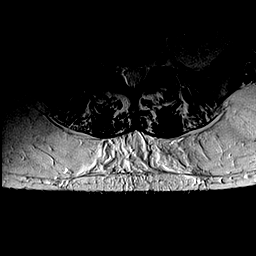
[im 7/32]
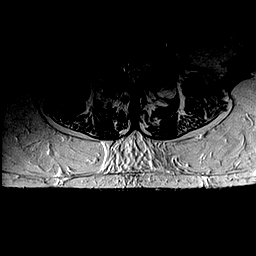
[im 9/32]
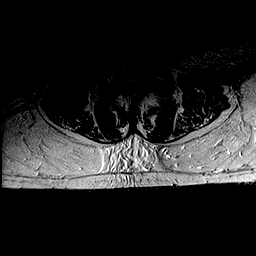
[im 12/32]
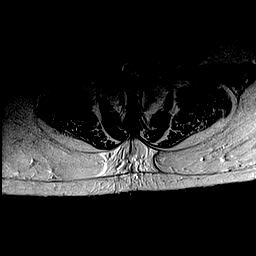
[im 14/32]
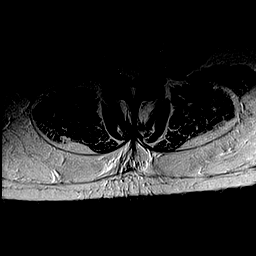
[im 16/32]
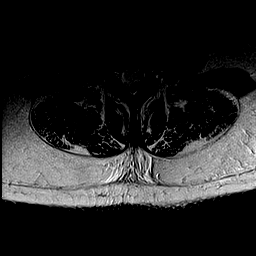
[im 18/32]
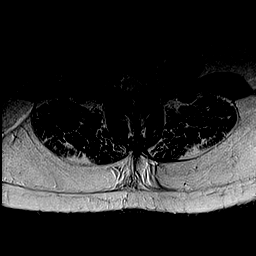
[im 20/32]
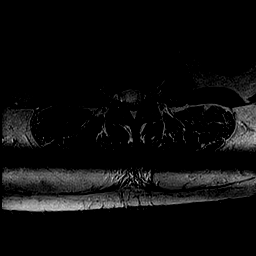
[im 23/32]
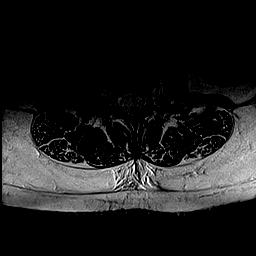
[im 25/32]
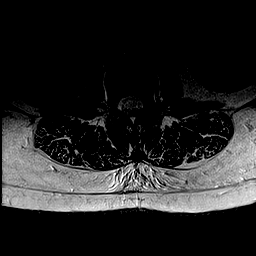
[im 27/32]
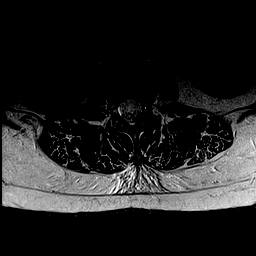
[im 29/32]
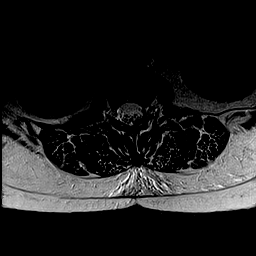
[im 32/32]
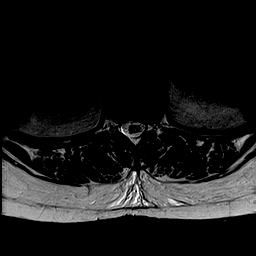

[Series 8: T1 · axial · 4.0mm · 0.78mm/px · z∈[+97,+340]mm · 15 of 32 slices shown (2 of 2)]
[im 1/32]
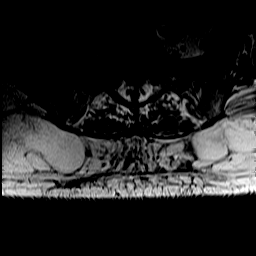
[im 3/32]
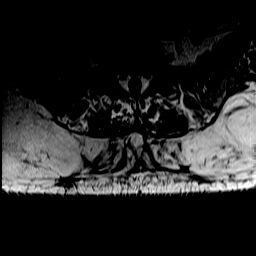
[im 5/32]
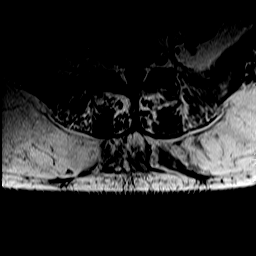
[im 7/32]
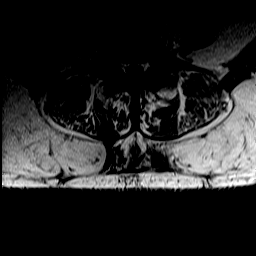
[im 9/32]
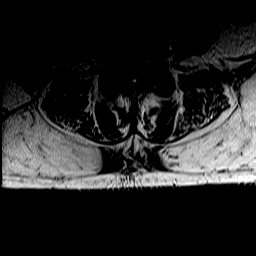
[im 12/32]
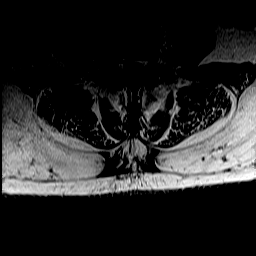
[im 14/32]
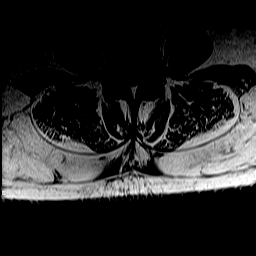
[im 16/32]
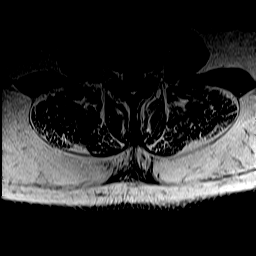
[im 18/32]
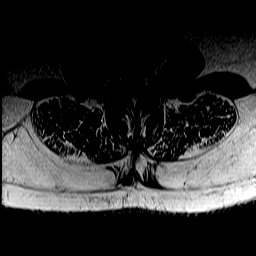
[im 20/32]
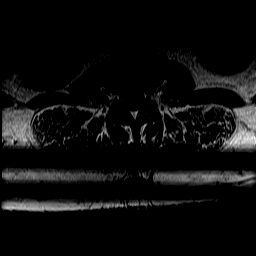
[im 23/32]
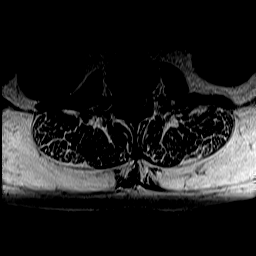
[im 25/32]
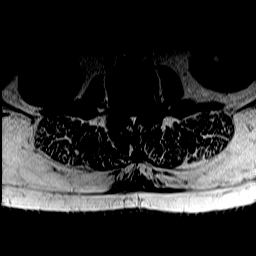
[im 27/32]
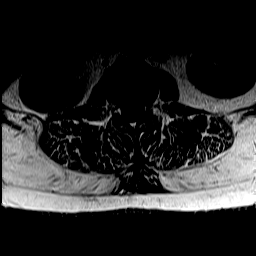
[im 29/32]
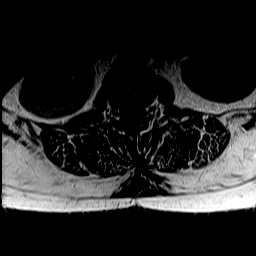
[im 32/32]
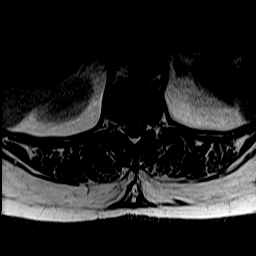

[48 of 48 positions shown; findings below may reference images not displayed]

FINDINGS: Segmentation:  5 lumbar type vertebral bodies

Alignment:  Normal

Vertebrae: Remote T11 superior endplate fracture by 1722 abdominal
CT. No acute or interval fracture. No discitis or aggressive bone
lesion.

Conus medullaris and cauda equina: Conus extends to the L1 level.
Conus and cauda equina appear normal.

Paraspinal and other soft tissues: Cystic changes in the pelvis are
better seen on hip MRI. Subcutaneous fat edematous signal as a
benign pattern.

Disc levels:

T12- L1: Minor disc bulging on sagittal images.  No impingement

L1-L2: Unremarkable.

L2-L3: Unremarkable.

L3-L4: Unremarkable.

L4-L5: Facet degeneration with spurring and bilateral joint
effusion. No herniation or impingement

L5-S1:Moderate degenerative facet spurring.
IMPRESSION: 1. L4-5 advanced degenerative facet arthropathy with bilateral joint
effusion.
2. L5-S1 moderate degenerative facet arthropathy.
3. No herniation or impingement.

## 2019-03-03 ENCOUNTER — Other Ambulatory Visit: Payer: Self-pay | Admitting: *Deleted

## 2019-03-03 ENCOUNTER — Other Ambulatory Visit: Payer: Self-pay

## 2019-03-03 NOTE — Patient Outreach (Signed)
[facility]) Care Management  03/03/2019  Bonnie Gonzalez 1956/06/13 [id number]   Call placed to member to follow up on current medication status and management of diabetes.  She report that she is back in her own home and doing well.  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  Advised to contact place of employment to inquire about precautions that are being taken prior to going back to work.  She also report a telephone/virtual visit with psych, will follow up for schedule going forward.    Report her blood sugars are still running high, today was 244.  She state she has been trying to follow her diabetic diet but will reach back out to her nutritionist for advice.  Her PT has been postponed due to Covid-19 but she report she has been doing strengthening exercises independently.  Denies any urgent concerns, advised to contact this care manager with questions.  Will follow up within the next month.  THN CM Care Plan Problem One     Most Recent Value  Care Plan Problem One  Knowledge deficit regarding management of chronic medical conditions as evidenced by recent hospitalization  Role Documenting the Problem One  Care Management [place] for Problem One  Active  THN Long Term Goal   Member's A1C will be decreased to </= 8 within the next 3 months  THN Long Term Goal Start Date  01/20/19  Interventions for Problem One Long Term Goal  Re-educated on management of diabetes, including exercise, diet, and medication management.  Advised of complications of uncontrolled diabetes  THN CM Short Term Goal #2   Member will report schedule for intensive behavioral therapy within the next 2 weeks   THN CM Short Term Goal #2 Start Date  02/15/19  [facility] CM Short Term Goal #2 Met Date  03/03/19     [name], RN, MSN [place] Manager [phone]

## 2019-03-04 ENCOUNTER — Ambulatory Visit: Payer: Self-pay | Admitting: Pharmacist

## 2019-03-08 ENCOUNTER — Other Ambulatory Visit: Payer: Self-pay | Admitting: Pharmacy Technician

## 2019-03-08 NOTE — Patient Outreach (Signed)
[place] [facility]) Care Management  03/08/2019  Bonnie Gonzalez [birth year] [id number]   Received patient portion(s) of patient assistance application for Proventil HFA. Prepared to mail completed application and required documents into [employer].  Will follow up with company in 14-21 business days to check status of application.  Maud Deed Chana Bode [place] Certified Pharmacy Technician [place] Management Direct [phone]

## 2019-03-09 ENCOUNTER — Ambulatory Visit: Payer: Self-pay | Admitting: Pharmacist

## 2019-03-10 ENCOUNTER — Ambulatory Visit: Payer: Self-pay | Admitting: Pharmacist

## 2019-03-14 ENCOUNTER — Ambulatory Visit: Payer: Self-pay | Admitting: Pharmacist

## 2019-03-14 ENCOUNTER — Other Ambulatory Visit: Payer: Self-pay | Admitting: Pharmacist

## 2019-03-14 ENCOUNTER — Other Ambulatory Visit: Payer: Self-pay

## 2019-03-14 NOTE — Patient Outreach (Addendum)
[facility]) Care Management  [place]   03/14/2019  Bonnie Gonzalez 06/03/1956 [id number]  Reason for referral: Medication Assistance/Management  Referral source: [facility] RN Current insurance: BCBS Commercial  PMHx includes but not limited to:  COPD, T2DM, GERD, hypothyroidism, OSA, HLD  Outreach:  Successful telephone call with [name].  HIPAA identifiers verified.  Patient agreeable to review medications telephonically.  Patient states she is still having trouble affording medications and CGM Libre ($60).  She is now on new medications: Tresiba, Novolog, Ozempic, Stiolto inhaler, CGM.  She has a Geophysical data processor.  She is currently out of work on [employer] and does not steady income available.  She is returning to work at the end of this month.  She is currently using copay card for Ozempic ($25/month), Tresiba ($5/month).  Novolog does not have a copay card.  I will mail patient a Stiolto copay card  Patient states her BGs are still in the upper 200s.  Endocrinologist just titrated doses on 03/10/19.  Patient is now on 92 units Tresiba, Novolog 10+ sliding.  Reviewed SSI and DM healthy diet. Reviewed injection technique.  Patient just started Ozempic last week and will likely start to see results in the upcoming weeks.  Patient appears motivated to continue the good work!  Objective: Lab Results  Component Value Date   CREATININE 0.83 02/11/2019   CREATININE 0.75 01/16/2019   CREATININE 0.82 01/15/2019    Lab Results  Component Value Date   HGBA1C 10.0 (H) 01/11/2019    Lipid Panel     Component Value Date/Time   CHOL 133 02/03/2017 0914   TRIG 147.0 02/03/2017 0914   HDL 28.40 (L) 02/03/2017 0914   CHOLHDL 5 02/03/2017 0914   VLDL 29.4 02/03/2017 0914   LDLCALC 75 02/03/2017 0914   LDLDIRECT 144.0 12/11/2011 0934    BP Readings from Last 3 Encounters:  02/11/19 138/67  01/16/19 139/71  01/10/19 (!) 173/67    Allergies  Allergen Reactions  .  Aspirin Anaphylaxis    Mother had to be resuscitated after ingesting this  . Codeine Hives  . Fluoxetine Hcl Itching and Other (See Comments)    "Hair itching"  . Hydrocodone-Acetaminophen Hives  . Oxycodone-Acetaminophen Hives  . Penicillins Hives    Did it involve swelling of the face/tongue/throat, SOB, or low BP? Yes-hives Did it involve sudden or severe rash/hives, skin peeling, or any reaction on the inside of your mouth or nose? Unk Did you need to seek medical attention at a hospital or doctor's office? Yes When did it last happen? Was in her 10's If all above answers are "NO", may proceed with cephalosporin use.     Medications Reviewed Today    Reviewed by Belva Agee, CPhT (Pharmacy Technician) on 02/11/19 at [place] List Status: Complete  Medication Order Taking? Sig Documenting Provider Last Dose Status Informant  albuterol (PROVENTIL HFA;VENTOLIN HFA) 108 (90 Base) MCG/ACT inhaler [id number] Yes Inhale 2 puffs into the lungs every 4 (four) hours as needed for wheezing or shortness of breath. Laurey Morale, MD Past Month Unknown time Active Self  ALPRAZolam Duanne Moron) 0.5 MG tablet [id number] Yes TAKE 1 TABLET (0.5 MG TOTAL) BY MOUTH 2 (TWO) TIMES DAILY AS NEEDED.  Patient taking differently:  Take 0.25 mg by mouth See admin instructions. Take every night and daily as needed for anxiety   Laurey Morale, MD Past Week Unknown time Active Self  Med Note (Lometa Riggin D   Tue Feb 01, 2019  9:51 AM) Weaning off  atorvastatin (LIPITOR) 40 MG tablet [id number] Yes Take 1 tablet (40 mg total) by mouth daily.  Patient taking differently:  Take 80 mg by mouth at bedtime.    Laurey Morale, MD Past Week Unknown time Active Self  benzonatate (TESSALON) 200 MG capsule [id number] Yes Take 1 capsule (200 mg total) by mouth 2 (two) times daily as needed for cough. Laurey Morale, MD unk Active Self  donepezil (ARICEPT) 10 MG tablet [id number] Yes TAKE 1 TABLET BY MOUTH AT  BEDTIME  Patient taking differently:  Take 10 mg by mouth at bedtime.    Laurey Morale, MD Past Week Unknown time Active Self  DULoxetine (CYMBALTA) 60 MG capsule [id number] Yes Take 1 capsule (60 mg total) by mouth daily.  Patient taking differently:  Take 60 mg by mouth at bedtime.    Laurey Morale, MD Past Week Unknown time Active Self  esomeprazole (NEXIUM) 40 MG capsule [id number] Yes Take 30- 60 min before your first and last meals of the day  Patient taking differently:  Take 40 mg by mouth daily before breakfast.    Tanda Rockers, MD 02/10/2019 Unknown time Active Self        Discontinued 02/11/19 0224 (Error)   furosemide (LASIX) 40 MG tablet [id number] Yes TAKE 1 TABLET BY MOUTH ONCE DAILY  Patient taking differently:  Take 40 mg by mouth daily.    Laurey Morale, MD 02/10/2019 Unknown time Active Self  glucose 4 GM chewable tablet [id number] Yes Chew 1 tablet (4 g total) by mouth as needed for low blood sugar (< 70). Dessa Phi, DO unk Active Self  halobetasol (ULTRAVATE) 0.05 % cream [id number] Yes Apply topically 2 (two) times daily as needed.  Patient taking differently:  Apply 1 application topically 2 (two) times daily as needed (for itching).    Laurey Morale, MD unk Active Self  hydrOXYzine (VISTARIL) 25 MG capsule [id number] Yes TAKE 1 CAPSULE BY MOUTH THREE TIMES A DAY AS NEEDED  Patient taking differently:  Take 25 mg by mouth See admin instructions. Take 25 mg by mouth 2 times a day and an additional 25 mg as needed for itching   Laurey Morale, MD 02/10/2019 Unknown time Active Self  Hyprom-Naphaz-Polysorb-Zn Sulf (CLEAR EYES COMPLETE) SOLN [id number] Yes Place 1-2 drops into both eyes as needed (for itching). [provider] Past Month Unknown time Active Self  insulin aspart (NOVOLOG) 100 UNIT/ML injection [id number] Yes Use sliding scale provided at time of discharge, administer 3 times daily with meals  Patient taking differently:  Inject 8-14 Units into the  skin 3 (three) times daily with meals. Use sliding scale provided at time of discharge, administer 3 times daily with meals   Dessa Phi, DO 02/10/2019 Unknown time Active Self  Insulin Degludec 200 UNIT/ML SOPN [id number] Yes Inject 80 Units into the skin every morning. [provider] 02/10/2019 Unknown time Active Self  insulin NPH Human (NOVOLIN N) 100 UNIT/ML injection [id number] No 50 units every morning, 50 units every evening  Patient not taking:  Reported on 02/11/2019   Dessa Phi, DO Not Taking Unknown time Consider Medication Status and Discontinue (Change in therapy) Self  levothyroxine (SYNTHROID, LEVOTHROID) 175 MCG tablet [id number] Yes TAKE 1 TABLET BY MOUTH ONCE DAILY BEFORE BREAKFAST  Patient taking differently:  Take 175 mcg by mouth daily before breakfast.    [name]  A, MD 02/10/2019 Unknown time Active Self  meclizine (ANTIVERT) 50 MG tablet [id number] Yes Take 0.5 tablets (25 mg total) by mouth 3 (three) times daily as needed.  Patient taking differently:  Take 25 mg by mouth 3 (three) times daily as needed for dizziness.    Al Corpus, PA-C unk Active Self           Med Note (Hibba Schram, Royce Macadamia   Tue Feb 01, 2019  9:52 AM) Rarely uses   methocarbamol (ROBAXIN) 750 MG tablet [id number] Yes Take 1 tablet (750 mg total) by mouth every 6 (six) hours as needed for muscle spasms. Laurey Morale, MD Past Week Unknown time Active Self  naproxen (NAPROSYN) 500 MG tablet [id number] Yes Take 500 mg by mouth 2 (two) times daily.  [provider] 02/10/2019 am Active Self  predniSONE (DELTASONE) 10 MG tablet [id number] No Take 4 tabs for 3 days, then 3 tabs for 3 days, then 2 tabs for 3 days, then 1 tab for 3 days, then 1/2 tab for 4 days.  Patient not taking:  Reported on 02/11/2019   Dessa Phi, DO Not Taking Unknown time Active Self  Tiotropium Bromide-Olodaterol (STIOLTO RESPIMAT) 2.5-2.5 MCG/ACT AERS [id number] Yes Inhale 2.5 mcg into the lungs daily.  [provider] Past Week Unknown time Active Self  traZODone (DESYREL) 50 MG tablet [id number] Yes Take 25 mg by mouth at bedtime.  [provider] Past Week Unknown time Active Self  umeclidinium-vilanterol (ANORO ELLIPTA) 62.5-25 MCG/INH AEPB [id number] No Inhale 1 puff into the lungs daily.  Patient not taking:  Reported on 01/20/2019   Tanda Rockers, MD Not Taking Unknown time Consider Medication Status and Discontinue (Change in therapy) Self          Medication Assistance Findings:  -Patient has commercial insurance-->copay card for Stiolto   Plan: . I will mail patient copay card for Stiolto and charity care application . Routine f/u with patient next month (DM, COPD)   [name], PharmD, [place]  [phone]

## 2019-03-21 ENCOUNTER — Other Ambulatory Visit: Payer: Self-pay | Admitting: Pharmacy Technician

## 2019-03-21 NOTE — Patient Outreach (Signed)
[facility] [facility]) Care Management  03/21/2019  Raziah I Pineda 1956-05-12 [id number]    Follow up call placed to [employer] regarding patient assistance application(s) for Proventil HFA , nathan confirms application has been recieved.Hyman Bower form mailed out to patient on 4/15.   Unsuccessful call #1 placed to patient regarding patient assistance receipt of attestation form from [employer] for Proventil HFA, HIPAA compliant voicemail left.   Follow up:  Will make 2nd call attempt in 2-3 business days if call has not been returned.  Maud Deed Chana Bode [place] Certified Pharmacy Technician [place] Management Direct [phone]

## 2019-03-29 DIAGNOSIS — F5101 Primary insomnia: Secondary | ICD-10-CM | POA: Insufficient documentation

## 2019-03-29 DIAGNOSIS — F5104 Psychophysiologic insomnia: Secondary | ICD-10-CM

## 2019-03-29 HISTORY — DX: Psychophysiologic insomnia: F51.04

## 2019-04-04 ENCOUNTER — Other Ambulatory Visit: Payer: Self-pay | Admitting: *Deleted

## 2019-04-04 NOTE — Patient Outreach (Signed)
[place] [facility]) Care Management  04/04/2019  Bonnie Gonzalez 1955/12/19 [id number]   Call placed to member to follow up on COPD management, no answer.  HIPAA compliant voice message left.  Will follow up within the next 3-4 business days.  [name], [place], MSN [place] [phone]

## 2019-04-08 ENCOUNTER — Other Ambulatory Visit: Payer: Self-pay | Admitting: *Deleted

## 2019-04-08 NOTE — Patient Outreach (Signed)
[place] [facility]) Care Management  04/08/2019  Bonnie Gonzalez 04-09-1956 [id number]   Second attempt made to contact member to follow up on COPD management, no answer.  HIPAA compliant voice message left, unsuccessful outreach letter sent.  Will follow up within the next 3-4 business days.  [name], [place], MSN [place] [phone]

## 2019-04-11 ENCOUNTER — Other Ambulatory Visit: Payer: Self-pay | Admitting: Pharmacy Technician

## 2019-04-11 NOTE — Patient Outreach (Signed)
[place] [facility]) Care Management  04/11/2019  Bonnie Gonzalez 12/06/1955 [id number]    Unsuccessful call #2 placed to patient regarding patient assistance receipt of attestation form from [employer] for Proventil HFA, HIPAA compliant voicemail left.   Follow up:  Will make 3rd call attempt in 2-3 business days if call has not been returned.  Maud Deed Chana Bode [place] Certified Pharmacy Technician [place] Management Direct Dial:[phone]

## 2019-04-14 ENCOUNTER — Other Ambulatory Visit: Payer: Self-pay | Admitting: *Deleted

## 2019-04-14 NOTE — Patient Outreach (Signed)
[place] [facility]) Care Management  04/14/2019  Bonnie Gonzalez Apr 29, 1956 [id number]   Call placed to member to follow up on current medical management of health conditions.  She report she is doing "great."  State she went back to work this week, hoping to be back to full time hours next week.  She is using her portable O2 tank when out, denies shortness of breath or chest discomfort.  Report blood sugars have decreased from 300-400s down to 100-200s.  Will have updated A1C during next visit with either primary MD on 5/28 or with endocrinologist on 5/15.   Assessed the need for ongoing complex case management, member denies any needs.  Agrees to ongoing involvement with health coach.  Will place referral for disease management and notify primary MD of transition.    THN CM Care Plan Problem One     Most Recent Value  Care Plan Problem One  Knowledge deficit regarding management of chronic medical conditions as evidenced by recent hospitalization  Role Documenting the Problem One  Care Management [place] for Problem One  Active  THN Long Term Goal   Member's A1C will be decreased to </= 8 within the next 3 months  THN Long Term Goal Start Date  01/20/19  Interventions for Problem One Long Term Goal  Referral placed to health coach for ongoing management of chronic medical conditions.       [name], [place], MSN [place] [phone]

## 2019-04-19 ENCOUNTER — Other Ambulatory Visit: Payer: Self-pay | Admitting: Pharmacy Technician

## 2019-04-19 NOTE — Patient Outreach (Signed)
[facility]) Care Management  04/19/2019  Alaura I Valentino December 10, 1955 [id number]   Outreached patient multiple times with messages left regarding [employer] attestation form for Proventil HFA.  Made multiple follow up calls to [employer] to verify if attestation form had been mailed back in.  Will route note to [place] for case closure.  Maud Deed Chana Bode [place] Certified Pharmacy Technician [place] Management Direct Dial:[phone]

## 2019-04-25 ENCOUNTER — Other Ambulatory Visit: Payer: Self-pay | Admitting: *Deleted

## 2019-04-29 ENCOUNTER — Ambulatory Visit: Payer: Self-pay | Admitting: Pharmacist

## 2019-05-05 ENCOUNTER — Ambulatory Visit: Payer: Self-pay | Admitting: Pharmacist

## 2019-05-05 ENCOUNTER — Other Ambulatory Visit: Payer: Self-pay | Admitting: Pharmacist

## 2019-05-10 NOTE — Patient Outreach (Signed)
[place] [facility]) Care Management  [place]   05/05/2019  Bonnie Gonzalez 04/23/1956 [id number]  Reason for referral: Medication Assistance/Management  Referral source: [facility] RN Current insurance: BCBS Commercial  PMHx includes but not limited to:  COPD, T2DM, GERD, hypothyroidism, OSA, HLD  Outreach:  Successful telephone call with [name].  HIPAA identifiers verified.  Patient agreeable to review medications telephonically.  Patient states she is still having trouble affording medications and CGM Libre ($60).  She is now on new medications: Tresiba, Novolog, Ozempic, Stiolto inhaler, CGM.  She has a Geophysical data processor.  She is now back to work and reports doing well.  She carries her portable oxygen with her to work.    Patient states her BGs are improving and now in the 100s- to lower 200s.  This is an improvement from the upper 200s ~39months ago.   still in the upper 200s.  Endocrinologist just titrated doses on 04/15/19.  Patient is now on:  -Continue Ozempic 0.5 mg ONCE weekly  -Continue Tresiba 92 units in the morning  -Continue Novolog 10 units three times daily with meals plus correction (can give correction at night for hyperglycemia)  121-150 give 2 units 151-200 give 4 units 201-250 give 6 units 251-300 give 8 units 301-350 give 10 units 351-400 give 12 units  If night time snack is >15 g of carb can give Novolog 10 units Monitor BG before meals and at bedtime Continue to use [employer] or call endocrine if BG is consistently less than 70 or greater than 300 Continue to monitor carb intake  Continue to exercise (chair exercise)   Reviewed SSI and DM healthy diet. Reviewed injection technique.  Patient tolerating Ozempic.  Patient appears motivated to continue the good work!  PLAN: -I will route letter requesting RX for [place] approval -I will f/u with patient next month  [name], PharmD, [place]  [phone]

## 2019-05-13 ENCOUNTER — Other Ambulatory Visit: Payer: Self-pay | Admitting: *Deleted

## 2019-05-13 NOTE — Patient Outreach (Signed)
[place]) Care Management  05/13/2019  Bonnie Gonzalez 1956/04/22 [id number]   RN Health Coach attempted follow up outreach call to patient.  Patient was unavailable. HIPPA compliance voicemail message left with return callback number.  Plan: RN will call patient again within 30  days.  [place] Care Management [phone]

## 2019-05-31 ENCOUNTER — Ambulatory Visit: Payer: Self-pay | Admitting: Pharmacist

## 2019-06-01 DIAGNOSIS — M8589 Other specified disorders of bone density and structure, multiple sites: Secondary | ICD-10-CM

## 2019-06-01 HISTORY — DX: Other specified disorders of bone density and structure, multiple sites: M85.89

## 2019-06-06 ENCOUNTER — Ambulatory Visit: Payer: Self-pay | Admitting: Pharmacist

## 2019-06-13 ENCOUNTER — Other Ambulatory Visit: Payer: Self-pay | Admitting: Pharmacist

## 2019-06-13 ENCOUNTER — Ambulatory Visit: Payer: Self-pay | Admitting: Pharmacist

## 2019-07-15 ENCOUNTER — Other Ambulatory Visit: Payer: Self-pay | Admitting: *Deleted

## 2019-07-15 NOTE — Patient Outreach (Signed)
[place] [facility]) Care Management  07/15/2019  Bonnie Gonzalez February 28, 1956 [id number]   RN Health Coach attempted follow up outreach call to patient.  Patient was unavailable. HIPPA compliance voicemail message left with return callback number.  Plan: RN will call patient again within 30 days. RN sent unsuccessful outreach letter  [place] Management [phone]

## 2019-08-19 ENCOUNTER — Ambulatory Visit: Payer: Self-pay | Admitting: *Deleted

## 2019-09-06 ENCOUNTER — Other Ambulatory Visit: Payer: Self-pay | Admitting: *Deleted

## 2019-09-06 NOTE — Patient Outreach (Signed)
[place] [facility]) Care Management  09/06/2019  Bonnie Gonzalez 1956-01-29 [id number]   RN Health Coach attempted follow up outreach call to patient.  Patient was unavailable. HIPPA compliance voicemail message left with return callback number.  Plan: RN will call patient again within 30 days.  [place] Care Management [phone]

## 2019-09-24 ENCOUNTER — Other Ambulatory Visit: Payer: Self-pay | Admitting: Pharmacist

## 2019-09-24 NOTE — Patient Outreach (Signed)
[place] [facility]) Care Management [place]  09/24/2019  Tatia I [place] 07-Aug-1956 [id number]  Reason for referral: medication management/assistance   [facility] pharmacy case is being closed due to the following reasons:  -Goals of care have been met. -I have provided my contact information if patient or family needs to reach out to me in the future.    [name], PharmD, [place]  [phone]

## 2019-10-03 ENCOUNTER — Other Ambulatory Visit: Payer: Self-pay | Admitting: Family Medicine

## 2019-10-06 ENCOUNTER — Other Ambulatory Visit: Payer: Self-pay | Admitting: *Deleted

## 2019-10-06 ENCOUNTER — Ambulatory Visit: Payer: Self-pay | Admitting: *Deleted

## 2019-10-06 NOTE — Patient Outreach (Addendum)
   [place] [facility]) Care Management  10/06/2019  Monica I [place] 1956-10-01 [id number]   Multiple attempts to establish contact with patient without success. No response from letter mailed to patient. Case is being closed at this time.  Plan: RN Health Coach will close case based on inability to establish contact with patient. Closure letter sent to patient and PCP  [place] Management [phone]

## 2019-12-21 DIAGNOSIS — L508 Other urticaria: Secondary | ICD-10-CM | POA: Insufficient documentation

## 2019-12-21 HISTORY — DX: Other urticaria: L50.8

## 2020-07-17 DIAGNOSIS — F329 Major depressive disorder, single episode, unspecified: Secondary | ICD-10-CM | POA: Insufficient documentation

## 2020-07-17 HISTORY — DX: Major depressive disorder, single episode, unspecified: F32.9

## 2020-09-15 DIAGNOSIS — E559 Vitamin D deficiency, unspecified: Secondary | ICD-10-CM | POA: Insufficient documentation

## 2020-09-15 HISTORY — DX: Vitamin D deficiency, unspecified: E55.9

## 2021-03-29 ENCOUNTER — Telehealth: Payer: Self-pay | Admitting: Family Medicine

## 2021-03-29 NOTE — Telephone Encounter (Signed)
Pt call and stated she wanted me to ask dr.Fry will he take her back ask her PCP I let her know that his panel was full but she wanted me to ask again and would like a call back.

## 2021-04-01 NOTE — Telephone Encounter (Signed)
Yes I would be happy to see her again  

## 2021-04-01 NOTE — Telephone Encounter (Signed)
Called spoke with patient.  Will call schedule visit at her convenience.

## 2021-04-04 ENCOUNTER — Other Ambulatory Visit: Payer: Self-pay

## 2021-04-05 ENCOUNTER — Encounter: Payer: Self-pay | Admitting: Family Medicine

## 2021-04-05 ENCOUNTER — Ambulatory Visit (INDEPENDENT_AMBULATORY_CARE_PROVIDER_SITE_OTHER): Payer: PPO | Admitting: Family Medicine

## 2021-04-05 VITALS — BP 140/80 | HR 96 | Temp 98.2°F | Ht 60.0 in | Wt 268.4 lb

## 2021-04-05 DIAGNOSIS — R413 Other amnesia: Secondary | ICD-10-CM | POA: Diagnosis not present

## 2021-04-05 DIAGNOSIS — R251 Tremor, unspecified: Secondary | ICD-10-CM | POA: Diagnosis not present

## 2021-04-05 DIAGNOSIS — Z0000 Encounter for general adult medical examination without abnormal findings: Secondary | ICD-10-CM | POA: Diagnosis not present

## 2021-04-05 NOTE — Progress Notes (Signed)
   Subjective:    Patient ID: Bonnie Gonzalez, female    DOB: 06-16-1956, 65 y.o.   MRN: [id number]  HPI Here to re-establish with [name] and for a well exam. She was last seen here over 3 years ago. Due to an insurance change she has been seeing Dr. Doreatha Lew for primary care, but now her insurance allows her to come see [name] again. She also sees a number of specialists. She sees Jacolyn Reedy FNP at [facility] for her diabetes and her hypothyroidism. She sees Dr. Worthy Keeler at [facility] for COPD and sleep apnea. She sees Dr. Gambrills Desanctis for orthopedic issues. She sees Dr. Nelva Bush for GYN care. She wears [place] oxygen 24 hours a day, and currently her flow is set to 2 liters. She uses a CPAP machine at night. Despite all her health issues, she still works full time (on a computer at a desk). She is past due for a coloscopy. She also complains of a tremor in the left hand which started about 6 months ago, along with memory issues that started several years ago. I reviewed the lab work she has had done in the past few months, and this included an A1c of 9.8 in March, and a normal creatinine of 0.66. She has not had a lipid panel drawn for several years however.    Review of Systems  Constitutional: Negative.   Respiratory: Positive for shortness of breath. Negative for cough and wheezing.   Cardiovascular: Positive for leg swelling. Negative for chest pain and palpitations.  Gastrointestinal: Negative.   Genitourinary: Negative.   Neurological: Positive for tremors. Negative for weakness and numbness.       Objective:   Physical Exam Constitutional:      Appearance: She is obese.     Comments: She is wearing [place] oxygen and she walks with a cane   Cardiovascular:     Rate and Rhythm: Normal rate and regular rhythm.     Pulses: Normal pulses.     Heart sounds: Normal heart sounds.  Pulmonary:     Effort: Pulmonary effort is normal. No respiratory distress.      Breath sounds: No wheezing or rales.     Comments: Dry crackles are present at both posterior lung bases  Abdominal:     General: Abdomen is flat. Bowel sounds are normal. There is no distension.     Palpations: Abdomen is soft. There is no mass.     Tenderness: There is no abdominal tenderness. There is no guarding or rebound.     Hernia: No hernia is present.  Musculoskeletal:     Comments: 1+ edema in both ankles   Lymphadenopathy:     Cervical: No cervical adenopathy.  Neurological:     Mental Status: She is alert and oriented to person, place, and time.     Motor: No weakness.     Coordination: Coordination normal.     Comments: There is a fine resting tremor in the left hand            Assessment & Plan:  Well exam and re-establishing visit for this patient. Her COPD is stable. Her type 2 diabetes is not well controlled and she will follow up with Endocrine. Her HTN is stable. We will set a fasting lipid panel sometime soon. Set up a colonoscopy. Refer to Neurology for the memory issues and the tremor.  [name], MD

## 2021-04-08 ENCOUNTER — Telehealth: Payer: Self-pay | Admitting: Family Medicine

## 2021-04-08 NOTE — Progress Notes (Signed)
Assessment/Plan:   1.  Tremor.  -Could be a consequence of her medications for her lungs, but certainly could have a combination of that and essential tremor.  She believes that her tremor started before she started on medicines for her lungs, but also has a family history of tremor.  We talked about various medications for tremor (also told her we would not try propranolol because of lung function), but ultimately she is very clear that she really did not want more medication.  She is on plenty of medication, and is not disabled by tremor.  Reassurance was provided that I saw no evidence of a neurodegenerative process such as Parkinson's disease.  2.  Memory change  -Patient already on donepezil by primary care  -Examined by Dr. 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  I do not see any evidence of a neurodegenerative process today, but patient was somewhat worried.  She and I discussed neurocognitive testing.  She would like to proceed with that.  Referral made.  Will see her back if any neurodegen issue found.   Subjective:   Bonnie Gonzalez was seen in consultation in the movement disorder clinic at the request of Laurey Morale, MD.  The evaluation is for tremor and memory change.  Patient saw my partner, Dr. Posey Pronto, back in February, 2015 for memory change.  At that point time, it was felt that patient likely had pseudodementia from underlying stress and depression.  MRI brain was completed and demonstrated minimal white matter disease.  Dr. Posey Pronto recommended referring to behavioral therapist and stated that they could consider neurocognitive testing in the future.    Living situation:  Pt lives alone.  The patient does do the finances in the home.  She has no issues with that.  The patient does drive.  she has gotten lost 2 times in the last months while driving (one time was just coming home from work and was able to talk herself how  to get home).  The patient does some cooking but not a lont.  There is no difficulty remembering common recipes.    ADL's:  The patient is able to perform her own ADL's (some difficulty due to back issues - sits to do some of it). The patient self medicates. She puts them in a pillbox herself.    Behavior:   There have been no behavioral changes over the years.    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  She has trouble with remembering names.    Meds: Patient on donepezil for memory  Tremor started 1 year ago and involves the left hand>R hand.  It got worse over the last 3 months but "it has not been like that for the last few weeks."  She has some head tremor as well and states that it started in the head prior to the hands.  It is in the "yes" direction.   Tremor is most noticeable when using the hands.   There is a family hx of tremor in father and older brother.    Affected by caffeine:  Doesn't think so (3cups of coffee/day at least) Affected by alcohol:  Doesn't drink Affected by stress:  No. Affected by fatigue:  Yes.   Spills soup if on spoon:  May or may not depending on day Spills glass of liquid if full:  May or may  not depending on day Affects ADL's (tying shoes, brushing teeth, etc):  No.  Current/Previously tried tremor medications: On Xanax  Current medications that may exacerbate tremor:  Albuterol (uses rarely - not even 1 time per week), Spiriva Voice: no change Postural symptoms:  Yes.   , due to knees/back/ankle fx  Falls?  Yes.   , last fall 2 weeks ago and fell backward on the stairs - fell on purse and cushioned the fall; no other falls Bradykinesia symptoms: difficulty getting out of a chair Loss of smell:  Yes.   but its "always been that way" Loss of taste:  No. Urinary Incontinence:  No. (occ stress incontinence) Depression:  Yes.  , "some"     Outside reports reviewed: historical medical  records, lab reports and referral letter/letters.  Allergies  Allergen Reactions  . Aspirin Anaphylaxis    Mother had to be resuscitated after ingesting this  . Codeine Hives  . Fluoxetine Hcl Itching and Other (See Comments)    "Hair itching"  . Hydrocodone-Acetaminophen Hives  . Oxycodone-Acetaminophen Hives  . Penicillins Hives    Did it involve swelling of the face/tongue/throat, SOB, or low BP? Yes-hives Did it involve sudden or severe rash/hives, skin peeling, or any reaction on the inside of your mouth or nose? Unk Did you need to seek medical attention at a hospital or doctor's office? Yes When did it last happen? Was in her 64's If all above answers are "NO", may proceed with cephalosporin use.     Current Outpatient Medications  Medication Instructions  . albuterol (PROVENTIL HFA;VENTOLIN HFA) 108 (90 Base) MCG/ACT inhaler 2 puffs, Inhalation, Every 4 hours PRN  . ALPRAZolam (XANAX) 0.5 mg, Oral, 2 times daily PRN  . atorvastatin (LIPITOR) 40 mg, Oral, Daily  . azelastine (ASTELIN) 0.1 % nasal spray Place into the nose.  . benzonatate (TESSALON) 200 mg, Oral, 2 times daily PRN  . Cholecalciferol 50 MCG (2000 UT) CAPS 1 capsule, Oral, Daily  . Continuous Blood Gluc Sensor (FREESTYLE LIBRE 14 DAY SENSOR) MISC Does not apply  . cyanocobalamin 1000 MCG tablet 1 tablet, Oral, Daily  . dicyclomine (BENTYL) 20 mg, Oral, 2 times daily  . donepezil (ARICEPT) 10 MG tablet TAKE 1 TABLET BY MOUTH AT BEDTIME  . DULoxetine (CYMBALTA) 60 mg, Oral, Daily  . esomeprazole (NEXIUM) 40 MG capsule Take 30- 60 min before your first and last meals of the day  . eszopiclone (LUNESTA) 2 mg, Oral,  Once  . fluconazole (DIFLUCAN) 150 MG tablet Take 1 tablet po x 1 and repeat in 3 days  . Fluticasone-Umeclidin-Vilant (TRELEGY ELLIPTA) 100-62.5-25 MCG/INH AEPB Inhalation, Inhale 1 puff into lungs daily for 30 days.  . furosemide (LASIX) 40 MG tablet TAKE 1 TABLET BY MOUTH ONCE DAILY  . glucose 4  g, Oral, As needed  . halobetasol (ULTRAVATE) 0.05 % cream Topical, 2 times daily PRN  . hydrocortisone 2.5 % ointment Topical  . hydrOXYzine (VISTARIL) 25 MG capsule TAKE 1 CAPSULE BY MOUTH THREE TIMES A DAY AS NEEDED  . Hyprom-Naphaz-Polysorb-Zn Sulf (CLEAR EYES COMPLETE) SOLN 1-2 drops, As needed  . insulin aspart (NOVOLOG) 100 UNIT/ML injection Use sliding scale provided at time of discharge, administer 3 times daily with meals  . insulin degludec (TRESIBA) 80 Units, Subcutaneous, BH-each morning, Inject 80 units into the skin in the morning and at bedtime.  . Insulin Pen Needle (B-D UF III MINI PEN NEEDLES) 31G X 5 MM MISC Use to administer insulin five times a  day  . levothyroxine (SYNTHROID) 175 MCG tablet TAKE 1 TABLET BY MOUTH ONCE DAILY BEFORE BREAKFAST  . meclizine (ANTIVERT) 25 mg, Oral, 3 times daily PRN  . methocarbamol (ROBAXIN) 750 mg, Oral, Every 6 hours PRN  . naproxen (NAPROSYN) 500 mg, Oral, 2 times daily  . nystatin (MYCOSTATIN/NYSTOP) powder Apply thin layer to rash on lower abdomen BID until rash resolves then once a day  . ondansetron (ZOFRAN) 4 mg, Oral, Every 8 hours PRN  . Probiotic Product (PROBIOTIC-10 PO) Oral  . Semaglutide,0.25 or 0.5MG /DOS, 2 MG/1.5ML SOPN 1 Dose, Subcutaneous, Weekly, Inject 0.4 mL's (0.5mg  total) into the skin once a week.  . Tiotropium Bromide-Olodaterol 2.5-2.5 MCG/ACT AERS 2.5 mcg, Daily  . traZODone (DESYREL) 25 mg, Daily at bedtime     Objective:   VITALS:   Vitals:   04/11/21 0849  BP: (!) 158/78  Pulse: 94  SpO2: 91%  Weight: 268 lb (121.6 kg)  Height: 5' (1.524 m)   Gen:  Appears stated age and in NAD. HEENT:  Normocephalic, atraumatic. The mucous membranes are moist. The superficial temporal arteries are without ropiness or tenderness. Cardiovascular: Regular rate and rhythm. Lungs: Clear to auscultation bilaterally.  Wearing O2 (and cpap at night) Neck: There are no carotid bruits noted  bilaterally.  NEUROLOGICAL:  Orientation:  The patient is alert and oriented x 3.   Cranial nerves: There is good facial symmetry. Extraocular muscles are intact and visual fields are full to confrontational testing. Speech is fluent and clear. Soft palate rises symmetrically and there is no tongue deviation. Hearing is intact to conversational tone. Tone: Tone is good throughout. Sensation: Sensation is intact to light touch touch throughout (facial, trunk, extremities). Vibration is intact at the bilateral big toe. There is no extinction with double simultaneous stimulation. There is no sensory dermatomal level identified. Coordination:  The patient has no dysdiadichokinesia or dysmetria. Motor: Strength is 5/5 in the bilateral upper and lower extremities.  Shoulder shrug is equal bilaterally.  There is no pronator drift.  There are no fasciculations noted. DTR's: Deep tendon reflexes are 2/4 at the bilateral biceps, triceps, brachioradialis, patella and achilles.  Plantar responses are downgoing bilaterally. Gait and Station: The patient is able to ambulate without difficulty. The patient is able to heel toe walk without any difficulty. The patient is able to ambulate in a tandem fashion. The patient is able to stand in the Romberg position.   MOVEMENT EXAM: Tremor:  There is no rest tremor.  No postural tremor.  Very little intention tremor.  She does spill some water when asked to pour it from 1 glass to another, but she was seated because she was out of breath and I think she spilled it more because of having to lean over the counter to reach the sink.  I did not see any head tremor.  Very little tremor with Archimedes spirals.  I have reviewed and interpreted the following labs independently   Chemistry      Component Value Date/Time   NA 137 02/11/2019 0216   K 3.9 02/11/2019 0216   CL 98 02/11/2019 0216   CO2 27 02/11/2019 0216   BUN 25 (H) 02/11/2019 0216   CREATININE 0.83  02/11/2019 0216      Component Value Date/Time   CALCIUM 9.9 02/11/2019 0216   ALKPHOS 85 02/11/2019 0216   AST 29 02/11/2019 0216   ALT 36 02/11/2019 0216   BILITOT 0.5 02/11/2019 0216      Lab Results  Component Value Date   WBC 9.1 02/11/2019   HGB 12.8 02/11/2019   HCT 44.2 02/11/2019   MCV 84.0 02/11/2019   PLT 225 02/11/2019   Lab Results  Component Value Date   TSH 1.08 08/31/2018   Lab Results  Component Value Date   VITAMINB12 918 (H) 01/03/2014      Total time spent on today's visit was 45 minutes, including both face-to-face time and nonface-to-face time.  Time included that spent on review of records (prior notes available to me/labs/imaging if pertinent), discussing treatment and goals, answering patient's questions and coordinating care.  CC:  Laurey Morale, MD

## 2021-04-08 NOTE — Telephone Encounter (Signed)
levothyroxine (SYNTHROID, LEVOTHROID) [place] tablet  [place], [place] Phone:  [phone]  Fax:  [phone]

## 2021-04-09 DIAGNOSIS — J984 Other disorders of lung: Secondary | ICD-10-CM | POA: Diagnosis not present

## 2021-04-09 DIAGNOSIS — Z87891 Personal history of nicotine dependence: Secondary | ICD-10-CM | POA: Diagnosis not present

## 2021-04-09 DIAGNOSIS — J432 Centrilobular emphysema: Secondary | ICD-10-CM | POA: Diagnosis not present

## 2021-04-10 ENCOUNTER — Other Ambulatory Visit (INDEPENDENT_AMBULATORY_CARE_PROVIDER_SITE_OTHER): Payer: PPO

## 2021-04-10 ENCOUNTER — Other Ambulatory Visit: Payer: Self-pay

## 2021-04-10 DIAGNOSIS — Z0000 Encounter for general adult medical examination without abnormal findings: Secondary | ICD-10-CM | POA: Diagnosis not present

## 2021-04-10 LAB — LIPID PANEL
Cholesterol: 254 mg/dL — ABNORMAL HIGH (ref 0–200)
HDL: 43.9 mg/dL (ref >39.00)
NonHDL: 209.92
Total CHOL/HDL Ratio: 6
Triglycerides: 215 mg/dL — ABNORMAL HIGH (ref 0.0–149.0)
VLDL: 43 mg/dL — ABNORMAL HIGH (ref 0.0–40.0)

## 2021-04-10 LAB — LDL CHOLESTEROL, DIRECT: Direct LDL: 176 mg/dL

## 2021-04-10 NOTE — Telephone Encounter (Signed)
Please refill for one year  

## 2021-04-10 NOTE — Telephone Encounter (Signed)
Last office visit- 04/05/2021 Last refill- 04/05/2018  Can this patient receive a refill?

## 2021-04-11 ENCOUNTER — Encounter: Payer: Self-pay | Admitting: Neurology

## 2021-04-11 ENCOUNTER — Other Ambulatory Visit: Payer: Self-pay

## 2021-04-11 ENCOUNTER — Ambulatory Visit: Payer: PPO | Admitting: Neurology

## 2021-04-11 VITALS — BP 158/78 | HR 94 | Ht 60.0 in | Wt 268.0 lb

## 2021-04-11 DIAGNOSIS — R413 Other amnesia: Secondary | ICD-10-CM

## 2021-04-11 MED ORDER — LEVOTHYROXINE SODIUM 175 MCG PO TABS: ORAL_TABLET | ORAL | 3 refills | Status: DC

## 2021-04-11 NOTE — Patient Instructions (Signed)
You have been referred for a neurocognitive evaluation (i.e., evaluation of memory and thinking abilities). Please bring someone with you to this appointment if possible, as it is helpful for the neuropsychologist to hear from both you and another adult who knows you well. Please bring eyeglasses and hearing aids if you wear them.    The evaluation will take approximately 3 hours and has two parts:   . The first part is a clinical interview with the neuropsychologist, Dr. Merz or Dr. Stewart. During the interview, the neuropsychologist will speak with you and the individual you brought to the appointment.    . The second part of the evaluation is testing with the doctor's technician, aka psychometrician, Dana or Kim. During the testing, the technician will ask you to remember different types of material, solve problems, and answer some questionnaires. Your family member will not be present for this portion of the evaluation.   Please note: We have to reserve several hours of the neuropsychologist's time and the psychometrician's time for your evaluation appointment. As such, there is a No-Show fee of $100. If you are unable to attend any of your appointments, please contact our office as soon as possible to reschedule.   

## 2021-04-11 NOTE — Telephone Encounter (Signed)
Refill for one year for Levothyroxine has been sent to [facility] on [address].

## 2021-04-11 NOTE — Telephone Encounter (Signed)
Noted  

## 2021-04-26 ENCOUNTER — Other Ambulatory Visit: Payer: Self-pay

## 2021-04-26 MED ORDER — ATORVASTATIN CALCIUM 40 MG PO TABS: 40.0000 mg | ORAL_TABLET | Freq: Every day | ORAL | 3 refills | Status: DC

## 2021-05-02 DIAGNOSIS — Z9989 Dependence on other enabling machines and devices: Secondary | ICD-10-CM | POA: Diagnosis not present

## 2021-05-02 DIAGNOSIS — I5032 Chronic diastolic (congestive) heart failure: Secondary | ICD-10-CM | POA: Diagnosis not present

## 2021-05-02 DIAGNOSIS — J9611 Chronic respiratory failure with hypoxia: Secondary | ICD-10-CM | POA: Diagnosis not present

## 2021-05-02 DIAGNOSIS — J432 Centrilobular emphysema: Secondary | ICD-10-CM | POA: Diagnosis not present

## 2021-05-02 DIAGNOSIS — J8410 Pulmonary fibrosis, unspecified: Secondary | ICD-10-CM | POA: Diagnosis not present

## 2021-05-02 DIAGNOSIS — G4733 Obstructive sleep apnea (adult) (pediatric): Secondary | ICD-10-CM | POA: Diagnosis not present

## 2021-05-02 DIAGNOSIS — Z9981 Dependence on supplemental oxygen: Secondary | ICD-10-CM | POA: Diagnosis not present

## 2021-05-07 ENCOUNTER — Other Ambulatory Visit: Payer: Self-pay

## 2021-05-07 DIAGNOSIS — E8881 Metabolic syndrome: Secondary | ICD-10-CM | POA: Diagnosis not present

## 2021-05-07 DIAGNOSIS — E1165 Type 2 diabetes mellitus with hyperglycemia: Secondary | ICD-10-CM | POA: Diagnosis not present

## 2021-05-07 DIAGNOSIS — E119 Type 2 diabetes mellitus without complications: Secondary | ICD-10-CM | POA: Diagnosis not present

## 2021-05-07 DIAGNOSIS — Z79899 Other long term (current) drug therapy: Secondary | ICD-10-CM | POA: Diagnosis not present

## 2021-05-07 DIAGNOSIS — Z794 Long term (current) use of insulin: Secondary | ICD-10-CM | POA: Diagnosis not present

## 2021-05-07 NOTE — Patient Outreach (Signed)
Aging Gracefully Program  05/07/2021  Bonnie Gonzalez July 20, 1956 [id number]  [facility] Evaluation Interviewer made contact with patient. Aging Gracefully initial survey scheduled for Thursday, [name] 9, 2022.  [place] Management Assistant [phone]

## 2021-05-09 ENCOUNTER — Other Ambulatory Visit: Payer: Self-pay

## 2021-05-09 ENCOUNTER — Encounter: Payer: Self-pay | Admitting: Family Medicine

## 2021-05-09 NOTE — Patient Outreach (Signed)
Aging Gracefully Program  05/09/2021  Bonnie Gonzalez Jul 24, 1956 [id number]  [facility] Evaluation Interviewer made contact with patient. Aging Gracefully survey not completed due to patient declining services.   Interviewer will send referral back to Sharol Given at [facility] follow up.  [place] Management Assistant  [phone]

## 2021-05-10 NOTE — Telephone Encounter (Signed)
The letter is ready  

## 2021-05-13 ENCOUNTER — Telehealth: Payer: Self-pay

## 2021-05-13 NOTE — Telephone Encounter (Signed)
Spoke with pt notifies that her [employer]nie [employer] Duty letter was ready for pick up at the office, pt stated to mail letter out to her home address. Letter sent

## 2021-05-16 DIAGNOSIS — Z1231 Encounter for screening mammogram for malignant neoplasm of breast: Secondary | ICD-10-CM | POA: Diagnosis not present

## 2021-05-16 LAB — HM MAMMOGRAPHY

## 2021-05-20 ENCOUNTER — Telehealth: Payer: Self-pay | Admitting: Family Medicine

## 2021-05-20 NOTE — Telephone Encounter (Signed)
Pt call and stated she want to know will dr.Fry call her a muscle relaxer and want a call back.

## 2021-05-21 ENCOUNTER — Telehealth: Payer: Self-pay | Admitting: Family Medicine

## 2021-05-21 NOTE — Telephone Encounter (Signed)
Left detailed message for pt to call regarding her jury duty letter.

## 2021-05-21 NOTE — Telephone Encounter (Signed)
Left a message for pt to call the office regarding request for muscle relaxer

## 2021-05-21 NOTE — Telephone Encounter (Signed)
Pt [place] Duty letter was faxed to pt office per pt request

## 2021-05-21 NOTE — Telephone Encounter (Signed)
Pt call and stated will you resend the letter for jury duties to [place] at [address] [place] ,[place] [phone] because she didn't receive it at home .please send ATTN Hulda Demarinis her # is [phone]. This is her work address .

## 2021-05-22 ENCOUNTER — Telehealth: Payer: Self-pay | Admitting: Family Medicine

## 2021-05-22 NOTE — Telephone Encounter (Signed)
[facility] is calling to see if the pts request form for her to have a Bone Density done tomorrow at 8:30 can be faxed back to them on today.

## 2021-05-23 ENCOUNTER — Encounter: Payer: Self-pay | Admitting: Family Medicine

## 2021-05-23 DIAGNOSIS — Z780 Asymptomatic menopausal state: Secondary | ICD-10-CM | POA: Diagnosis not present

## 2021-05-23 DIAGNOSIS — M8589 Other specified disorders of bone density and structure, multiple sites: Secondary | ICD-10-CM | POA: Diagnosis not present

## 2021-05-23 LAB — HM DEXA SCAN

## 2021-05-23 NOTE — Telephone Encounter (Signed)
Please advise 

## 2021-05-23 NOTE — Telephone Encounter (Signed)
I signed this form yesterday

## 2021-05-23 NOTE — Telephone Encounter (Signed)
Pt is aware to call the office and schedule appointment

## 2021-05-23 NOTE — Telephone Encounter (Signed)
Form was faxed to [facility], spoke with [facility] agent stated that pt already had the dexa scan done

## 2021-05-24 ENCOUNTER — Other Ambulatory Visit: Payer: Self-pay

## 2021-05-24 ENCOUNTER — Encounter: Payer: Self-pay | Admitting: Family Medicine

## 2021-05-24 MED ORDER — METHOCARBAMOL 500 MG PO TABS: 500.0000 mg | ORAL_TABLET | Freq: Four times a day (QID) | ORAL | 5 refills | Status: AC | PRN

## 2021-05-24 NOTE — Telephone Encounter (Signed)
It appears that the mammogram results are not ready yet. Also call in Methocarbamol 500 mg to take every 6 hours prn muscle spasms, #120 with 5 rf

## 2021-05-26 ENCOUNTER — Encounter: Payer: Self-pay | Admitting: Family Medicine

## 2021-05-27 MED ORDER — HYDROXYZINE PAMOATE 25 MG PO CAPS: 25.0000 mg | ORAL_CAPSULE | ORAL | 5 refills | Status: DC

## 2021-05-27 NOTE — Telephone Encounter (Signed)
Done

## 2021-05-31 ENCOUNTER — Encounter: Payer: Self-pay | Admitting: Family Medicine

## 2021-06-05 ENCOUNTER — Encounter: Payer: Self-pay | Admitting: Family Medicine

## 2021-06-10 ENCOUNTER — Encounter: Payer: Self-pay | Admitting: Family Medicine

## 2021-06-11 NOTE — Telephone Encounter (Signed)
Both the mammogram and the bone density results were scanned into her chart without me seeing them. I just now reviewed these . The mammogram was normal, so repeat this in one year. The bone density showed osteopenia. She needs to take 1500 mg of calcium and 2000 units of vitamin D every day. Repeat the DEXA in 2 years. As for the shortness of breath, have her make an OV with me so we can discuss this

## 2021-06-11 NOTE — Telephone Encounter (Signed)
Reviewed mammogram / Dexa scan results with pt verbalized understanding of Dr Sarajane Jews advise

## 2021-06-13 ENCOUNTER — Other Ambulatory Visit: Payer: Self-pay

## 2021-06-14 ENCOUNTER — Encounter: Payer: Self-pay | Admitting: Family Medicine

## 2021-06-14 ENCOUNTER — Ambulatory Visit (INDEPENDENT_AMBULATORY_CARE_PROVIDER_SITE_OTHER): Payer: PPO | Admitting: Family Medicine

## 2021-06-14 VITALS — BP 130/70 | HR 77 | Temp 98.0°F | Wt 270.6 lb

## 2021-06-14 DIAGNOSIS — I5032 Chronic diastolic (congestive) heart failure: Secondary | ICD-10-CM

## 2021-06-14 DIAGNOSIS — J449 Chronic obstructive pulmonary disease, unspecified: Secondary | ICD-10-CM

## 2021-06-14 DIAGNOSIS — Z1211 Encounter for screening for malignant neoplasm of colon: Secondary | ICD-10-CM

## 2021-06-14 LAB — HEMOGLOBIN A1C: Hemoglobin A1C: 9.1

## 2021-06-14 MED ORDER — FUROSEMIDE 40 MG PO TABS: 40.0000 mg | ORAL_TABLET | Freq: Every day | ORAL | 3 refills | Status: DC

## 2021-06-14 NOTE — Progress Notes (Signed)
   Subjective:    Patient ID: Bonnie Gonzalez, female    DOB: 22-Jun-1956, 65 y.o.   MRN: [id number]  HPI Here to follow up . She is wearing her oxygen all the time, except when she goes to the bathroom. When she does this, her sats quickly drop tot 73-82%. She has been taking 1/2 tablet of Lasix (20 mg) daily, but her feet swell and she has been SOB. She does not see a Cardiologist. Her last ECHO in 2020 showed en EF of 60-65% but she had Grade I diastolic dysfunction.    Review of Systems  Constitutional:  Positive for fatigue.  Respiratory:  Positive for shortness of breath and wheezing. Negative for cough.   Cardiovascular:  Positive for leg swelling. Negative for chest pain and palpitations.      Objective:   Physical Exam Constitutional:      Appearance: She is obese.     Comments: In a wheelchair   Pulmonary:     Effort: Pulmonary effort is normal.     Breath sounds: Normal breath sounds.  Musculoskeletal:     Comments: 2 + edema in both lower legs and feet   Neurological:     Mental Status: She is alert.          Assessment & Plan:  Her COPD is stable, but the diastolic CHF has been more of a problem. We will increase the Lasix to a full 40 mg tablet daily and we will refer her to Cardiology to assist [name].  [name], MD

## 2021-07-08 ENCOUNTER — Encounter: Payer: Self-pay | Admitting: Psychology

## 2021-07-08 ENCOUNTER — Other Ambulatory Visit: Payer: Self-pay

## 2021-07-08 ENCOUNTER — Ambulatory Visit: Payer: PPO | Admitting: Psychology

## 2021-07-08 ENCOUNTER — Ambulatory Visit (INDEPENDENT_AMBULATORY_CARE_PROVIDER_SITE_OTHER): Payer: PPO | Admitting: Psychology

## 2021-07-08 DIAGNOSIS — F410 Panic disorder [episodic paroxysmal anxiety] without agoraphobia: Secondary | ICD-10-CM

## 2021-07-08 DIAGNOSIS — Z79899 Other long term (current) drug therapy: Secondary | ICD-10-CM | POA: Diagnosis not present

## 2021-07-08 DIAGNOSIS — F411 Generalized anxiety disorder: Secondary | ICD-10-CM | POA: Diagnosis not present

## 2021-07-08 DIAGNOSIS — F332 Major depressive disorder, recurrent severe without psychotic features: Secondary | ICD-10-CM

## 2021-07-08 DIAGNOSIS — F330 Major depressive disorder, recurrent, mild: Secondary | ICD-10-CM | POA: Diagnosis not present

## 2021-07-08 DIAGNOSIS — R4189 Other symptoms and signs involving cognitive functions and awareness: Secondary | ICD-10-CM | POA: Diagnosis not present

## 2021-07-08 DIAGNOSIS — F5101 Primary insomnia: Secondary | ICD-10-CM | POA: Diagnosis not present

## 2021-07-08 NOTE — Progress Notes (Signed)
NEUROPSYCHOLOGICAL EVALUATION [facility] Department of Neurology  Date of Evaluation: July 08, 2021  Reason for Referral:   Kamalani Arlis Everly is a 65 y.o. right-handed Caucasian female referred by Alonza Bogus, D.O., to characterize her current cognitive functioning and assist with diagnostic clarity and treatment planning in the context of subjective cognitive decline and tremor.   Assessment and Plan:   Clinical Impression(s): Ms. Knapke pattern of performance is suggestive of neuropsychological functioning within normal limits relative to age-matched peers. No patterns of weakness emerged as performance across all assessed cognitive domains was appropriate. This includes processing speed, attention/concentration, executive functioning, safety/judgment, receptive and expressive language, visuospatial abilities, and learning and memory. Ms. Marxen denied difficulties completing instrumental activities of daily living (ADLs) independently.   Regarding psychological functioning, Ms. Niehaus reported acute symptoms of moderate anxiety and severe depression. It is likely that subjective cognitive dysfunction is due to normal aging, exacerbated by significant psychiatric distress, sleep disruption and fatigue, pain and other medical ailments, and polypharmacy. Specific to memory, Ms. Stooksbury was able to learn novel verbal and visual information efficiently and retain this knowledge after lengthy delays. Overall, memory performance combined with intact performances across other areas of cognitive functioning is not suggestive of Alzheimer's disease. Likewise, her cognitive and behavioral profile is not suggestive of any other form of neurodegenerative illness presently.  Recommendations: Should Ms. Lydon report cognitive or functional decline in the future, a repeat evaluation would be warranted at that time. The current evaluation will serve as an excellent baseline to  compare future evaluations against.   A combination of medication and psychotherapy has been shown to be most effective at treating symptoms of anxiety and depression. As such, Ms. Sarvis is encouraged to speak with her prescribing physician regarding medication adjustments to optimally manage these symptoms.  Likewise, it is recommended that [name] re-engage in short-term psychotherapy to address symptoms of psychiatric distress. She would benefit from an active and collaborative therapeutic environment, rather than one purely supportive in nature. Recommended treatment modalities include Cognitive Behavioral Therapy (CBT) or Acceptance and Commitment Therapy (ACT).  Ms. Stargell is encouraged to attend to lifestyle factors for brain health (e.g., regular physical exercise, good nutrition habits, regular participation in cognitively-stimulating activities, and general stress management techniques), which are likely to have benefits for both emotional adjustment and cognition. In fact, in addition to promoting good general health, regular exercise incorporating aerobic activities (e.g., brisk walking, jogging, cycling, etc.) has been demonstrated to be a very effective treatment for depression and stress, with similar efficacy rates to both antidepressant medication and psychotherapy. Optimal control of vascular risk factors (including safe cardiovascular exercise and adherence to dietary recommendations) is encouraged. Likewise, continued compliance with her CPAP machine will also be important. It will be very important to continue monitoring oxygen saturation levels and ensure that potential causes of lower levels are known and treated by medical professionals. Hypoxia is a risk factor for memory decline.   Memory can be improved using internal strategies such as rehearsal, repetition, chunking, mnemonics, association, and imagery. External strategies such as written notes in a consistently used memory  journal, visual and nonverbal auditory cues such as a calendar on the refrigerator or appointments with alarm, such as on a cell phone, can also help maximize recall.    To address problems with processing speed, she may wish to consider:   -Ensuring that she is alerted when essential material or instructions are being presented   -Adjusting  the speed at which new information is presented   -Allowing for more time in comprehending, processing, and responding in conversation  To address problems with fluctuating attention, she may wish to consider:   -Avoiding external distractions when needing to concentrate   -Limiting exposure to fast paced environments with multiple sensory demands   -Writing down complicated information and using checklists   -Attempting and completing one task at a time (i.e., no multi-tasking)   -Verbalizing aloud each step of a task to maintain focus   -Taking frequent breaks during the completion of steps/tasks to avoid fatigue   -Reducing the amount of information considered at one time  Review of Records:   Ms. Lampe was seen by [facility] Narda Amber, D.O.) on 01/03/2014 for an evaluation of memory loss. Since mid-2014, she reported short-term memory becoming more impaired. She noticed that she would be unable to recall words and was forgetting peoples names. She also reported problems misplacing things about 1-2 times per week. ADLs were described as intact. She reported a 20 year history of depression which is treated well. She reported still getting depressed intermittently, but is able to come out of it herself. She described her mood as "testy and argumentative". She also reported a great deal of family- and work-related stress at that time. Performance on a brief cognitive screening instrument (MOCA) was 28/30. Ultimately, Dr. Posey Pronto felt that the overlay of ongoing depressive symptoms and various stressors were the culprit for reported cognitive changes.    She was most recently seen by [facility] Wells Guiles Tat, D.O.) on 04/11/2021 for an evaluation of memory changes and tremor. ADLs were described as intact. Memory concerns were similar to what was described back in 2015. Tremors were said to have started about one year prior to this appointment and seem to involve the left hand somewhat more than the right. A head tremor was also noted. During her neurological exam, Dr. Carles Collet did not observe a resting or postural tremor. There was mention of a slight intention tremor. Dr. Doristine Devoid suspicion for Parkinson's disease was reportedly low and tremors were thought to either represent essential tremor or medication side effects. Ultimately, Ms. Vaness was referred for a comprehensive neuropsychological evaluation to characterize her cognitive abilities and to assist with diagnostic clarity and treatment planning.   Ms. Barse was seen by her PCP [name], M.D.) on 06/14/2021 for regular follow-up. She reported wearing her oxygen all the time except when she goes to the bathroom. When she does this, her sats quickly drop to 73-82%. She has been taking 1/2 tablet of Lasix (20 mg) daily, but her feet swell and she has been short of breath. She does not see a Cardiologist. Her last ECHO in 2020 showed an EF of 60-65% but she had Grade I diastolic dysfunction. While her COPD was said to be stable, Dr. Sarajane Jews expressed concern surrounding congestive heart failure and Lasix was increased.  Brain MRI on 01/10/2014 revealed minimal small vessel ischemic changes. No other neuroimaging was available for review.   Past Medical History:  Diagnosis Date   Acquired hypothyroidism 10/01/2010   Allergic rhinitis 07/20/2009   Arthritis    Back pain 10/18/2015   Bilateral leg edema 03/31/2018   Centrilobular emphysema 02/04/2019   HRCT 09/15/18   Chronic diastolic CHF (congestive heart failure) 01/28/2019   Chronic respiratory failure with hypoxia 02/04/2019   6 Min Walk  08/31/18 at [facility]   Chronic urticaria 12/21/2019   COPD (chronic obstructive pulmonary  disease) 09/01/2018   Quit smoking 2010  - Spirometry 08/31/2018  FEV1 1.1 (51%)  Ratio 66 s prior rx with typical curvature  - 08/31/2018  After extensive coaching inhaler device,  effectiveness =    90% with elipta > try anoro sample > no better so d/c'  - PFT's  11/10/2018  FEV1 1.24 (59 % ) ratio 75 (ratio with fev1/VC =  63)  p no % improvement from saba p nothing prior to study with DLCO  105  % corrects to 132  %    DOE (dyspnea on exertion) 08/31/2018   Onset 05/2018 with background of unexplained leg swelling x 01/2018   -echo 04/13/18 just G1   diastolic dysfunction s PH   -  08/31/2018   Walked RA x one lap @ 185 stopped due to  Sob/ sats 89% at nl pace  - Spirometry 08/31/2018  FEV1 1.1 (51%)  Ratio 66 s prior rx  - 09/28/2018 could not do full pfts 09/28/2018  - Collagen vasc profile 09/28/18  For ESR =  54 (was 102 before prednisone)  Neg collag   Dyshidrotic eczema 12/13/2010   Essential hypertension 01/26/2019   Generalized anxiety disorder with panic attacks 01/20/2018   GERD (gastroesophageal reflux disease) 11/03/2007   Headache 11/03/2007   Irritable bowel syndrome 07/13/2009   Major depressive disorder 07/17/2020   Mixed hyperlipidemia 07/20/2009   Obesity    OSA (obstructive sleep apnea) 07/26/2015   HST 08/2015 mild OSA, AHI 8/hour, lowest desaturation 81%, desaturations noted without respiratory events  Formatting of this note might be different from the original. PSG 05/20/19 AHI 25.1   Osteopenia of multiple sites 06/01/2019   DEXA 05/24/2019: Right femur neck T- 2.2, left femur neck T- 1.5, right total femur T -0.5, left total femur T -0.5, AP total spine T- 1.9. FRAX 10-year probability of major osteoporotic fracture is 8.8% and a hip fracture is 1.2%  DEXA 05/05/2019: Right femur neck T- 1.9, left femur neck T- 2.0, right total femur T -1.3, left total femur   Oxygen deficiency     Postinflammatory pulmonary fibrosis 09/15/2018   ESR    10/161/9  = 102   > rx short term prednisone ? slt improvement in symptoms HRCT 09/15/2018  1. Very mild basilar subpleural ground-glass, reticulation and traction bronchiolectasis, findings which may be minimally progressive from 06/29/2017 and are indicative of interstitial lung disease such as nonspecific interstitial pneumonitis. Findings are indeterminate for UIP per consensus guidelin   Psychophysiological insomnia 03/29/2019   Type 2 diabetes mellitus 02/09/2017   UTI (urinary tract infection)    Vertigo 03/29/2014   Vitamin D deficiency 09/15/2020    Past Surgical History:  Procedure Laterality Date   ANKLE FRACTURE SURGERY Right    benign tumor      removed from groin   WRIST ARTHROCENTESIS N/A 2001    Current Outpatient Medications:    albuterol (PROVENTIL HFA;VENTOLIN HFA) 108 (90 Base) MCG/ACT inhaler, Inhale 2 puffs into the lungs every 4 (four) hours as needed for wheezing or shortness of breath., Disp: 1 Inhaler, Rfl: 11   ALPRAZolam (XANAX) 0.5 MG tablet, TAKE 1 TABLET (0.5 MG TOTAL) BY MOUTH 2 (TWO) TIMES DAILY AS NEEDED. (Patient taking differently: Take 0.25 mg by mouth See admin instructions. Take every night and daily as needed for anxiety), Disp: 60 tablet, Rfl: 5   atorvastatin (LIPITOR) 40 MG tablet, Take 1 tablet (40 mg total) by mouth daily., Disp: 90 tablet, Rfl: 3  azelastine (ASTELIN) 0.1 % nasal spray, Place into the nose., Disp: , Rfl:    benzonatate (TESSALON) 200 MG capsule, Take 1 capsule (200 mg total) by mouth 2 (two) times daily as needed for cough., Disp: 30 capsule, Rfl: 5   Cholecalciferol 50 MCG (2000 UT) CAPS, Take 1 capsule by mouth daily., Disp: , Rfl:    Continuous Blood Gluc Sensor (FREESTYLE LIBRE 14 DAY SENSOR) MISC, by Does not apply route., Disp: , Rfl:    cyanocobalamin 1000 MCG tablet, Take 1 tablet by mouth daily., Disp: , Rfl:    dicyclomine (BENTYL) 20 MG tablet, Take 20 mg by mouth  2 (two) times daily., Disp: , Rfl:    donepezil (ARICEPT) 10 MG tablet, TAKE 1 TABLET BY MOUTH AT BEDTIME (Patient taking differently: Take 10 mg by mouth at bedtime.), Disp: 30 tablet, Rfl: 5   DULoxetine (CYMBALTA) 60 MG capsule, Take 1 capsule (60 mg total) by mouth daily. (Patient taking differently: Take 60 mg by mouth at bedtime.), Disp: 90 capsule, Rfl: 3   esomeprazole (NEXIUM) 40 MG capsule, Take 30- 60 min before your first and last meals of the day (Patient taking differently: Take 40 mg by mouth daily before breakfast. Take 1 capsule by mouth daily), Disp: 180 capsule, Rfl: 3   eszopiclone (LUNESTA) 2 MG TABS tablet, Take 2 mg by mouth once., Disp: , Rfl:    fluconazole (DIFLUCAN) 150 MG tablet, Take 1 tablet po x 1 and repeat in 3 days, Disp: , Rfl:    Fluticasone-Umeclidin-Vilant (TRELEGY ELLIPTA) 100-62.5-25 MCG/INH AEPB, Inhale into the lungs. Inhale 1 puff into lungs daily for 30 days., Disp: , Rfl:    furosemide (LASIX) 40 MG tablet, Take 1 tablet (40 mg total) by mouth daily., Disp: 90 tablet, Rfl: 3   glucose 4 GM chewable tablet, Chew 1 tablet (4 g total) by mouth as needed for low blood sugar (< 70)., Disp: 50 tablet, Rfl: 0   halobetasol (ULTRAVATE) 0.05 % cream, Apply topically 2 (two) times daily as needed. (Patient taking differently: Apply 1 application topically 2 (two) times daily as needed (for itching).), Disp: 30 g, Rfl: 5   hydrocortisone 2.5 % ointment, Apply topically., Disp: , Rfl:    hydrOXYzine (VISTARIL) 25 MG capsule, Take 1 capsule (25 mg total) by mouth See admin instructions. Take 1 capsule by mouth three times daily as needed., Disp: 90 capsule, Rfl: 5   Hyprom-Naphaz-Polysorb-Zn Sulf (CLEAR EYES COMPLETE) SOLN, Place 1-2 drops into both eyes as needed (for itching)., Disp: , Rfl:    insulin aspart (NOVOLOG) 100 UNIT/ML injection, Use sliding scale provided at time of discharge, administer 3 times daily with meals (Patient taking differently: Inject 10 Units  into the skin 3 (three) times daily with meals. Administer 35 units plus correction three times daily with meals and 30 units with evening snack: MAX TDD 200 Units), Disp: 10 mL, Rfl: 0   Insulin Degludec 200 UNIT/ML SOPN, Inject 80 Units into the skin every morning. Inject 80 units into the skin in the morning and at bedtime., Disp: , Rfl:    Insulin Pen Needle (B-D UF III MINI PEN NEEDLES) 31G X 5 MM MISC, Use to administer insulin five times a day, Disp: , Rfl:    levothyroxine (SYNTHROID) 175 MCG tablet, TAKE 1 TABLET BY MOUTH ONCE DAILY BEFORE BREAKFAST, Disp: 90 tablet, Rfl: 3   meclizine (ANTIVERT) 50 MG tablet, Take 0.5 tablets (25 mg total) by mouth 3 (three) times daily as needed. (  Patient taking differently: Take by mouth. Take 25 mg by mouth prn), Disp: 30 tablet, Rfl: 0   methocarbamol (ROBAXIN) 500 MG tablet, Take 1 tablet (500 mg total) by mouth every 6 (six) hours as needed for muscle spasms., Disp: 120 tablet, Rfl: 5   naproxen (NAPROSYN) 500 MG tablet, Take 500 mg by mouth 2 (two) times daily. , Disp: , Rfl: 1   nystatin (MYCOSTATIN/NYSTOP) powder, Apply thin layer to rash on lower abdomen BID until rash resolves then once a day, Disp: , Rfl:    ondansetron (ZOFRAN) 4 MG tablet, Take 4 mg by mouth every 8 (eight) hours as needed for nausea or vomiting., Disp: , Rfl:    Probiotic Product (PROBIOTIC-10 PO), Take by mouth., Disp: , Rfl:    Semaglutide,0.25 or 0.5MG/DOS, 2 MG/1.5ML SOPN, Inject 1 Dose into the skin once a week. Inject 0.4 mL's (0.42m total) into the skin once a week., Disp: , Rfl:    Tiotropium Bromide-Olodaterol 2.5-2.5 MCG/ACT AERS, Inhale 2.5 mcg into the lungs daily., Disp: , Rfl:    traZODone (DESYREL) 50 MG tablet, Take 25 mg by mouth at bedtime., Disp: , Rfl:   Clinical Interview:   The following information was obtained during a clinical interview with Ms. ABrigandiand her daughter prior to cognitive testing.  Cognitive Symptoms: Decreased short-term memory:  Endorsed. Ms. AHeinkedescribed symptoms surrounding trouble recalling the details of previous conversations, as well as names of familiar individuals. She also reported trouble misplacing/losing things in her environment. Her and her daughter reported memory concerns being present for the past several years (records suggest mid 2014) and that they have progressively worsened over time.  Decreased long-term memory: Denied. Decreased attention/concentration: Endorsed. She reported mild difficulties with sustained attention and somewhat more pronounced trouble with increased distractibility.  Reduced processing speed: Endorsed. Specifically, she highlighted it taking her far longer to accomplish various tasks currently.  Difficulties with executive functions: Endorsed. Her daughter reported longstanding personality traits surrounding indecision. Ms. ACrimireported feeling that she has been less organized lately. She also reported some impulsivity in terms of having less of a filter while speaking (e.g., people asking her "why would you say that?").  Difficulties with emotion regulation: Denied. Difficulties with receptive language: Denied. Difficulties with word finding: Endorsed. She reported knowing what she wants to say but cannot think of the word. This was happening more and more frequently while at work prior to her recent retirement.  Decreased visuoperceptual ability: Denied.  Difficulties completing ADLs: Largely denied. However, she did describe occasionally forgetting to take her insulin. She also reported some mild depth perception concerns while driving at night. Prior accidents were denied. She also reported getting briefly lost/turned around while driving on two occassions in the past. Her daughter also described instances where she may forget to charge her oxygen machine.   Additional Medical History: History of traumatic brain injury/concussion: Denied. History of stroke:  Denied. History of seizure activity: Denied. History of known exposure to toxins: Denied. Symptoms of chronic pain: Endorsed. She reported fairly diffuse chronic pain, with particular areas of emphasis including her back, shoulder, hips, and knees. Pain symptoms were said to impact balance to some extent.  Experience of frequent headaches/migraines: Denied.  Balance/coordination difficulties: Endorsed. She primarily reported weakness in her lower extremities. She ambulated with a rolling walker during the current appointment and stated that she had become more reliant on using a cane while at home. She was unclear if one side of the body seemed  worse than the other. She does have a large amount of fatigue, especially upon exertion, which could be contributing. She reported a few falls over the past couple of years with the most recent being about one month prior to the current appointment.  Other motor difficulties: Endorsed. Mild tremors were said to affect her head and her upper extremities bilaterally. She was unsure if one side was worse than the other, but tremors were said to have worsened over time. Tremors were said to occur somewhat randomly and Ms. Rasheed was unable to provide any precipitating factors.   Sleep History: Estimated hours obtained each night: 5-6 hours.  Difficulties falling asleep: Endorsed. She noted difficulties being caused by her mind being unable to shut down while trying to go to sleep. She noted that she does take sleep medications, as well as a nighttime Xanax, to help with this.  Difficulties staying asleep: Denied. Feels rested and refreshed upon awakening: Denied. She reported waking with significant fatigue, stating that she has fallen asleep on the toilet after awakening some mornings. Her daughter added that she will sometimes be in danger of falling asleep while driving. She frequently naps during the day. Consistent with what was reported to Dr. Sarajane Jews, when she  briefly removes her oxygen while using the restroom, oxygen saturation levels were said to quickly drop to the 70s-80s. Her lowest reading was said to be 59.   History of snoring: Endorsed. History of waking up gasping for air: Endorsed. Witnessed breath cessation while asleep: Endorsed. She has a history of obstructive sleep apnea and uses her CPAP machine nightly. She noted that this data is monitored by her pulmonologist. However, she has not reported a noticeable improvement in energy levels since using this device.   History of vivid dreaming: Denied. Excessive movement while asleep: Denied. Her daughter noted that she will talk in her sleep, which is longstanding in nature.  Instances of acting out her dreams: Denied.  Psychiatric/Behavioral Health History: Depression: Ms. Chrystal reported a longstanding history of significant depressive symptoms. She is followed by a psychiatrist and has worked with an Haematologist in the past with some success. She reported that current medications were said to be helpful at managing her symptoms. She described her current mood as "more irritable." Current or remote suicidal ideation, intent, or plan was denied.  Anxiety: She also acknowledged a longstanding history of anxiety symptoms, particularly heightened in social settings, with prior panic attacks. Current medications (including Xanax) were said to be helpful at managing her symptoms.  Mania: Denied. Trauma History: Denied. Visual/auditory hallucinations: Denied. Delusional thoughts: Denied.  Tobacco: Denied. Alcohol: She denied current alcohol consumption as well as a history of problematic alcohol abuse or dependence.  Recreational drugs: Denied.  Family History: Family History  Problem Relation Age of Onset   Arthritis/Rheumatoid Mother    Alzheimer's disease Father    Diabetes Mellitus II Father    Dementia Father    Diabetes Mellitus II Sister    Diabetes Mellitus II Brother     Asthma Other    Depression Other    Diabetes Other    Parkinsonism Other    Lupus Other    This information was confirmed by [name].  Academic/Vocational History: Highest level of educational attainment: 13.5 years. She graduated from high school and reported completing 1.5 years of college. She described herself as a fairly good (A/B) student in academic settings. Math was noted as a likely relative weakness.  History of developmental delay:  Denied. History of grade repetition: Denied. Enrollment in special education courses: Denied. History of LD/ADHD: Denied.  Employment: Retired. She previously worked in [place] throughout her life.   Evaluation Results:   Behavioral Observations: Ms. Freeman was accompanied by her daughter, arrived to her appointment on time, and was appropriately dressed and groomed. She appeared alert and oriented. She ambulated slowly with the assistance of a rolling walker. She did appear to experience some shortness of breath and wore her oxygen mask throughout. Tremors were observed in both hands at separate points in time during the interview. There was not a known antecedent in either case and symptoms eventually dissipated. Her affect was generally relaxed and positive, but did range appropriately given the subject being discussed. Spontaneous speech was fluent. Mild word finding difficulties were observed during interview. Thought processes were coherent, organized, and normal in content. Insight into her cognitive difficulties appeared adequate.   During testing, no tremor symptoms were observed, even while performing fine motor tasks. Sustained attention was appropriate. Task engagement was adequate and she persisted when challenged. Overall, Ms. Dibbern was cooperative with the clinical interview and subsequent testing procedures.   Adequacy of Effort: The validity of neuropsychological testing is limited by the extent to  which the individual being tested may be assumed to have exerted adequate effort during testing. Ms. Mcclenney expressed her intention to perform to the best of her abilities and exhibited adequate task engagement and persistence. Scores across stand-alone and embedded performance validity measures were within expectation. As such, the results of the current evaluation are believed to be a valid representation of Ms. Helder's current cognitive functioning.  Test Results: Ms. Mazurek was fully oriented at the time of the current evaluation.  Intellectual abilities based upon educational and vocational attainment were estimated to be in the average range. Premorbid abilities were estimated to be within the average range based upon a single-word reading test.   Processing speed was below average to average. Basic attention was average. More complex attention (e.g., working memory) was also average. Executive functioning was average. Performance on a task assessing safety and judgment was well above average.  Assessed receptive language abilities were above average. Likewise, Ms. Michiels did not exhibit any difficulties comprehending task instructions and answered all questions asked of her appropriately. Assessed expressive language (e.g., verbal fluency and confrontation naming) was average to above average.     Assessed visuospatial/visuoconstructional abilities were average.    Learning (i.e., encoding) of novel verbal and visual information was average to well above average. Spontaneous delayed recall (i.e., retrieval) of previously learned information was also average to well above average. Retention rates were 100% across a story learning task, 113% across a list learning task, and 100% across a shape learning task. Performance across recognition tasks was average to above average, suggesting evidence for information consolidation.   Results of emotional screening instruments suggested that recent  symptoms of generalized anxiety were in the moderate range, while symptoms of depression were within the severe range. A screening instrument assessing recent sleep quality suggested the presence of mild sleep dysfunction.  Tables of Scores:   Note: This summary of test scores accompanies the interpretive report and should not be considered in isolation without reference to the appropriate sections in the text. Descriptors are based on appropriate normative data and may be adjusted based on clinical judgment. Terms such as "Within Normal Limits" and "Outside Normal Limits" are used when a more specific description of the test score cannot  be determined.       Percentile - Normative Descriptor > 98 - Exceptionally High 91-97 - Well Above Average 75-90 - Above Average 25-74 - Average 9-24 - Below Average 2-8 - Well Below Average < 2 - Exceptionally Low       Validity:   DESCRIPTOR       ACS Word Choice: --- --- Within Normal Limits  Dot Counting Test: --- --- Within Normal Limits  NAB EVI: --- --- Within Normal Limits  D-KEFS Color Word Effort Index: --- --- Within Normal Limits       Orientation:      Raw Score Percentile   NAB Orientation, Form 1 29/29 --- ---       Cognitive Screening:      Raw Score Percentile   SLUMS: 25/30 --- ---       Intellectual Functioning:      Standard Score Percentile   Test of Premorbid Functioning: 109 73 Average       Memory:     NAB Memory Module, Form 1: Standard Score/ T Score Percentile   Total Memory Index 116 86 Above Average  List Learning       Total Trials 1-3 25/36 (54) 66 Average    List B 5/12 (54) 66 Average    Short Delay Free Recall 8/12 (53) 62 Average    Long Delay Free Recall 9/12 (57) 75 Above Average    Retention Percentage 113 (56) 73 Average    Recognition Discriminability 10 (57) 75 Above Average  Shape Learning       Total Trials 1-3 20/27 (62) 88 Above Average    Delayed Recall 9/9 (69) 97 Well Above Average     Retention Percentage 100 (51) 54 Average    Recognition Discriminability 7 (52) 58 Average  Story Learning       Immediate Recall 58/80 (45) 31 Average    Delayed Recall 34/40 (52) 58 Average    Retention Percentage 100 (55) 69 Average  Daily Living Memory       Immediate Recall 48/51 (64) 92 Well Above Average    Delayed Recall 16/17 (61) 86 Above Average    Retention Percentage 94 (55) 69 Average    Recognition Hits 9/10 (52) 58 Average       Attention/Executive Function:     Trail Making Test (TMT): Raw Score (T Score) Percentile     Part A 43 secs.,  0 errors (42) 21 Below Average    Part B 86 secs.,  0 errors (49) 46 Average         Scaled Score Percentile   WAIS-IV Coding: 8 25 Average       NAB Attention Module, Form 1: T Score Percentile     Digits Forward 53 62 Average    Digits Backwards 49 46 Average        Scaled Score Percentile   WAIS-IV Similarities: 9 37 Average       D-KEFS Color-Word Interference Test: Raw Score (Scaled Score) Percentile     Color Naming 31 secs. (11) 63 Average    Word Reading 23 secs. (11) 63 Average    Inhibition 76 secs. (9) 37 Average      Total Errors 1 error (11) 63 Average    Inhibition/Switching 91 secs. (8) 25 Average      Total Errors 3 errors (10) 50 Average       NAB Executive Functions Module, Form 1: T Score Percentile  Judgment 68 96 Well Above Average       Language:     Verbal Fluency Test: Raw Score (T Score) Percentile     Phonemic Fluency (FAS) 37 (45) 31 Average    Animal Fluency 17 (46) 34 Average        NAB Language Module, Form 1: T Score Percentile     Auditory Comprehension 58 79 Above Average    Naming 31/31 (57) 75 Above Average       Visuospatial/Visuoconstruction:      Raw Score Percentile   Clock Drawing: 10/10 --- Within Normal Limits       NAB Spatial Module, Form 1: T Score Percentile     Figure Drawing Copy 55 69 Average        Scaled Score Percentile   WAIS-IV Block Design: 10 50  Average       Mood and Personality:      Raw Score Percentile   Beck Depression Inventory - II: 29 --- Severe  PROMIS Anxiety Questionnaire: 24 --- Moderate       Additional Questionnaires:      Raw Score Percentile   PROMIS Sleep Disturbance Questionnaire: 29 --- Mild   Informed Consent and Coding/Compliance:   The current evaluation represents a clinical evaluation for the purposes previously outlined by the referral source and is in no way reflective of a forensic evaluation.   Ms. Dix was provided with a verbal description of the nature and purpose of the present neuropsychological evaluation. Also reviewed were the foreseeable risks and/or discomforts and benefits of the procedure, limits of confidentiality, and mandatory reporting requirements of this provider. The patient was given the opportunity to ask questions and receive answers about the evaluation. Oral consent to participate was provided by the patient.   This evaluation was conducted by Christia Reading, Ph.D., licensed clinical neuropsychologist. Ms. Friberg completed a clinical interview with Dr. Melvyn Novas, billed as one unit [phone], and 165 minutes of cognitive testing and scoring, billed as one unit [phone] and five additional units [phone]. Psychometrist Milana Kidney, B.S., assisted Dr. Melvyn Novas with test administration and scoring procedures. As a separate and discrete service, Dr. Melvyn Novas spent a total of 120 minutes in interpretation and report writing billed as one unit [phone] and one unit [phone].

## 2021-07-08 NOTE — Progress Notes (Signed)
   Psychometrician Note   Cognitive testing was administered to Bonnie Gonzalez by Milana Kidney, B.S. (psychometrist) under the supervision of Dr. Christia Reading, Ph.D., licensed psychologist on 07/08/21. Ms. Grahl did not appear overtly distressed by the testing session per behavioral observation or responses across self-report questionnaires. Rest breaks were offered.    The battery of tests administered was selected by Dr. Christia Reading, Ph.D. with consideration to Ms. Vanevery's current level of functioning, the nature of her symptoms, emotional and behavioral responses during interview, level of literacy, observed level of motivation/effort, and the nature of the referral question. This battery was communicated to the psychometrist. Communication between Dr. Christia Reading, Ph.D. and the psychometrist was ongoing throughout the evaluation and Dr. Christia Reading, Ph.D. was immediately accessible at all times. Dr. Christia Reading, Ph.D. provided supervision to the psychometrist on the date of this service to the extent necessary to assure the quality of all services provided.    Yemariam Sheniece Atnip will return within approximately 1-2 weeks for an interactive feedback session with Dr. Melvyn Novas at which time her test performances, clinical impressions, and treatment recommendations will be reviewed in detail. Ms. Cassarino understands she can contact our office should she require our assistance before this time.  A total of 165 minutes of billable time were spent face-to-face with Ms. Garnett by the psychometrist. This includes both test administration and scoring time. Billing for these services is reflected in the clinical report generated by Dr. Christia Reading, Ph.D.  This note reflects time spent with the psychometrician and does not include test scores or any clinical interpretations made by Dr. Melvyn Novas. The full report will follow in a separate note.

## 2021-07-15 ENCOUNTER — Ambulatory Visit (INDEPENDENT_AMBULATORY_CARE_PROVIDER_SITE_OTHER): Payer: PPO | Admitting: Psychology

## 2021-07-15 ENCOUNTER — Other Ambulatory Visit: Payer: Self-pay

## 2021-07-15 DIAGNOSIS — F332 Major depressive disorder, recurrent severe without psychotic features: Secondary | ICD-10-CM | POA: Diagnosis not present

## 2021-07-15 DIAGNOSIS — F410 Panic disorder [episodic paroxysmal anxiety] without agoraphobia: Secondary | ICD-10-CM

## 2021-07-15 DIAGNOSIS — F411 Generalized anxiety disorder: Secondary | ICD-10-CM | POA: Diagnosis not present

## 2021-07-15 DIAGNOSIS — R4189 Other symptoms and signs involving cognitive functions and awareness: Secondary | ICD-10-CM

## 2021-07-15 NOTE — Progress Notes (Signed)
   Neuropsychology Feedback Session Tillie Rung. [facility] Department of Neurology  Reason for Referral:   Kiesha Robbye Spangler is a 65 y.o. right-handed Caucasian female referred by Alonza Bogus, D.O., to characterize her current cognitive functioning and assist with diagnostic clarity and treatment planning in the context of subjective cognitive decline and tremor.   Feedback:   Ms. Sylva completed a comprehensive neuropsychological evaluation on 07/08/2021. Please refer to that encounter for the full report and recommendations. Briefly, results suggested neuropsychological functioning within normal limits relative to age-matched peers. No patterns of weakness emerged as performance across all assessed cognitive domains was appropriate. Regarding psychological functioning, Ms. Dukette reported acute symptoms of moderate anxiety and severe depression. It is likely that subjective cognitive dysfunction is due to normal aging, exacerbated by significant psychiatric distress, sleep disruption and fatigue, pain and other medical ailments, and polypharmacy.  Ms. Nahas was accompanied by her daughter during the current feedback session. Content of the current session focused on the results of her neuropsychological evaluation. Ms. Demske was given the opportunity to ask questions and her questions were answered. She was encouraged to reach out should additional questions arise. A copy of her report was provided at the conclusion of the visit.      20 minutes were spent conducting the current feedback session with [name], billed as one unit [phone].

## 2021-07-24 DIAGNOSIS — J9611 Chronic respiratory failure with hypoxia: Secondary | ICD-10-CM | POA: Diagnosis not present

## 2021-07-24 DIAGNOSIS — J449 Chronic obstructive pulmonary disease, unspecified: Secondary | ICD-10-CM | POA: Diagnosis not present

## 2021-07-24 DIAGNOSIS — J432 Centrilobular emphysema: Secondary | ICD-10-CM | POA: Diagnosis not present

## 2021-07-31 ENCOUNTER — Ambulatory Visit (INDEPENDENT_AMBULATORY_CARE_PROVIDER_SITE_OTHER): Payer: PPO | Admitting: Family Medicine

## 2021-07-31 ENCOUNTER — Other Ambulatory Visit: Payer: Self-pay

## 2021-07-31 ENCOUNTER — Encounter: Payer: Self-pay | Admitting: Family Medicine

## 2021-07-31 VITALS — BP 138/78 | HR 76 | Temp 98.4°F | Wt 269.0 lb

## 2021-07-31 DIAGNOSIS — I5032 Chronic diastolic (congestive) heart failure: Secondary | ICD-10-CM | POA: Diagnosis not present

## 2021-07-31 DIAGNOSIS — J439 Emphysema, unspecified: Secondary | ICD-10-CM

## 2021-07-31 DIAGNOSIS — R600 Localized edema: Secondary | ICD-10-CM | POA: Diagnosis not present

## 2021-07-31 DIAGNOSIS — J9611 Chronic respiratory failure with hypoxia: Secondary | ICD-10-CM | POA: Diagnosis not present

## 2021-07-31 NOTE — Progress Notes (Signed)
   Subjective:    Patient ID: Bonnie Gonzalez, female    DOB: 1956/04/08, 65 y.o.   MRN: [id number]  HPI Here complaining of worsening SOB. She has COPD, primarily emphysema, and she sees Bonnie Keeler PA of [facility]. She last saw Gonzalez on 05-02-21, and since then she has been keeping her oxygen flow at 2 liters. She wears it most of the time during the day and she attaches it to her CPAP at night. She has been taking the oxygen off briefly to shower or use the bathroom. However she has felt more and more SOB in the last few weeks. No chest pain. Her resting O2 sats are around 90%, but these will drop into the 60's or 70's when she takes the O2 off. She is scheduled to see Mr. Bonnie Gonzalez again on 09-03-21. There is a question as to whether she also has CHF, and cannot find evidence of this in her chart. She does take 40 mg od Lasix a day for ankle swelling. Her last ECHO in February 2020 showed an EF of 60-65% and mild (likely what we would now call Grade I) diastolic dysfunction. Her last BNP in March was 20. Her renal function is normal. Her last creatinine in March was 0.66 with a GFR of 90. She is scheduled to see Dr Bonnie Gonzalez for a Cardiology evaluation on 09-03-21. Bonnie Gonzalez using Trelegy daily. Her increasing SOB has been very upsetting to her and she fears that her breathing will get worse.    Review of Systems  Constitutional: Negative.   Respiratory:  Positive for shortness of breath. Negative for cough.   Cardiovascular:  Positive for leg swelling. Negative for chest pain and palpitations.      Objective:   Physical Exam Constitutional:      Appearance: She is obese.     Comments: Wearing [place] oxygen   Cardiovascular:     Rate and Rhythm: Normal rate and regular rhythm.     Pulses: Normal pulses.     Heart sounds: Normal heart sounds.  Pulmonary:     Effort: Pulmonary effort is normal.     Breath sounds: No rhonchi or rales.     Comments: Scattered wheezes  Musculoskeletal:      Comments: 1+ edema in both ankles   Neurological:     Mental Status: She is alert.  Psychiatric:     Comments: She is tearful and anxious           Assessment & Plan:  She has worsening SOB, and I think the emphysema is by far the main etiology of this. If she has any CHF, this seems to be mild. She will see Dr, Bonnie Gonzalez as scheduled, but we will try to get her in to see Pulmonology as quickly as possible. In the meantime we will increase her O2 flow to 3 liters.  We spent 35 minutes reviewing records and discussing these issues.  Bonnie Penna, MD

## 2021-08-01 DIAGNOSIS — I700 Atherosclerosis of aorta: Secondary | ICD-10-CM | POA: Diagnosis not present

## 2021-08-01 DIAGNOSIS — J439 Emphysema, unspecified: Secondary | ICD-10-CM | POA: Diagnosis not present

## 2021-08-01 DIAGNOSIS — J432 Centrilobular emphysema: Secondary | ICD-10-CM | POA: Diagnosis not present

## 2021-08-01 DIAGNOSIS — J9611 Chronic respiratory failure with hypoxia: Secondary | ICD-10-CM | POA: Diagnosis not present

## 2021-08-01 DIAGNOSIS — Z87891 Personal history of nicotine dependence: Secondary | ICD-10-CM | POA: Diagnosis not present

## 2021-08-01 DIAGNOSIS — G4733 Obstructive sleep apnea (adult) (pediatric): Secondary | ICD-10-CM | POA: Diagnosis not present

## 2021-08-01 DIAGNOSIS — Z9989 Dependence on other enabling machines and devices: Secondary | ICD-10-CM | POA: Diagnosis not present

## 2021-08-01 DIAGNOSIS — Z9981 Dependence on supplemental oxygen: Secondary | ICD-10-CM | POA: Diagnosis not present

## 2021-08-13 DIAGNOSIS — E1165 Type 2 diabetes mellitus with hyperglycemia: Secondary | ICD-10-CM | POA: Diagnosis not present

## 2021-08-13 DIAGNOSIS — Z79899 Other long term (current) drug therapy: Secondary | ICD-10-CM | POA: Diagnosis not present

## 2021-08-13 DIAGNOSIS — E8881 Metabolic syndrome: Secondary | ICD-10-CM | POA: Diagnosis not present

## 2021-08-13 DIAGNOSIS — I10 Essential (primary) hypertension: Secondary | ICD-10-CM | POA: Diagnosis not present

## 2021-08-13 DIAGNOSIS — E1140 Type 2 diabetes mellitus with diabetic neuropathy, unspecified: Secondary | ICD-10-CM | POA: Diagnosis not present

## 2021-08-13 DIAGNOSIS — Z794 Long term (current) use of insulin: Secondary | ICD-10-CM | POA: Diagnosis not present

## 2021-08-14 DIAGNOSIS — I700 Atherosclerosis of aorta: Secondary | ICD-10-CM | POA: Diagnosis not present

## 2021-08-14 DIAGNOSIS — J398 Other specified diseases of upper respiratory tract: Secondary | ICD-10-CM | POA: Diagnosis not present

## 2021-08-14 DIAGNOSIS — R0602 Shortness of breath: Secondary | ICD-10-CM | POA: Diagnosis not present

## 2021-08-14 DIAGNOSIS — R918 Other nonspecific abnormal finding of lung field: Secondary | ICD-10-CM | POA: Diagnosis not present

## 2021-08-14 DIAGNOSIS — K7460 Unspecified cirrhosis of liver: Secondary | ICD-10-CM | POA: Diagnosis not present

## 2021-08-14 DIAGNOSIS — I2510 Atherosclerotic heart disease of native coronary artery without angina pectoris: Secondary | ICD-10-CM | POA: Diagnosis not present

## 2021-08-24 DIAGNOSIS — J432 Centrilobular emphysema: Secondary | ICD-10-CM | POA: Diagnosis not present

## 2021-08-24 DIAGNOSIS — J449 Chronic obstructive pulmonary disease, unspecified: Secondary | ICD-10-CM | POA: Diagnosis not present

## 2021-08-24 DIAGNOSIS — J9611 Chronic respiratory failure with hypoxia: Secondary | ICD-10-CM | POA: Diagnosis not present

## 2021-08-27 ENCOUNTER — Encounter: Payer: Self-pay | Admitting: Gastroenterology

## 2021-09-03 ENCOUNTER — Other Ambulatory Visit: Payer: Self-pay

## 2021-09-03 ENCOUNTER — Ambulatory Visit (INDEPENDENT_AMBULATORY_CARE_PROVIDER_SITE_OTHER): Payer: PPO | Admitting: Cardiology

## 2021-09-03 ENCOUNTER — Encounter: Payer: Self-pay | Admitting: Cardiology

## 2021-09-03 ENCOUNTER — Telehealth: Payer: Self-pay

## 2021-09-03 VITALS — BP 126/60 | HR 78 | Ht 60.0 in | Wt 268.6 lb

## 2021-09-03 DIAGNOSIS — R072 Precordial pain: Secondary | ICD-10-CM

## 2021-09-03 DIAGNOSIS — Z87891 Personal history of nicotine dependence: Secondary | ICD-10-CM | POA: Diagnosis not present

## 2021-09-03 DIAGNOSIS — Z9981 Dependence on supplemental oxygen: Secondary | ICD-10-CM | POA: Diagnosis not present

## 2021-09-03 DIAGNOSIS — R9431 Abnormal electrocardiogram [ECG] [EKG]: Secondary | ICD-10-CM | POA: Diagnosis not present

## 2021-09-03 DIAGNOSIS — I10 Essential (primary) hypertension: Secondary | ICD-10-CM

## 2021-09-03 DIAGNOSIS — R0609 Other forms of dyspnea: Secondary | ICD-10-CM | POA: Diagnosis not present

## 2021-09-03 DIAGNOSIS — J8410 Pulmonary fibrosis, unspecified: Secondary | ICD-10-CM | POA: Diagnosis not present

## 2021-09-03 DIAGNOSIS — Z9989 Dependence on other enabling machines and devices: Secondary | ICD-10-CM | POA: Diagnosis not present

## 2021-09-03 DIAGNOSIS — C787 Secondary malignant neoplasm of liver and intrahepatic bile duct: Secondary | ICD-10-CM

## 2021-09-03 DIAGNOSIS — G4733 Obstructive sleep apnea (adult) (pediatric): Secondary | ICD-10-CM | POA: Diagnosis not present

## 2021-09-03 DIAGNOSIS — J432 Centrilobular emphysema: Secondary | ICD-10-CM | POA: Diagnosis not present

## 2021-09-03 DIAGNOSIS — J9611 Chronic respiratory failure with hypoxia: Secondary | ICD-10-CM | POA: Diagnosis not present

## 2021-09-03 DIAGNOSIS — R079 Chest pain, unspecified: Secondary | ICD-10-CM | POA: Diagnosis not present

## 2021-09-03 DIAGNOSIS — I5032 Chronic diastolic (congestive) heart failure: Secondary | ICD-10-CM

## 2021-09-03 NOTE — Addendum Note (Signed)
Addended by: [name] A on: 09/03/2021 01:15 PM   Modules accepted: Orders

## 2021-09-03 NOTE — Telephone Encounter (Signed)
Spoke with pt regarding her referral for MRI of the liver, pt requested for referral be sent to another imaging center,Spoke with the office referral coordinator who sent the referral to [facility], Left detailed message for pt advising to call the [facility] for screening / scheduling.

## 2021-09-03 NOTE — Addendum Note (Signed)
Addended by: Antonieta Iba on: 09/03/2021 11:58 AM   Modules accepted: Orders

## 2021-09-03 NOTE — Patient Instructions (Addendum)
Medication Instructions:  Your physician recommends that you continue on your current medications as directed. Please refer to the Current Medication list given to you today.  *If you need a refill on your cardiac medications before your next appointment, please call your pharmacy*   Lab Work: TODAY: BMET, CBC, TSH, and BNP If you have labs (blood work) drawn today and your tests are completely normal, you will receive your results only by: [place] (if you have MyChart) OR A paper copy in the mail If you have any lab test that is abnormal or we need to change your treatment, we will call you to review the results.   Testing/Procedures: Your physician has requested that you have an echocardiogram. Echocardiography is a painless test that uses sound waves to create images of your heart. It provides your doctor with information about the size and shape of your heart and how well your heart's chambers and valves are working. This procedure takes approximately one hour. There are no restrictions for this procedure.  Your physician has requested that you have a Coronary CTA scan. Please see below for further information.   Follow-Up: At [facility], you and your health needs are our priority.  As part of our continuing mission to provide you with exceptional heart care, we have created designated Provider Care Teams.  These Care Teams include your primary Cardiologist (physician) and Advanced Practice Providers (APPs -  Physician Assistants and Nurse Practitioners) who all work together to provide you with the care you need, when you need it.  Follow up with Dr. Radford Pax based on results of testing.    Other Instructions   Your cardiac CT will be scheduled at:  [facility] [address] [place], [place] [address] [phone]  Please arrive at the [facility] main entrance (entrance A) of [facility] 30 minutes prior to test start time. Proceed to the  [facility] Radiology Department (first floor) to check-in and test prep.  Please follow these instructions carefully (unless otherwise directed):  On the Night Before the Test: Be sure to Drink plenty of water. Do not consume any caffeinated/decaffeinated beverages or chocolate 12 hours prior to your test. Do not take any antihistamines 12 hours prior to your test.  On the Day of the Test: Drink plenty of water until 1 hour prior to the test. Do not eat any food 4 hours prior to the test. You may take your regular medications prior to the test.  Take metoprolol (Lopressor) two hours prior to test. HOLD Furosemide morning of the test. FEMALES- please wear underwire-free bra if available, avoid dresses & tight clothing      After the Test: Drink plenty of water. After receiving IV contrast, you may experience a mild flushed feeling. This is normal. On occasion, you may experience a mild rash up to 24 hours after the test. This is not dangerous. If this occurs, you can take Benadryl 25 mg and increase your fluid intake. If you experience trouble breathing, this can be serious. If it is severe call 911 IMMEDIATELY. If it is mild, please call our office. If you take any of these medications: Glipizide/Metformin, Avandament, Glucavance, please do not take 48 hours after completing test unless otherwise instructed.  Please allow 2-4 weeks for scheduling of routine cardiac CTs. Some insurance companies require a pre-authorization which may delay scheduling of this test.   For non-scheduling related questions, please contact the cardiac imaging nurse navigator should you have  any questions/concerns: Marchia Bond, Cardiac Imaging Nurse Navigator Gordy Clement, Cardiac Imaging Nurse Navigator [facility] Direct Office Dial: [phone]   For scheduling needs, including cancellations and rescheduling, please call [place], [phone].

## 2021-09-03 NOTE — Progress Notes (Signed)
Cardiology CONSULT Note    Date:  09/03/2021   ID:  Bonnie Gonzalez Jul 01, 1956, MRN [id number]  PCP:  Bonnie Morale, MD  Cardiologist:  Bonnie Him, MD   Chief Complaint  Patient presents with   Congestive Heart Failure   Hypertension     History of Present Illness:  Bonnie Gonzalez is a 65 y.o. female who is being seen today for the evaluation of chronic diastolic CHF at the request of Bonnie Morale, MD.  This is a 65yo obese female with a hx of emphysema/COPD, chronic diastolic CHF, remote hx of tobacco use, chronic DOE and HTN who is referred today for evaluation and treatment of chronic diastolic CHF.  2D echo 2020 showed normal LVF with EF 60-65% with G1DD and no valvular heart disease.    She is now referred by her PCP to establish care with Cardiology.  She recently started having LE edema several months ago.  She also has been having worsening DOE.  She attributed it to weight gain but has gotten to the point that walking very short distances is very difficult.  She uses a walker to ambulate.  She recently went up to 3L O2.  She uses O2 and CPAP at night.  She is very sedentary due to her significant lung disease. She recently started to cut back on her Na intake and has lost some weight and also the LE edema has improved but her SOB has not improved.  Recent HRCT scan showed new low attenuation lesions in the liver concerning for hepatocellular CA or metastatic CA with cirrhosis.    She has had some problems with chest pain that is sharp that is sporadic with radiation into her neck and her neck will ache.  She has had some problems with diaphoresis and nausea but cannot relate whether it was related to the CP.  She uses CPAP at night but denies PND, orthopnea.  She has had some dizziness at times.  She denies any palpitations or syncope. She is compliant with her meds and is tolerating meds with no SE.     Past Medical History:  Diagnosis Date   Acquired  hypothyroidism 10/01/2010   Allergic rhinitis 07/20/2009   Arthritis    Back pain 10/18/2015   Bilateral leg edema 03/31/2018   Centrilobular emphysema 02/04/2019   HRCT 09/15/18   Chronic diastolic CHF (congestive heart failure) 01/28/2019   Chronic respiratory failure with hypoxia 02/04/2019   6 Min Walk 08/31/18 at [facility]   Chronic urticaria 12/21/2019   COPD (chronic obstructive pulmonary disease) 09/01/2018   Quit smoking 2010  - Spirometry 08/31/2018  FEV1 1.1 (51%)  Ratio 66 s prior rx with typical curvature  - 08/31/2018  After extensive coaching inhaler device,  effectiveness =    90% with elipta > try anoro sample > no better so d/c'  - PFT's  11/10/2018  FEV1 1.24 (59 % ) ratio 75 (ratio with fev1/VC =  63)  p no % improvement from saba p nothing prior to study with DLCO  105  % corrects to 132  %    DOE (dyspnea on exertion) 08/31/2018   Onset 05/2018 with background of unexplained leg swelling x 01/2018   -echo 04/13/18 just G1   diastolic dysfunction s PH   -  08/31/2018   Walked RA x one lap @ 185 stopped due to  Sob/ sats 89% at nl pace  - Spirometry 08/31/2018  FEV1 1.1 (  51%)  Ratio 66 s prior rx  - 09/28/2018 could not do full pfts 09/28/2018  - Collagen vasc profile 09/28/18  For ESR =  54 (was 102 before prednisone)  Neg collag   Dyshidrotic eczema 12/13/2010   Essential hypertension 01/26/2019   Generalized anxiety disorder with panic attacks 01/20/2018   GERD (gastroesophageal reflux disease) 11/03/2007   Headache 11/03/2007   Irritable bowel syndrome 07/13/2009   Major depressive disorder 07/17/2020   Mixed hyperlipidemia 07/20/2009   Obesity    OSA (obstructive sleep apnea) 07/26/2015   HST 08/2015 mild OSA, AHI 8/hour, lowest desaturation 81%, desaturations noted without respiratory events  Formatting of this note might be different from the original. PSG 05/20/19 AHI 25.1   Osteopenia of multiple sites 06/01/2019   DEXA 05/24/2019: Right femur neck T- 2.2, left  femur neck T- 1.5, right total femur T -0.5, left total femur T -0.5, AP total spine T- 1.9. FRAX 10-year probability of major osteoporotic fracture is 8.8% and a hip fracture is 1.2%  DEXA 05/05/2019: Right femur neck T- 1.9, left femur neck T- 2.0, right total femur T -1.3, left total femur   Oxygen deficiency    Postinflammatory pulmonary fibrosis 09/15/2018   ESR    10/161/9  = 102   > rx short term prednisone ? slt improvement in symptoms HRCT 09/15/2018  1. Very mild basilar subpleural ground-glass, reticulation and traction bronchiolectasis, findings which may be minimally progressive from 06/29/2017 and are indicative of interstitial lung disease such as nonspecific interstitial pneumonitis. Findings are indeterminate for UIP per consensus guidelin   Psychophysiological insomnia 03/29/2019   Type 2 diabetes mellitus 02/09/2017   UTI (urinary tract infection)    Vertigo 03/29/2014   Vitamin D deficiency 09/15/2020    Past Surgical History:  Procedure Laterality Date   ANKLE FRACTURE SURGERY Right    benign tumor      removed from groin   WRIST ARTHROCENTESIS N/A 2001    Current Medications: Current Meds  Medication Sig   albuterol (PROVENTIL HFA;VENTOLIN HFA) 108 (90 Base) MCG/ACT inhaler Inhale 2 puffs into the lungs every 4 (four) hours as needed for wheezing or shortness of breath.   ALPRAZolam (XANAX) 0.5 MG tablet TAKE 1 TABLET (0.5 MG TOTAL) BY MOUTH 2 (TWO) TIMES DAILY AS NEEDED. (Patient taking differently: Take 0.25 mg by mouth See admin instructions. Take every night and daily as needed for anxiety)   atorvastatin (LIPITOR) 40 MG tablet Take 1 tablet (40 mg total) by mouth daily.   azelastine (ASTELIN) 0.1 % nasal spray Place into the nose.   benzonatate (TESSALON) 200 MG capsule Take 1 capsule (200 mg total) by mouth 2 (two) times daily as needed for cough.   Cholecalciferol 50 MCG (2000 UT) CAPS Take 1 capsule by mouth daily.   Continuous Blood Gluc Sensor (FREESTYLE  LIBRE 14 DAY SENSOR) MISC by Does not apply route.   cyanocobalamin 1000 MCG tablet Take 1 tablet by mouth daily.   donepezil (ARICEPT) 10 MG tablet TAKE 1 TABLET BY MOUTH AT BEDTIME (Patient taking differently: Take 10 mg by mouth at bedtime.)   DULoxetine (CYMBALTA) 60 MG capsule Take 1 capsule (60 mg total) by mouth daily. (Patient taking differently: Take 60 mg by mouth at bedtime.)   esomeprazole (NEXIUM) 40 MG capsule Take 30- 60 min before your first and last meals of the day (Patient taking differently: Take 40 mg by mouth daily before breakfast. Take 1 capsule by mouth daily)   eszopiclone (  LUNESTA) 2 MG TABS tablet Take 2 mg by mouth once.   fluconazole (DIFLUCAN) 150 MG tablet Take 1 tablet po x 1 and repeat in 3 days   Fluticasone-Umeclidin-Vilant (TRELEGY ELLIPTA) 100-62.5-25 MCG/INH AEPB Inhale into the lungs. Inhale 1 puff into lungs daily for 30 days.   furosemide (LASIX) 40 MG tablet Take 1 tablet (40 mg total) by mouth daily.   glucose 4 GM chewable tablet Chew 1 tablet (4 g total) by mouth as needed for low blood sugar (< 70).   halobetasol (ULTRAVATE) 0.05 % cream Apply topically 2 (two) times daily as needed. (Patient taking differently: Apply 1 application topically 2 (two) times daily as needed (for itching).)   hydrocortisone 2.5 % ointment Apply topically.   hydrOXYzine (VISTARIL) 25 MG capsule Take 1 capsule (25 mg total) by mouth See admin instructions. Take 1 capsule by mouth three times daily as needed.   Hyprom-Naphaz-Polysorb-Zn Sulf (CLEAR EYES COMPLETE) SOLN Place 1-2 drops into both eyes as needed (for itching).   Insulin Degludec 200 UNIT/ML SOPN Inject 80 Units into the skin every morning. Inject 80 units into the skin in the morning and at bedtime.   Insulin Pen Needle (B-D UF III MINI PEN NEEDLES) 31G X 5 MM MISC Use to administer insulin five times a day   levothyroxine (SYNTHROID) 175 MCG tablet TAKE 1 TABLET BY MOUTH ONCE DAILY BEFORE BREAKFAST   meclizine  (ANTIVERT) 50 MG tablet Take 0.5 tablets (25 mg total) by mouth 3 (three) times daily as needed. (Patient taking differently: Take by mouth. Take 25 mg by mouth prn)   methocarbamol (ROBAXIN) 500 MG tablet Take 1 tablet (500 mg total) by mouth every 6 (six) hours as needed for muscle spasms.   naproxen (NAPROSYN) 500 MG tablet Take 500 mg by mouth 2 (two) times daily.    nystatin (MYCOSTATIN/NYSTOP) powder Apply thin layer to rash on lower abdomen BID until rash resolves then once a day   ondansetron (ZOFRAN) 4 MG tablet Take 4 mg by mouth every 8 (eight) hours as needed for nausea or vomiting.   Probiotic Product (PROBIOTIC-10 PO) Take by mouth.   Tiotropium Bromide-Olodaterol 2.5-2.5 MCG/ACT AERS Inhale 2.5 mcg into the lungs daily.    Allergies:   Aspirin, Hydrocodone-acetaminophen, Oxycodone-acetaminophen, Penicillins, Codeine, Fluoxetine hcl, Hydrocodone-acetaminophen, Oxycodone-acetaminophen, Fluoxetine hcl, and Trazodone   Social History   Socioeconomic History   Marital status: Divorced    Spouse name: Not on file   Number of children: 4   Years of education: 13   Highest education level: Some college, no degree  Occupational History   Not on file  Tobacco Use   Smoking status: Former    Packs/day: 1.00    Years: 35.00    Pack years: 35.00    Types: Cigarettes    Quit date: 12/01/2008    Years since quitting: 12.7   Smokeless tobacco: Never  Vaping Use   Vaping Use: Never used  Substance and Sexual Activity   Alcohol use: Not Currently   Drug use: No   Sexual activity: Not Currently  Other Topics Concern   Not on file  Social History Narrative   Lives alone.   She has four grown children.   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   Highest level of education:  1.5 years of college      Right Handed   Is on 2 L of oxygen    Social Determinants of Health   Financial  Resource Strain: Not on file  Food Insecurity: Not on file   Transportation Needs: Not on file  Physical Activity: Not on file  Stress: Not on file  Social Connections: Not on file     Family History:  The patient's family history includes Alzheimer's disease in her father; Arthritis/Rheumatoid in her mother; Asthma in an other family member; Dementia in her father; Depression in an other family member; Diabetes in an other family member; Diabetes Mellitus II in her brother, father, and sister; Lupus in an other family member; Parkinsonism in an other family member.   ROS:   Please see the history of present illness.    ROS All other systems reviewed and are negative.  No flowsheet data found.   PHYSICAL EXAM:   VS:  BP 126/60   Pulse 78   Ht 5' (1.524 m)   Wt 268 lb 9.6 oz (121.8 kg)   SpO2 90% Comment: On 2L oxygen  BMI 52.46 kg/m    GEN: Well nourished, well developed, in no acute distress  HEENT: normal  Neck: no JVD, carotid bruits, or masses Cardiac: RRR; no murmurs, rubs, or gallops,no edema.  Intact distal pulses bilaterally.  Respiratory:  clear to auscultation bilaterally, normal work of breathing GI: soft, nontender, nondistended, + BS MS: no deformity or atrophy  Skin: warm and dry, no rash Neuro:  Alert and Oriented x 3, Strength and sensation are intact Psych: euthymic mood, full affect  Wt Readings from Last 3 Encounters:  09/03/21 268 lb 9.6 oz (121.8 kg)  07/31/21 269 lb (122 kg)  06/14/21 270 lb 9.6 oz (122.7 kg)      Studies/Labs Reviewed:   EKG:  EKG is ordered today.  The ekg ordered today demonstrates NSR with inferior and septal infarct  Recent Labs: No results found for requested labs within last 8760 hours.   Lipid Panel    Component Value Date/Time   CHOL 254 (H) 04/10/2021 0716   TRIG 215.0 (H) 04/10/2021 0716   HDL 43.90 04/10/2021 0716   CHOLHDL 6 04/10/2021 0716   VLDL 43.0 (H) 04/10/2021 0716   LDLCALC 75 02/03/2017 0914   LDLDIRECT 176.0 04/10/2021 0716    Additional studies/ records  that were reviewed today include:  OV notes from PCP    ASSESSMENT:    1. Chronic diastolic CHF (congestive heart failure)   2. DOE (dyspnea on exertion)   3. Essential hypertension   4. Nonspecific abnormal electrocardiogram (ECG) (EKG)   5. Chest pain of uncertain etiology      PLAN:  In order of problems listed above:  Chronic diastolic CHF/SOB -her last echo was 2020 which showed normal LVF with G1DD -she has had worsening SOB and difficult to sort out whether related to progressive lung disease  -? Whether she could have PHTN with cor pulmonale resulting in right sided HF sx -she really does not appear volume overloaded on exam today -I will check a BNP, BMET,TSH and CBC to try to determine etiology of her SOB -suspect her SOB is related to morbid obesity, deconditioning, sedentary state and COPD -check 2D echo to assess LVF -continue prescription drug management with Lasix 12m daily  2.  HTN -BP is well controlled on exam today -diet controlled  3.  Abnormal EKG -EKG shows prior inferior and septal infarct -she has CP that may be related to her COPD but she has a hx of tobacco use and with her worsening SOB, CP and abnormal EKG I think  we need to rule out CAD -would try to avoid Leane Call given her underlying significant COPD and she cannot walk on treadmill -will get a coronary CTA to define coronary anatomy  Time Spent: 25 minutes total time of encounter, including 15 minutes spent in face-to-face patient care on the date of this encounter. This time includes coordination of care and counseling regarding above mentioned problem list. Remainder of non-face-to-face time involved reviewing chart documents/testing relevant to the patient encounter and documentation in the medical record. I have independently reviewed documentation from referring provider  Medication Adjustments/Labs and Tests Ordered: Current medicines are reviewed at length with the patient  today.  Concerns regarding medicines are outlined above.  Medication changes, Labs and Tests ordered today are listed in the Patient Instructions below.  There are no Patient Instructions on file for this visit.   Signed, Bonnie Him, MD  09/03/2021 11:52 AM    [facility] [place], [place], [place]  [address] Phone: [phone]; Fax: [phone]

## 2021-09-03 NOTE — Telephone Encounter (Signed)
I reviewed the CT report, and I ordered an MRI scan of the liver

## 2021-09-03 NOTE — Telephone Encounter (Signed)
Message was sent to DR Sarajane Jews for advise

## 2021-09-04 LAB — BASIC METABOLIC PANEL
BUN/Creatinine Ratio: 13 (ref 12–28)
BUN: 12 mg/dL (ref 8–27)
CO2: 31 mmol/L — ABNORMAL HIGH (ref 20–29)
Calcium: 10.2 mg/dL (ref 8.7–10.3)
Chloride: 95 mmol/L — ABNORMAL LOW (ref 96–106)
Creatinine, Ser: 0.92 mg/dL (ref 0.57–1.00)
Glucose: 226 mg/dL — ABNORMAL HIGH (ref 70–99)
Potassium: 4.5 mmol/L (ref 3.5–5.2)
Sodium: 139 mmol/L (ref 134–144)
eGFR: 69 mL/min/{1.73_m2} (ref >59)

## 2021-09-04 LAB — CBC
Hematocrit: 40.6 % (ref 34.0–46.6)
Hemoglobin: 12.8 g/dL (ref 11.1–15.9)
MCH: 26.2 pg — ABNORMAL LOW (ref 26.6–33.0)
MCHC: 31.5 g/dL (ref 31.5–35.7)
MCV: 83 fL (ref 79–97)
Platelets: 208 10*3/uL (ref 150–450)
RBC: 4.89 x10E6/uL (ref 3.77–5.28)
RDW: 13.5 % (ref 11.7–15.4)
WBC: 11 10*3/uL — ABNORMAL HIGH (ref 3.4–10.8)

## 2021-09-04 LAB — TSH: TSH: 3.11 u[IU]/mL (ref 0.450–4.500)

## 2021-09-04 LAB — PRO B NATRIURETIC PEPTIDE: NT-Pro BNP: 9 pg/mL (ref 0–301)

## 2021-09-04 NOTE — Telephone Encounter (Signed)
Pt CT report was received via fax and was placed in Dr Jennings Books folder for review

## 2021-09-11 ENCOUNTER — Telehealth: Payer: Self-pay | Admitting: Family Medicine

## 2021-09-11 NOTE — Telephone Encounter (Signed)
Pease advise.

## 2021-09-11 NOTE — Telephone Encounter (Signed)
New, an RN from [facility] recently spoke with the patient who states that the patient mentioned having physical therapy through home health services.   She says that the patient has had some outpatient physical therapy before but would like to try home physical therapy if possible.  She says that she explained to the patient that that is something Dr. Sarajane Jews would have to put an order in for and that she will relay the message to the office.  Patient would like a call at [phone] once Dr. Sarajane Jews has decided if home PT is an option for her.  Please advise.

## 2021-09-11 NOTE — Telephone Encounter (Signed)
That sounds like a good idea. This will need to be through [facility]. Please refer her to [facility] for at home PT for general conditioning

## 2021-09-12 ENCOUNTER — Other Ambulatory Visit: Payer: Self-pay

## 2021-09-12 DIAGNOSIS — J9611 Chronic respiratory failure with hypoxia: Secondary | ICD-10-CM

## 2021-09-13 ENCOUNTER — Telehealth: Payer: Self-pay | Admitting: *Deleted

## 2021-09-13 NOTE — Telephone Encounter (Signed)
Hi Dr Silverio Decamp, I am preparing paperwork for a pre visit form [name].  Her BMI is 52.4.  Would you like her to have an office visit first, or direct to [place]? Thank you- Raquel Sarna

## 2021-09-13 NOTE — Telephone Encounter (Signed)
Spoke with pt verbalized understanding that a Referral for Home health PT has been placed and she will get a call to schedule

## 2021-09-14 NOTE — Telephone Encounter (Signed)
Please schedule office visit to discuss colorectal cancer screening with any next available provider given she is not established with anybody yet. Thanks

## 2021-09-16 ENCOUNTER — Encounter (HOSPITAL_COMMUNITY): Payer: Self-pay

## 2021-09-16 NOTE — Telephone Encounter (Signed)
Appointments canceled.  Called patient and left voicemail to call back to schedule office visit.

## 2021-09-17 ENCOUNTER — Encounter: Payer: Self-pay | Admitting: Family Medicine

## 2021-09-17 ENCOUNTER — Encounter: Payer: Self-pay | Admitting: Gastroenterology

## 2021-09-23 ENCOUNTER — Other Ambulatory Visit: Payer: Self-pay

## 2021-09-23 ENCOUNTER — Ambulatory Visit (HOSPITAL_COMMUNITY): Payer: HMO | Attending: Cardiovascular Disease

## 2021-09-23 DIAGNOSIS — R079 Chest pain, unspecified: Secondary | ICD-10-CM | POA: Diagnosis not present

## 2021-09-23 DIAGNOSIS — J9611 Chronic respiratory failure with hypoxia: Secondary | ICD-10-CM | POA: Diagnosis not present

## 2021-09-23 DIAGNOSIS — I5032 Chronic diastolic (congestive) heart failure: Secondary | ICD-10-CM

## 2021-09-23 DIAGNOSIS — J449 Chronic obstructive pulmonary disease, unspecified: Secondary | ICD-10-CM | POA: Diagnosis not present

## 2021-09-23 DIAGNOSIS — R9431 Abnormal electrocardiogram [ECG] [EKG]: Secondary | ICD-10-CM

## 2021-09-23 DIAGNOSIS — I10 Essential (primary) hypertension: Secondary | ICD-10-CM

## 2021-09-23 DIAGNOSIS — R072 Precordial pain: Secondary | ICD-10-CM | POA: Diagnosis not present

## 2021-09-23 DIAGNOSIS — R0609 Other forms of dyspnea: Secondary | ICD-10-CM | POA: Diagnosis not present

## 2021-09-23 DIAGNOSIS — J432 Centrilobular emphysema: Secondary | ICD-10-CM | POA: Diagnosis not present

## 2021-09-23 LAB — ECHOCARDIOGRAM COMPLETE
Area-P 1/2: 3.37 cm2
S' Lateral: 2.5 cm

## 2021-09-24 ENCOUNTER — Other Ambulatory Visit: Payer: Self-pay

## 2021-09-24 ENCOUNTER — Telehealth (HOSPITAL_COMMUNITY): Payer: Self-pay | Admitting: Emergency Medicine

## 2021-09-24 MED ORDER — METOPROLOL TARTRATE 100 MG PO TABS: 100.0000 mg | ORAL_TABLET | Freq: Once | ORAL | 0 refills | Status: DC

## 2021-09-24 NOTE — Telephone Encounter (Signed)
Reaching out to patient to offer assistance regarding upcoming cardiac imaging study; pt verbalizes understanding of appt date/time, parking situation and where to check in, pre-test NPO status and medications ordered, and verified current allergies; name and call back number provided for further questions should they arise Marchia Bond RN Navigator Cardiac Imaging [name] [phone] office [phone] cell  100mg  metoprolol  Holding lasix Difficult IV start

## 2021-09-25 ENCOUNTER — Other Ambulatory Visit: Payer: Self-pay

## 2021-09-25 ENCOUNTER — Ambulatory Visit (HOSPITAL_COMMUNITY)
Admission: RE | Admit: 2021-09-25 | Discharge: 2021-09-25 | Disposition: A | Discharge status: Home / Self Care | Payer: HMO | Source: Ambulatory Visit | Attending: Cardiology | Admitting: Cardiology

## 2021-09-25 DIAGNOSIS — R072 Precordial pain: Secondary | ICD-10-CM | POA: Insufficient documentation

## 2021-09-25 DIAGNOSIS — I700 Atherosclerosis of aorta: Secondary | ICD-10-CM | POA: Diagnosis not present

## 2021-09-25 MED ORDER — IOHEXOL 350 MG/ML SOLN
95.0000 mL | Freq: Once | INTRAVENOUS | Status: AC | PRN
  Administered 2021-09-25: 95 mL via INTRAVENOUS

## 2021-09-25 MED ORDER — NITROGLYCERIN 0.4 MG SL SUBL
SUBLINGUAL_TABLET | SUBLINGUAL | Status: AC
  Filled 2021-09-25: qty 2

## 2021-09-25 MED ORDER — NITROGLYCERIN 0.4 MG SL SUBL
0.8000 mg | SUBLINGUAL_TABLET | Freq: Once | SUBLINGUAL | Status: AC
  Administered 2021-09-25: 0.8 mg via SUBLINGUAL

## 2021-09-27 ENCOUNTER — Telehealth: Payer: Self-pay

## 2021-09-27 DIAGNOSIS — I5032 Chronic diastolic (congestive) heart failure: Secondary | ICD-10-CM

## 2021-09-27 DIAGNOSIS — R0609 Other forms of dyspnea: Secondary | ICD-10-CM

## 2021-09-27 MED ORDER — DILTIAZEM HCL ER COATED BEADS 120 MG PO CP24: 120.0000 mg | ORAL_CAPSULE | Freq: Every day | ORAL | 3 refills | Status: DC

## 2021-09-27 NOTE — Telephone Encounter (Signed)
-----   Message from [name], MD sent at 09/26/2021 11:33 AM EDT ----- Coronary CTA showed a coronary calcium score of 10 with minimal nonobstructive CAD with 1 to 24% noncalcified plaque in the LAD as well as a small myocardial bridge in the mid LAD.  Please add Cardizem CD 120 mg daily to see if that helps given her myocardial bridging.  Follow-up with extender in 4 weeks.  Have her come in for repeat FLP and ALT.  She cannot take aspirin due to history of allergy

## 2021-09-27 NOTE — Telephone Encounter (Signed)
The patient has been notified of the result and verbalized understanding.  All questions (if any) were answered. Antonieta Iba, RN 09/27/2021 4:15 PM  Rx has been sent in. Labs have been scheduled. Follow up has been scheduled.

## 2021-09-28 ENCOUNTER — Other Ambulatory Visit: Payer: Self-pay

## 2021-09-28 ENCOUNTER — Ambulatory Visit
Admission: RE | Admit: 2021-09-28 | Discharge: 2021-09-28 | Disposition: A | Discharge status: Home / Self Care | Payer: PPO | Source: Ambulatory Visit | Attending: Family Medicine | Admitting: Family Medicine

## 2021-09-28 DIAGNOSIS — R160 Hepatomegaly, not elsewhere classified: Secondary | ICD-10-CM | POA: Diagnosis not present

## 2021-09-28 DIAGNOSIS — C787 Secondary malignant neoplasm of liver and intrahepatic bile duct: Secondary | ICD-10-CM

## 2021-09-28 DIAGNOSIS — K7460 Unspecified cirrhosis of liver: Secondary | ICD-10-CM | POA: Diagnosis not present

## 2021-09-28 DIAGNOSIS — K760 Fatty (change of) liver, not elsewhere classified: Secondary | ICD-10-CM | POA: Diagnosis not present

## 2021-09-28 MED ORDER — GADOBENATE DIMEGLUMINE 529 MG/ML IV SOLN
20.0000 mL | Freq: Once | INTRAVENOUS | Status: AC | PRN
  Administered 2021-09-28: 20 mL via INTRAVENOUS

## 2021-09-30 ENCOUNTER — Other Ambulatory Visit: Payer: HMO | Admitting: *Deleted

## 2021-09-30 ENCOUNTER — Other Ambulatory Visit: Payer: Self-pay

## 2021-09-30 ENCOUNTER — Other Ambulatory Visit: Payer: Self-pay | Admitting: *Deleted

## 2021-09-30 DIAGNOSIS — E782 Mixed hyperlipidemia: Secondary | ICD-10-CM | POA: Diagnosis not present

## 2021-09-30 DIAGNOSIS — I5032 Chronic diastolic (congestive) heart failure: Secondary | ICD-10-CM | POA: Diagnosis not present

## 2021-09-30 DIAGNOSIS — I10 Essential (primary) hypertension: Secondary | ICD-10-CM

## 2021-09-30 DIAGNOSIS — R0609 Other forms of dyspnea: Secondary | ICD-10-CM

## 2021-09-30 LAB — LIPID PANEL
Chol/HDL Ratio: 4.5 ratio — ABNORMAL HIGH (ref 0.0–4.4)
Cholesterol, Total: 147 mg/dL (ref 100–199)
HDL: 33 mg/dL — ABNORMAL LOW (ref >39)
LDL Chol Calc (NIH): 91 mg/dL (ref 0–99)
Triglycerides: 125 mg/dL (ref 0–149)
VLDL Cholesterol Cal: 23 mg/dL (ref 5–40)

## 2021-09-30 LAB — ALT: ALT: 35 IU/L — ABNORMAL HIGH (ref 0–32)

## 2021-10-01 ENCOUNTER — Telehealth: Payer: Self-pay | Admitting: Family Medicine

## 2021-10-01 NOTE — Telephone Encounter (Signed)
Called patient lvm to call back to discuss the tremors that she has been experiencing.

## 2021-10-01 NOTE — Telephone Encounter (Signed)
Left detailed message for pt to call the office regarding her concern with tremors

## 2021-10-01 NOTE — Telephone Encounter (Signed)
Patient is stating that she is still experiencing tremors and it has gotten bad. She is requesting a referral so that she could figure out what's going on.  Patient could be contacted at [phone].  Please advise.

## 2021-10-03 ENCOUNTER — Telehealth: Payer: Self-pay

## 2021-10-03 DIAGNOSIS — I10 Essential (primary) hypertension: Secondary | ICD-10-CM

## 2021-10-03 DIAGNOSIS — E782 Mixed hyperlipidemia: Secondary | ICD-10-CM

## 2021-10-03 MED ORDER — ATORVASTATIN CALCIUM 80 MG PO TABS: 80.0000 mg | ORAL_TABLET | Freq: Every day | ORAL | 3 refills | Status: DC

## 2021-10-03 NOTE — Telephone Encounter (Signed)
-----   Message from [name], MD sent at 09/30/2021  6:10 PM EDT ----- LDL goal < 70 due to coronary calcifications - increase Atorvastatin to 80mg  daily and repeat FLP and ALT in 6 weeks

## 2021-10-03 NOTE — Telephone Encounter (Signed)
The patient has been notified of the result and verbalized understanding.  All questions (if any) were answered. Antonieta Iba, RN 10/03/2021 3:34 PM  Patient will increase atorvastatin. Repeat labs have been scheduled.

## 2021-10-07 DIAGNOSIS — Z79899 Other long term (current) drug therapy: Secondary | ICD-10-CM | POA: Diagnosis not present

## 2021-10-07 DIAGNOSIS — F5104 Psychophysiologic insomnia: Secondary | ICD-10-CM | POA: Diagnosis not present

## 2021-10-07 DIAGNOSIS — F411 Generalized anxiety disorder: Secondary | ICD-10-CM | POA: Diagnosis not present

## 2021-10-07 DIAGNOSIS — F330 Major depressive disorder, recurrent, mild: Secondary | ICD-10-CM | POA: Diagnosis not present

## 2021-10-10 ENCOUNTER — Encounter: Payer: PPO | Admitting: Gastroenterology

## 2021-10-15 ENCOUNTER — Encounter: Payer: Self-pay | Admitting: Gastroenterology

## 2021-10-15 ENCOUNTER — Ambulatory Visit: Payer: HMO | Admitting: Gastroenterology

## 2021-10-15 VITALS — BP 120/74 | HR 76 | Ht 60.0 in | Wt 267.0 lb

## 2021-10-15 DIAGNOSIS — K760 Fatty (change of) liver, not elsewhere classified: Secondary | ICD-10-CM | POA: Diagnosis not present

## 2021-10-15 DIAGNOSIS — Z1211 Encounter for screening for malignant neoplasm of colon: Secondary | ICD-10-CM

## 2021-10-15 DIAGNOSIS — Z6841 Body Mass Index (BMI) 40.0 and over, adult: Secondary | ICD-10-CM

## 2021-10-15 MED ORDER — SUTAB 1479-225-188 MG PO TABS: ORAL_TABLET | ORAL | 0 refills | Status: DC

## 2021-10-15 NOTE — Patient Instructions (Addendum)
You have been scheduled for a Colonoscopy at [facility] on 12/20 at 10:15 am, Separate instructions have been given    If you are age 65 or older, your body mass index should be between 23-30. Your Body mass index is 52.14 kg/m. If this is out of the aforementioned range listed, please consider follow up with your Primary Care Provider.  If you are age 67 or younger, your body mass index should be between 19-25. Your Body mass index is 52.14 kg/m. If this is out of the aformentioned range listed, please consider follow up with your Primary Care Provider.   Low Carb and low fat diet   ________________________________________________________  The [facility] providers would like to encourage you to use [facility] to communicate with providers for non-urgent requests or questions.  Due to long hold times on the telephone, sending your provider a message by [facility] may be a faster and more efficient way to get a response.  Please allow 48 business hours for a response.  Please remember that this is for non-urgent requests.  _______________________________________________________   Due to recent changes in healthcare laws, you may see the results of your imaging and laboratory studies on MyChart before your provider has had a chance to review them.  We understand that in some cases there may be results that are confusing or concerning to you. Not all laboratory results come back in the same time frame and the provider may be waiting for multiple results in order to interpret others.  Please give [name] 48 hours in order for your provider to thoroughly review all the results before contacting the office for clarification of your results.    Heart-Healthy Eating Plan/Low Fat  Heart-healthy meal planning includes: Eating less unhealthy fats. Eating more healthy fats. Making other changes in your diet. Talk with your doctor or a diet specialist (dietitian) to create an eating plan that is right for  you. What is my plan? Your doctor may recommend an eating plan that includes: Total fat: ______% or less of total calories a day. Saturated fat: ______% or less of total calories a day. Cholesterol: less than _________mg a day. What are tips for following this plan? Cooking Avoid frying your food. Try to bake, boil, grill, or broil it instead. You can also reduce fat by: Removing the skin from poultry. Removing all visible fats from meats. Steaming vegetables in water or broth. Meal planning  At meals, divide your plate into four equal parts: Fill one-half of your plate with vegetables and green salads. Fill one-fourth of your plate with whole grains. Fill one-fourth of your plate with lean protein foods. Eat 4-5 servings of vegetables per day. A serving of vegetables is: 1 cup of raw or cooked vegetables. 2 cups of raw leafy greens. Eat 4-5 servings of fruit per day. A serving of fruit is: 1 medium whole fruit.  cup of dried fruit.  cup of fresh, frozen, or canned fruit.  cup of 100% fruit juice. Eat more foods that have soluble fiber. These are apples, broccoli, carrots, beans, peas, and barley. Try to get 20-30 g of fiber per day. Eat 4-5 servings of nuts, legumes, and seeds per week: 1 serving of dried beans or legumes equals  cup after being cooked. 1 serving of nuts is  cup. 1 serving of seeds equals 1 tablespoon. General information Eat more home-cooked food. Eat less restaurant, buffet, and fast food. Limit or avoid alcohol. Limit foods that are high in  starch and sugar. Avoid fried foods. Lose weight if you are overweight. Keep track of how much salt (sodium) you eat. This is important if you have high blood pressure. Ask your doctor to tell you more about this. Try to add vegetarian meals each week. Fats Choose healthy fats. These include olive oil and canola oil, flaxseeds, walnuts, almonds, and seeds. Eat more omega-3 fats. These include salmon, mackerel,  sardines, tuna, flaxseed oil, and ground flaxseeds. Try to eat fish at least 2 times each week. Check food labels. Avoid foods with trans fats or high amounts of saturated fat. Limit saturated fats. These are often found in animal products, such as meats, butter, and cream. These are also found in plant foods, such as palm oil, palm kernel oil, and coconut oil. Avoid foods with partially hydrogenated oils in them. These have trans fats. Examples are stick margarine, some tub margarines, cookies, crackers, and other baked goods. What foods can I eat? Fruits All fresh, canned (in natural juice), or frozen fruits. Vegetables Fresh or frozen vegetables (raw, steamed, roasted, or grilled). Green salads. Grains Most grains. Choose whole wheat and whole grains most of the time. Rice and pasta, including brown rice and pastas made with whole wheat. Meats and other proteins Lean, well-trimmed beef, veal, pork, and lamb. Chicken and [place] without skin. All fish and shellfish. Wild duck, rabbit, pheasant, and venison. Egg whites or low-cholesterol egg substitutes. Dried beans, peas, lentils, and tofu. Seeds and most nuts. Dairy Low-fat or nonfat cheeses, including ricotta and mozzarella. Skim or 1% milk that is liquid, powdered, or evaporated. Buttermilk that is made with low-fat milk. Nonfat or low-fat yogurt. Fats and oils Non-hydrogenated (trans-free) margarines. Vegetable oils, including soybean, sesame, sunflower, olive, peanut, safflower, corn, canola, and cottonseed. Salad dressings or mayonnaise made with a vegetable oil. Beverages Mineral water. Coffee and tea. Diet carbonated beverages. Sweets and desserts Sherbet, gelatin, and fruit ice. Small amounts of dark chocolate. Limit all sweets and desserts. Seasonings and condiments All seasonings and condiments. The items listed above may not be a complete list of foods and drinks you can eat. Contact a dietitian for more options. What foods  should I avoid? Fruits Canned fruit in heavy syrup. Fruit in cream or butter sauce. Fried fruit. Limit coconut. Vegetables Vegetables cooked in cheese, cream, or butter sauce. Fried vegetables. Grains Breads that are made with saturated or trans fats, oils, or whole milk. Croissants. Sweet rolls. Donuts. High-fat crackers, such as cheese crackers. Meats and other proteins Fatty meats, such as hot dogs, ribs, sausage, bacon, rib-eye roast or steak. High-fat deli meats, such as salami and bologna. Caviar. Domestic duck and goose. Organ meats, such as liver. Dairy Cream, sour cream, cream cheese, and creamed cottage cheese. Whole-milk cheeses. Whole or 2% milk that is liquid, evaporated, or condensed. Whole buttermilk. Cream sauce or high-fat cheese sauce. Yogurt that is made from whole milk. Fats and oils Meat fat, or shortening. Cocoa butter, hydrogenated oils, palm oil, coconut oil, palm kernel oil. Solid fats and shortenings, including bacon fat, salt pork, lard, and butter. Nondairy cream substitutes. Salad dressings with cheese or sour cream. Beverages Regular sodas and juice drinks with added sugar. Sweets and desserts Frosting. Pudding. Cookies. Cakes. Pies. Milk chocolate or white chocolate. Buttered syrups. Full-fat ice cream or ice cream drinks. The items listed above may not be a complete list of foods and drinks to avoid. Contact a dietitian for more information. Summary Heart-healthy meal planning includes eating less unhealthy fats,  eating more healthy fats, and making other changes in your diet. Eat a balanced diet. This includes fruits and vegetables, low-fat or nonfat dairy, lean protein, nuts and legumes, whole grains, and heart-healthy oils and fats. This information is not intended to replace advice given to you by your health care provider. Make sure you discuss any questions you have with your health care provider. Document Revised: 03/28/2021 Document Reviewed:  03/28/2021 Elsevier Patient Education  2022 [employer].   I appreciate the  opportunity to care for you  Thank You   Harl Bowie , MD

## 2021-10-15 NOTE — Progress Notes (Signed)
Bonnie Gonzalez    [id number]    January 15, 1956  Primary Care Physician:Fry, Ishmael Holter, MD  Referring Physician: Laurey Morale, MD [place],  [place] [address]   Chief complaint:  Colorectal cancer screening  HPI: 57 yr very pleasant female here to discuss colorectal cancer screening.  Denies any nausea, vomiting, abdominal pain, melena or bright red blood per rectum She tried to do cologuard last year but she didn't receive the kit, she is not interested in pursuing it.   BMI> 50. Other co-morbid conditions include OSA, HTN, COPD and CHF (EF 60-65%)  No family h/o colon cancer  Outpatient Encounter Medications as of 10/15/2021  Medication Sig   albuterol (PROVENTIL HFA;VENTOLIN HFA) 108 (90 Base) MCG/ACT inhaler Inhale 2 puffs into the lungs every 4 (four) hours as needed for wheezing or shortness of breath.   ALPRAZolam (XANAX) 0.5 MG tablet TAKE 1 TABLET (0.5 MG TOTAL) BY MOUTH 2 (TWO) TIMES DAILY AS NEEDED. (Patient taking differently: Take 0.25 mg by mouth See admin instructions. Take every night and daily as needed for anxiety)   atorvastatin (LIPITOR) 80 MG tablet Take 1 tablet (80 mg total) by mouth daily.   azelastine (ASTELIN) 0.1 % nasal spray Place into the nose.   benzonatate (TESSALON) 200 MG capsule Take 1 capsule (200 mg total) by mouth 2 (two) times daily as needed for cough.   Cholecalciferol 50 MCG (2000 UT) CAPS Take 1 capsule by mouth daily.   Continuous Blood Gluc Sensor (FREESTYLE LIBRE 14 DAY SENSOR) MISC by Does not apply route.   cyanocobalamin 1000 MCG tablet Take 1 tablet by mouth daily.   diltiazem (CARDIZEM CD) 120 MG 24 hr capsule Take 1 capsule (120 mg total) by mouth daily.   donepezil (ARICEPT) 10 MG tablet TAKE 1 TABLET BY MOUTH AT BEDTIME (Patient taking differently: Take 10 mg by mouth at bedtime.)   DULoxetine (CYMBALTA) 60 MG capsule Take 1 capsule (60 mg total) by mouth daily. (Patient taking differently: Take 60  mg by mouth at bedtime.)   esomeprazole (NEXIUM) 40 MG capsule Take 30- 60 min before your first and last meals of the day (Patient taking differently: Take 40 mg by mouth daily before breakfast. Take 1 capsule by mouth daily)   Fluticasone-Umeclidin-Vilant (TRELEGY ELLIPTA) 100-62.5-25 MCG/INH AEPB Inhale into the lungs. Inhale 1 puff into lungs daily for 30 days.   furosemide (LASIX) 40 MG tablet Take 1 tablet (40 mg total) by mouth daily.   glucose 4 GM chewable tablet Chew 1 tablet (4 g total) by mouth as needed for low blood sugar (< 70).   halobetasol (ULTRAVATE) 0.05 % cream Apply topically 2 (two) times daily as needed. (Patient taking differently: Apply 1 application topically 2 (two) times daily as needed (for itching).)   hydrocortisone 2.5 % ointment Apply topically.   hydrOXYzine (VISTARIL) 25 MG capsule Take 1 capsule (25 mg total) by mouth See admin instructions. Take 1 capsule by mouth three times daily as needed.   Hyprom-Naphaz-Polysorb-Zn Sulf (CLEAR EYES COMPLETE) SOLN Place 1-2 drops into both eyes as needed (for itching).   Insulin Degludec 200 UNIT/ML SOPN Inject 80 Units into the skin every morning. Inject 80 units into the skin in the morning and at bedtime.   Insulin Pen Needle (B-D UF III MINI PEN NEEDLES) 31G X 5 MM MISC Use to administer insulin five times a day   levothyroxine (SYNTHROID) 175 MCG  tablet TAKE 1 TABLET BY MOUTH ONCE DAILY BEFORE BREAKFAST   meclizine (ANTIVERT) 50 MG tablet Take 0.5 tablets (25 mg total) by mouth 3 (three) times daily as needed. (Patient taking differently: Take by mouth. Take 25 mg by mouth prn)   methocarbamol (ROBAXIN) 500 MG tablet Take 1 tablet (500 mg total) by mouth every 6 (six) hours as needed for muscle spasms.   naproxen (NAPROSYN) 500 MG tablet Take 500 mg by mouth 2 (two) times daily.    nystatin (MYCOSTATIN/NYSTOP) powder Apply thin layer to rash on lower abdomen BID until rash resolves then once a day   ondansetron (ZOFRAN)  4 MG tablet Take 4 mg by mouth every 8 (eight) hours as needed for nausea or vomiting.   OXYGEN Inhale into the lungs. 2 liters 24/7   Probiotic Product (PROBIOTIC-10 PO) Take by mouth.   Semaglutide,0.25 or 0.5MG/DOS, 2 MG/1.5ML SOPN Inject 1 Dose into the skin once a week. Inject 0.4 mL's (0.2m total) into the skin once a week.   temazepam (RESTORIL) 15 MG capsule Take 15 mg by mouth at bedtime.   Tiotropium Bromide-Olodaterol 2.5-2.5 MCG/ACT AERS Inhale 2.5 mcg into the lungs daily.   insulin aspart (NOVOLOG) 100 UNIT/ML injection Use sliding scale provided at time of discharge, administer 3 times daily with meals (Patient taking differently: Inject 10 Units into the skin 3 (three) times daily with meals. Administer 35 units plus correction three times daily with meals and 30 units with evening snack: MAX TDD 200 Units)   [DISCONTINUED] dicyclomine (BENTYL) 20 MG tablet Take 20 mg by mouth 2 (two) times daily. (Patient not taking: Reported on 09/03/2021)   [DISCONTINUED] eszopiclone (LUNESTA) 2 MG TABS tablet Take 2 mg by mouth once.   [DISCONTINUED] fluconazole (DIFLUCAN) 150 MG tablet Take 1 tablet po x 1 and repeat in 3 days   [DISCONTINUED] metoprolol tartrate (LOPRESSOR) 100 MG tablet Take 1 tablet (100 mg total) by mouth once for 1 dose. Take 1 tablet (100 mg total) two hours prior to CT scan.   [DISCONTINUED] traZODone (DESYREL) 50 MG tablet Take 25 mg by mouth at bedtime. (Patient not taking: Reported on 09/03/2021)   No facility-administered encounter medications on file as of 10/15/2021.    Allergies as of 10/15/2021 - Review Complete 10/15/2021  Allergen Reaction Noted   Aspirin Anaphylaxis 01/10/2019   Hydrocodone-acetaminophen Hives 11/03/2007   Oxycodone-acetaminophen Hives 11/03/2007   Penicillins Hives 11/03/2007   Codeine Hives 11/03/2007   Fluoxetine hcl Itching and Other (See Comments) 11/03/2007   Hydrocodone-acetaminophen Hives 11/03/2007   Oxycodone-acetaminophen  Hives 11/03/2007   Fluoxetine hcl Itching 11/03/2007   Trazodone Other (See Comments) 02/02/2019    Past Medical History:  Diagnosis Date   Acquired hypothyroidism 10/01/2010   Allergic rhinitis 07/20/2009   Arthritis    Back pain 10/18/2015   Bilateral leg edema 03/31/2018   Centrilobular emphysema 02/04/2019   HRCT 09/15/18   Chronic diastolic CHF (congestive heart failure) 01/28/2019   Chronic respiratory failure with hypoxia 02/04/2019   [facility]   Chronic urticaria 12/21/2019   COPD (chronic obstructive pulmonary disease) 09/01/2018   Quit smoking 2010  - Spirometry 08/31/2018  FEV1 1.1 (51%)  Ratio 66 s prior rx with typical curvature  - 08/31/2018  After extensive coaching inhaler device,  effectiveness =    90% with elipta > try anoro sample > no better so d/c'  - PFT's  11/10/2018  FEV1 1.24 (59 % ) ratio 75 (  ratio with fev1/VC =  63)  p no % improvement from saba p nothing prior to study with DLCO  105  % corrects to 132  %    DOE (dyspnea on exertion) 08/31/2018   Onset 05/2018 with background of unexplained leg swelling x 01/2018   -echo 04/13/18 just G1   diastolic dysfunction s PH   -  08/31/2018   Walked RA x one lap @ 185 stopped due to  Sob/ sats 89% at nl pace  - Spirometry 08/31/2018  FEV1 1.1 (51%)  Ratio 66 s prior rx  - 09/28/2018 could not do full pfts 09/28/2018  - Collagen vasc profile 09/28/18  For ESR =  54 (was 102 before prednisone)  Neg collag   Dyshidrotic eczema 12/13/2010   Essential hypertension 01/26/2019   Generalized anxiety disorder with panic attacks 01/20/2018   GERD (gastroesophageal reflux disease) 11/03/2007   Headache 11/03/2007   Irritable bowel syndrome 07/13/2009   Major depressive disorder 07/17/2020   Mixed hyperlipidemia 07/20/2009   Obesity    OSA (obstructive sleep apnea) 07/26/2015   HST 08/2015 mild OSA, AHI 8/hour, lowest desaturation 81%, desaturations noted without respiratory events  Formatting of this  note might be different from the original. PSG 05/20/19 AHI 25.1   Osteopenia of multiple sites 06/01/2019   DEXA 05/24/2019: Right femur neck T- 2.2, left femur neck T- 1.5, right total femur T -0.5, left total femur T -0.5, AP total spine T- 1.9. FRAX 10-year probability of major osteoporotic fracture is 8.8% and a hip fracture is 1.2%  DEXA 05/05/2019: Right femur neck T- 1.9, left femur neck T- 2.0, right total femur T -1.3, left total femur   Oxygen deficiency    Postinflammatory pulmonary fibrosis 09/15/2018   ESR    10/161/9  = 102   > rx short term prednisone ? slt improvement in symptoms HRCT 09/15/2018  1. Very mild basilar subpleural ground-glass, reticulation and traction bronchiolectasis, findings which may be minimally progressive from 06/29/2017 and are indicative of interstitial lung disease such as nonspecific interstitial pneumonitis. Findings are indeterminate for UIP per consensus guidelin   Psychophysiological insomnia 03/29/2019   Type 2 diabetes mellitus 02/09/2017   UTI (urinary tract infection)    Vertigo 03/29/2014   Vitamin D deficiency 09/15/2020    Past Surgical History:  Procedure Laterality Date   ANKLE FRACTURE SURGERY Right    benign tumor      removed from groin   WRIST ARTHROCENTESIS N/A 2001    Family History  Problem Relation Age of Onset   Arthritis/Rheumatoid Mother    Alzheimer's disease Father    Diabetes Mellitus II Father    Dementia Father    Diabetes Mellitus II Sister    Diabetes Mellitus II Brother    Asthma Other        fhx   Depression Other        fhx   Diabetes Other        fhx   Parkinsonism Other        fhx   Lupus Other        fhx    Social History   Socioeconomic History   Marital status: Divorced    Spouse name: Not on file   Number of children: 4   Years of education: 13   Highest education level: Some college, no degree  Occupational History   Not on file  Tobacco Use   Smoking status: Former    Packs/day: 1.00  Years: 35.00    Pack years: 35.00    Types: Cigarettes    Quit date: 12/01/2008    Years since quitting: 12.8   Smokeless tobacco: Never  Vaping Use   Vaping Use: Never used  Substance and Sexual Activity   Alcohol use: Not Currently   Drug use: No   Sexual activity: Not Currently  Other Topics Concern   Not on file  Social History Narrative   Lives alone.   She has four grown children.   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   Highest level of education:  1.5 years of college      Right Handed   Is on 2 L of oxygen    Social Determinants of Health   Financial Resource Strain: Not on file  Food Insecurity: Not on file  Transportation Needs: Not on file  Physical Activity: Not on file  Stress: Not on file  Social Connections: Not on file  Intimate Partner Violence: Not on file      Review of systems: All other review of systems negative except as mentioned in the HPI.   Physical Exam: Vitals:   10/15/21 1019  Pulse: 76   Body mass index is 52.14 kg/m. Gen:      No acute distress HEENT:  sclera anicteric Abd:      soft, non-tender; no palpable masses, no distension Ext:    No edema Neuro: alert and oriented x 3 Psych: normal mood and affect  Data Reviewed:  Reviewed labs, radiology imaging, old records and pertinent past GI work up   Assessment and Plan/Recommendations:  20 yr very pleasant female with HTN, COPD, DM on insulin and diastolic CHF here to discuss colorectal cancer screening BMI>50, will plan to schedule procedure at [facility] endoscopy unit She is average risk for colon cancer screening. Patient is not interested in doing Cologuard. The risks and benefits as well as alternatives of endoscopic procedure(s) have been discussed and reviewed. All questions answered. The patient agrees to proceed.  Fatty liver: discussed dietary changes and exercise Avoid soda or drinks with high fructose corn syrup Avoid alcohol or  herbal remedies and limit use of NSAID's Monitor LFT every 4-6 months  The patient was provided an opportunity to ask questions and all were answered. The patient agreed with the plan and demonstrated an understanding of the instructions.  Damaris Hippo , MD    CC: Laurey Morale, MD

## 2021-10-16 NOTE — Telephone Encounter (Signed)
Spoke with pt reviewed lab results with pt verbalized understanding. 

## 2021-10-24 DIAGNOSIS — J432 Centrilobular emphysema: Secondary | ICD-10-CM | POA: Diagnosis not present

## 2021-10-24 DIAGNOSIS — J9611 Chronic respiratory failure with hypoxia: Secondary | ICD-10-CM | POA: Diagnosis not present

## 2021-10-24 DIAGNOSIS — J449 Chronic obstructive pulmonary disease, unspecified: Secondary | ICD-10-CM | POA: Diagnosis not present

## 2021-10-28 ENCOUNTER — Encounter: Payer: Self-pay | Admitting: Family Medicine

## 2021-10-28 DIAGNOSIS — R251 Tremor, unspecified: Secondary | ICD-10-CM

## 2021-10-28 NOTE — Telephone Encounter (Signed)
Please advise if okay to send in the requested refills.

## 2021-10-29 NOTE — Telephone Encounter (Signed)
I did a referral for her to see Neurology about the tremors

## 2021-10-30 ENCOUNTER — Telehealth: Payer: Self-pay | Admitting: Family Medicine

## 2021-10-30 MED ORDER — NAPROXEN 500 MG PO TABS: 500.0000 mg | ORAL_TABLET | Freq: Two times a day (BID) | ORAL | 11 refills | Status: DC

## 2021-10-30 MED ORDER — BENZONATATE 200 MG PO CAPS: 200.0000 mg | ORAL_CAPSULE | Freq: Four times a day (QID) | ORAL | 5 refills | Status: DC | PRN

## 2021-10-30 NOTE — Telephone Encounter (Signed)
Bonnie Gonzalez from [facility] call and stated  benzonatate l capsule every 6 hours is to many capsule it should be 3 capsule a day.Bonnie Gonzalez want a call back at [phone].

## 2021-10-30 NOTE — Telephone Encounter (Signed)
I sent in refills for both meds

## 2021-10-30 NOTE — Telephone Encounter (Signed)
Pt is calling and would like dr fry to call in abx  also due to cough and chest congestion for the last 5 days. Dr fry has already sent in other medication for this patient  [place], [place] Phone:  [phone]  Fax:  [phone]

## 2021-10-31 ENCOUNTER — Encounter: Payer: HMO | Admitting: Cardiology

## 2021-10-31 ENCOUNTER — Other Ambulatory Visit: Payer: Self-pay

## 2021-10-31 MED ORDER — BENZONATATE 200 MG PO CAPS: 200.0000 mg | ORAL_CAPSULE | Freq: Three times a day (TID) | ORAL | 5 refills | Status: DC | PRN

## 2021-10-31 NOTE — Progress Notes (Signed)
This encounter was created in error - please disregard.

## 2021-10-31 NOTE — Telephone Encounter (Signed)
Dr fry sent Rx for Tessalon Pearls to pt pharmacy

## 2021-10-31 NOTE — Telephone Encounter (Signed)
Pt Rx was changed to every 8 hrs prn

## 2021-10-31 NOTE — Telephone Encounter (Signed)
Please call them to change this to every 8 hours prn cough

## 2021-10-31 NOTE — Progress Notes (Deleted)
Cardiology Note    Date:  10/31/2021   ID:  Bonnie Gonzalez, Bonnie Gonzalez 03, 1957, MRN [id number]  PCP:  Laurey Morale, MD  Cardiologist:  Fransico Him, MD   No chief complaint on file.    History of Present Illness:  Bonnie Gonzalez is a 65 y.o. female with a hx of emphysema/COPD, chronic diastolic CHF, remote hx of tobacco use, chronic DOE and HTN.  2D echo 2020 showed normal LVF with EF 60-65% with G1DD and no valvular heart disease.    She recently started having LE edema several months ago.  She also has been having worsening DOE.  She attributed it to weight gain but had gotten to the point that walking very short distances was very difficult.  She uses a walker to ambulate.  She recently went up to 3L O2.  She uses O2 and CPAP at night.  She is very sedentary due to her significant lung disease. She recently started to cut back on her Na intake and has lost some weight and also the LE edema improved but her SOB did not.  Recent HRCT scan showed new low attenuation lesions in the liver concerning for hepatocellular CA or metastatic CA with cirrhosis.    She has had some problems with chest pain that is sharp that is sporadic with radiation into her neck and her neck will ache.  She has had some problems with diaphoresis and nausea but cannot relate whether it was related to the CP.  She uses CPAP at night but denies PND, orthopnea.    It was felt that her shortness of breath was likely related to morbid obesity, deconditioning and sedentary state as well as COPD.  She was continued on Lasix 40 mg daily.  Repeat 2D echocardiogram showed normal LV function with EF 60 to 65% with grade 1 diastolic dysfunction, normal PA pressures, trivial MR, mild aortic valve sclerosis but no stenosis and normal right atrial pressure.  BNP was normal at 9, TSH normal at 3.1, normal serum creatinine 0.92 and normal hemoglobin at 12.8.  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  She was started on Cardizem CD 120 mg daily for her myocardial bridging.  She is here today for follow-up and is doing well.  She denies any recurrent chest pain or pressure, lower extremity edema, PND, orthopnea, palpitations, dizziness or syncope.  She continues to have shortness of breath which is chronic for her and remains on oxygen.   Past Medical History:  Diagnosis Date   Acquired hypothyroidism 10/01/2010   Allergic rhinitis 07/20/2009   Arthritis    Back pain 10/18/2015   Bilateral leg edema 03/31/2018   Centrilobular emphysema 02/04/2019   HRCT 09/15/18   Chronic diastolic CHF (congestive heart failure) 01/28/2019   Chronic respiratory failure with hypoxia 02/04/2019   6 Min Walk 08/31/18 at [facility]   Chronic urticaria 12/21/2019   COPD (chronic obstructive pulmonary disease) 09/01/2018   Quit smoking 2010  - Spirometry 08/31/2018  FEV1 1.1 (51%)  Ratio 66 s prior rx with typical curvature  - 08/31/2018  After extensive coaching inhaler device,  effectiveness =    90% with elipta > try anoro sample > no better so d/c'  - PFT's  11/10/2018  FEV1 1.24 (59 % ) ratio  75 (ratio with fev1/VC =  63)  p no % improvement from saba p nothing prior to study with DLCO  105  % corrects to 132  %    DOE (dyspnea on exertion) 08/31/2018   Onset 05/2018 with background of unexplained leg swelling x 01/2018   -echo 04/13/18 just G1   diastolic dysfunction s PH   -  08/31/2018   Walked RA x one lap @ 185 stopped due to  Sob/ sats 89% at nl pace  - Spirometry 08/31/2018  FEV1 1.1 (51%)  Ratio 66 s prior rx  - 09/28/2018 could not do full pfts 09/28/2018  - Collagen vasc profile 09/28/18  For ESR =  54 (was 102 before prednisone)  Neg collag   Dyshidrotic eczema 12/13/2010   Essential hypertension 01/26/2019   Generalized anxiety  disorder with panic attacks 01/20/2018   GERD (gastroesophageal reflux disease) 11/03/2007   Headache 11/03/2007   Irritable bowel syndrome 07/13/2009   Major depressive disorder 07/17/2020   Mixed hyperlipidemia 07/20/2009   Obesity    OSA (obstructive sleep apnea) 07/26/2015   HST 08/2015 mild OSA, AHI 8/hour, lowest desaturation 81%, desaturations noted without respiratory events  Formatting of this note might be different from the original. PSG 05/20/19 AHI 25.1   Osteopenia of multiple sites 06/01/2019   DEXA 05/24/2019: Right femur neck T- 2.2, left femur neck T- 1.5, right total femur T -0.5, left total femur T -0.5, AP total spine T- 1.9. FRAX 10-year probability of major osteoporotic fracture is 8.8% and a hip fracture is 1.2%  DEXA 05/05/2019: Right femur neck T- 1.9, left femur neck T- 2.0, right total femur T -1.3, left total femur   Oxygen deficiency    Postinflammatory pulmonary fibrosis 09/15/2018   ESR    10/161/9  = 102   > rx short term prednisone ? slt improvement in symptoms HRCT 09/15/2018  1. Very mild basilar subpleural ground-glass, reticulation and traction bronchiolectasis, findings which may be minimally progressive from 06/29/2017 and are indicative of interstitial lung disease such as nonspecific interstitial pneumonitis. Findings are indeterminate for UIP per consensus guidelin   Psychophysiological insomnia 03/29/2019   Type 2 diabetes mellitus 02/09/2017   UTI (urinary tract infection)    Vertigo 03/29/2014   Vitamin D deficiency 09/15/2020    Past Surgical History:  Procedure Laterality Date   ANKLE FRACTURE SURGERY Right    benign tumor      removed from groin   WRIST ARTHROCENTESIS N/A 2001    Current Medications: No outpatient medications have been marked as taking for the 10/31/21 encounter (Appointment) with Bonnie Margarita, MD.    Allergies:   Aspirin, Hydrocodone-acetaminophen, Oxycodone-acetaminophen, Penicillins, Codeine, Fluoxetine hcl,  Hydrocodone-acetaminophen, Oxycodone-acetaminophen, Fluoxetine hcl, and Trazodone   Social History   Socioeconomic History   Marital status: Divorced    Spouse name: Not on file   Number of children: 4   Years of education: 13   Highest education level: Some college, no degree  Occupational History    Comment: retired  Tobacco Use   Smoking status: Former    Packs/day: 1.00    Years: 35.00    Pack years: 35.00    Types: Cigarettes    Quit date: 12/01/2008    Years since quitting: 12.9   Smokeless tobacco: Never  Vaping Use   Vaping Use: Never used  Substance and Sexual Activity   Alcohol use: Not Currently   Drug use: No   Sexual activity: Not Currently  Other Topics Concern   Not on file  Social History Narrative   Lives alone.   She has four grown children.   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   Highest level of education:  1.5 years of college      Right Handed   Is on 2 L of oxygen    Social Determinants of Health   Financial Resource Strain: Not on file  Food Insecurity: Not on file  Transportation Needs: Not on file  Physical Activity: Not on file  Stress: Not on file  Social Connections: Not on file     Family History:  The patient's family history includes Alzheimer's disease in her father; Arthritis/Rheumatoid in her mother; Asthma in an other family member; Dementia in her father; Depression in an other family member; Diabetes in an other family member; Diabetes Mellitus II in her brother, father, and sister; Lupus in an other family member; Parkinsonism in an other family member.   ROS:   Please see the history of present illness.    ROS All other systems reviewed and are negative.  No flowsheet data found.   PHYSICAL EXAM:   VS:  There were no vitals taken for this visit.   GEN: Well nourished, well developed in no acute distress, morbidly obese HEENT: Normal NECK: No JVD; No carotid bruits LYMPHATICS: No  lymphadenopathy CARDIAC:RRR, no murmurs, rubs, gallops RESPIRATORY:  Clear to auscultation without rales, wheezing or rhonchi  ABDOMEN: Soft, non-tender, non-distended MUSCULOSKELETAL:  No edema; No deformity  SKIN: Warm and dry NEUROLOGIC:  Alert and oriented x 3 PSYCHIATRIC:  Normal affect   Wt Readings from Last 3 Encounters:  10/15/21 267 lb (121.1 kg)  09/03/21 268 lb 9.6 oz (121.8 kg)  07/31/21 269 lb (122 kg)      Studies/Labs Reviewed:   EKG:  EKG is not ordered today.   Recent Labs: 09/03/2021: BUN 12; Creatinine, Ser 0.92; Hemoglobin 12.8; NT-Pro BNP 9; Platelets 208; Potassium 4.5; Sodium 139; TSH 3.110 09/30/2021: ALT 35   Lipid Panel    Component Value Date/Time   CHOL 147 09/30/2021 0915   TRIG 125 09/30/2021 0915   HDL 33 (L) 09/30/2021 0915   CHOLHDL 4.5 (H) 09/30/2021 0915   CHOLHDL 6 04/10/2021 0716   VLDL 43.0 (H) 04/10/2021 0716   LDLCALC 91 09/30/2021 0915   LDLDIRECT 176.0 04/10/2021 0716    Additional studies/ records that were reviewed today include:  OV notes from PCP    ASSESSMENT:    1. Chronic diastolic CHF (congestive heart failure)   2. Essential hypertension   3. Nonspecific abnormal electrocardiogram (ECG) (EKG)   4. Coronary artery disease due to lipid rich plaque   5. Mixed hyperlipidemia      PLAN:  In order of problems listed above:  Chronic diastolic CHF/SOB -Recent 2D echo 08/2021 showed normal LV function with grade 1 diastolic dysfunction. 65% and normal PA pressures -BMP, CBC and TSH were normal -Coronary CTA showed no obstructive CAD and minimal plaque in the LAD.  There was a myocardial bridge in the mid LAD. -Given her morbid obesity is difficult to assess for volume overload but she does not appear overtly volume overloaded on exam today. -suspect her SOB is related to morbid obesity, deconditioning, sedentary state and COPD -Continue prescription drug management with Lasix 40 mg daily  2.  HTN -BP is  adequately controlled on exam today -Continue prescription drug management with Cardizem CD 120 mg daily as  needed refills  3.  Abnormal EKG/ASCAD -EKG shows prior inferior and septal infarct -Coronary CTA showed coronary calcium score of 10 with minimal less than 25% mixed calcified and noncalcified plaque in the LAD and a 50% myocardial bridge in the mid LAD -She has not had any further chest pain on Cardizem CD -Continue medical management with aspirin 81 mg daily and Cardizem CD 120 mg daily as well as statin with as needed refills  4.  Hyperlipidemia -LDL goal less than 70 -Continue prescription drug management with atorvastatin 80 mg daily -Repeat FLP and ALT  Follow-up with me in 1 year   Medication Adjustments/Labs and Tests Ordered: Current medicines are reviewed at length with the patient today.  Concerns regarding medicines are outlined above.  Medication changes, Labs and Tests ordered today are listed in the Patient Instructions below.  There are no Patient Instructions on file for this visit.   Signed, Fransico Him, MD  10/31/2021 11:51 AM    [address], [place], [place]  [address] Phone: [phone]; Fax: [phone]

## 2021-11-01 MED ORDER — AZITHROMYCIN 250 MG PO TABS: ORAL_TABLET | ORAL | 0 refills | Status: DC

## 2021-11-01 NOTE — Telephone Encounter (Signed)
I sent in a Beulah for her

## 2021-11-07 ENCOUNTER — Other Ambulatory Visit: Payer: Self-pay

## 2021-11-08 NOTE — Progress Notes (Signed)
Assessment/Plan:    1.  Tremor  -As previous, the medications for her lungs are likely exacerbating this, even though she certainly may have a degree of essential tremor as well.  -Would be helpful if she could potentially video some of the episodes that she is having.  May be some functional aspects to this, but was very difficult to tell.  -We decided to start primidone, 50 mg, half tablet at bedtime and then 1 to 1 tablet daily thereafter.  2.  Memory change  -Neurocognitive testing in August, 2022 was normal.  No evidence of a neurodegenerative process.  I personally do not see any benefit to the donepezil that she is on and has been on for several years.  I would recommend discontinuing that, especially given the number of medicines she is already on.  3.  Follow-up 6 months.  Subjective:   Bonnie Gonzalez was seen today in follow up for tremor.  My previous records were reviewed prior to todays visit. Pt last seen in May.  We thought about appointment time that her medications for her lungs were likely contributing to tremor, although stated that she could also have some degree of essential tremor as well.  She really did not want further medication.  Patient was also concerned about her memory at that time.  She had neurocognitive testing with Dr. Melvyn Novas in August 2022.  Those notes are reviewed.  Her neurocognitive testing was normal.  He noted that "it is likely that subjective cognitive dysfunction is due to normal aging, exacerbated by significant psychiatric distress, sleep disruption and fatigue, pain and other medical ailments, and polypharmacy."  Pt states that since our last visit her tremor has been worse.  States that it can be violent and it can be in spells.  It can be one or both hands.  It doesn't necessarily seem to be associated with albuterol.  She states that she can have multiple episodes in a day and then go for a few days without the severe episodes but then will have  them again.  Can be in arm or legs.  More in the R than the L.  States that Dr. Melvyn Novas witnessed a spell, and it was the only time anyone had witnessed it, including her family.  Reviewed Dr. Gustavus Bryant notes, and he stated that there was no tremor.  Patient doesn't ambulate much at all.      PREVIOUS MEDICATIONS: none to date  ALLERGIES:   Allergies  Allergen Reactions   Aspirin Anaphylaxis    Mother had to be resuscitated after ingesting this   Hydrocodone-Acetaminophen Hives   Oxycodone-Acetaminophen Hives   Penicillins Hives    Did it involve swelling of the face/tongue/throat, SOB, or low BP? Yes-hives Did it involve sudden or severe rash/hives, skin peeling, or any reaction on the inside of your mouth or nose? Unk Did you need to seek medical attention at a hospital or doctor's office? Yes When did it last happen? Was in her 8's If all above answers are "NO", may proceed with cephalosporin use.  Did it involve swelling of the face/tongue/throat, SOB, or low BP? Yes-hives Did it involve sudden or severe rash/hives, skin peeling, or any reaction on the inside of your mouth or nose? Unk Did you need to seek medical attention at a hospital or doctor's office? Yes When did it last happen? Was in her 35's If all above answers are "NO", may proceed with cephalosporin use.   Codeine Hives  Fluoxetine Hcl Itching and Other (See Comments)    "Hair itching"   Hydrocodone-Acetaminophen Hives   Oxycodone-Acetaminophen Hives   Fluoxetine Hcl Itching    "Hair itching"   Trazodone Other (See Comments)    CURRENT MEDICATIONS:  Outpatient Encounter Medications as of 11/11/2021  Medication Sig   primidone (MYSOLINE) 50 MG tablet Take 1 tablet (50 mg total) by mouth at bedtime.   albuterol (PROVENTIL HFA;VENTOLIN HFA) 108 (90 Base) MCG/ACT inhaler Inhale 2 puffs into the lungs every 4 (four) hours as needed for wheezing or shortness of breath.   ALPRAZolam (XANAX) 0.5 MG tablet TAKE 1 TABLET  (0.5 MG TOTAL) BY MOUTH 2 (TWO) TIMES DAILY AS NEEDED. (Patient taking differently: Take 0.25 mg by mouth See admin instructions. Take every night and daily as needed for anxiety)   atorvastatin (LIPITOR) 80 MG tablet Take 1 tablet (80 mg total) by mouth daily.   azelastine (ASTELIN) 0.1 % nasal spray Place into the nose.   azithromycin (ZITHROMAX Z-PAK) 250 MG tablet As directed   benzonatate (TESSALON) 200 MG capsule Take 1 capsule (200 mg total) by mouth every 8 (eight) hours as needed for cough.   Cholecalciferol 50 MCG (2000 UT) CAPS Take 1 capsule by mouth daily.   Continuous Blood Gluc Sensor (FREESTYLE LIBRE 14 DAY SENSOR) MISC by Does not apply route.   cyanocobalamin 1000 MCG tablet Take 1 tablet by mouth daily.   diltiazem (CARDIZEM CD) 120 MG 24 hr capsule Take 1 capsule (120 mg total) by mouth daily.   DULoxetine (CYMBALTA) 60 MG capsule Take 1 capsule (60 mg total) by mouth daily. (Patient taking differently: Take 60 mg by mouth at bedtime.)   esomeprazole (NEXIUM) 40 MG capsule Take 30- 60 min before your first and last meals of the day (Patient taking differently: Take 40 mg by mouth daily before breakfast. Take 1 capsule by mouth daily)   Fluticasone-Umeclidin-Vilant (TRELEGY ELLIPTA) 100-62.5-25 MCG/INH AEPB Inhale into the lungs. Inhale 1 puff into lungs daily for 30 days.   furosemide (LASIX) 40 MG tablet Take 1 tablet (40 mg total) by mouth daily.   glucose 4 GM chewable tablet Chew 1 tablet (4 g total) by mouth as needed for low blood sugar (< 70).   halobetasol (ULTRAVATE) 0.05 % cream Apply topically 2 (two) times daily as needed. (Patient taking differently: Apply 1 application topically 2 (two) times daily as needed (for itching).)   hydrocortisone 2.5 % ointment Apply topically.   hydrOXYzine (VISTARIL) 25 MG capsule Take 1 capsule (25 mg total) by mouth See admin instructions. Take 1 capsule by mouth three times daily as needed.   Hyprom-Naphaz-Polysorb-Zn Sulf (CLEAR  EYES COMPLETE) SOLN Place 1-2 drops into both eyes as needed (for itching).   insulin aspart (NOVOLOG) 100 UNIT/ML injection Use sliding scale provided at time of discharge, administer 3 times daily with meals (Patient taking differently: Inject 10 Units into the skin 3 (three) times daily with meals. Administer 35 units plus correction three times daily with meals and 30 units with evening snack: MAX TDD 200 Units)   Insulin Degludec 200 UNIT/ML SOPN Inject 80 Units into the skin every morning. Inject 80 units into the skin in the morning and at bedtime.   Insulin Pen Needle (B-D UF III MINI PEN NEEDLES) 31G X 5 MM MISC Use to administer insulin five times a day   levothyroxine (SYNTHROID) 175 MCG tablet TAKE 1 TABLET BY MOUTH ONCE DAILY BEFORE BREAKFAST   meclizine (ANTIVERT) 50 MG  tablet Take 0.5 tablets (25 mg total) by mouth 3 (three) times daily as needed. (Patient taking differently: Take by mouth. Take 25 mg by mouth prn)   methocarbamol (ROBAXIN) 500 MG tablet Take 1 tablet (500 mg total) by mouth every 6 (six) hours as needed for muscle spasms.   naproxen (NAPROSYN) 500 MG tablet Take 1 tablet (500 mg total) by mouth 2 (two) times daily.   nystatin (MYCOSTATIN/NYSTOP) powder Apply thin layer to rash on lower abdomen BID until rash resolves then once a day   ondansetron (ZOFRAN) 4 MG tablet Take 4 mg by mouth every 8 (eight) hours as needed for nausea or vomiting.   OXYGEN Inhale into the lungs. 2 liters 24/7   Probiotic Product (PROBIOTIC-10 PO) Take by mouth.   Semaglutide,0.25 or 0.5MG /DOS, 2 MG/1.5ML SOPN Inject 1 Dose into the skin once a week. Inject 0.4 mL's (0.5mg  total) into the skin once a week.   Sodium Sulfate-Mag Sulfate-KCl (SUTAB) [phone] MG TABS As directed by GI offfice for colon prep   temazepam (RESTORIL) 15 MG capsule Take 15 mg by mouth at bedtime.   Tiotropium Bromide-Olodaterol 2.5-2.5 MCG/ACT AERS Inhale 2.5 mcg into the lungs daily.   [DISCONTINUED] donepezil  (ARICEPT) 10 MG tablet TAKE 1 TABLET BY MOUTH AT BEDTIME (Patient taking differently: Take 10 mg by mouth at bedtime.)   No facility-administered encounter medications on file as of 11/11/2021.     Objective:    PHYSICAL EXAMINATION:    VITALS:   Vitals:   11/11/21 1534  BP: 122/76  Pulse: 76  SpO2: 93%  Weight: 267 lb (121.1 kg)  Height: 5' (1.524 m)    Cardiovascular: Regular rate rhythm Lungs: Clear to auscultation bilaterally.  Wearing oxygen. Neck: No bruits  Orientation:  The patient is alert and oriented x 3.   Cranial nerves: There is good facial symmetry. Extraocular muscles are intact and visual fields are full to confrontational testing. Speech is fluent and clear. Soft palate rises symmetrically and there is no tongue deviation. Hearing is intact to conversational tone. Tone: Tone is good throughout. Sensation: Sensation is intact to light touch touch throughout Coordination:  The patient has no dysdiadichokinesia or dysmetria. Motor: Strength is at least antigravity x4. Gait and Station: The patient pushes off of the wheelchair to arise.  She is given a walker.  She ambulates fairly well with a walker.   MOVEMENT EXAM: Tremor:  There is no rest tremor.  No postural tremor.  Very little intention tremor.  After her right hand is held in the proximal position for a while (approximating the left hand), she then develops somewhat of a flapping tremor in the right arm.  There is some entrainment to this.  We then had her placed the right arm in the lap and started the same sequence over, but were not able to elicit the same thing. I have reviewed and interpreted the following labs independently   Chemistry      Component Value Date/Time   NA 139 09/03/2021 1212   K 4.5 09/03/2021 1212   CL 95 (L) 09/03/2021 1212   CO2 31 (H) 09/03/2021 1212   BUN 12 09/03/2021 1212   CREATININE 0.92 09/03/2021 1212      Component Value Date/Time   CALCIUM 10.2 09/03/2021 1212    ALKPHOS 85 02/11/2019 0216   AST 29 02/11/2019 0216   ALT 35 (H) 09/30/2021 0915   BILITOT 0.5 02/11/2019 0216      Lab Results  Component Value Date   WBC 11.0 (H) 09/03/2021   HGB 12.8 09/03/2021   HCT 40.6 09/03/2021   MCV 83 09/03/2021   PLT 208 09/03/2021   Lab Results  Component Value Date   TSH 3.110 09/03/2021     Chemistry      Component Value Date/Time   NA 139 09/03/2021 1212   K 4.5 09/03/2021 1212   CL 95 (L) 09/03/2021 1212   CO2 31 (H) 09/03/2021 1212   BUN 12 09/03/2021 1212   CREATININE 0.92 09/03/2021 1212      Component Value Date/Time   CALCIUM 10.2 09/03/2021 1212   ALKPHOS 85 02/11/2019 0216   AST 29 02/11/2019 0216   ALT 35 (H) 09/30/2021 0915   BILITOT 0.5 02/11/2019 0216         Cc:  Laurey Morale, MD

## 2021-11-11 ENCOUNTER — Other Ambulatory Visit: Payer: Self-pay

## 2021-11-11 ENCOUNTER — Ambulatory Visit (INDEPENDENT_AMBULATORY_CARE_PROVIDER_SITE_OTHER): Payer: HMO | Admitting: Neurology

## 2021-11-11 ENCOUNTER — Encounter: Payer: Self-pay | Admitting: Neurology

## 2021-11-11 VITALS — BP 122/76 | HR 76 | Ht 60.0 in | Wt 267.0 lb

## 2021-11-11 DIAGNOSIS — G250 Essential tremor: Secondary | ICD-10-CM

## 2021-11-11 MED ORDER — PRIMIDONE 50 MG PO TABS: 50.0000 mg | ORAL_TABLET | Freq: Every day | ORAL | 1 refills | Status: DC

## 2021-11-11 NOTE — Patient Instructions (Signed)
Start primidone 50 mg - 1/2 tablet at bedtime for 1 week and then increase to 1 tablet at bedtime thereafter.

## 2021-11-13 ENCOUNTER — Other Ambulatory Visit: Payer: HMO | Admitting: *Deleted

## 2021-11-13 ENCOUNTER — Other Ambulatory Visit: Payer: Self-pay

## 2021-11-13 DIAGNOSIS — E8881 Metabolic syndrome: Secondary | ICD-10-CM | POA: Diagnosis not present

## 2021-11-13 DIAGNOSIS — I10 Essential (primary) hypertension: Secondary | ICD-10-CM | POA: Diagnosis not present

## 2021-11-13 DIAGNOSIS — Z794 Long term (current) use of insulin: Secondary | ICD-10-CM | POA: Diagnosis not present

## 2021-11-13 DIAGNOSIS — Z6841 Body Mass Index (BMI) 40.0 and over, adult: Secondary | ICD-10-CM | POA: Diagnosis not present

## 2021-11-13 DIAGNOSIS — E782 Mixed hyperlipidemia: Secondary | ICD-10-CM

## 2021-11-13 DIAGNOSIS — Z7985 Long-term (current) use of injectable non-insulin antidiabetic drugs: Secondary | ICD-10-CM | POA: Diagnosis not present

## 2021-11-13 DIAGNOSIS — E1140 Type 2 diabetes mellitus with diabetic neuropathy, unspecified: Secondary | ICD-10-CM | POA: Diagnosis not present

## 2021-11-13 DIAGNOSIS — E1165 Type 2 diabetes mellitus with hyperglycemia: Secondary | ICD-10-CM | POA: Diagnosis not present

## 2021-11-13 LAB — LIPID PANEL
Chol/HDL Ratio: 4.4 ratio (ref 0.0–4.4)
Cholesterol, Total: 167 mg/dL (ref 100–199)
HDL: 38 mg/dL — ABNORMAL LOW (ref >39)
LDL Chol Calc (NIH): 110 mg/dL — ABNORMAL HIGH (ref 0–99)
Triglycerides: 106 mg/dL (ref 0–149)
VLDL Cholesterol Cal: 19 mg/dL (ref 5–40)

## 2021-11-13 LAB — ALT: ALT: 44 IU/L — ABNORMAL HIGH (ref 0–32)

## 2021-11-19 ENCOUNTER — Encounter (HOSPITAL_COMMUNITY): Payer: Self-pay | Admitting: Anesthesiology

## 2021-11-19 ENCOUNTER — Encounter (HOSPITAL_COMMUNITY): Payer: Self-pay | Admitting: Internal Medicine

## 2021-11-19 ENCOUNTER — Ambulatory Visit (HOSPITAL_COMMUNITY)
Admission: RE | Admit: 2021-11-19 | Discharge: 2021-11-19 | Disposition: A | Discharge status: Home / Self Care | Payer: HMO | Source: Ambulatory Visit | Attending: Internal Medicine | Admitting: Internal Medicine

## 2021-11-19 ENCOUNTER — Other Ambulatory Visit: Payer: Self-pay

## 2021-11-19 ENCOUNTER — Encounter (HOSPITAL_COMMUNITY)
Admission: RE | Disposition: A | Discharge status: Home / Self Care | Payer: Self-pay | Source: Ambulatory Visit | Attending: Internal Medicine

## 2021-11-19 DIAGNOSIS — Z539 Procedure and treatment not carried out, unspecified reason: Secondary | ICD-10-CM | POA: Diagnosis not present

## 2021-11-19 DIAGNOSIS — K760 Fatty (change of) liver, not elsewhere classified: Secondary | ICD-10-CM

## 2021-11-19 DIAGNOSIS — Z1211 Encounter for screening for malignant neoplasm of colon: Secondary | ICD-10-CM | POA: Insufficient documentation

## 2021-11-19 DIAGNOSIS — Z6841 Body Mass Index (BMI) 40.0 and over, adult: Secondary | ICD-10-CM

## 2021-11-19 SURGERY — CANCELLED PROCEDURE

## 2021-11-19 MED ORDER — LACTATED RINGERS IV SOLN: INTRAVENOUS | Status: DC

## 2021-11-19 MED ORDER — SODIUM CHLORIDE 0.9 % IV SOLN: INTRAVENOUS | Status: DC

## 2021-11-19 SURGICAL SUPPLY — 21 items

## 2021-11-19 NOTE — Progress Notes (Signed)
Patient presented this morning to the endoscopy unit. She did her Sutab prep last night but vomited up a portion of it. She had a few small pellets this morning but has had no other stools after drinking the prep. She states that she is constipated at a baseline. I told the patient that based upon this information, it would be unlikely that I would be able to see her colonic mucosa adequately in order to complete the colonoscopy. I inquired about availability to move her procedure to tomorrow but was told that there was no availability tomorrow at [facility] for outpatient procedures. Informed her primary GI physician Dr. Silverio Decamp of this discussion.

## 2021-11-19 NOTE — Anesthesia Preprocedure Evaluation (Deleted)
Anesthesia Evaluation    Reviewed: Allergy & Precautions, Patient's Chart, lab work & pertinent test results  History of Anesthesia Complications Negative for: history of anesthetic complications  Airway        Dental   Pulmonary sleep apnea , COPD, former smoker,           Cardiovascular hypertension, +CHF       Neuro/Psych negative neurological ROS  negative psych ROS   GI/Hepatic Neg liver ROS, GERD  ,  Endo/Other  diabetes, Type 2, Insulin DependentHypothyroidism   Renal/GU negative Renal ROS  negative genitourinary   Musculoskeletal negative musculoskeletal ROS (+)   Abdominal   Peds  Hematology negative hematology ROS (+)   Anesthesia Other Findings Day of surgery medications reviewed with patient.  Reproductive/Obstetrics negative OB ROS                             Anesthesia Physical Anesthesia Plan Anesthesia Quick Evaluation

## 2021-11-20 ENCOUNTER — Telehealth: Payer: Self-pay | Admitting: Family Medicine

## 2021-11-20 NOTE — Telephone Encounter (Signed)
Patient called before triage line to schedule appointment. Got triage line after patient and spoke to [place]. She stated that she triaged patient and the outcome was to be seen in the next four hours. Rise Paganini stated that patient said there was no appointments for today, so Rise Paganini advise patient to go to UC, patient refused and called back to get virtual for tomorrow. Patient did not mention going to triage, outcome or symptoms, only that she was covid positive and wanted something for her cough. Patient is scheduled for virtual tomorrow

## 2021-11-21 ENCOUNTER — Telehealth: Payer: Self-pay | Admitting: Gastroenterology

## 2021-11-21 ENCOUNTER — Other Ambulatory Visit: Payer: Self-pay

## 2021-11-21 ENCOUNTER — Encounter: Payer: Self-pay | Admitting: Family Medicine

## 2021-11-21 ENCOUNTER — Telehealth (INDEPENDENT_AMBULATORY_CARE_PROVIDER_SITE_OTHER): Payer: HMO | Admitting: Family Medicine

## 2021-11-21 DIAGNOSIS — Z8616 Personal history of COVID-19: Secondary | ICD-10-CM | POA: Insufficient documentation

## 2021-11-21 DIAGNOSIS — Z1211 Encounter for screening for malignant neoplasm of colon: Secondary | ICD-10-CM

## 2021-11-21 DIAGNOSIS — U071 COVID-19: Secondary | ICD-10-CM | POA: Diagnosis not present

## 2021-11-21 HISTORY — DX: Personal history of COVID-19: Z86.16

## 2021-11-21 MED ORDER — NIRMATRELVIR/RITONAVIR (PAXLOVID)TABLET: 3.0000 | ORAL_TABLET | Freq: Two times a day (BID) | ORAL | 0 refills | Status: AC

## 2021-11-21 NOTE — Telephone Encounter (Signed)
Patient needs a screening colonoscopy at [facility]. Patient of Dr Silverio Decamp.

## 2021-11-21 NOTE — Telephone Encounter (Signed)
Inbound call from patient wanting to reschedule procedure at the hospital.  Please advise.

## 2021-11-21 NOTE — Telephone Encounter (Signed)
Called the patient back. No answer. Left her a message. She is scheduled 02/06/22 arrive to [facility] at 7:00 am. New instructions mailed to her.

## 2021-11-21 NOTE — Progress Notes (Signed)
Subjective:    Patient ID: Bonnie Gonzalez, female    DOB: [birth year], 65 y.o.   MRN: [id number]  HPI Virtual Visit via Video Note  I connected with the patient on 11/21/21 at 10:30 AM EST by a video enabled telemedicine application and verified that I am speaking with the correct person using two identifiers.  Location patient: home Location provider:work or home office Persons participating in the virtual visit: patient, provider  I discussed the limitations of evaluation and management by telemedicine and the availability of in person appointments. The patient expressed understanding and agreed to proceed.   HPI: Here for a Covid-19 infection. This started 3 days ago with PND, chest congestion, and a dry cough. No chest pain or fever. She is SOB. She is using 3 liters of oxygen as usual. She tested positive for the Covid virus 3 days ago. No NVD. Drinking fluids and taking Benzonatate.    ROS: See pertinent positives and negatives per HPI.  Past Medical History:  Diagnosis Date   Acquired hypothyroidism 10/01/2010   Allergic rhinitis 07/20/2009   Arthritis    Back pain 10/18/2015   Bilateral leg edema 03/31/2018   Centrilobular emphysema 02/04/2019   HRCT 09/15/18   Chronic diastolic CHF (congestive heart failure) 01/28/2019   Chronic respiratory failure with hypoxia 02/04/2019   6 Min Walk 08/31/18 at [facility]   Chronic urticaria 12/21/2019   COPD (chronic obstructive pulmonary disease) 09/01/2018   Quit smoking 2010  - Spirometry 08/31/2018  FEV1 1.1 (51%)  Ratio 66 s prior rx with typical curvature  - 08/31/2018  After extensive coaching inhaler device,  effectiveness =    90% with elipta > try anoro sample > no better so d/c'  - PFT's  11/10/2018  FEV1 1.24 (59 % ) ratio 75 (ratio with fev1/VC =  63)  p no % improvement from saba p nothing prior to study with DLCO  105  % corrects to 132  %    DOE (dyspnea on exertion) 08/31/2018   Onset 05/2018 with background of  unexplained leg swelling x 01/2018   -echo 04/13/18 just G1   diastolic dysfunction s PH   -  08/31/2018   Walked RA x one lap @ 185 stopped due to  Sob/ sats 89% at nl pace  - Spirometry 08/31/2018  FEV1 1.1 (51%)  Ratio 66 s prior rx  - 09/28/2018 could not do full pfts 09/28/2018  - Collagen vasc profile 09/28/18  For ESR =  54 (was 102 before prednisone)  Neg collag   Dyshidrotic eczema 12/13/2010   Essential hypertension 01/26/2019   Generalized anxiety disorder with panic attacks 01/20/2018   GERD (gastroesophageal reflux disease) 11/03/2007   Headache 11/03/2007   Irritable bowel syndrome 07/13/2009   Major depressive disorder 07/17/2020   Mixed hyperlipidemia 07/20/2009   Obesity    OSA (obstructive sleep apnea) 07/26/2015   HST 08/2015 mild OSA, AHI 8/hour, lowest desaturation 81%, desaturations noted without respiratory events  Formatting of this note might be different from the original. PSG 05/20/19 AHI 25.1   Osteopenia of multiple sites 06/01/2019   DEXA 05/24/2019: Right femur neck T- 2.2, left femur neck T- 1.5, right total femur T -0.5, left total femur T -0.5, AP total spine T- 1.9. FRAX 10-year probability of major osteoporotic fracture is 8.8% and a hip fracture is 1.2%  DEXA 05/05/2019: Right femur neck T- 1.9, left femur neck T- 2.0, right total femur T -1.3, left total  femur   Oxygen deficiency    Postinflammatory pulmonary fibrosis 09/15/2018   ESR    10/161/9  = 102   > rx short term prednisone ? slt improvement in symptoms HRCT 09/15/2018  1. Very mild basilar subpleural ground-glass, reticulation and traction bronchiolectasis, findings which may be minimally progressive from 06/29/2017 and are indicative of interstitial lung disease such as nonspecific interstitial pneumonitis. Findings are indeterminate for UIP per consensus guidelin   Psychophysiological insomnia 03/29/2019   Type 2 diabetes mellitus 02/09/2017   UTI (urinary tract infection)    Vertigo 03/29/2014   Vitamin D  deficiency 09/15/2020    Past Surgical History:  Procedure Laterality Date   ANKLE FRACTURE SURGERY Right    benign tumor      removed from groin   WRIST ARTHROCENTESIS N/A 2001    Family History  Problem Relation Age of Onset   Arthritis/Rheumatoid Mother    Alzheimer's disease Father    Diabetes Mellitus II Father    Dementia Father    Diabetes Mellitus II Sister    Diabetes Mellitus II Brother    Asthma Other        fhx   Depression Other        fhx   Diabetes Other        fhx   Parkinsonism Other        fhx   Lupus Other        fhx     Current Outpatient Medications:    albuterol (PROVENTIL HFA;VENTOLIN HFA) 108 (90 Base) MCG/ACT inhaler, Inhale 2 puffs into the lungs every 4 (four) hours as needed for wheezing or shortness of breath., Disp: 1 Inhaler, Rfl: 11   ALPRAZolam (XANAX) 0.5 MG tablet, TAKE 1 TABLET (0.5 MG TOTAL) BY MOUTH 2 (TWO) TIMES DAILY AS NEEDED. (Patient taking differently: Take 0.25 mg by mouth See admin instructions. Take 0.25 mg at night, may take an additional 0.25 to 0.5 mg dose during the day as needed for anxiety), Disp: 60 tablet, Rfl: 5   atorvastatin (LIPITOR) 80 MG tablet, Take 1 tablet (80 mg total) by mouth daily., Disp: 90 tablet, Rfl: 3   azelastine (ASTELIN) 0.1 % nasal spray, Place 1 spray into the nose 2 (two) times daily as needed for allergies., Disp: , Rfl:    benzonatate (TESSALON) 200 MG capsule, Take 1 capsule (200 mg total) by mouth every 8 (eight) hours as needed for cough., Disp: 60 capsule, Rfl: 5   Cholecalciferol 50 MCG (2000 UT) CAPS, Take 1 capsule by mouth daily., Disp: , Rfl:    Continuous Blood Gluc Sensor (FREESTYLE LIBRE 14 DAY SENSOR) MISC, by Does not apply route., Disp: , Rfl:    diltiazem (CARDIZEM CD) 120 MG 24 hr capsule, Take 1 capsule (120 mg total) by mouth daily., Disp: 90 capsule, Rfl: 3   DULoxetine (CYMBALTA) 60 MG capsule, Take 1 capsule (60 mg total) by mouth daily. (Patient taking differently: Take 120  mg by mouth at bedtime.), Disp: 90 capsule, Rfl: 3   esomeprazole (NEXIUM) 20 MG capsule, Take 20 mg by mouth daily., Disp: , Rfl:    Fluticasone-Umeclidin-Vilant (TRELEGY ELLIPTA) 100-62.5-25 MCG/INH AEPB, Inhale 1 puff into the lungs daily., Disp: , Rfl:    furosemide (LASIX) 40 MG tablet, Take 1 tablet (40 mg total) by mouth daily. (Patient taking differently: Take 20 mg by mouth daily.), Disp: 90 tablet, Rfl: 3   glucose 4 GM chewable tablet, Chew 1 tablet (4 g total) by mouth as  needed for low blood sugar (< 70)., Disp: 50 tablet, Rfl: 0   hydrOXYzine (VISTARIL) 25 MG capsule, Take 1 capsule (25 mg total) by mouth See admin instructions. Take 1 capsule by mouth three times daily as needed. (Patient taking differently: Take 25 mg by mouth 3 (three) times daily.), Disp: 90 capsule, Rfl: 5   Hyprom-Naphaz-Polysorb-Zn Sulf (CLEAR EYES COMPLETE) SOLN, Place 1-2 drops into both eyes as needed (for itching)., Disp: , Rfl:    insulin glargine, 2 Unit Dial, (TOUJEO MAX SOLOSTAR) 300 UNIT/ML Solostar Pen, Inject 75 Units into the skin 2 (two) times daily., Disp: , Rfl:    Insulin Pen Needle (B-D UF III MINI PEN NEEDLES) 31G X 5 MM MISC, Use to administer insulin five times a day, Disp: , Rfl:    levothyroxine (SYNTHROID) 175 MCG tablet, TAKE 1 TABLET BY MOUTH ONCE DAILY BEFORE BREAKFAST, Disp: 90 tablet, Rfl: 3   meclizine (ANTIVERT) 50 MG tablet, Take 0.5 tablets (25 mg total) by mouth 3 (three) times daily as needed. (Patient taking differently: Take 25 mg by mouth 3 (three) times daily as needed for dizziness.), Disp: 30 tablet, Rfl: 0   methocarbamol (ROBAXIN) 500 MG tablet, Take 1 tablet (500 mg total) by mouth every 6 (six) hours as needed for muscle spasms., Disp: 120 tablet, Rfl: 5   naproxen (NAPROSYN) 500 MG tablet, Take 1 tablet (500 mg total) by mouth 2 (two) times daily., Disp: 60 tablet, Rfl: 11   nirmatrelvir/ritonavir EUA (PAXLOVID) 20 x 150 MG & 10 x 100MG TABS, Take 3 tablets by mouth 2  (two) times daily for 5 days. (Take nirmatrelvir 150 mg two tablets twice daily for 5 days and ritonavir 100 mg one tablet twice daily for 5 days) Patient GFR is 69, Disp: 30 tablet, Rfl: 0   nystatin (MYCOSTATIN/NYSTOP) powder, Apply 1 application topically 2 (two) times daily as needed (rash)., Disp: , Rfl:    OXYGEN, Inhale into the lungs. 2 liters 24/7, Disp: , Rfl:    primidone (MYSOLINE) 50 MG tablet, Take 1 tablet (50 mg total) by mouth at bedtime. (Patient taking differently: Take 25-50 mg by mouth See admin instructions. Take 25 mg nightly for 7 days then increase to 50 mg nightly), Disp: 90 tablet, Rfl: 1   Probiotic Product (PROBIOTIC-10 PO), Take by mouth., Disp: , Rfl:    Semaglutide,0.25 or 0.5MG/DOS, 2 MG/1.5ML SOPN, Inject 0.5 mg into the skin every Sunday., Disp: , Rfl:    Sodium Sulfate-Mag Sulfate-KCl (SUTAB) [phone] MG TABS, As directed by GI offfice for colon prep, Disp: 24 tablet, Rfl: 0   temazepam (RESTORIL) 15 MG capsule, Take 15 mg by mouth at bedtime., Disp: , Rfl:    insulin aspart (NOVOLOG) 100 UNIT/ML injection, Use sliding scale provided at time of discharge, administer 3 times daily with meals (Patient taking differently: Inject 15-48 Units into the skin See admin instructions. Injection 40-48 units 3 times daily with meals and 15-35 units at night), Disp: 10 mL, Rfl: 0  EXAM:  VITALS per patient if applicable:  GENERAL: alert, oriented, appears well and in no acute distress  HEENT: atraumatic, conjunttiva clear, no obvious abnormalities on inspection of external nose and ears  NECK: normal movements of the head and neck  LUNGS: on inspection no signs of respiratory distress, breathing rate appears normal, no obvious gross SOB, gasping or wheezing  CV: no obvious cyanosis  MS: moves all visible extremities without noticeable abnormality  PSYCH/NEURO: pleasant and cooperative, no obvious depression or  anxiety, speech and thought processing grossly  intact  ASSESSMENT AND PLAN: Covid-19 infection. We will treat this with 5 days of Paxlovid. She may add Mucinex BID. Recheck as needed. Quarantine for 5 days.  [name], MD  Discussed the following assessment and plan:  No diagnosis found.     I discussed the assessment and treatment plan with the patient. The patient was provided an opportunity to ask questions and all were answered. The patient agreed with the plan and demonstrated an understanding of the instructions.   The patient was advised to call back or seek an in-person evaluation if the symptoms worsen or if the condition fails to improve as anticipated.      Review of Systems     Objective:   Physical Exam        Assessment & Plan:

## 2021-11-23 DIAGNOSIS — J449 Chronic obstructive pulmonary disease, unspecified: Secondary | ICD-10-CM | POA: Diagnosis not present

## 2021-11-23 DIAGNOSIS — J432 Centrilobular emphysema: Secondary | ICD-10-CM | POA: Diagnosis not present

## 2021-11-23 DIAGNOSIS — J9611 Chronic respiratory failure with hypoxia: Secondary | ICD-10-CM | POA: Diagnosis not present

## 2021-11-27 DIAGNOSIS — G4733 Obstructive sleep apnea (adult) (pediatric): Secondary | ICD-10-CM | POA: Diagnosis not present

## 2021-12-09 ENCOUNTER — Telehealth: Payer: Self-pay | Admitting: Gastroenterology

## 2021-12-09 NOTE — Telephone Encounter (Signed)
Patient called and stated that she is having a procedure on 3/9 at [facility]. Seeking advice if there is anything that she can do differently. She stated that she has a lot of bills. Please advise.    [phone]

## 2021-12-10 ENCOUNTER — Telehealth (INDEPENDENT_AMBULATORY_CARE_PROVIDER_SITE_OTHER): Payer: HMO | Admitting: Cardiology

## 2021-12-10 ENCOUNTER — Other Ambulatory Visit: Payer: Self-pay

## 2021-12-10 ENCOUNTER — Encounter: Payer: Self-pay | Admitting: Cardiology

## 2021-12-10 VITALS — Ht 60.0 in | Wt 265.0 lb

## 2021-12-10 DIAGNOSIS — E782 Mixed hyperlipidemia: Secondary | ICD-10-CM | POA: Diagnosis not present

## 2021-12-10 DIAGNOSIS — Z1211 Encounter for screening for malignant neoplasm of colon: Secondary | ICD-10-CM

## 2021-12-10 DIAGNOSIS — I10 Essential (primary) hypertension: Secondary | ICD-10-CM

## 2021-12-10 DIAGNOSIS — I2510 Atherosclerotic heart disease of native coronary artery without angina pectoris: Secondary | ICD-10-CM

## 2021-12-10 DIAGNOSIS — I5032 Chronic diastolic (congestive) heart failure: Secondary | ICD-10-CM | POA: Diagnosis not present

## 2021-12-10 NOTE — Progress Notes (Signed)
Virtual Visit via Video Note   This visit type was conducted due to national recommendations for restrictions regarding the COVID-19 Pandemic (e.g. social distancing) in an effort to limit this patient's exposure and mitigate transmission in our community.  Due to her co-morbid illnesses, this patient is at least at moderate risk for complications without adequate follow up.  This format is felt to be most appropriate for this patient at this time.  All issues noted in this document were discussed and addressed.  A limited physical exam was performed with this format.  Please refer to the patient's chart for her consent to telehealth for [facility].    Date:  12/10/2021   ID:  Bonnie Lynore, Gonzalez October 21, 1956, MRN [id number] The patient was identified using 2 identifiers.  Patient Location: Home Provider Location: Home Office   PCP:  Laurey Morale, MD   [facility] Providers Cardiologist:  Fransico Him, MD     Evaluation Performed:  Follow-Up Visit  Chief Complaint:  CHF/HTN  History of Present Illness:    Bonnie Gonzalez is a 66 y.o. female with with a hx of emphysema/COPD, chronic diastolic CHF, remote hx of tobacco use, chronic DOE and HTN who is referred today for evaluation and treatment of chronic diastolic CHF.  2D echo 2020 showed normal LVF with EF 60-65% with G1DD and no valvular heart disease.     When I saw her last she was having LE edema as well as DOE.  She attributed it to weight gain.   She uses a walker to ambulate.  She uses 3L O2 and CPAP at night.  She is very sedentary due to her significant lung disease. HRCT scan showed new low attenuation lesions in the liver concerning for hepatocellular CA or metastatic CA with cirrhosis.  2D echo 08/2021 showed normal LVF with EF 60-65% with G1DD.  BNP was normal at 9 last fall.  Coronary Ca score was 1 with minimal nonobstructive CAD (1-24% mixed plaque in the LAD) and small myocardial bridge in the mid LAD 33m.     She is here today for followup and is doing well.  She denies any chest pain or pressure, PND, orthopnea, LE edema,  palpitations. Occasionally she will have some lightheadedness but no syncope. She has chronic DOE that is stable on 2L O2.  She was on 3L O2 after COVID but it has been titrated back down.  She is compliant with her meds and is tolerating meds with no SE.     The patient does not have symptoms concerning for COVID-19 infection (fever, chills, cough, or new shortness of breath).    Past Medical History:  Diagnosis Date   Acquired hypothyroidism 10/01/2010   Allergic rhinitis 07/20/2009   Arthritis    Back pain 10/18/2015   Bilateral leg edema 03/31/2018   Centrilobular emphysema 02/04/2019   HRCT 09/15/18   Chronic diastolic CHF (congestive heart failure) 01/28/2019   Chronic respiratory failure with hypoxia 02/04/2019   [facility]   Chronic urticaria 12/21/2019   COPD (chronic obstructive pulmonary disease) 09/01/2018   Quit smoking 2010  - Spirometry 08/31/2018  FEV1 1.1 (51%)  Ratio 66 s prior rx with typical curvature  - 08/31/2018  After extensive coaching inhaler device,  effectiveness =    90% with elipta > try anoro sample > no better so d/c'  - PFT's  11/10/2018  FEV1 1.24 (59 % ) ratio 75 (ratio with fev1/VC =  63)  p no % improvement from saba p nothing prior to study with DLCO  105  % corrects to 132  %    DOE (dyspnea on exertion) 08/31/2018   Onset 05/2018 with background of unexplained leg swelling x 01/2018   -echo 04/13/18 just G1   diastolic dysfunction s PH   -  08/31/2018   Walked RA x one lap @ 185 stopped due to  Sob/ sats 89% at nl pace  - Spirometry 08/31/2018  FEV1 1.1 (51%)  Ratio 66 s prior rx  - 09/28/2018 could not do full pfts 09/28/2018  - Collagen vasc profile 09/28/18  For ESR =  54 (was 102 before prednisone)  Neg collag   Dyshidrotic eczema 12/13/2010   Essential hypertension 01/26/2019   Generalized anxiety disorder  with panic attacks 01/20/2018   GERD (gastroesophageal reflux disease) 11/03/2007   Headache 11/03/2007   Irritable bowel syndrome 07/13/2009   Major depressive disorder 07/17/2020   Mixed hyperlipidemia 07/20/2009   Obesity    OSA (obstructive sleep apnea) 07/26/2015   HST 08/2015 mild OSA, AHI 8/hour, lowest desaturation 81%, desaturations noted without respiratory events  Formatting of this note might be different from the original. PSG 05/20/19 AHI 25.1   Osteopenia of multiple sites 06/01/2019   DEXA 05/24/2019: Right femur neck T- 2.2, left femur neck T- 1.5, right total femur T -0.5, left total femur T -0.5, AP total spine T- 1.9. FRAX 10-year probability of major osteoporotic fracture is 8.8% and a hip fracture is 1.2%  DEXA 05/05/2019: Right femur neck T- 1.9, left femur neck T- 2.0, right total femur T -1.3, left total femur   Oxygen deficiency    Postinflammatory pulmonary fibrosis 09/15/2018   ESR    10/161/9  = 102   > rx short term prednisone ? slt improvement in symptoms HRCT 09/15/2018  1. Very mild basilar subpleural ground-glass, reticulation and traction bronchiolectasis, findings which may be minimally progressive from 06/29/2017 and are indicative of interstitial lung disease such as nonspecific interstitial pneumonitis. Findings are indeterminate for UIP per consensus guidelin   Psychophysiological insomnia 03/29/2019   Type 2 diabetes mellitus 02/09/2017   UTI (urinary tract infection)    Vertigo 03/29/2014   Vitamin D deficiency 09/15/2020   Past Surgical History:  Procedure Laterality Date   ANKLE FRACTURE SURGERY Right    benign tumor      removed from groin   WRIST ARTHROCENTESIS N/A 2001     No outpatient medications have been marked as taking for the 12/10/21 encounter (Video Visit) with Bonnie Margarita, MD.     Allergies:   Aspirin, Hydrocodone-acetaminophen, Oxycodone-acetaminophen, Penicillins, Codeine, Fluoxetine hcl, and Trazodone   Social History    Tobacco Use   Smoking status: Former    Packs/day: 1.00    Years: 35.00    Pack years: 35.00    Types: Cigarettes    Quit date: 12/01/2008    Years since quitting: 13.0   Smokeless tobacco: Never  Vaping Use   Vaping Use: Never used  Substance Use Topics   Alcohol use: Not Currently   Drug use: No     Family Hx: The patient's family history includes Alzheimer's disease in her father; Arthritis/Rheumatoid in her mother; Asthma in an other family member; Dementia in her father; Depression in an other family member; Diabetes in an other family member; Diabetes Mellitus II in her brother, father, and sister; Lupus in an other family member; Parkinsonism in an other family member.  ROS:   Please see the history of present illness.     All other systems reviewed and are negative.   Prior CV studies:   The following studies were reviewed today:  Coronary CTA, 2D echo  Labs/Other Tests and Data Reviewed:    EKG:  No ECG reviewed.  Recent Labs: 09/03/2021: BUN 12; Creatinine, Ser 0.92; Hemoglobin 12.8; NT-Pro BNP 9; Platelets 208; Potassium 4.5; Sodium 139; TSH 3.110 11/13/2021: ALT 44   Recent Lipid Panel Lab Results  Component Value Date/Time   CHOL 167 11/13/2021 09:14 AM   TRIG 106 11/13/2021 09:14 AM   HDL 38 (L) 11/13/2021 09:14 AM   CHOLHDL 4.4 11/13/2021 09:14 AM   CHOLHDL 6 04/10/2021 07:16 AM   LDLCALC 110 (H) 11/13/2021 09:14 AM   LDLDIRECT 176.0 04/10/2021 07:16 AM    Wt Readings from Last 3 Encounters:  12/10/21 265 lb (120.2 kg)  11/19/21 265 lb (120.2 kg)  11/11/21 267 lb (121.1 kg)     Risk Assessment/Calculations:          Objective:    Vital Signs:  Ht 5' (1.524 m)    Wt 265 lb (120.2 kg)    BMI 51.75 kg/m    VITAL SIGNS:  reviewed GEN:  no acute distress EYES:  sclerae anicteric, EOMI - Extraocular Movements Intact RESPIRATORY:  normal respiratory effort, symmetric expansion CARDIOVASCULAR:  no peripheral edema SKIN:  no rash, lesions  or ulcers. MUSCULOSKELETAL:  no obvious deformities. NEURO:  alert and oriented x 3, no obvious focal deficit PSYCH:  normal affect  ASSESSMENT & PLAN:    Chronic diastolic CHF/SOB -her last echo was 08/2021 which showed normal LVF with G1DD -she has had worsening SOB and difficult to sort out whether related to progressive lung disease  -BNP was normal -she appears euvolemic on exam today and weight is stable -Continue prescription drug management with Lasix 36m daily with PRN refills -SCR 0.92 and K+ 4.5 on 09/03/2021   2.  HTN -BP adequately controlled on exam today -she was on Cardizem but stopped it due to severe constipation -diet controlled  3.  ASCAD -EKG shows prior inferior and septal infarct -she has CP that may be related to her COPD but she has a hx of tobacco use -coronary CTA with minimal mixed plaque in the LAD and mid myocardial bridge 169m-would try to avoid Lexiscan myoview given her underlying significant COPD and she cannot walk on treadmill -she has not had any further CP and her SOB is stable -add ASA 8165maily and high dose statin  4. HLD -LDL goal < 70 -started recently on Crestor 44m67mily recently -repeat FLP and ALT in a few weeks     COVID-19 Education: The signs and symptoms of COVID-19 were discussed with the patient and how to seek care for testing (follow up with PCP or arrange E-visit).  The importance of social distancing was discussed today.  Time:   Today, I have spent 20 minutes with the patient with telehealth technology discussing the above problems.     Medication Adjustments/Labs and Tests Ordered: Current medicines are reviewed at length with the patient today.  Concerns regarding medicines are outlined above.   Tests Ordered: No orders of the defined types were placed in this encounter.   Medication Changes: No orders of the defined types were placed in this encounter.   Follow Up:  In Person in 1  year(s)  Signed, TracFransico Him  12/10/2021 2:42 PM  [facility]

## 2021-12-10 NOTE — Patient Instructions (Signed)
Medication Instructions:  Your physician recommends that you continue on your current medications as directed. Please refer to the Current Medication list given to you today.  Start Aspirin 81 mg Daily   *If you need a refill on your cardiac medications before your next appointment, please call your pharmacy*   Lab Work: Your physician recommends that you return for lab work in: End of January   If you have labs (blood work) drawn today and your tests are completely normal, you will receive your results only by: [employer] (if you have MyChart) OR A paper copy in the mail If you have any lab test that is abnormal or we need to change your treatment, we will call you to review the results.   Testing/Procedures: NONE    Follow-Up: At [facility], you and your health needs are our priority.  As part of our continuing mission to provide you with exceptional heart care, we have created designated Provider Care Teams.  These Care Teams include your primary Cardiologist (physician) and Advanced Practice Providers (APPs -  Physician Assistants and Nurse Practitioners) who all work together to provide you with the care you need, when you need it.  We recommend signing up for the patient portal called "MyChart".  Sign up information is provided on this After Visit Summary.  MyChart is used to connect with patients for Virtual Visits (Telemedicine).  Patients are able to view lab/test results, encounter notes, upcoming appointments, etc.  Non-urgent messages can be sent to your provider as well.   To learn more about what you can do with MyChart, go to [email or web address].    Your next appointment:   1 year(s)  The format for your next appointment:   In Person  Provider:   Fransico Him, MD     Other Instructions Thank you for choosing [facility]!

## 2021-12-10 NOTE — Telephone Encounter (Signed)
Okay to proceed with cologaurd test for average risk colorectal cancer screening.  Please request it, will need recall cologaurd in 3 years if negative but if it is positive will need follow-up colonoscopy.  Thanks

## 2021-12-10 NOTE — Telephone Encounter (Signed)
Patient would like to do the Cologuard test.

## 2021-12-10 NOTE — Telephone Encounter (Signed)
Order for the Cologuard test placed. Patient notified.

## 2021-12-10 NOTE — Addendum Note (Signed)
Addended by: Levonne Hubert on: 12/10/2021 04:02 PM   Modules accepted: Orders

## 2021-12-20 ENCOUNTER — Telehealth: Payer: Self-pay | Admitting: Family Medicine

## 2021-12-20 NOTE — Telephone Encounter (Signed)
disregard

## 2021-12-24 DIAGNOSIS — J9611 Chronic respiratory failure with hypoxia: Secondary | ICD-10-CM | POA: Diagnosis not present

## 2021-12-24 DIAGNOSIS — J449 Chronic obstructive pulmonary disease, unspecified: Secondary | ICD-10-CM | POA: Diagnosis not present

## 2021-12-24 DIAGNOSIS — J432 Centrilobular emphysema: Secondary | ICD-10-CM | POA: Diagnosis not present

## 2022-01-01 DIAGNOSIS — Z79899 Other long term (current) drug therapy: Secondary | ICD-10-CM | POA: Diagnosis not present

## 2022-01-01 DIAGNOSIS — F5101 Primary insomnia: Secondary | ICD-10-CM | POA: Diagnosis not present

## 2022-01-01 DIAGNOSIS — F330 Major depressive disorder, recurrent, mild: Secondary | ICD-10-CM | POA: Diagnosis not present

## 2022-01-01 DIAGNOSIS — F411 Generalized anxiety disorder: Secondary | ICD-10-CM | POA: Diagnosis not present

## 2022-01-09 ENCOUNTER — Encounter: Payer: Self-pay | Admitting: Adult Health

## 2022-01-09 ENCOUNTER — Ambulatory Visit (INDEPENDENT_AMBULATORY_CARE_PROVIDER_SITE_OTHER): Payer: HMO | Admitting: Adult Health

## 2022-01-09 VITALS — BP 128/80 | HR 91 | Temp 98.5°F

## 2022-01-09 DIAGNOSIS — R0602 Shortness of breath: Secondary | ICD-10-CM | POA: Diagnosis not present

## 2022-01-09 DIAGNOSIS — R059 Cough, unspecified: Secondary | ICD-10-CM

## 2022-01-09 DIAGNOSIS — J441 Chronic obstructive pulmonary disease with (acute) exacerbation: Secondary | ICD-10-CM

## 2022-01-09 LAB — POCT INFLUENZA A/B
Influenza A, POC: NEGATIVE
Influenza B, POC: NEGATIVE

## 2022-01-09 LAB — POC COVID19 BINAXNOW: SARS Coronavirus 2 Ag: NEGATIVE

## 2022-01-09 MED ORDER — IPRATROPIUM-ALBUTEROL 0.5-2.5 (3) MG/3ML IN SOLN
3.0000 mL | Freq: Once | RESPIRATORY_TRACT | Status: AC
  Administered 2022-01-09: 3 mL via RESPIRATORY_TRACT

## 2022-01-09 MED ORDER — METHYLPREDNISOLONE ACETATE 40 MG/ML IJ SUSP
40.0000 mg | Freq: Once | INTRAMUSCULAR | Status: AC
  Administered 2022-01-09: 40 mg via INTRAMUSCULAR

## 2022-01-09 MED ORDER — DOXYCYCLINE HYCLATE 100 MG PO CAPS: 100.0000 mg | ORAL_CAPSULE | Freq: Two times a day (BID) | ORAL | 0 refills | Status: DC

## 2022-01-09 MED ORDER — METHYLPREDNISOLONE ACETATE 80 MG/ML IJ SUSP
80.0000 mg | Freq: Once | INTRAMUSCULAR | Status: AC
  Administered 2022-01-09: 80 mg via INTRAMUSCULAR

## 2022-01-09 NOTE — Progress Notes (Signed)
Subjective:    Patient ID: Bonnie Gonzalez, female    DOB: 1956-01-30, 66 y.o.   MRN: [id number]  HPI  66 year old female who  has a past medical history of Acquired hypothyroidism (10/01/2010), Allergic rhinitis (07/20/2009), Arthritis, Back pain (10/18/2015), Bilateral leg edema (03/31/2018), Centrilobular emphysema (02/04/2019), Chronic diastolic CHF (congestive heart failure) (01/28/2019), Chronic respiratory failure with hypoxia (02/04/2019), Chronic urticaria (12/21/2019), COPD (chronic obstructive pulmonary disease) (09/01/2018), DOE (dyspnea on exertion) (08/31/2018), Dyshidrotic eczema (12/13/2010), Essential hypertension (01/26/2019), Generalized anxiety disorder with panic attacks (01/20/2018), GERD (gastroesophageal reflux disease) (11/03/2007), Headache (11/03/2007), Irritable bowel syndrome (07/13/2009), Major depressive disorder (07/17/2020), Mixed hyperlipidemia (07/20/2009), Obesity, OSA (obstructive sleep apnea) (07/26/2015), Osteopenia of multiple sites (06/01/2019), Oxygen deficiency, Postinflammatory pulmonary fibrosis (09/15/2018), Psychophysiological insomnia (03/29/2019), Type 2 diabetes mellitus (02/09/2017), UTI (urinary tract infection), Vertigo (03/29/2014), and Vitamin D deficiency (09/15/2020).  She is a patient of Dr. Sarajane Jews who I am seeing today for shortness of breath and coughing. She has a significant history of COPD/emphysema and chronic diastolic CHF.   She reports that over the " last 5 days I have been having a harder time breathing, been wheezing more and my cough is worse".   She has been using her inhalers which she reports notices improvement just with albuterol inhaler.   She has increased her oxygen from 2 L to 3 L.   She has had diaphoresis and mild chills from time to time.   BP Readings from Last 3 Encounters:  01/09/22 128/80  11/11/21 122/76  10/15/21 120/74   Lab Results  Component Value Date   BUN 12 09/03/2021   Has not noticed any  increased swelling in her lower extremities.   Denies chest pain   Review of Systems See HPI   Past Medical History:  Diagnosis Date   Acquired hypothyroidism 10/01/2010   Allergic rhinitis 07/20/2009   Arthritis    Back pain 10/18/2015   Bilateral leg edema 03/31/2018   Centrilobular emphysema 02/04/2019   HRCT 09/15/18   Chronic diastolic CHF (congestive heart failure) 01/28/2019   Chronic respiratory failure with hypoxia 02/04/2019   6 Min Walk 08/31/18 at [facility]   Chronic urticaria 12/21/2019   COPD (chronic obstructive pulmonary disease) 09/01/2018   Quit smoking 2010  - Spirometry 08/31/2018  FEV1 1.1 (51%)  Ratio 66 s prior rx with typical curvature  - 08/31/2018  After extensive coaching inhaler device,  effectiveness =    90% with elipta > try anoro sample > no better so d/c'  - PFT's  11/10/2018  FEV1 1.24 (59 % ) ratio 75 (ratio with fev1/VC =  63)  p no % improvement from saba p nothing prior to study with DLCO  105  % corrects to 132  %    DOE (dyspnea on exertion) 08/31/2018   Onset 05/2018 with background of unexplained leg swelling x 01/2018   -echo 04/13/18 just G1   diastolic dysfunction s PH   -  08/31/2018   Walked RA x one lap @ 185 stopped due to  Sob/ sats 89% at nl pace  - Spirometry 08/31/2018  FEV1 1.1 (51%)  Ratio 66 s prior rx  - 09/28/2018 could not do full pfts 09/28/2018  - Collagen vasc profile 09/28/18  For ESR =  54 (was 102 before prednisone)  Neg collag   Dyshidrotic eczema 12/13/2010   Essential hypertension 01/26/2019   Generalized anxiety disorder with panic attacks 01/20/2018   GERD (gastroesophageal reflux disease) 11/03/2007  Headache 11/03/2007   Irritable bowel syndrome 07/13/2009   Major depressive disorder 07/17/2020   Mixed hyperlipidemia 07/20/2009   Obesity    OSA (obstructive sleep apnea) 07/26/2015   HST 08/2015 mild OSA, AHI 8/hour, lowest desaturation 81%, desaturations noted without respiratory events  Formatting of this note  might be different from the original. PSG 05/20/19 AHI 25.1   Osteopenia of multiple sites 06/01/2019   DEXA 05/24/2019: Right femur neck T- 2.2, left femur neck T- 1.5, right total femur T -0.5, left total femur T -0.5, AP total spine T- 1.9. FRAX 10-year probability of major osteoporotic fracture is 8.8% and a hip fracture is 1.2%  DEXA 05/05/2019: Right femur neck T- 1.9, left femur neck T- 2.0, right total femur T -1.3, left total femur   Oxygen deficiency    Postinflammatory pulmonary fibrosis 09/15/2018   ESR    10/161/9  = 102   > rx short term prednisone ? slt improvement in symptoms HRCT 09/15/2018  1. Very mild basilar subpleural ground-glass, reticulation and traction bronchiolectasis, findings which may be minimally progressive from 06/29/2017 and are indicative of interstitial lung disease such as nonspecific interstitial pneumonitis. Findings are indeterminate for UIP per consensus guidelin   Psychophysiological insomnia 03/29/2019   Type 2 diabetes mellitus 02/09/2017   UTI (urinary tract infection)    Vertigo 03/29/2014   Vitamin D deficiency 09/15/2020    Social History   Socioeconomic History   Marital status: Divorced    Spouse name: Not on file   Number of children: 4   Years of education: 13   Highest education level: Some college, no degree  Occupational History    Comment: retired  Tobacco Use   Smoking status: Former    Packs/day: 1.00    Years: 35.00    Pack years: 35.00    Types: Cigarettes    Quit date: 12/01/2008    Years since quitting: 13.1   Smokeless tobacco: Never  Vaping Use   Vaping Use: Never used  Substance and Sexual Activity   Alcohol use: Not Currently   Drug use: No   Sexual activity: Not Currently  Other Topics Concern   Not on file  Social History Narrative   Lives alone.   She has four grown children.   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   Highest level of education:  1.5 years of college      Right  Handed   Is on 2 L of oxygen    Social Determinants of Health   Financial Resource Strain: Not on file  Food Insecurity: Not on file  Transportation Needs: Not on file  Physical Activity: Not on file  Stress: Not on file  Social Connections: Not on file  Intimate Partner Violence: Not on file    Past Surgical History:  Procedure Laterality Date   ANKLE FRACTURE SURGERY Right    benign tumor      removed from groin   WRIST ARTHROCENTESIS N/A 2001    Family History  Problem Relation Age of Onset   Arthritis/Rheumatoid Mother    Alzheimer's disease Father    Diabetes Mellitus II Father    Dementia Father    Diabetes Mellitus II Sister    Diabetes Mellitus II Brother    Asthma Other        fhx   Depression Other        fhx   Diabetes Other        fhx  Parkinsonism Other        fhx   Lupus Other        fhx    Allergies  Allergen Reactions   Aspirin     Mother had to be resuscitated after ingesting this   Hydrocodone-Acetaminophen Hives   Oxycodone-Acetaminophen Hives   Penicillins Hives    Did it involve swelling of the face/tongue/throat, SOB, or low BP? Yes-hives Did it involve sudden or severe rash/hives, skin peeling, or any reaction on the inside of your mouth or nose? Unk Did you need to seek medical attention at a hospital or doctor's office? Yes When did it last happen? Was in her 34's If all above answers are "NO", may proceed with cephalosporin use.  Did it involve swelling of the face/tongue/throat, SOB, or low BP? Yes-hives Did it involve sudden or severe rash/hives, skin peeling, or any reaction on the inside of your mouth or nose? Unk Did you need to seek medical attention at a hospital or doctor's office? Yes When did it last happen? Was in her 89's If all above answers are "NO", may proceed with cephalosporin use.   Codeine Hives   Fluoxetine Hcl Itching and Other (See Comments)    "Hair itching"   Trazodone Other (See Comments)    Couldn't  walk    Current Outpatient Medications on File Prior to Visit  Medication Sig Dispense Refill   albuterol (PROVENTIL HFA;VENTOLIN HFA) 108 (90 Base) MCG/ACT inhaler Inhale 2 puffs into the lungs every 4 (four) hours as needed for wheezing or shortness of breath. 1 Inhaler 11   ALPRAZolam (XANAX) 0.5 MG tablet TAKE 1 TABLET (0.5 MG TOTAL) BY MOUTH 2 (TWO) TIMES DAILY AS NEEDED. (Patient taking differently: Take 0.25 mg by mouth See admin instructions. Take 0.25 mg at night, may take an additional 0.25 to 0.5 mg dose during the day as needed for anxiety) 60 tablet 5   atorvastatin (LIPITOR) 80 MG tablet Take 1 tablet (80 mg total) by mouth daily. 90 tablet 3   azelastine (ASTELIN) 0.1 % nasal spray Place 1 spray into the nose 2 (two) times daily as needed for allergies.     benzonatate (TESSALON) 200 MG capsule Take 1 capsule (200 mg total) by mouth every 8 (eight) hours as needed for cough. 60 capsule 5   Cholecalciferol 50 MCG (2000 UT) CAPS Take 1 capsule by mouth daily.     Continuous Blood Gluc Sensor (FREESTYLE LIBRE 14 DAY SENSOR) MISC by Does not apply route.     diltiazem (CARDIZEM CD) 120 MG 24 hr capsule Take 1 capsule (120 mg total) by mouth daily. (Patient not taking: Reported on 12/10/2021) 90 capsule 3   DULoxetine (CYMBALTA) 60 MG capsule Take 1 capsule (60 mg total) by mouth daily. (Patient taking differently: Take 120 mg by mouth at bedtime.) 90 capsule 3   esomeprazole (NEXIUM) 20 MG capsule Take 20 mg by mouth daily.     Fluticasone-Umeclidin-Vilant (TRELEGY ELLIPTA) 100-62.5-25 MCG/INH AEPB Inhale 1 puff into the lungs daily.     furosemide (LASIX) 40 MG tablet Take 1 tablet (40 mg total) by mouth daily. (Patient taking differently: Take 20 mg by mouth daily.) 90 tablet 3   glucose 4 GM chewable tablet Chew 1 tablet (4 g total) by mouth as needed for low blood sugar (< 70). 50 tablet 0   hydrOXYzine (VISTARIL) 25 MG capsule Take 1 capsule (25 mg total) by mouth See admin  instructions. Take 1 capsule by mouth three  times daily as needed. (Patient taking differently: Take 25 mg by mouth 3 (three) times daily.) 90 capsule 5   Hyprom-Naphaz-Polysorb-Zn Sulf (CLEAR EYES COMPLETE) SOLN Place 1-2 drops into both eyes as needed (for itching).     insulin aspart (NOVOLOG) 100 UNIT/ML injection Use sliding scale provided at time of discharge, administer 3 times daily with meals (Patient taking differently: Inject 15-48 Units into the skin See admin instructions. Injection 40-48 units 3 times daily with meals and 15-35 units at night) 10 mL 0   insulin glargine, 2 Unit Dial, (TOUJEO MAX SOLOSTAR) 300 UNIT/ML Solostar Pen Inject 75 Units into the skin 2 (two) times daily.     Insulin Pen Needle (B-D UF III MINI PEN NEEDLES) 31G X 5 MM MISC Use to administer insulin five times a day     levothyroxine (SYNTHROID) 175 MCG tablet TAKE 1 TABLET BY MOUTH ONCE DAILY BEFORE BREAKFAST 90 tablet 3   meclizine (ANTIVERT) 50 MG tablet Take 0.5 tablets (25 mg total) by mouth 3 (three) times daily as needed. (Patient taking differently: Take 25 mg by mouth 3 (three) times daily as needed for dizziness.) 30 tablet 0   methocarbamol (ROBAXIN) 500 MG tablet Take 1 tablet (500 mg total) by mouth every 6 (six) hours as needed for muscle spasms. 120 tablet 5   naproxen (NAPROSYN) 500 MG tablet Take 1 tablet (500 mg total) by mouth 2 (two) times daily. 60 tablet 11   nystatin (MYCOSTATIN/NYSTOP) powder Apply 1 application topically 2 (two) times daily as needed (rash).     OXYGEN Inhale into the lungs. 2 liters 24/7     primidone (MYSOLINE) 50 MG tablet Take 1 tablet (50 mg total) by mouth at bedtime. (Patient taking differently: Take 25-50 mg by mouth See admin instructions. Take 25 mg nightly for 7 days then increase to 50 mg nightly) 90 tablet 1   Probiotic Product (PROBIOTIC-10 PO) Take by mouth.     Semaglutide,0.25 or 0.5MG/DOS, 2 MG/1.5ML SOPN Inject 0.5 mg into the skin every Sunday.      Sodium Sulfate-Mag Sulfate-KCl (SUTAB) [phone] MG TABS As directed by GI offfice for colon prep 24 tablet 0   temazepam (RESTORIL) 15 MG capsule Take 15 mg by mouth at bedtime.     No current facility-administered medications on file prior to visit.    BP 128/80    Pulse 91    Temp 98.5 F (36.9 C) (Oral)    SpO2 93%       Objective:   Physical Exam Vitals and nursing note reviewed.  Constitutional:      Appearance: Normal appearance.  Cardiovascular:     Rate and Rhythm: Normal rate and regular rhythm.     Pulses: Normal pulses.     Heart sounds: Normal heart sounds.  Pulmonary:     Breath sounds: No stridor. Examination of the right-upper field reveals wheezing. Examination of the left-upper field reveals wheezing. Examination of the right-middle field reveals wheezing. Examination of the left-middle field reveals wheezing. Examination of the right-lower field reveals wheezing. Examination of the left-lower field reveals wheezing. Wheezing present. No rhonchi or rales.  Neurological:     Mental Status: She is alert.      Assessment & Plan:  1. COPD exacerbation ([place]) -Patient given DuoNeb in the office today.  Upon completion of DuoNeb patient endorsed feeling as though she could breathe easier, was not coughing as much, can no longer hear the wheezing.  On exam she had very  trace expiratory wheezes which was significant improvement from prior to [facility] in which she had very harsh inspiratory and expiratory wheezing throughout.  We will place on doxycycline as she does not feel as though azithromycin works well for her.  Also given shot of Depo-Medrol in the office today, she was advised that this will raise her blood sugars but I feel as though the benefit outweighs the risk.  Advise follow-up with her PCP or pulmonologist for further evaluation - ipratropium-albuterol (DUONEB) 0.5-2.5 (3) MG/3ML nebulizer solution 3 mL - doxycycline (VIBRAMYCIN) 100 MG capsule; Take 1 capsule  (100 mg total) by mouth 2 (two) times daily.  Dispense: 14 capsule; Refill: 0 - methylPREDNISolone acetate (DEPO-MEDROL) injection 80 mg - methylPREDNISolone acetate (DEPO-MEDROL) injection 40 mg  2. Cough, unspecified type  - POC COVID-19- negative  - POC Influenza A/B- negative   3. SOB (shortness of breath)  - POC COVID-19 - POC Influenza A/B  Dorothyann Peng, NP

## 2022-01-09 NOTE — Patient Instructions (Signed)
It was great seeing you today   You had a COPD exacerbation  I treated you with a nebulizer treatment and sent in an antibiotic called doxycycline.   You also received a steroid injection today in the office   Please follow up with [name] or your pulmonologist or further treatment

## 2022-01-21 DIAGNOSIS — J432 Centrilobular emphysema: Secondary | ICD-10-CM | POA: Diagnosis not present

## 2022-01-21 DIAGNOSIS — J40 Bronchitis, not specified as acute or chronic: Secondary | ICD-10-CM | POA: Diagnosis not present

## 2022-01-21 DIAGNOSIS — J449 Chronic obstructive pulmonary disease, unspecified: Secondary | ICD-10-CM | POA: Diagnosis not present

## 2022-01-24 DIAGNOSIS — J432 Centrilobular emphysema: Secondary | ICD-10-CM | POA: Diagnosis not present

## 2022-01-24 DIAGNOSIS — J449 Chronic obstructive pulmonary disease, unspecified: Secondary | ICD-10-CM | POA: Diagnosis not present

## 2022-01-24 DIAGNOSIS — J9611 Chronic respiratory failure with hypoxia: Secondary | ICD-10-CM | POA: Diagnosis not present

## 2022-01-27 DIAGNOSIS — J441 Chronic obstructive pulmonary disease with (acute) exacerbation: Secondary | ICD-10-CM | POA: Diagnosis not present

## 2022-02-05 ENCOUNTER — Encounter: Payer: Self-pay | Admitting: Gastroenterology

## 2022-02-05 NOTE — Telephone Encounter (Signed)
I believe Dr Lorenso Courier recommended as patient was unable to prep. Please order Cologuard. Thanks ?

## 2022-02-06 ENCOUNTER — Other Ambulatory Visit: Payer: Self-pay | Admitting: *Deleted

## 2022-02-06 ENCOUNTER — Encounter (HOSPITAL_COMMUNITY): Payer: Self-pay

## 2022-02-06 ENCOUNTER — Other Ambulatory Visit: Payer: Self-pay

## 2022-02-06 ENCOUNTER — Ambulatory Visit (HOSPITAL_COMMUNITY): Admit: 2022-02-06 | Payer: HMO | Admitting: Gastroenterology

## 2022-02-06 DIAGNOSIS — Z1211 Encounter for screening for malignant neoplasm of colon: Secondary | ICD-10-CM

## 2022-02-06 SURGERY — COLONOSCOPY WITH PROPOFOL
Anesthesia: Monitor Anesthesia Care

## 2022-02-06 NOTE — Progress Notes (Signed)
I just reordered pts cologuard. I will inform her we have sent the order in again  ?

## 2022-02-12 DIAGNOSIS — Z794 Long term (current) use of insulin: Secondary | ICD-10-CM | POA: Diagnosis not present

## 2022-02-12 DIAGNOSIS — I10 Essential (primary) hypertension: Secondary | ICD-10-CM | POA: Diagnosis not present

## 2022-02-12 DIAGNOSIS — Z7985 Long-term (current) use of injectable non-insulin antidiabetic drugs: Secondary | ICD-10-CM | POA: Diagnosis not present

## 2022-02-12 DIAGNOSIS — E1140 Type 2 diabetes mellitus with diabetic neuropathy, unspecified: Secondary | ICD-10-CM | POA: Diagnosis not present

## 2022-02-12 DIAGNOSIS — E669 Obesity, unspecified: Secondary | ICD-10-CM | POA: Diagnosis not present

## 2022-02-12 DIAGNOSIS — Z6841 Body Mass Index (BMI) 40.0 and over, adult: Secondary | ICD-10-CM | POA: Diagnosis not present

## 2022-02-12 DIAGNOSIS — E1169 Type 2 diabetes mellitus with other specified complication: Secondary | ICD-10-CM | POA: Diagnosis not present

## 2022-02-12 DIAGNOSIS — E8881 Metabolic syndrome: Secondary | ICD-10-CM | POA: Diagnosis not present

## 2022-02-13 DIAGNOSIS — Z1211 Encounter for screening for malignant neoplasm of colon: Secondary | ICD-10-CM | POA: Diagnosis not present

## 2022-02-18 DIAGNOSIS — J449 Chronic obstructive pulmonary disease, unspecified: Secondary | ICD-10-CM | POA: Diagnosis not present

## 2022-02-20 LAB — COLOGUARD: COLOGUARD: POSITIVE — AB

## 2022-02-21 DIAGNOSIS — J9611 Chronic respiratory failure with hypoxia: Secondary | ICD-10-CM | POA: Diagnosis not present

## 2022-02-21 DIAGNOSIS — J432 Centrilobular emphysema: Secondary | ICD-10-CM | POA: Diagnosis not present

## 2022-02-21 DIAGNOSIS — J449 Chronic obstructive pulmonary disease, unspecified: Secondary | ICD-10-CM | POA: Diagnosis not present

## 2022-02-28 ENCOUNTER — Ambulatory Visit (INDEPENDENT_AMBULATORY_CARE_PROVIDER_SITE_OTHER): Payer: HMO | Admitting: Family Medicine

## 2022-02-28 ENCOUNTER — Encounter: Payer: Self-pay | Admitting: Family Medicine

## 2022-02-28 VITALS — BP 138/78 | HR 75 | Temp 98.6°F | Ht 60.0 in | Wt 277.0 lb

## 2022-02-28 DIAGNOSIS — Z0000 Encounter for general adult medical examination without abnormal findings: Secondary | ICD-10-CM | POA: Diagnosis not present

## 2022-02-28 NOTE — Progress Notes (Signed)
? ?Subjective:  ? ? Patient ID: Bonnie Gonzalez, female    DOB: 1956/06/25, 66 y.o.   MRN: [id number] ? ?HPI ?Here for a well exam. She is doing fairly well considering her issues. She sees Dr. Worthy Keeler at [facility] for COPD, she sees Dr. Jacolyn Reedy at [facility] for diabetes and hypothyroidism, she sees Dr. Lendon Colonel at [facility] for anxiety and depression, and she sees Dr. Alfonzo Feller for CHF and HTN. She does ask me to check a lesion on the right lower abdomen that appeared one year ago. Lately this has become very irritated.  ? ? ?Review of Systems  ?Constitutional:  Positive for fatigue.  ?HENT: Negative.    ?Eyes: Negative.   ?Respiratory:  Positive for shortness of breath and wheezing.   ?Cardiovascular:  Positive for leg swelling. Negative for chest pain and palpitations.  ?Gastrointestinal: Negative.   ?Genitourinary:  Negative for decreased urine volume, difficulty urinating, dyspareunia, dysuria, enuresis, flank pain, frequency, hematuria, pelvic pain and urgency.  ?Musculoskeletal: Negative.   ?Skin: Negative.   ?Neurological: Negative.  Negative for headaches.  ?Psychiatric/Behavioral: Negative.    ? ?   ?Objective:  ? Physical Exam ?Constitutional:   ?   General: She is not in acute distress. ?   Appearance: She is well-developed. She is obese.  ?   Comments: Wearing [place] oxygen at 2 liters of flow, sitting in her wheelchair   ?HENT:  ?   Head: Normocephalic and atraumatic.  ?   Right Ear: External ear normal.  ?   Left Ear: External ear normal.  ?   Nose: Nose normal.  ?   Mouth/Throat:  ?   Pharynx: No oropharyngeal exudate.  ?Eyes:  ?   General: No scleral icterus. ?   Conjunctiva/sclera: Conjunctivae normal.  ?   Pupils: Pupils are equal, round, and reactive to light.  ?Neck:  ?   Thyroid: No thyromegaly.  ?   Vascular: No JVD.  ?Cardiovascular:  ?   Rate and Rhythm: Normal rate and regular rhythm.  ?   Heart sounds: Normal heart sounds. No murmur heard. ?  No  friction rub. No gallop.  ?Pulmonary:  ?   Effort: Pulmonary effort is normal. No respiratory distress.  ?   Breath sounds: Normal breath sounds. No wheezing or rales.  ?Chest:  ?   Chest wall: No tenderness.  ?Abdominal:  ?   General: Bowel sounds are normal. There is no distension.  ?   Palpations: Abdomen is soft. There is no mass.  ?   Tenderness: There is no abdominal tenderness. There is no guarding or rebound.  ?Musculoskeletal:     ?   General: No tenderness. Normal range of motion.  ?   Cervical back: Normal range of motion and neck supple.  ?Lymphadenopathy:  ?   Cervical: No cervical adenopathy.  ?Skin: ?   General: Skin is warm and dry.  ?   Findings: No erythema or rash.  ?   Comments: There is a pedunculated skin lesion on the right lower abdomen which is inflamed   ?Neurological:  ?   Mental Status: She is alert and oriented to person, place, and time.  ?   Cranial Nerves: No cranial nerve deficit.  ?   Motor: No abnormal muscle tone.  ?   Coordination: Coordination normal.  ?   Deep Tendon Reflexes: Reflexes are normal and symmetric. Reflexes normal.  ?Psychiatric:     ?   Behavior:  Behavior normal.     ?   Thought Content: Thought content normal.     ?   Judgment: Judgment normal.  ? ? ? ? ? ?   ?Assessment & Plan:  ?Well exam. We discussed diet and exercise. She will set up fasting labs for next week. She will schedule a time next week for [name] to excise the lesion off her abdomen. ?[name], MD ? ? ?

## 2022-03-04 ENCOUNTER — Encounter: Payer: Self-pay | Admitting: Family Medicine

## 2022-03-04 ENCOUNTER — Ambulatory Visit (INDEPENDENT_AMBULATORY_CARE_PROVIDER_SITE_OTHER): Payer: HMO | Admitting: Family Medicine

## 2022-03-04 VITALS — Wt 277.0 lb

## 2022-03-04 DIAGNOSIS — L989 Disorder of the skin and subcutaneous tissue, unspecified: Secondary | ICD-10-CM

## 2022-03-04 DIAGNOSIS — Z0000 Encounter for general adult medical examination without abnormal findings: Secondary | ICD-10-CM

## 2022-03-04 DIAGNOSIS — E782 Mixed hyperlipidemia: Secondary | ICD-10-CM

## 2022-03-04 DIAGNOSIS — B078 Other viral warts: Secondary | ICD-10-CM

## 2022-03-04 DIAGNOSIS — E039 Hypothyroidism, unspecified: Secondary | ICD-10-CM

## 2022-03-04 DIAGNOSIS — E119 Type 2 diabetes mellitus without complications: Secondary | ICD-10-CM

## 2022-03-04 LAB — LIPID PANEL
Cholesterol: 131 mg/dL (ref 0–200)
HDL: 42.1 mg/dL (ref >39.00)
LDL Cholesterol: 73 mg/dL (ref 0–99)
NonHDL: 89.37
Total CHOL/HDL Ratio: 3
Triglycerides: 81 mg/dL (ref 0.0–149.0)
VLDL: 16.2 mg/dL (ref 0.0–40.0)

## 2022-03-04 LAB — CBC WITH DIFFERENTIAL/PLATELET
Basophils Absolute: 0 10*3/uL (ref 0.0–0.1)
Basophils Relative: 0.3 % (ref 0.0–3.0)
Eosinophils Absolute: 0.2 10*3/uL (ref 0.0–0.7)
Eosinophils Relative: 1.6 % (ref 0.0–5.0)
HCT: 38.1 % (ref 36.0–46.0)
Hemoglobin: 12 g/dL (ref 12.0–15.0)
Lymphocytes Relative: 23.5 % (ref 12.0–46.0)
Lymphs Abs: 2.3 10*3/uL (ref 0.7–4.0)
MCHC: 31.6 g/dL (ref 30.0–36.0)
MCV: 85.2 fl (ref 78.0–100.0)
Monocytes Absolute: 0.6 10*3/uL (ref 0.1–1.0)
Monocytes Relative: 6.3 % (ref 3.0–12.0)
Neutro Abs: 6.8 10*3/uL (ref 1.4–7.7)
Neutrophils Relative %: 68.3 % (ref 43.0–77.0)
Platelets: 196 10*3/uL (ref 150.0–400.0)
RBC: 4.48 Mil/uL (ref 3.87–5.11)
RDW: 16 % — ABNORMAL HIGH (ref 11.5–15.5)
WBC: 9.9 10*3/uL (ref 4.0–10.5)

## 2022-03-04 LAB — BASIC METABOLIC PANEL
BUN: 14 mg/dL (ref 6–23)
CO2: 37 mEq/L — ABNORMAL HIGH (ref 19–32)
Calcium: 9.6 mg/dL (ref 8.4–10.5)
Chloride: 95 mEq/L — ABNORMAL LOW (ref 96–112)
Creatinine, Ser: 0.67 mg/dL (ref 0.40–1.20)
GFR: 91.44 mL/min (ref >60.00)
Glucose, Bld: 160 mg/dL — ABNORMAL HIGH (ref 70–99)
Potassium: 4.3 mEq/L (ref 3.5–5.1)
Sodium: 137 mEq/L (ref 135–145)

## 2022-03-04 LAB — TSH: TSH: 2.87 u[IU]/mL (ref 0.35–5.50)

## 2022-03-04 LAB — HEPATIC FUNCTION PANEL
ALT: 29 U/L (ref 0–35)
AST: 25 U/L (ref 0–37)
Albumin: 3.9 g/dL (ref 3.5–5.2)
Alkaline Phosphatase: 92 U/L (ref 39–117)
Bilirubin, Direct: 0.1 mg/dL (ref 0.0–0.3)
Total Bilirubin: 0.4 mg/dL (ref 0.2–1.2)
Total Protein: 7.2 g/dL (ref 6.0–8.3)

## 2022-03-04 LAB — HEMOGLOBIN A1C: Hgb A1c MFr Bld: 7.5 % — ABNORMAL HIGH (ref 4.6–6.5)

## 2022-03-04 NOTE — Addendum Note (Signed)
Addended by: Wyvonne Lenz on: 03/04/2022 02:53 PM ? ? Modules accepted: Orders ? ?

## 2022-03-04 NOTE — Progress Notes (Signed)
? ?  Subjective:  ? ? Patient ID: Bonnie Gonzalez, female    DOB: 24-Nov-1956, 66 y.o.   MRN: [id number] ? ?HPI ?Here to remove a lesion from the right groin. We looked this during her recent well exam.  ? ? ?Review of Systems  ?Constitutional: Negative.   ? ?   ?Objective:  ? Physical Exam ?Constitutional:   ?   Appearance: Normal appearance.  ?Skin: ?   Comments: There is a 8 mm pedunculated inflamed lesion in the right groin.   ?Neurological:  ?   Mental Status: She is alert.  ? ? ? ? ? ?   ?Assessment & Plan:  ?After informed consent was obtained, we cleansed the area with Betadine and LA was achieved using 1% Xylocaine with epinephrine. The lesion was then removed using a scalpel and will be sent to pathology. Bleeding was stopped using silver nitrate sticks. The wound was dressed with Neosporin and gauze. She tolerated the procedure well. Follow up is as needed.  ?[name], MD ? ? ?

## 2022-03-05 ENCOUNTER — Ambulatory Visit: Payer: HMO | Admitting: Cardiology

## 2022-03-12 ENCOUNTER — Telehealth: Payer: Self-pay | Admitting: Family Medicine

## 2022-03-12 NOTE — Telephone Encounter (Signed)
Bonnie Gonzalez [employer] is calling and need a letter with the reason she needs wheel chair its ok to put in EPIC ?

## 2022-03-13 NOTE — Telephone Encounter (Signed)
The letter is in Epic now  ?

## 2022-03-14 ENCOUNTER — Telehealth: Payer: Self-pay

## 2022-03-14 ENCOUNTER — Other Ambulatory Visit: Payer: Self-pay

## 2022-03-14 DIAGNOSIS — R195 Other fecal abnormalities: Secondary | ICD-10-CM

## 2022-03-14 MED ORDER — SUTAB 1479-225-188 MG PO TABS: ORAL_TABLET | ORAL | 0 refills | Status: DC

## 2022-03-14 NOTE — Telephone Encounter (Signed)
Spoke with the patient to arrange the colonoscopy date to be done due to positive Cologuard test. ?The last time she prepped it was with the Sutab. She was sent home and the procedure canceled because of inadequate prep. She explained to me that once she got home, she began having response from the prep. We discussed different options for prepping. Patient is an insulin dependent diabetic. She does not feel she can tolerate a 2 day prep due to the clear liquid diet for 2 days. She is asking if there is something she can take in addition to the Sutabs for her prep. She is also concerned about large quantities of fluid. She is afraid she will vomit. Suggestions? ?

## 2022-03-17 ENCOUNTER — Other Ambulatory Visit: Payer: Self-pay

## 2022-03-17 DIAGNOSIS — R195 Other fecal abnormalities: Secondary | ICD-10-CM

## 2022-03-17 NOTE — Telephone Encounter (Signed)
Please check if patient is willing to do 1 capful of MiraLAX 3 times daily for 5 days prior to the procedure and then Sutab x 1 day prior to the procedure.  Avoid any high-fiber diet for 10 days prior to the procedure. ?

## 2022-03-17 NOTE — Telephone Encounter (Signed)
Spoke with the patient. Explained the plan and discussed the goal of daily emptying soft bowel movements. Patient is in agreement with this plan. Instructions modified and mailed to the patient.  ?

## 2022-03-18 ENCOUNTER — Telehealth (INDEPENDENT_AMBULATORY_CARE_PROVIDER_SITE_OTHER): Payer: HMO | Admitting: Family Medicine

## 2022-03-18 ENCOUNTER — Encounter: Payer: Self-pay | Admitting: Family Medicine

## 2022-03-18 DIAGNOSIS — J0191 Acute recurrent sinusitis, unspecified: Secondary | ICD-10-CM

## 2022-03-18 MED ORDER — DOXYCYCLINE HYCLATE 100 MG PO CAPS: 100.0000 mg | ORAL_CAPSULE | Freq: Two times a day (BID) | ORAL | 0 refills | Status: AC

## 2022-03-18 MED ORDER — METHYLPREDNISOLONE 4 MG PO TBPK: ORAL_TABLET | ORAL | 0 refills | Status: DC

## 2022-03-18 NOTE — Progress Notes (Signed)
? ?  Subjective:  ? ? Patient ID: Bonnie Gonzalez, female    DOB: [birth year], 66 y.o.   MRN: [id number] ? ?HPI ?Virtual Visit via Telephone Note ? ?I connected with the patient on 03/18/22 at 10:15 AM EDT by telephone and verified that I am speaking with the correct person using two identifiers. ?  ?I discussed the limitations, risks, security and privacy concerns of performing an evaluation and management service by telephone and the availability of in person appointments. I also discussed with the patient that there may be a patient responsible charge related to this service. The patient expressed understanding and agreed to proceed. ? ?Location patient: home ?Location provider: work or home office ?Participants present for the call: patient, provider ?Patient did not have a visit in the prior 7 days to address this/these issue(s). ? ? ?History of Present Illness: ?Here for one week of stuffy head, PND, and blowing green mucus from the nose. No fever. She has a dry cough.  ?  ?Observations/Objective: ?Patient sounds cheerful and well on the phone. ?I do not appreciate any SOB. ?Speech and thought processing are grossly intact. ?Patient reported vitals: ? ?Assessment and Plan: ?Sinusitis, treat with Doxycycline. Add a Medrol dose pack. ?[name], MD ? ? ?Follow Up Instructions: ? ? ? ? ?66063 5-10 ?[id number] 11-20 ?9443 21-30 ?I did not refer this patient for an OV in the next 24 hours for this/these issue(s). ? ?I discussed the assessment and treatment plan with the patient. The patient was provided an opportunity to ask questions and all were answered. The patient agreed with the plan and demonstrated an understanding of the instructions. ?  ?The patient was advised to call back or seek an in-person evaluation if the symptoms worsen or if the condition fails to improve as anticipated. ? ?I provided 12 minutes of non-face-to-face time during this encounter. ? ? ?[name], MD   ? ? ?Review of Systems ? ?    ?Objective:  ? Physical Exam ? ? ? ? ?   ?Assessment & Plan:  ? ? ?

## 2022-03-21 DIAGNOSIS — J449 Chronic obstructive pulmonary disease, unspecified: Secondary | ICD-10-CM | POA: Diagnosis not present

## 2022-03-24 ENCOUNTER — Telehealth: Payer: Self-pay | Admitting: Gastroenterology

## 2022-03-24 DIAGNOSIS — J432 Centrilobular emphysema: Secondary | ICD-10-CM | POA: Diagnosis not present

## 2022-03-24 DIAGNOSIS — J449 Chronic obstructive pulmonary disease, unspecified: Secondary | ICD-10-CM | POA: Diagnosis not present

## 2022-03-24 DIAGNOSIS — J9611 Chronic respiratory failure with hypoxia: Secondary | ICD-10-CM | POA: Diagnosis not present

## 2022-03-24 NOTE — Telephone Encounter (Signed)
Patient called stating her Sutab needed a prior authorization ASAP or insurance was going to deny it.  Please call [employer] at [phone].  Thank you. ?

## 2022-03-25 NOTE — Telephone Encounter (Signed)
We do not do a prior authorization for Colonoscopy preps, I will check to see if we have any samples.  ? ? ? ?Called patient to inform her that we do have a Sutab kit here if she wants to pick it up. She will be here in a few days to pick it up. ?

## 2022-03-30 ENCOUNTER — Encounter: Payer: Self-pay | Admitting: Family Medicine

## 2022-04-01 ENCOUNTER — Encounter: Payer: Self-pay | Admitting: Family Medicine

## 2022-04-01 ENCOUNTER — Ambulatory Visit (INDEPENDENT_AMBULATORY_CARE_PROVIDER_SITE_OTHER): Payer: HMO | Admitting: Family Medicine

## 2022-04-01 VITALS — BP 136/60 | HR 80 | Temp 98.3°F | Wt 277.0 lb

## 2022-04-01 DIAGNOSIS — H6982 Other specified disorders of Eustachian tube, left ear: Secondary | ICD-10-CM

## 2022-04-01 NOTE — Progress Notes (Signed)
? ?  Subjective:  ? ? Patient ID: Bonnie Gonzalez, female    DOB: 1956-11-16, 66 y.o.   MRN: [id number] ? ?HPI ?Here for 5 days of pressure in the left ear and decreased hearing. No ear pain. We saw her on 03-18-22 for a sinus infection, and she was treated with 10 days of Doxycycline and a Medrol dose pack. She says the infection seems to be completely gone now.  ? ? ?Review of Systems  ?Constitutional: Negative.   ?HENT:  Positive for congestion, hearing loss and postnasal drip. Negative for ear pain, rhinorrhea, sinus pressure and sore throat.   ?Eyes: Negative.   ?Respiratory:  Negative for cough.   ? ?   ?Objective:  ? Physical Exam ?Constitutional:   ?   Appearance: She is not ill-appearing.  ?HENT:  ?   Right Ear: Tympanic membrane, ear canal and external ear normal.  ?   Left Ear: Tympanic membrane, ear canal and external ear normal.  ?   Nose: Nose normal.  ?   Mouth/Throat:  ?   Pharynx: Oropharynx is clear.  ?Eyes:  ?   Conjunctiva/sclera: Conjunctivae normal.  ?Pulmonary:  ?   Effort: Pulmonary effort is normal.  ?   Breath sounds: Normal breath sounds.  ?Neurological:  ?   Mental Status: She is alert.  ? ? ? ? ? ?   ?Assessment & Plan:  ?Her sinusitis has resolved, but now she has eustachian tube dysfunction in the left ear. She will take Zyrtec 10 mg BID, Mucinex 1200 mg BID, and Astelin nasal sprays BID. Recheck as needed. ?[name], MD ? ? ?

## 2022-04-03 ENCOUNTER — Telehealth: Payer: Self-pay | Admitting: Family Medicine

## 2022-04-03 NOTE — Telephone Encounter (Signed)
Medications she started has not changed anything with her head or ears, no improvement at all. Asking if there are other options for treatment. Zyrtec gives her dry mouth ?

## 2022-04-03 NOTE — Telephone Encounter (Signed)
Please advise 

## 2022-04-04 NOTE — Telephone Encounter (Signed)
No, I am not aware of any other medication to try. Moisture helps so she can stand in a hot shower to lossen things up and she can run a humidifier in her bedroom at night  ?

## 2022-04-04 NOTE — Telephone Encounter (Signed)
Pt calling in for clarification on a message she received from provider.  ?

## 2022-04-04 NOTE — Telephone Encounter (Signed)
Noted  

## 2022-04-04 NOTE — Telephone Encounter (Signed)
Called patient, lvm for call back to office.   Patient is active on my chart, sent my chart message with reply from Dr. Sarajane Jews.  ?

## 2022-04-04 NOTE — Telephone Encounter (Signed)
Called and spoke with patient below message given.   Patient stated that her symptoms are a feeling of "fullness" no congestion.    ?Patient stated that it's it feeling as when your ear's change while riding an airplane.  Patient is experiencing these symptoms in bilateral ears and head.    Please advise ?

## 2022-04-08 ENCOUNTER — Ambulatory Visit: Payer: HMO | Admitting: Neurology

## 2022-04-09 ENCOUNTER — Other Ambulatory Visit: Payer: Self-pay

## 2022-04-09 ENCOUNTER — Telehealth: Payer: Self-pay | Admitting: Family Medicine

## 2022-04-09 DIAGNOSIS — J309 Allergic rhinitis, unspecified: Secondary | ICD-10-CM

## 2022-04-09 MED ORDER — HYDROXYZINE PAMOATE 25 MG PO CAPS: 25.0000 mg | ORAL_CAPSULE | ORAL | 2 refills | Status: DC

## 2022-04-09 NOTE — Telephone Encounter (Signed)
Refill sent to [facility]. ? ?

## 2022-04-09 NOTE — Telephone Encounter (Signed)
Patient called because she needs refill on hydrOXYzine (VISTARIL) 25 MG capsule  ? ? ?Patient states that pharmacy sent it to her old primary office instead of Dr.Fry ? ? ? ? ? ?Please send to ?[place] [place] Phone:  [phone]  ?Fax:  [phone]  ?  ? ? ? ? ? ? ?Please advise  ?

## 2022-04-15 ENCOUNTER — Ambulatory Visit (INDEPENDENT_AMBULATORY_CARE_PROVIDER_SITE_OTHER): Payer: HMO

## 2022-04-15 VITALS — Ht 60.0 in | Wt 277.0 lb

## 2022-04-15 DIAGNOSIS — Z0000 Encounter for general adult medical examination without abnormal findings: Secondary | ICD-10-CM | POA: Diagnosis not present

## 2022-04-15 NOTE — Progress Notes (Signed)
? ?Subjective:  ? Bonnie Gonzalez is a 66 y.o. female who presents for Medicare Annual (Subsequent) preventive examination. ? ?Review of Systems    ?Virtual Visit via Telephone Note ? ?I connected with  Bonnie Gonzalez on 04/15/22 at 10:30 AM EDT by telephone and verified that I am speaking with the correct person using two identifiers. ? ?Location: ?Patient: Home ?Provider: Office ?Persons participating in the virtual visit: patient/Nurse Health Advisor ?  ?I discussed the limitations, risks, security and privacy concerns of performing an evaluation and management service by telephone and the availability of in person appointments. The patient expressed understanding and agreed to proceed. ? ?Interactive audio and video telecommunications were attempted between this nurse and patient, however failed, due to patient having technical difficulties OR patient did not have access to video capability.  We continued and completed visit with audio only. ? ?Some vital signs may be absent or patient reported.  ? ?[name], LPN  ?Cardiac Risk Factors include: advanced age (>56mn, >>46women);diabetes mellitus;hypertension;Other (see comment), Risk factor comments: COPD ? ?   ?Objective:  ?  ?Today's Vitals  ? 04/15/22 1034  ?Weight: 277 lb (125.6 kg)  ?Height: 5' (1.524 m)  ? ?Body mass index is 54.1 kg/m?. ? ? ?  04/15/2022  ? 11:05 AM 11/19/2021  ?  9:33 AM 04/11/2021  ?  8:54 AM 01/10/2019  ?  2:04 PM 08/31/2014  ?  3:24 PM  ?Advanced Directives  ?Does Patient Have a Medical Advance Directive? _0   ?Would patient like information on creating a medical advance directive? Yes (ED - Information included in AVS)   Yes (ED - Information included in AVS) No - patient declined information  ? ? ?Current Medications (verified) ?Outpatient Encounter Medications as of 04/15/2022  ?Medication Sig  ? albuterol (PROVENTIL HFA;VENTOLIN HFA) 108 (90 Base) MCG/ACT inhaler Inhale 2 puffs into the lungs every 4 (four) hours  as needed for wheezing or shortness of breath.  ? ALPRAZolam (XANAX) 0.5 MG tablet TAKE 1 TABLET (0.5 MG TOTAL) BY MOUTH 2 (TWO) TIMES DAILY AS NEEDED. (Patient taking differently: Take 0.25 mg by mouth See admin instructions. Take 0.25 mg at night, may take an additional 0.25 to 0.5 mg dose during the day as needed for anxiety)  ? atorvastatin (LIPITOR) 80 MG tablet Take 1 tablet (80 mg total) by mouth daily.  ? azelastine (ASTELIN) 0.1 % nasal spray Place 1 spray into the nose 2 (two) times daily as needed for allergies.  ? benzonatate (TESSALON) 200 MG capsule Take 1 capsule (200 mg total) by mouth every 8 (eight) hours as needed for cough.  ? Cholecalciferol 50 MCG (2000 UT) CAPS Take 1 capsule by mouth daily.  ? Continuous Blood Gluc Sensor (FREESTYLE LIBRE 14 DAY SENSOR) MISC by Does not apply route.  ? DULoxetine (CYMBALTA) 60 MG capsule Take 1 capsule (60 mg total) by mouth daily. (Patient taking differently: Take 120 mg by mouth at bedtime.)  ? esomeprazole (NEXIUM) 20 MG capsule Take 20 mg by mouth daily.  ? Fluticasone-Umeclidin-Vilant (TRELEGY ELLIPTA) 100-62.5-25 MCG/INH AEPB Inhale 1 puff into the lungs daily.  ? furosemide (LASIX) 40 MG tablet Take 1 tablet (40 mg total) by mouth daily. (Patient taking differently: Take 20 mg by mouth daily.)  ? glucose 4 GM chewable tablet Chew 1 tablet (4 g total) by mouth as needed for low blood sugar (< 70).  ? hydrOXYzine (VISTARIL) 25 MG capsule Take 1 capsule (25  mg total) by mouth See admin instructions. Take 1 capsule by mouth three times daily as needed.  ? Hyprom-Naphaz-Polysorb-Zn Sulf (CLEAR EYES COMPLETE) SOLN Place 1-2 drops into both eyes as needed (for itching).  ? insulin aspart (NOVOLOG) 100 UNIT/ML injection Use sliding scale provided at time of discharge, administer 3 times daily with meals (Patient taking differently: Inject 15-48 Units into the skin See admin instructions. Injection 40-48 units 3 times daily with meals and 15-35 units at night)   ? insulin glargine, 2 Unit Dial, (TOUJEO MAX SOLOSTAR) 300 UNIT/ML Solostar Pen Inject 75 Units into the skin 2 (two) times daily.  ? Insulin Pen Needle (B-D UF III MINI PEN NEEDLES) 31G X 5 MM MISC Use to administer insulin five times a day  ? levothyroxine (SYNTHROID) 175 MCG tablet TAKE 1 TABLET BY MOUTH ONCE DAILY BEFORE BREAKFAST  ? meclizine (ANTIVERT) 50 MG tablet Take 0.5 tablets (25 mg total) by mouth 3 (three) times daily as needed. (Patient taking differently: Take 25 mg by mouth 3 (three) times daily as needed for dizziness.)  ? methocarbamol (ROBAXIN) 500 MG tablet Take 1 tablet (500 mg total) by mouth every 6 (six) hours as needed for muscle spasms.  ? methylPREDNISolone (MEDROL DOSEPAK) 4 MG TBPK tablet As directed  ? naproxen (NAPROSYN) 500 MG tablet Take 1 tablet (500 mg total) by mouth 2 (two) times daily.  ? nystatin (MYCOSTATIN/NYSTOP) powder Apply 1 application topically 2 (two) times daily as needed (rash).  ? OXYGEN Inhale into the lungs. 2 liters 24/7  ? primidone (MYSOLINE) 50 MG tablet Take 1 tablet (50 mg total) by mouth at bedtime. (Patient taking differently: Take 25-50 mg by mouth See admin instructions. Take 25 mg nightly for 7 days then increase to 50 mg nightly)  ? Probiotic Product (PROBIOTIC-10 PO) Take by mouth.  ? Semaglutide,0.25 or 0.5MG/DOS, 2 MG/1.5ML SOPN Inject 0.5 mg into the skin every Sunday.  ? Sodium Sulfate-Mag Sulfate-KCl (SUTAB) 2491933321 MG TABS As directed by GI offfice for colon prep  ? temazepam (RESTORIL) 15 MG capsule Take 15 mg by mouth at bedtime.  ? ?No facility-administered encounter medications on file as of 04/15/2022.  ? ? ?Allergies (verified) ?Aspirin, Hydrocodone-acetaminophen, Oxycodone-acetaminophen, Penicillins, Codeine, Fluoxetine hcl, and Trazodone  ? ?History: ?Past Medical History:  ?Diagnosis Date  ? Acquired hypothyroidism 10/01/2010  ? Allergic rhinitis 07/20/2009  ? Arthritis   ? Back pain 10/18/2015  ? Bilateral leg edema 03/31/2018  ?  Centrilobular emphysema 02/04/2019  ? HRCT 09/15/18  ? Chronic diastolic CHF (congestive heart failure) 01/28/2019  ? Chronic respiratory failure with hypoxia 02/04/2019  ? 6 Min Walk 08/31/18 at [facility]  ? Chronic urticaria 12/21/2019  ? COPD (chronic obstructive pulmonary disease) 09/01/2018  ? Quit smoking 2010  - Spirometry 08/31/2018  FEV1 1.1 (51%)  Ratio 66 s prior rx with typical curvature  - 08/31/2018  After extensive coaching inhaler device,  effectiveness =    90% with elipta > try anoro sample > no better so d/c'  - PFT's  11/10/2018  FEV1 1.24 (59 % ) ratio 75 (ratio with fev1/VC =  63)  p no % improvement from saba p nothing prior to study with DLCO  105  % corrects to 132  %   ? DOE (dyspnea on exertion) 08/31/2018  ? Onset 05/2018 with background of unexplained leg swelling x 01/2018   -echo 04/13/18 just G1   diastolic dysfunction s PH   -  08/31/2018   Walked  RA x one lap @ 185 stopped due to  Sob/ sats 89% at nl pace  - Spirometry 08/31/2018  FEV1 1.1 (51%)  Ratio 66 s prior rx  - 09/28/2018 could not do full pfts 09/28/2018  - Collagen vasc profile 09/28/18  For ESR =  54 (was 102 before prednisone)  Neg collag  ? Dyshidrotic eczema 12/13/2010  ? Essential hypertension 01/26/2019  ? Generalized anxiety disorder with panic attacks 01/20/2018  ? GERD (gastroesophageal reflux disease) 11/03/2007  ? Headache 11/03/2007  ? Irritable bowel syndrome 07/13/2009  ? Major depressive disorder 07/17/2020  ? Mixed hyperlipidemia 07/20/2009  ? Obesity   ? OSA (obstructive sleep apnea) 07/26/2015  ? HST 08/2015 mild OSA, AHI 8/hour, lowest desaturation 81%, desaturations noted without respiratory events  Formatting of this note might be different from the original. PSG 05/20/19 AHI 25.1  ? Osteopenia of multiple sites 06/01/2019  ? DEXA 05/24/2019: Right femur neck T- 2.2, left femur neck T- 1.5, right total femur T -0.5, left total femur T -0.5, AP total spine T- 1.9. FRAX 10-year probability of major  osteoporotic fracture is 8.8% and a hip fracture is 1.2%  DEXA 05/05/2019: Right femur neck T- 1.9, left femur neck T- 2.0, right total femur T -1.3, left total femur  ? Oxygen deficiency   ? Postinflammatory

## 2022-04-15 NOTE — Patient Instructions (Addendum)
?[name] , ?Thank you for taking time to come for your Medicare Wellness Visit. I appreciate your ongoing commitment to your health goals. Please review the following plan we discussed and let me know if I can assist you in the future.  ? ?These are the goals we discussed: ? Goals   ? ?   No current goals (pt-stated)   ? ?  ?  ?This is a list of the screening recommended for you and due dates:  ?Health Maintenance  ?Topic Date Due  ? Complete foot exam   03/18/2019  ? Eye exam for diabetics  04/25/2019  ? Urine Protein Check  05/16/2022*  ? Colon Cancer Screening  06/14/2022*  ? COVID-19 Vaccine (4 - Booster for Moderna series) 03/31/2023*  ? Hepatitis C Screening: USPSTF Recommendation to screen - Ages 18-79 yo.  04/16/2023*  ? Flu Shot  07/01/2022  ? Hemoglobin A1C  09/03/2022  ? Pap Smear  01/30/2023  ? Mammogram  05/17/2023  ? Tetanus Vaccine  12/24/2030  ? Pneumonia Vaccine  Completed  ? DEXA scan (bone density measurement)  Completed  ? HIV Screening  Completed  ? Zoster (Shingles) Vaccine  Completed  ? HPV Vaccine  Aged Out  ?*Topic was postponed. The date shown is not the original due date.  ? ? ?Advanced directives: No  ? ?Conditions/risks identified: None ? ?Next appointment: Follow up in one year for your annual wellness visit  ? ? ? ?Preventive Care 19 Years and Older, Female ?Preventive care refers to lifestyle choices and visits with your health care provider that can promote health and wellness. ?What does preventive care include? ?A yearly physical exam. This is also called an annual well check. ?Dental exams once or twice a year. ?Routine eye exams. Ask your health care provider how often you should have your eyes checked. ?Personal lifestyle choices, including: ?Daily care of your teeth and gums. ?Regular physical activity. ?Eating a healthy diet. ?Avoiding tobacco and drug use. ?Limiting alcohol use. ?Practicing safe sex. ?Taking low-dose aspirin every day. ?Taking vitamin and mineral  supplements as recommended by your health care provider. ?What happens during an annual well check? ?The services and screenings done by your health care provider during your annual well check will depend on your age, overall health, lifestyle risk factors, and family history of disease. ?Counseling  ?Your health care provider may ask you questions about your: ?Alcohol use. ?Tobacco use. ?Drug use. ?Emotional well-being. ?Home and relationship well-being. ?Sexual activity. ?Eating habits. ?History of falls. ?Memory and ability to understand (cognition). ?Work and work Statistician. ?Reproductive health. ?Screening  ?You may have the following tests or measurements: ?Height, weight, and BMI. ?Blood pressure. ?Lipid and cholesterol levels. These may be checked every 5 years, or more frequently if you are over 20 years old. ?Skin check. ?Lung cancer screening. You may have this screening every year starting at age 25 if you have a 30-pack-year history of smoking and currently smoke or have quit within the past 15 years. ?Fecal occult blood test (FOBT) of the stool. You may have this test every year starting at age 1. ?Flexible sigmoidoscopy or colonoscopy. You may have a sigmoidoscopy every 5 years or a colonoscopy every 10 years starting at age 70. ?Hepatitis C blood test. ?Hepatitis B blood test. ?Sexually transmitted disease (STD) testing. ?Diabetes screening. This is done by checking your blood sugar (glucose) after you have not eaten for a while (fasting). You may have this done every 1-3 years. ?Bone density  scan. This is done to screen for osteoporosis. You may have this done starting at age 73. ?Mammogram. This may be done every 1-2 years. Talk to your health care provider about how often you should have regular mammograms. ?Talk with your health care provider about your test results, treatment options, and if necessary, the need for more tests. ?Vaccines  ?Your health care provider may recommend certain  vaccines, such as: ?Influenza vaccine. This is recommended every year. ?Tetanus, diphtheria, and acellular pertussis (Tdap, Td) vaccine. You may need a Td booster every 10 years. ?Zoster vaccine. You may need this after age 74. ?Pneumococcal 13-valent conjugate (PCV13) vaccine. One dose is recommended after age 3. ?Pneumococcal polysaccharide (PPSV23) vaccine. One dose is recommended after age 23. ?Talk to your health care provider about which screenings and vaccines you need and how often you need them. ?This information is not intended to replace advice given to you by your health care provider. Make sure you discuss any questions you have with your health care provider. ?Document Released: 12/14/2015 Document Revised: 08/06/2016 Document Reviewed: 09/18/2015 ?Elsevier Interactive Patient Education ? 2017 [place]. ? ?Fall Prevention in the Home ?Falls can cause injuries. They can happen to people of all ages. There are many things you can do to make your home safe and to help prevent falls. ?What can I do on the outside of my home? ?Regularly fix the edges of walkways and driveways and fix any cracks. ?Remove anything that might make you trip as you walk through a door, such as a raised step or threshold. ?Trim any bushes or trees on the path to your home. ?Use bright outdoor lighting. ?Clear any walking paths of anything that might make someone trip, such as rocks or tools. ?Regularly check to see if handrails are loose or broken. Make sure that both sides of any steps have handrails. ?Any raised decks and porches should have guardrails on the edges. ?Have any leaves, snow, or ice cleared regularly. ?Use sand or salt on walking paths during winter. ?Clean up any spills in your garage right away. This includes oil or grease spills. ?What can I do in the bathroom? ?Use night lights. ?Install grab bars by the toilet and in the tub and shower. Do not use towel bars as grab bars. ?Use non-skid mats or decals in  the tub or shower. ?If you need to sit down in the shower, use a plastic, non-slip stool. ?Keep the floor dry. Clean up any water that spills on the floor as soon as it happens. ?Remove soap buildup in the tub or shower regularly. ?Attach bath mats securely with double-sided non-slip rug tape. ?Do not have throw rugs and other things on the floor that can make you trip. ?What can I do in the bedroom? ?Use night lights. ?Make sure that you have a light by your bed that is easy to reach. ?Do not use any sheets or blankets that are too big for your bed. They should not hang down onto the floor. ?Have a firm chair that has side arms. You can use this for support while you get dressed. ?Do not have throw rugs and other things on the floor that can make you trip. ?What can I do in the kitchen? ?Clean up any spills right away. ?Avoid walking on wet floors. ?Keep items that you use a lot in easy-to-reach places. ?If you need to reach something above you, use a strong step stool that has a grab bar. ?Keep electrical  cords out of the way. ?Do not use floor polish or wax that makes floors slippery. If you must use wax, use non-skid floor wax. ?Do not have throw rugs and other things on the floor that can make you trip. ?What can I do with my stairs? ?Do not leave any items on the stairs. ?Make sure that there are handrails on both sides of the stairs and use them. Fix handrails that are broken or loose. Make sure that handrails are as long as the stairways. ?Check any carpeting to make sure that it is firmly attached to the stairs. Fix any carpet that is loose or worn. ?Avoid having throw rugs at the top or bottom of the stairs. If you do have throw rugs, attach them to the floor with carpet tape. ?Make sure that you have a light switch at the top of the stairs and the bottom of the stairs. If you do not have them, ask someone to add them for you. ?What else can I do to help prevent falls? ?Wear shoes that: ?Do not have high  heels. ?Have rubber bottoms. ?Are comfortable and fit you well. ?Are closed at the toe. Do not wear sandals. ?If you use a stepladder: ?Make sure that it is fully opened. Do not climb a closed stepladder. ?Make sure

## 2022-04-20 DIAGNOSIS — J449 Chronic obstructive pulmonary disease, unspecified: Secondary | ICD-10-CM | POA: Diagnosis not present

## 2022-04-22 DIAGNOSIS — R042 Hemoptysis: Secondary | ICD-10-CM | POA: Diagnosis not present

## 2022-04-22 DIAGNOSIS — R079 Chest pain, unspecified: Secondary | ICD-10-CM | POA: Diagnosis not present

## 2022-04-23 DIAGNOSIS — J449 Chronic obstructive pulmonary disease, unspecified: Secondary | ICD-10-CM | POA: Diagnosis not present

## 2022-04-23 DIAGNOSIS — J9611 Chronic respiratory failure with hypoxia: Secondary | ICD-10-CM | POA: Diagnosis not present

## 2022-04-23 DIAGNOSIS — J432 Centrilobular emphysema: Secondary | ICD-10-CM | POA: Diagnosis not present

## 2022-05-05 ENCOUNTER — Encounter (HOSPITAL_COMMUNITY): Payer: Self-pay | Admitting: Gastroenterology

## 2022-05-12 ENCOUNTER — Other Ambulatory Visit: Payer: Self-pay | Admitting: Neurology

## 2022-05-12 NOTE — Telephone Encounter (Signed)
Patient scheduled with Dr. Carles Collet on 06/17/22 at 11:15 AM.  She said she is taking her primidone by one tablet at night.

## 2022-05-12 NOTE — Telephone Encounter (Signed)
Called patient to request she make an appointment to continue with her medication refills. I also want to verify how she is taking medication because the pharmacy is reporting she is taking it differently than what Dr. Carles Collet has prescribed

## 2022-05-14 ENCOUNTER — Telehealth: Payer: Self-pay | Admitting: Family Medicine

## 2022-05-14 NOTE — Telephone Encounter (Signed)
Pt requesting an order for a walker with an attached seat.

## 2022-05-15 ENCOUNTER — Ambulatory Visit (HOSPITAL_COMMUNITY)
Admission: RE | Admit: 2022-05-15 | Discharge: 2022-05-15 | Disposition: A | Discharge status: Home / Self Care | Payer: HMO | Attending: Gastroenterology | Admitting: Gastroenterology

## 2022-05-15 ENCOUNTER — Ambulatory Visit (HOSPITAL_COMMUNITY): Payer: HMO | Admitting: Anesthesiology

## 2022-05-15 ENCOUNTER — Encounter (HOSPITAL_COMMUNITY): Payer: Self-pay | Admitting: Gastroenterology

## 2022-05-15 ENCOUNTER — Ambulatory Visit (HOSPITAL_BASED_OUTPATIENT_CLINIC_OR_DEPARTMENT_OTHER): Payer: HMO | Admitting: Anesthesiology

## 2022-05-15 ENCOUNTER — Other Ambulatory Visit: Payer: Self-pay

## 2022-05-15 ENCOUNTER — Encounter (HOSPITAL_COMMUNITY)
Admission: RE | Disposition: A | Discharge status: Home / Self Care | Payer: Self-pay | Source: Home / Self Care | Attending: Gastroenterology

## 2022-05-15 DIAGNOSIS — Z87891 Personal history of nicotine dependence: Secondary | ICD-10-CM | POA: Diagnosis not present

## 2022-05-15 DIAGNOSIS — I5032 Chronic diastolic (congestive) heart failure: Secondary | ICD-10-CM | POA: Insufficient documentation

## 2022-05-15 DIAGNOSIS — E119 Type 2 diabetes mellitus without complications: Secondary | ICD-10-CM | POA: Diagnosis not present

## 2022-05-15 DIAGNOSIS — J432 Centrilobular emphysema: Secondary | ICD-10-CM | POA: Insufficient documentation

## 2022-05-15 DIAGNOSIS — K5520 Angiodysplasia of colon without hemorrhage: Secondary | ICD-10-CM | POA: Insufficient documentation

## 2022-05-15 DIAGNOSIS — D123 Benign neoplasm of transverse colon: Secondary | ICD-10-CM | POA: Diagnosis not present

## 2022-05-15 DIAGNOSIS — K648 Other hemorrhoids: Secondary | ICD-10-CM | POA: Diagnosis not present

## 2022-05-15 DIAGNOSIS — K635 Polyp of colon: Secondary | ICD-10-CM

## 2022-05-15 DIAGNOSIS — Z1211 Encounter for screening for malignant neoplasm of colon: Secondary | ICD-10-CM | POA: Diagnosis not present

## 2022-05-15 DIAGNOSIS — K5730 Diverticulosis of large intestine without perforation or abscess without bleeding: Secondary | ICD-10-CM | POA: Diagnosis not present

## 2022-05-15 DIAGNOSIS — R195 Other fecal abnormalities: Secondary | ICD-10-CM

## 2022-05-15 DIAGNOSIS — I110 Hypertensive heart disease with heart failure: Secondary | ICD-10-CM | POA: Insufficient documentation

## 2022-05-15 DIAGNOSIS — I509 Heart failure, unspecified: Secondary | ICD-10-CM | POA: Diagnosis not present

## 2022-05-15 DIAGNOSIS — K5521 Angiodysplasia of colon with hemorrhage: Secondary | ICD-10-CM

## 2022-05-15 DIAGNOSIS — G4730 Sleep apnea, unspecified: Secondary | ICD-10-CM | POA: Insufficient documentation

## 2022-05-15 DIAGNOSIS — Z794 Long term (current) use of insulin: Secondary | ICD-10-CM | POA: Insufficient documentation

## 2022-05-15 DIAGNOSIS — D120 Benign neoplasm of cecum: Secondary | ICD-10-CM | POA: Insufficient documentation

## 2022-05-15 HISTORY — PX: HOT HEMOSTASIS: SHX5433

## 2022-05-15 HISTORY — PX: COLONOSCOPY WITH PROPOFOL: SHX5780

## 2022-05-15 HISTORY — PX: POLYPECTOMY: SHX5525

## 2022-05-15 LAB — GLUCOSE, CAPILLARY
Glucose-Capillary: 115 mg/dL — ABNORMAL HIGH (ref 70–99)
Glucose-Capillary: 98 mg/dL (ref 70–99)

## 2022-05-15 SURGERY — COLONOSCOPY WITH PROPOFOL
Anesthesia: Monitor Anesthesia Care

## 2022-05-15 MED ORDER — PROPOFOL 500 MG/50ML IV EMUL
INTRAVENOUS | Status: DC | PRN
  Administered 2022-05-15: 150 ug/kg/min via INTRAVENOUS

## 2022-05-15 MED ORDER — LACTATED RINGERS IV SOLN: INTRAVENOUS | Status: DC

## 2022-05-15 SURGICAL SUPPLY — 22 items

## 2022-05-15 NOTE — Op Note (Signed)
[facility] Patient Name: Bonnie Gonzalez Procedure Date: 05/15/2022 MRN: [id number] Attending MD: Mauri Pole , MD Date of Birth: [birth year] CSN: [id number] Age: 66 Admit Type: Outpatient Procedure:                Colonoscopy Indications:              Positive Cologuard test Providers:                Mauri Pole, MD, Carlyn Reichert, RN, Gloris Ham, Technician Referring MD:              Medicines:                 Complications:            No immediate complications. Estimated Blood Loss:     Estimated blood loss was minimal. Procedure:                Pre-Anesthesia Assessment:                           - Prior to the procedure, a History and Physical                            was performed, and patient medications and                            allergies were reviewed. The patient's tolerance of                            previous anesthesia was also reviewed. The risks                            and benefits of the procedure and the sedation                            options and risks were discussed with the patient.                            All questions were answered, and informed consent                            was obtained. Prior Anticoagulants: The patient has                            taken no previous anticoagulant or antiplatelet                            agents. ASA Grade Assessment: II - A patient with                            mild systemic disease. After reviewing the risks                            and  benefits, the patient was deemed in                            satisfactory condition to undergo the procedure.                           After obtaining informed consent, the colonoscope                            was passed under direct vision. Throughout the                            procedure, the patient's blood pressure, pulse, and                            oxygen saturations were monitored  continuously. The                            PCF-HQ190L ([id number]) Olympus colonoscope was                            introduced through the anus and advanced to the the                            cecum, identified by appendiceal orifice and                            ileocecal valve. The colonoscopy was performed                            without difficulty. The patient tolerated the                            procedure well. The quality of the bowel                            preparation was good. The ileocecal valve,                            appendiceal orifice, and rectum were photographed. Scope In: 10:37:27 AM Scope Out: 92:44:62 AM Scope Withdrawal Time: 0 hours 12 minutes 50 seconds  Total Procedure Duration: 0 hours 29 minutes 14 seconds  Findings:      The perianal and digital rectal examinations were normal.      Two small angioectasias with bleeding on contact were found in the       cecum. Coagulation for hemostasis using argon plasma was successful.      Three sessile polyps were found in the transverse colon and cecum. The       polyps were 4 to 7 mm in size. These polyps were removed with a cold       snare. Resection and retrieval were complete.      Two semi-pedunculated polyps were found in the sigmoid colon. The polyps       were 8 to 9 mm in size. These polyps were removed with a hot snare.  Resection and retrieval were complete.      A few small-mouthed diverticula were found in the sigmoid colon.      Non-bleeding external and internal hemorrhoids were found during       retroflexion. The hemorrhoids were small. Impression:               - Two colonic angioectasias. Treated with argon                            plasma coagulation (APC).                           - Three 4 to 7 mm polyps in the transverse colon                            and in the cecum, removed with a cold snare.                            Resected and retrieved.                            - Two 8 to 9 mm polyps in the sigmoid colon,                            removed with a hot snare. Resected and retrieved.                           - Diverticulosis in the sigmoid colon.                           - Non-bleeding external and internal hemorrhoids. Moderate Sedation:      Not Applicable - Patient had care per Anesthesia. Recommendation:           - Patient has a contact number available for                            emergencies. The signs and symptoms of potential                            delayed complications were discussed with the                            patient. Return to normal activities tomorrow.                            Written discharge instructions were provided to the                            patient.                           - Resume previous diet.                           - Continue present medications.                           -  Await pathology results.                           - Repeat colonoscopy in 3 - 5 years for                            surveillance based on pathology results. Procedure Code(s):        --- Professional ---                           [phone], 59, Colonoscopy, flexible; with control of                            bleeding, any method                           [id number], Colonoscopy, flexible; with removal of                            tumor(s), polyp(s), or other lesion(s) by snare                            technique Diagnosis Code(s):        --- Professional ---                           K55.20, Angiodysplasia of colon without hemorrhage                           K63.5, Polyp of colon                           K64.8, Other hemorrhoids                           R19.5, Other fecal abnormalities                           K57.30, Diverticulosis of large intestine without                            perforation or abscess without bleeding CPT copyright 2019 American Medical Association. All rights reserved. The codes documented in  this report are preliminary and upon coder review may  be revised to meet current compliance requirements. Mauri Pole, MD 05/15/2022 11:20:41 AM This report has been signed electronically. Number of Addenda: 0

## 2022-05-15 NOTE — Transfer of Care (Signed)
Immediate Anesthesia Transfer of Care Note  Patient: Bonnie Gonzalez  Procedure(s) Performed: COLONOSCOPY WITH PROPOFOL POLYPECTOMY HOT HEMOSTASIS (ARGON PLASMA COAGULATION/BICAP)  Patient Location: PACU  Anesthesia Type:MAC  Level of Consciousness: sedated, patient cooperative and responds to stimulation  Airway & Oxygen Therapy: Patient Spontanous Breathing and Patient connected to face mask oxygen  Post-op Assessment: Report given to RN and Post -op Vital signs reviewed and stable  Post vital signs: Reviewed and stable  Last Vitals:  Vitals Value Taken Time  BP 150/62 05/15/22 1113  Temp    Pulse 96 05/15/22 1113  Resp 17 05/15/22 1113  SpO2 100 % 05/15/22 1113  Vitals shown include unvalidated device data.  Last Pain:  Vitals:   05/15/22 1113  TempSrc: Temporal  PainSc:          Complications: No notable events documented.

## 2022-05-15 NOTE — Telephone Encounter (Signed)
Left detailed message for pt to pick up Rx for walker at the office, Rx is ready and is on [employer]

## 2022-05-15 NOTE — Discharge Instructions (Signed)
YOU HAD AN ENDOSCOPIC PROCEDURE TODAY AT THE [facility]:   Refer to the procedure report that was given to you for any specific questions about what was found during the examination.  If the procedure report does not answer your questions, please call your gastroenterologist to clarify.  If you requested that your care partner not be given the details of your procedure findings, then the procedure report has been included in a sealed envelope for you to review at your convenience later.  YOU SHOULD EXPECT: Some feelings of bloating in the abdomen. Passage of more gas than usual.  Walking can help get rid of the air that was put into your GI tract during the procedure and reduce the bloating. If you had a lower endoscopy (such as a colonoscopy or flexible sigmoidoscopy) you may notice spotting of blood in your stool or on the toilet paper. If you underwent a bowel prep for your procedure, you may not have a normal bowel movement for a few days.  Please Note:  You might notice some irritation and congestion in your nose or some drainage.  This is from the oxygen used during your procedure.  There is no need for concern and it should clear up in a day or so.  SYMPTOMS TO REPORT IMMEDIATELY:  Following lower endoscopy (colonoscopy or flexible sigmoidoscopy):  Excessive amounts of blood in the stool  Significant tenderness or worsening of abdominal pains  Swelling of the abdomen that is new, acute  Fever of 100F or higher  For urgent or emergent issues, a gastroenterologist can be reached at any hour by calling [phone]. Do not use MyChart messaging for urgent concerns.    DIET:  We do recommend a small meal at first, but then you may proceed to your regular diet.  Drink plenty of fluids but you should avoid alcoholic beverages for 24 hours.  ACTIVITY:  You should plan to take it easy for the rest of today and you should NOT DRIVE or use heavy machinery until tomorrow (because of  the sedation medicines used during the test).    FOLLOW UP: Our staff will call the number listed on your records 24-72 hours following your procedure to check on you and address any questions or concerns that you may have regarding the information given to you following your procedure. If we do not reach you, we will leave a message.  We will attempt to reach you two times.  During this call, we will ask if you have developed any symptoms of COVID 19. If you develop any symptoms (ie: fever, flu-like symptoms, shortness of breath, cough etc.) before then, please call [phone].  If you test positive for Covid 19 in the 2 weeks post procedure, please call and report this information to us.    If any biopsies were taken you will be contacted by phone or by letter within the next 1-3 weeks.  Please call us at [phone] if you have not heard about the biopsies in 3 weeks.    SIGNATURES/CONFIDENTIALITY: You and/or your care partner have signed paperwork which will be entered into your electronic medical record.  These signatures attest to the fact that that the information above on your After Visit Summary has been reviewed and is understood.  Full responsibility of the confidentiality of this discharge information lies with you and/or your care-partner.  

## 2022-05-15 NOTE — H&P (Signed)
[facility] History and Physical   Primary Care Physician:  Laurey Morale, MD   Reason for Procedure:   Positive Cologaurd  Plan:    Colonoscopy with intervention as needed     HPI: Bonnie Gonzalez is a 66 y.o. female here for colonoscopy for further evaluation of positive Cologaurd.  The risks and benefits as well as alternatives of endoscopic procedure(s) have been discussed and reviewed. All questions answered. The patient agrees to proceed.    Past Medical History:  Diagnosis Date   Acquired hypothyroidism 10/01/2010   Allergic rhinitis 07/20/2009   Arthritis    Back pain 10/18/2015   Bilateral leg edema 03/31/2018   Centrilobular emphysema 02/04/2019   HRCT 09/15/18   Chronic diastolic CHF (congestive heart failure) 01/28/2019   Chronic respiratory failure with hypoxia 02/04/2019   6 Min Walk 08/31/18 at [facility]   Chronic urticaria 12/21/2019   COPD (chronic obstructive pulmonary disease) 09/01/2018   Quit smoking 2010  - Spirometry 08/31/2018  FEV1 1.1 (51%)  Ratio 66 s prior rx with typical curvature  - 08/31/2018  After extensive coaching inhaler device,  effectiveness =    90% with elipta > try anoro sample > no better so d/c'  - PFT's  11/10/2018  FEV1 1.24 (59 % ) ratio 75 (ratio with fev1/VC =  63)  p no % improvement from saba p nothing prior to study with DLCO  105  % corrects to 132  %    DOE (dyspnea on exertion) 08/31/2018   Onset 05/2018 with background of unexplained leg swelling x 01/2018   -echo 04/13/18 just G1   diastolic dysfunction s PH   -  08/31/2018   Walked RA x one lap @ 185 stopped due to  Sob/ sats 89% at nl pace  - Spirometry 08/31/2018  FEV1 1.1 (51%)  Ratio 66 s prior rx  - 09/28/2018 could not do full pfts 09/28/2018  - Collagen vasc profile 09/28/18  For ESR =  54 (was 102 before prednisone)  Neg collag   Dyshidrotic eczema 12/13/2010   Essential hypertension 01/26/2019   Generalized anxiety disorder with panic attacks  01/20/2018   GERD (gastroesophageal reflux disease) 11/03/2007   Headache 11/03/2007   Irritable bowel syndrome 07/13/2009   Major depressive disorder 07/17/2020   Mixed hyperlipidemia 07/20/2009   Obesity    OSA (obstructive sleep apnea) 07/26/2015   HST 08/2015 mild OSA, AHI 8/hour, lowest desaturation 81%, desaturations noted without respiratory events  Formatting of this note might be different from the original. PSG 05/20/19 AHI 25.1   Osteopenia of multiple sites 06/01/2019   DEXA 05/24/2019: Right femur neck T- 2.2, left femur neck T- 1.5, right total femur T -0.5, left total femur T -0.5, AP total spine T- 1.9. FRAX 10-year probability of major osteoporotic fracture is 8.8% and a hip fracture is 1.2%  DEXA 05/05/2019: Right femur neck T- 1.9, left femur neck T- 2.0, right total femur T -1.3, left total femur   Oxygen deficiency    Postinflammatory pulmonary fibrosis 09/15/2018   ESR    10/161/9  = 102   > rx short term prednisone ? slt improvement in symptoms HRCT 09/15/2018  1. Very mild basilar subpleural ground-glass, reticulation and traction bronchiolectasis, findings which may be minimally progressive from 06/29/2017 and are indicative of interstitial lung disease such as nonspecific interstitial pneumonitis. Findings are indeterminate for UIP per consensus guidelin   Psychophysiological insomnia 03/29/2019   Type 2 diabetes mellitus 02/09/2017  UTI (urinary tract infection)    Vertigo 03/29/2014   Vitamin D deficiency 09/15/2020    Past Surgical History:  Procedure Laterality Date   ANKLE FRACTURE SURGERY Right    benign tumor      removed from groin   WRIST ARTHROCENTESIS N/A 2001    Prior to Admission medications   Medication Sig Start Date End Date Taking? Authorizing Provider  albuterol (PROVENTIL HFA;VENTOLIN HFA) 108 (90 Base) MCG/ACT inhaler Inhale 2 puffs into the lungs every 4 (four) hours as needed for wheezing or shortness of breath. 06/17/18  Yes Laurey Morale,  MD  ALPRAZolam Duanne Moron) 0.5 MG tablet TAKE 1 TABLET (0.5 MG TOTAL) BY MOUTH 2 (TWO) TIMES DAILY AS NEEDED. Patient taking differently: Take 0.25-0.5 mg by mouth 2 (two) times daily as needed for anxiety or sleep. 07/21/18  Yes Laurey Morale, MD  atorvastatin (LIPITOR) 80 MG tablet Take 1 tablet (80 mg total) by mouth daily. 10/03/21  Yes Turner, Eber Hong, MD  azelastine (ASTELIN) 0.1 % nasal spray Place 1 spray into the nose 2 (two) times daily as needed for allergies. 12/20/20  Yes [provider]  benzonatate (TESSALON) 200 MG capsule Take 1 capsule (200 mg total) by mouth every 8 (eight) hours as needed for cough. 10/31/21  Yes Laurey Morale, MD  Cholecalciferol 125 MCG (5000 UT) TABS Take 5,000 Units by mouth in the morning. 12/23/19  Yes [provider]  DULoxetine (CYMBALTA) 60 MG capsule Take 1 capsule (60 mg total) by mouth daily. Patient taking differently: Take 120 mg by mouth at bedtime. 01/19/18  Yes Laurey Morale, MD  esomeprazole (NEXIUM) 20 MG capsule Take 20 mg by mouth daily before breakfast.   Yes [provider]  Fluticasone-Umeclidin-Vilant (TRELEGY ELLIPTA) 100-62.5-25 MCG/INH AEPB Inhale 1 puff into the lungs daily.   Yes [provider]  furosemide (LASIX) 40 MG tablet Take 1 tablet (40 mg total) by mouth daily. Patient taking differently: Take 20 mg by mouth daily. 06/14/21  Yes Laurey Morale, MD  glucose 4 GM chewable tablet Chew 1 tablet (4 g total) by mouth as needed for low blood sugar (< 70). 01/16/19  Yes Dessa Phi, DO  hydrOXYzine (VISTARIL) 25 MG capsule Take 1 capsule (25 mg total) by mouth See admin instructions. Take 1 capsule by mouth three times daily as needed. 04/09/22  Yes Laurey Morale, MD  Hyprom-Naphaz-Polysorb-Zn Sulf (CLEAR EYES COMPLETE) SOLN Place 1-2 drops into both eyes as needed (for itching).   Yes [provider]  insulin aspart (NOVOLOG) 100 UNIT/ML injection Use sliding scale provided at time of  discharge, administer 3 times daily with meals Patient taking differently: Inject 15-48 Units into the skin See admin instructions. Inject 20-40 units subcutaneously 3 times daily with meals and inject 15-35 units subcutaneously at night if needed with nighttime snacking 01/16/19 05/12/24 Yes Dessa Phi, DO  insulin glargine, 2 Unit Dial, (TOUJEO MAX SOLOSTAR) 300 UNIT/ML Solostar Pen Inject 80 Units into the skin 2 (two) times daily.   Yes [provider]  levothyroxine (SYNTHROID) 175 MCG tablet TAKE 1 TABLET BY MOUTH ONCE DAILY BEFORE BREAKFAST 04/11/21  Yes Laurey Morale, MD  methocarbamol (ROBAXIN) 500 MG tablet Take 1 tablet (500 mg total) by mouth every 6 (six) hours as needed for muscle spasms. 05/24/21  Yes Laurey Morale, MD  methylPREDNISolone (MEDROL DOSEPAK) 4 MG TBPK tablet As directed 03/18/22  Yes Laurey Morale, MD  naproxen (NAPROSYN) 500 MG  tablet Take 1 tablet (500 mg total) by mouth 2 (two) times daily. Patient taking differently: Take 500 mg by mouth 2 (two) times daily as needed (pain.). 10/30/21  Yes Laurey Morale, MD  Nerve Stimulator (CLEVER CHOICE TENS UNIT) DEVI Place 1 Dose onto the skin as needed (pain.).   Yes [provider]  nystatin (MYCOSTATIN/NYSTOP) powder Apply 1 application topically 2 (two) times daily as needed (rash). 01/21/21  Yes [provider]  OXYGEN Inhale 2 L into the lungs continuous.   Yes [provider]  primidone (MYSOLINE) 50 MG tablet TAKE 1 TABLET BY MOUTH AT BEDTIME 05/13/22  Yes Tat, Eustace Quail, DO  Probiotic Product (PROBIOTIC-10 PO) Take 1 capsule by mouth every evening.   Yes [provider]  Sodium Sulfate-Mag Sulfate-KCl (SUTAB) [phone] MG TABS As directed by GI offfice for colon prep 03/14/22  Yes Zian Mohamed, Venia Minks, MD  temazepam (RESTORIL) 15 MG capsule Take 15 mg by mouth at bedtime. 10/07/21  Yes [provider]  tirzepatide Darcel Bayley) 7.5 MG/0.5ML Pen Inject 7.5 mg into the  skin every Sunday.   Yes [provider]  Insulin Pen Needle (B-D UF III MINI PEN NEEDLES) 31G X 5 MM MISC Use to administer insulin five times a day 11/06/20   [provider]    Current Facility-Administered Medications  Medication Dose Route Frequency Provider Last Rate Last Admin   lactated ringers infusion   Intravenous Continuous Stark Aguinaga, Venia Minks, MD        Allergies as of 03/14/2022 - Review Complete 03/04/2022  Allergen Reaction Noted   Aspirin  01/10/2019   Hydrocodone-acetaminophen Hives 11/03/2007   Oxycodone-acetaminophen Hives 11/03/2007   Penicillins Hives 11/03/2007   Codeine Hives 11/03/2007   Fluoxetine hcl Itching and Other (See Comments) 11/03/2007   Trazodone Other (See Comments) 02/02/2019    Family History  Problem Relation Age of Onset   Arthritis/Rheumatoid Mother    Alzheimer's disease Father    Diabetes Mellitus II Father    Dementia Father    Diabetes Mellitus II Sister    Diabetes Mellitus II Brother    Asthma Other        fhx   Depression Other        fhx   Diabetes Other        fhx   Parkinsonism Other        fhx   Lupus Other        fhx    Social History   Socioeconomic History   Marital status: Divorced    Spouse name: Not on file   Number of children: 4   Years of education: 13   Highest education level: Some college, no degree  Occupational History    Comment: retired  Tobacco Use   Smoking status: Former    Packs/day: 1.00    Years: 35.00    Total pack years: 35.00    Types: Cigarettes    Quit date: 12/01/2008    Years since quitting: 13.4   Smokeless tobacco: Never  Vaping Use   Vaping Use: Never used  Substance and Sexual Activity   Alcohol use: Not Currently   Drug use: No   Sexual activity: Not Currently  Other Topics Concern   Not on file  Social History Narrative   Lives alone.   She has four grown children.   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   Highest  level of education:  1.5 years of college  Right Handed   Is on 2 L of oxygen    Social Determinants of Health   Financial Resource Strain: Low Risk  (04/15/2022)   Overall Financial Resource Strain (CARDIA)    Difficulty of Paying Living Expenses: Not hard at all  Food Insecurity: No Food Insecurity (04/15/2022)   Hunger Vital Sign    Worried About Running Out of Food in the Last Year: Never true    Ran Out of Food in the Last Year: Never true  Transportation Needs: No Transportation Needs (04/15/2022)   PRAPARE - Hydrologist (Medical): No    Lack of Transportation (Non-Medical): No  Physical Activity: Inactive (04/15/2022)   Exercise Vital Sign    Days of Exercise per Week: 0 days    Minutes of Exercise per Session: 0 min  Stress: No Stress Concern Present (04/15/2022)   [place]    Feeling of Stress : Only a little  Social Connections: Socially Isolated (04/15/2022)   Social Connection and Isolation Panel [NHANES]    Frequency of Communication with Friends and Family: More than three times a week    Frequency of Social Gatherings with Friends and Family: More than three times a week    Attends Religious Services: Never    Marine scientist or Organizations: No    Attends Archivist Meetings: Never    Marital Status: Divorced  Human resources officer Violence: Not At Risk (04/15/2022)   Humiliation, Afraid, Rape, and Kick questionnaire    Fear of Current or Ex-Partner: No    Emotionally Abused: No    Physically Abused: No    Sexually Abused: No    Review of Systems:  All other review of systems negative except as mentioned in the HPI.  Physical Exam: Vital signs in last 24 hours:  BP (!) 135/52   Pulse 93   Temp 98.3 F (36.8 C) (Temporal)   Resp 19   SpO2 95%     General:   Alert, NAD Lungs:  Clear .   Heart:  Regular rate and rhythm Abdomen:  Soft,  nontender and nondistended. Neuro/Psych:  Alert and cooperative. Normal mood and affect. A and O x 3   K. Denzil Magnuson , MD [phone]

## 2022-05-15 NOTE — Telephone Encounter (Signed)
The RX is ready (on your desk)

## 2022-05-15 NOTE — Anesthesia Preprocedure Evaluation (Signed)
Anesthesia Evaluation  Patient identified by MRN, date of birth, ID band Patient awake    Reviewed: Allergy & Precautions, NPO status , Patient's Chart, lab work & pertinent test results  History of Anesthesia Complications Negative for: history of anesthetic complications  Airway Mallampati: III  TM Distance: >3 FB Neck ROM: Full    Dental  (+) Upper Dentures, Dental Advisory Given   Pulmonary shortness of breath and Long-Term Oxygen Therapy, sleep apnea and Continuous Positive Airway Pressure Ventilation , pneumonia, resolved, COPD,  COPD inhaler and oxygen dependent, former smoker,    breath sounds clear to auscultation       Cardiovascular hypertension, (-) angina+CHF and + DOE  (-) Past MI  Rhythm:Regular  1. Left ventricular ejection fraction, by estimation, is 60 to 65%. The  left ventricle has normal function. The left ventricle has no regional  wall motion abnormalities. Left ventricular diastolic parameters are  consistent with Grade I diastolic  dysfunction (impaired relaxation).  2. Right ventricular systolic function is normal. The right ventricular  size is normal. There is normal pulmonary artery systolic pressure. The  estimated right ventricular systolic pressure is 65.0 mmHg.  3. The mitral valve is grossly normal. Trivial mitral valve  regurgitation. No evidence of mitral stenosis.  4. The aortic valve is tricuspid. There is mild calcification of the  aortic valve. There is mild thickening of the aortic valve. Aortic valve  regurgitation is not visualized. Mild aortic valve sclerosis is present,  with no evidence of aortic valve  stenosis.  5. The inferior vena cava is normal in size with greater than 50%  respiratory variability, suggesting right atrial pressure of 3 mmHg.    Neuro/Psych  Headaches, PSYCHIATRIC DISORDERS Anxiety Depression Noticing numbness in hands which comes and goes not impacted by  neck movement    GI/Hepatic Neg liver ROS, GERD  ,  Endo/Other  diabetes, Type 2, Insulin DependentHypothyroidism   Renal/GU negative Renal ROS     Musculoskeletal  (+) Arthritis ,   Abdominal   Peds  Hematology negative hematology ROS (+) Lab Results      Component                Value               Date                      WBC                      9.9                 03/04/2022                HGB                      12.0                03/04/2022                HCT                      38.1                03/04/2022                MCV  85.2                03/04/2022                PLT                      196.0               03/04/2022              Anesthesia Other Findings   Reproductive/Obstetrics                             Anesthesia Physical Anesthesia Plan  ASA: 3  Anesthesia Plan: MAC   Post-op Pain Management: Minimal or no pain anticipated   Induction: Intravenous  PONV Risk Score and Plan: 2 and Propofol infusion  Airway Management Planned: Nasal Cannula and Natural Airway  Additional Equipment: None  Intra-op Plan:   Post-operative Plan:   Informed Consent: I have reviewed the patients History and Physical, chart, labs and discussed the procedure including the risks, benefits and alternatives for the proposed anesthesia with the patient or authorized representative who has indicated his/her understanding and acceptance.     Dental advisory given  Plan Discussed with: CRNA  Anesthesia Plan Comments:         Anesthesia Quick Evaluation

## 2022-05-16 LAB — SURGICAL PATHOLOGY

## 2022-05-16 NOTE — Telephone Encounter (Signed)
Ok to add a  hand bicycle to help with exercise

## 2022-05-16 NOTE — Anesthesia Postprocedure Evaluation (Signed)
Anesthesia Post Note  Patient: Kameren Dimas Millin  Procedure(s) Performed: COLONOSCOPY WITH PROPOFOL POLYPECTOMY HOT HEMOSTASIS (ARGON PLASMA COAGULATION/BICAP)     Patient location during evaluation: PACU Anesthesia Type: MAC Level of consciousness: awake and alert Pain management: pain level controlled Vital Signs Assessment: post-procedure vital signs reviewed and stable Respiratory status: spontaneous breathing, nonlabored ventilation, respiratory function stable and patient connected to nasal cannula oxygen Cardiovascular status: stable and blood pressure returned to baseline Postop Assessment: no apparent nausea or vomiting Anesthetic complications: no   No notable events documented.  Last Vitals:  Vitals:   05/15/22 1130 05/15/22 1140  BP: (!) 146/48 (!) 150/52  Pulse: 93 89  Resp: 14 14  Temp:    SpO2: 93% 94%    Last Pain:  Vitals:   05/15/22 1140  TempSrc:   PainSc: 0-No pain                 Alyra Patty

## 2022-05-16 NOTE — Telephone Encounter (Addendum)
Spoke with patient about message from Dr. Sarajane Jews.   Requesting for prescription for walker be faxed to [facility] on [facility].

## 2022-05-16 NOTE — Telephone Encounter (Signed)
Her insurance will not cover the hand bicycle. This would need to come from a Rehab specialist

## 2022-05-16 NOTE — Telephone Encounter (Signed)
Patient called because she would like the prescription faxed over to medical supply company and she would also like a hand bicycle included to help her exercise.       Please advise

## 2022-05-18 ENCOUNTER — Encounter (HOSPITAL_COMMUNITY): Payer: Self-pay | Admitting: Gastroenterology

## 2022-05-19 DIAGNOSIS — J438 Other emphysema: Secondary | ICD-10-CM | POA: Diagnosis not present

## 2022-05-19 DIAGNOSIS — R059 Cough, unspecified: Secondary | ICD-10-CM | POA: Diagnosis not present

## 2022-05-20 ENCOUNTER — Other Ambulatory Visit: Payer: Self-pay

## 2022-05-20 DIAGNOSIS — R251 Tremor, unspecified: Secondary | ICD-10-CM

## 2022-05-20 DIAGNOSIS — Z1231 Encounter for screening mammogram for malignant neoplasm of breast: Secondary | ICD-10-CM | POA: Diagnosis not present

## 2022-05-20 DIAGNOSIS — J9611 Chronic respiratory failure with hypoxia: Secondary | ICD-10-CM

## 2022-05-20 NOTE — Telephone Encounter (Signed)
Pt DME order for Rollator walker with attached seat was faxed to [place]

## 2022-05-21 DIAGNOSIS — J449 Chronic obstructive pulmonary disease, unspecified: Secondary | ICD-10-CM | POA: Diagnosis not present

## 2022-05-24 DIAGNOSIS — J432 Centrilobular emphysema: Secondary | ICD-10-CM | POA: Diagnosis not present

## 2022-05-24 DIAGNOSIS — J9611 Chronic respiratory failure with hypoxia: Secondary | ICD-10-CM | POA: Diagnosis not present

## 2022-05-24 DIAGNOSIS — J449 Chronic obstructive pulmonary disease, unspecified: Secondary | ICD-10-CM | POA: Diagnosis not present

## 2022-05-26 ENCOUNTER — Telehealth: Payer: Self-pay | Admitting: Family Medicine

## 2022-05-26 ENCOUNTER — Telehealth: Payer: Self-pay | Admitting: Cardiology

## 2022-05-26 DIAGNOSIS — I10 Essential (primary) hypertension: Secondary | ICD-10-CM

## 2022-05-26 DIAGNOSIS — I5032 Chronic diastolic (congestive) heart failure: Secondary | ICD-10-CM

## 2022-05-26 NOTE — Telephone Encounter (Signed)
Patient had requested a prescription for a Rolator walker with a seat.  She states it would need to be faxed to [facility] on [facility].  Pt states it was supposed to be faxed 2 weeks ago. Her Rollator is broken.

## 2022-05-28 ENCOUNTER — Other Ambulatory Visit: Payer: HMO

## 2022-05-28 ENCOUNTER — Ambulatory Visit: Payer: HMO

## 2022-05-28 VITALS — BP 144/62 | HR 77 | Wt 275.0 lb

## 2022-05-28 DIAGNOSIS — R0609 Other forms of dyspnea: Secondary | ICD-10-CM

## 2022-05-28 DIAGNOSIS — I10 Essential (primary) hypertension: Secondary | ICD-10-CM | POA: Diagnosis not present

## 2022-05-28 DIAGNOSIS — I5032 Chronic diastolic (congestive) heart failure: Secondary | ICD-10-CM | POA: Diagnosis not present

## 2022-05-28 DIAGNOSIS — J449 Chronic obstructive pulmonary disease, unspecified: Secondary | ICD-10-CM | POA: Diagnosis not present

## 2022-05-28 NOTE — Progress Notes (Signed)
   Nurse Visit   Date of Encounter: 05/28/2022 ID: Bonnie Gonzalez, Hiraldo 23-Nov-1956, MRN [id number]  PCP:  Laurey Morale, MD   [facility] Providers Cardiologist:  Fransico Him, MD      Visit Details   VS:  BP (!) 144/62   Pulse 77   Wt 275 lb (124.7 kg)   SpO2 91% Comment: 2 Liters O2  BMI 53.71 kg/m  , BMI Body mass index is 53.71 kg/m.  Wt Readings from Last 3 Encounters:  05/28/22 275 lb (124.7 kg)  04/15/22 277 lb (125.6 kg)  04/01/22 277 lb (125.6 kg)     Reason for visit: EKG and BMET/BNP Performed today: Vitals, EKG, Provider consulted:Dr. Caryl Comes, and Education Changes (medications, testing, etc.) : No Changes Length of Visit: 60 minutes  Patient contacted [facility] concerned about SOB with exertion and possibility of Afib that was seen on television commercial.  Dr. Radford Pax ordered EKG, BMET and BNP.   Pt needed wheelchair and was on 2 liters of home O2 during visit at [facility] [address].    Patient BP 144/62, with pulse of 77 BPM.  Pt weight 275. Pt tolerated EKG, and was found in NSR reviewed by DOD, Dr. Caryl Comes.    Pt stated completed Medrol Dosepak, and Probiotic.  Pt also stated taking Naproxen 500 mg, Mucinex and Zyrtec OTC.  Pt medication recorded updated.    No new medication orders per Dr. Caryl Comes;  Waiting on BMET and BNP results.        Signed, Varney Daily, RN  05/28/2022 4:28 PM

## 2022-05-29 LAB — BASIC METABOLIC PANEL
BUN/Creatinine Ratio: 20 (ref 12–28)
BUN: 13 mg/dL (ref 8–27)
CO2: 30 mmol/L — ABNORMAL HIGH (ref 20–29)
Calcium: 9.6 mg/dL (ref 8.7–10.3)
Chloride: 94 mmol/L — ABNORMAL LOW (ref 96–106)
Creatinine, Ser: 0.64 mg/dL (ref 0.57–1.00)
Glucose: 137 mg/dL — ABNORMAL HIGH (ref 70–99)
Potassium: 4.1 mmol/L (ref 3.5–5.2)
Sodium: 141 mmol/L (ref 134–144)
eGFR: 97 mL/min/{1.73_m2} (ref >59)

## 2022-05-29 LAB — PRO B NATRIURETIC PEPTIDE: NT-Pro BNP: <36 pg/mL (ref 0–301)

## 2022-06-02 ENCOUNTER — Other Ambulatory Visit: Payer: Self-pay | Admitting: Neurology

## 2022-06-02 DIAGNOSIS — J449 Chronic obstructive pulmonary disease, unspecified: Secondary | ICD-10-CM | POA: Diagnosis not present

## 2022-06-02 DIAGNOSIS — R0609 Other forms of dyspnea: Secondary | ICD-10-CM | POA: Diagnosis not present

## 2022-06-02 DIAGNOSIS — K219 Gastro-esophageal reflux disease without esophagitis: Secondary | ICD-10-CM | POA: Diagnosis not present

## 2022-06-09 ENCOUNTER — Telehealth: Payer: Self-pay | Admitting: Family Medicine

## 2022-06-09 NOTE — Telephone Encounter (Signed)
Pt requesting a referral for physical therapy

## 2022-06-09 NOTE — Telephone Encounter (Signed)
Called spoke with patient.   She is requesting a referral home health therapy for bilateral knees, hips, and lower back.  Patient stated that she feels she is getting worse with mobility.    Please advise.

## 2022-06-09 NOTE — Telephone Encounter (Signed)
Please contact home health for these issues

## 2022-06-12 ENCOUNTER — Other Ambulatory Visit: Payer: Self-pay

## 2022-06-13 ENCOUNTER — Other Ambulatory Visit: Payer: Self-pay

## 2022-06-13 DIAGNOSIS — M545 Low back pain, unspecified: Secondary | ICD-10-CM

## 2022-06-13 DIAGNOSIS — M8589 Other specified disorders of bone density and structure, multiple sites: Secondary | ICD-10-CM

## 2022-06-13 NOTE — Telephone Encounter (Signed)
Referral to Home Health was placed today

## 2022-06-16 ENCOUNTER — Other Ambulatory Visit: Payer: Self-pay | Admitting: Family Medicine

## 2022-06-16 ENCOUNTER — Ambulatory Visit (INDEPENDENT_AMBULATORY_CARE_PROVIDER_SITE_OTHER): Payer: PPO | Admitting: Family Medicine

## 2022-06-16 ENCOUNTER — Encounter: Payer: Self-pay | Admitting: Family Medicine

## 2022-06-16 VITALS — BP 142/62 | HR 94 | Temp 97.6°F | Wt 278.0 lb

## 2022-06-16 DIAGNOSIS — G8929 Other chronic pain: Secondary | ICD-10-CM | POA: Diagnosis not present

## 2022-06-16 DIAGNOSIS — J439 Emphysema, unspecified: Secondary | ICD-10-CM

## 2022-06-16 DIAGNOSIS — R5381 Other malaise: Secondary | ICD-10-CM

## 2022-06-16 DIAGNOSIS — M545 Low back pain, unspecified: Secondary | ICD-10-CM | POA: Diagnosis not present

## 2022-06-16 DIAGNOSIS — J9611 Chronic respiratory failure with hypoxia: Secondary | ICD-10-CM

## 2022-06-16 NOTE — Progress Notes (Signed)
   Subjective:    Patient ID: Bonnie Gonzalez, female    DOB: 1956/07/24, 66 y.o.   MRN: [id number]  HPI Here asking for advice about overall deconditioning. She is very SOB and very obese, and she cannot walk but a few steps before she has to sit down and rest. She uses continuous [facility] oxygen, and she keeps this on 2 liters of flow most of the time. She saw Pulmonology on 06-02-22 and they have referred her for virtual pulmonary PT. She wants to exercise but she cannot because of her weight and her SOB.    Review of Systems  Constitutional:  Positive for fatigue.  Respiratory:  Positive for shortness of breath. Negative for cough and wheezing.   Cardiovascular:  Positive for leg swelling. Negative for chest pain and palpitations.       Objective:   Physical Exam Constitutional:      Comments: In her wheelchair. Morbidly obese   Cardiovascular:     Rate and Rhythm: Normal rate and regular rhythm.     Pulses: Normal pulses.     Heart sounds: Normal heart sounds.  Pulmonary:     Effort: Pulmonary effort is normal.     Breath sounds: Normal breath sounds.  Neurological:     Mental Status: She is alert.           Assessment & Plan:  She has extreme deconditioning from a combination of obesity and COPD. We will consult home health PT to come to her home to work on overall conditioning.  [name], MD

## 2022-06-16 NOTE — Progress Notes (Unsigned)
Assessment/Plan:    1.  Tremor  -As previous, the medications for her lungs are likely exacerbating this, even though she certainly may have a degree of essential tremor as well.   2.  Memory change  -Neurocognitive testing in August, 2022 was normal.  No evidence of a neurodegenerative process.  She stopped the donepezil that she had been on for several years.  3.  Follow-up 6 months.  Subjective:   Bonnie Gonzalez was seen today in follow up for tremor.  My previous records were reviewed prior to todays visit.  Last visit, the patient was describing spells of violent tremor to me.  I discussed with her that it may be helpful to potentially video some of the episodes that she was having.  We did decide to start primidone, 50 mg daily.  She reports that ***.    PREVIOUS MEDICATIONS: none to date  ALLERGIES:   Allergies  Allergen Reactions   Aspirin     Mother had to be resuscitated after ingesting this   Hydrocodone-Acetaminophen Hives   Oxycodone-Acetaminophen Hives   Penicillins Hives    Did it involve swelling of the face/tongue/throat, SOB, or low BP? Yes-hives Did it involve sudden or severe rash/hives, skin peeling, or any reaction on the inside of your mouth or nose? Unk Did you need to seek medical attention at a hospital or doctor's office? Yes When did it last happen? Was in her 41's If all above answers are "NO", may proceed with cephalosporin use.  Did it involve swelling of the face/tongue/throat, SOB, or low BP? Yes-hives Did it involve sudden or severe rash/hives, skin peeling, or any reaction on the inside of your mouth or nose? Unk Did you need to seek medical attention at a hospital or doctor's office? Yes When did it last happen? Was in her 74's If all above answers are "NO", may proceed with cephalosporin use.   Codeine Hives   Fluoxetine Hcl Itching and Other (See Comments)    "Hair itching"   Trazodone Other (See Comments)    Couldn't walk     CURRENT MEDICATIONS:  Outpatient Encounter Medications as of 06/17/2022  Medication Sig   albuterol (PROVENTIL HFA;VENTOLIN HFA) 108 (90 Base) MCG/ACT inhaler Inhale 2 puffs into the lungs every 4 (four) hours as needed for wheezing or shortness of breath.   ALPRAZolam (XANAX) 0.5 MG tablet TAKE 1 TABLET (0.5 MG TOTAL) BY MOUTH 2 (TWO) TIMES DAILY AS NEEDED. (Patient taking differently: Take 0.25-0.5 mg by mouth 2 (two) times daily as needed for anxiety or sleep.)   atorvastatin (LIPITOR) 80 MG tablet Take 1 tablet (80 mg total) by mouth daily.   azelastine (ASTELIN) 0.1 % nasal spray Place 1 spray into the nose 2 (two) times daily as needed for allergies.   benzonatate (TESSALON) 200 MG capsule Take 1 capsule (200 mg total) by mouth every 8 (eight) hours as needed for cough.   cetirizine (ZYRTEC) 10 MG tablet Take 10 mg by mouth daily.   Cholecalciferol 125 MCG (5000 UT) TABS Take 5,000 Units by mouth in the morning.   Dextromethorphan-guaiFENesin (MUCINEX COUGH & CHEST CONGEST PO) Take 1 capsule by mouth as needed (Patient taking as needed).   DULoxetine (CYMBALTA) 60 MG capsule Take 1 capsule (60 mg total) by mouth daily. (Patient taking differently: Take 120 mg by mouth at bedtime.)   esomeprazole (NEXIUM) 20 MG capsule Take 20 mg by mouth daily before breakfast.   Fluticasone-Umeclidin-Vilant (TRELEGY ELLIPTA) 100-62.5-25 MCG/INH  AEPB Inhale 1 puff into the lungs daily.   furosemide (LASIX) 40 MG tablet Take 1 tablet (40 mg total) by mouth daily. (Patient taking differently: Take 20 mg by mouth daily.)   glucose 4 GM chewable tablet Chew 1 tablet (4 g total) by mouth as needed for low blood sugar (< 70).   hydrOXYzine (VISTARIL) 25 MG capsule Take 1 capsule (25 mg total) by mouth See admin instructions. Take 1 capsule by mouth three times daily as needed.   Hyprom-Naphaz-Polysorb-Zn Sulf (CLEAR EYES COMPLETE) SOLN Place 1-2 drops into both eyes as needed (for itching).   insulin aspart  (NOVOLOG) 100 UNIT/ML injection Use sliding scale provided at time of discharge, administer 3 times daily with meals (Patient taking differently: Inject 15-48 Units into the skin See admin instructions. Inject 20-40 units subcutaneously 3 times daily with meals and inject 15-35 units subcutaneously at night if needed with nighttime snacking)   insulin glargine, 2 Unit Dial, (TOUJEO MAX SOLOSTAR) 300 UNIT/ML Solostar Pen Inject 80 Units into the skin 2 (two) times daily.   Insulin Pen Needle (B-D UF III MINI PEN NEEDLES) 31G X 5 MM MISC Use to administer insulin five times a day   levothyroxine (SYNTHROID) 175 MCG tablet TAKE 1 TABLET BY MOUTH ONCE DAILY BEFORE BREAKFAST   methocarbamol (ROBAXIN) 500 MG tablet Take 1 tablet (500 mg total) by mouth every 6 (six) hours as needed for muscle spasms.   methylPREDNISolone (MEDROL DOSEPAK) 4 MG TBPK tablet Take 4 mg by mouth as directed.   naproxen (NAPROSYN) 500 MG tablet Take 500 mg by mouth as needed.   Nerve Stimulator (CLEVER CHOICE TENS UNIT) DEVI Place 1 Dose onto the skin as needed (pain.).   nystatin (MYCOSTATIN/NYSTOP) powder Apply 1 application topically 2 (two) times daily as needed (rash).   OXYGEN Inhale 2 L into the lungs continuous.   primidone (MYSOLINE) 50 MG tablet TAKE 1 TABLET BY MOUTH AT BEDTIME   Probiotic Product (MISC INTESTINAL FLORA REGULAT) CAPS Take 1 capsule by mouth daily.   temazepam (RESTORIL) 15 MG capsule Take 15 mg by mouth at bedtime.   tirzepatide [facility]) 7.5 MG/0.5ML Pen Inject 7.5 mg into the skin every Sunday.   No facility-administered encounter medications on file as of 06/17/2022.     Objective:    PHYSICAL EXAMINATION:    VITALS:   There were no vitals filed for this visit.   Cardiovascular: Regular rate rhythm Lungs: Clear to auscultation bilaterally.  Wearing oxygen. Neck: No bruits  Orientation:  The patient is alert and oriented x 3.   Cranial nerves: There is good facial symmetry.  Extraocular muscles are intact and visual fields are full to confrontational testing. Speech is fluent and clear. Soft palate rises symmetrically and there is no tongue deviation. Hearing is intact to conversational tone. Tone: Tone is good throughout. Sensation: Sensation is intact to light touch touch throughout Coordination:  The patient has no dysdiadichokinesia or dysmetria. Motor: Strength is at least antigravity x4. Gait and Station: The patient pushes off of the wheelchair to arise.  She is given a walker.  She ambulates fairly well with a walker.   MOVEMENT EXAM: Tremor:  There is no rest tremor.  No postural tremor.  Very little intention tremor.  After her right hand is held in the proximal position for a while (approximating the left hand), she then develops somewhat of a flapping tremor in the right arm.  There is some entrainment to this.  We then  had her placed the right arm in the lap and started the same sequence over, but were not able to elicit the same thing. I have reviewed and interpreted the following labs independently   Chemistry      Component Value Date/Time   NA 141 05/28/2022 1448   K 4.1 05/28/2022 1448   CL 94 (L) 05/28/2022 1448   CO2 30 (H) 05/28/2022 1448   BUN 13 05/28/2022 1448   CREATININE 0.64 05/28/2022 1448      Component Value Date/Time   CALCIUM 9.6 05/28/2022 1448   ALKPHOS 92 03/04/2022 1003   AST 25 03/04/2022 1003   ALT 29 03/04/2022 1003   BILITOT 0.4 03/04/2022 1003      Lab Results  Component Value Date   WBC 9.9 03/04/2022   HGB 12.0 03/04/2022   HCT 38.1 03/04/2022   MCV 85.2 03/04/2022   PLT 196.0 03/04/2022   Lab Results  Component Value Date   TSH 2.87 03/04/2022     Chemistry      Component Value Date/Time   NA 141 05/28/2022 1448   K 4.1 05/28/2022 1448   CL 94 (L) 05/28/2022 1448   CO2 30 (H) 05/28/2022 1448   BUN 13 05/28/2022 1448   CREATININE 0.64 05/28/2022 1448      Component Value Date/Time   CALCIUM  9.6 05/28/2022 1448   ALKPHOS 92 03/04/2022 1003   AST 25 03/04/2022 1003   ALT 29 03/04/2022 1003   BILITOT 0.4 03/04/2022 1003      Total time spent on today's visit was *** minutes, including both face-to-face time and nonface-to-face time.  Time included that spent on review of records (prior notes available to me/labs/imaging if pertinent), discussing treatment and goals, answering patient's questions and coordinating care.    Cc:  Laurey Morale, MD

## 2022-06-17 ENCOUNTER — Ambulatory Visit: Payer: PPO | Admitting: Neurology

## 2022-06-17 ENCOUNTER — Encounter: Payer: Self-pay | Admitting: Neurology

## 2022-06-17 VITALS — BP 139/71 | HR 79 | Ht 60.0 in | Wt 278.0 lb

## 2022-06-17 DIAGNOSIS — R4189 Other symptoms and signs involving cognitive functions and awareness: Secondary | ICD-10-CM

## 2022-06-17 DIAGNOSIS — G250 Essential tremor: Secondary | ICD-10-CM | POA: Diagnosis not present

## 2022-06-17 MED ORDER — PRIMIDONE 50 MG PO TABS
50.0000 mg | ORAL_TABLET | Freq: Every day | ORAL | 2 refills | Status: DC
Start: 2022-06-17 — End: 2023-03-19

## 2022-06-18 DIAGNOSIS — J309 Allergic rhinitis, unspecified: Secondary | ICD-10-CM | POA: Diagnosis not present

## 2022-06-18 DIAGNOSIS — K635 Polyp of colon: Secondary | ICD-10-CM | POA: Diagnosis not present

## 2022-06-18 DIAGNOSIS — I110 Hypertensive heart disease with heart failure: Secondary | ICD-10-CM | POA: Diagnosis not present

## 2022-06-18 DIAGNOSIS — E559 Vitamin D deficiency, unspecified: Secondary | ICD-10-CM | POA: Diagnosis not present

## 2022-06-18 DIAGNOSIS — F329 Major depressive disorder, single episode, unspecified: Secondary | ICD-10-CM | POA: Diagnosis not present

## 2022-06-18 DIAGNOSIS — Z9981 Dependence on supplemental oxygen: Secondary | ICD-10-CM | POA: Diagnosis not present

## 2022-06-18 DIAGNOSIS — F410 Panic disorder [episodic paroxysmal anxiety] without agoraphobia: Secondary | ICD-10-CM | POA: Diagnosis not present

## 2022-06-18 DIAGNOSIS — K5521 Angiodysplasia of colon with hemorrhage: Secondary | ICD-10-CM | POA: Diagnosis not present

## 2022-06-18 DIAGNOSIS — E782 Mixed hyperlipidemia: Secondary | ICD-10-CM | POA: Diagnosis not present

## 2022-06-18 DIAGNOSIS — J432 Centrilobular emphysema: Secondary | ICD-10-CM | POA: Diagnosis not present

## 2022-06-18 DIAGNOSIS — Z87891 Personal history of nicotine dependence: Secondary | ICD-10-CM | POA: Diagnosis not present

## 2022-06-18 DIAGNOSIS — M8589 Other specified disorders of bone density and structure, multiple sites: Secondary | ICD-10-CM | POA: Diagnosis not present

## 2022-06-18 DIAGNOSIS — K219 Gastro-esophageal reflux disease without esophagitis: Secondary | ICD-10-CM | POA: Diagnosis not present

## 2022-06-18 DIAGNOSIS — M545 Low back pain, unspecified: Secondary | ICD-10-CM | POA: Diagnosis not present

## 2022-06-18 DIAGNOSIS — Z794 Long term (current) use of insulin: Secondary | ICD-10-CM | POA: Diagnosis not present

## 2022-06-18 DIAGNOSIS — E039 Hypothyroidism, unspecified: Secondary | ICD-10-CM | POA: Diagnosis not present

## 2022-06-18 DIAGNOSIS — E119 Type 2 diabetes mellitus without complications: Secondary | ICD-10-CM | POA: Diagnosis not present

## 2022-06-18 DIAGNOSIS — G4733 Obstructive sleep apnea (adult) (pediatric): Secondary | ICD-10-CM | POA: Diagnosis not present

## 2022-06-18 DIAGNOSIS — I5032 Chronic diastolic (congestive) heart failure: Secondary | ICD-10-CM | POA: Diagnosis not present

## 2022-06-18 DIAGNOSIS — F5104 Psychophysiologic insomnia: Secondary | ICD-10-CM | POA: Diagnosis not present

## 2022-06-18 DIAGNOSIS — K589 Irritable bowel syndrome without diarrhea: Secondary | ICD-10-CM | POA: Diagnosis not present

## 2022-06-18 DIAGNOSIS — J8410 Pulmonary fibrosis, unspecified: Secondary | ICD-10-CM | POA: Diagnosis not present

## 2022-06-18 DIAGNOSIS — J9611 Chronic respiratory failure with hypoxia: Secondary | ICD-10-CM | POA: Diagnosis not present

## 2022-06-20 DIAGNOSIS — J449 Chronic obstructive pulmonary disease, unspecified: Secondary | ICD-10-CM | POA: Diagnosis not present

## 2022-06-23 DIAGNOSIS — I5032 Chronic diastolic (congestive) heart failure: Secondary | ICD-10-CM | POA: Diagnosis not present

## 2022-06-23 DIAGNOSIS — J309 Allergic rhinitis, unspecified: Secondary | ICD-10-CM | POA: Diagnosis not present

## 2022-06-23 DIAGNOSIS — E559 Vitamin D deficiency, unspecified: Secondary | ICD-10-CM | POA: Diagnosis not present

## 2022-06-23 DIAGNOSIS — J432 Centrilobular emphysema: Secondary | ICD-10-CM | POA: Diagnosis not present

## 2022-06-23 DIAGNOSIS — E039 Hypothyroidism, unspecified: Secondary | ICD-10-CM | POA: Diagnosis not present

## 2022-06-23 DIAGNOSIS — Z9981 Dependence on supplemental oxygen: Secondary | ICD-10-CM | POA: Diagnosis not present

## 2022-06-23 DIAGNOSIS — J8410 Pulmonary fibrosis, unspecified: Secondary | ICD-10-CM | POA: Diagnosis not present

## 2022-06-23 DIAGNOSIS — J449 Chronic obstructive pulmonary disease, unspecified: Secondary | ICD-10-CM | POA: Diagnosis not present

## 2022-06-23 DIAGNOSIS — K5521 Angiodysplasia of colon with hemorrhage: Secondary | ICD-10-CM | POA: Diagnosis not present

## 2022-06-23 DIAGNOSIS — G4733 Obstructive sleep apnea (adult) (pediatric): Secondary | ICD-10-CM | POA: Diagnosis not present

## 2022-06-23 DIAGNOSIS — K219 Gastro-esophageal reflux disease without esophagitis: Secondary | ICD-10-CM | POA: Diagnosis not present

## 2022-06-23 DIAGNOSIS — I110 Hypertensive heart disease with heart failure: Secondary | ICD-10-CM | POA: Diagnosis not present

## 2022-06-23 DIAGNOSIS — M545 Low back pain, unspecified: Secondary | ICD-10-CM | POA: Diagnosis not present

## 2022-06-23 DIAGNOSIS — K589 Irritable bowel syndrome without diarrhea: Secondary | ICD-10-CM | POA: Diagnosis not present

## 2022-06-23 DIAGNOSIS — F329 Major depressive disorder, single episode, unspecified: Secondary | ICD-10-CM | POA: Diagnosis not present

## 2022-06-23 DIAGNOSIS — E119 Type 2 diabetes mellitus without complications: Secondary | ICD-10-CM | POA: Diagnosis not present

## 2022-06-23 DIAGNOSIS — F5104 Psychophysiologic insomnia: Secondary | ICD-10-CM | POA: Diagnosis not present

## 2022-06-23 DIAGNOSIS — M8589 Other specified disorders of bone density and structure, multiple sites: Secondary | ICD-10-CM | POA: Diagnosis not present

## 2022-06-23 DIAGNOSIS — E782 Mixed hyperlipidemia: Secondary | ICD-10-CM | POA: Diagnosis not present

## 2022-06-23 DIAGNOSIS — J9611 Chronic respiratory failure with hypoxia: Secondary | ICD-10-CM | POA: Diagnosis not present

## 2022-06-23 DIAGNOSIS — F410 Panic disorder [episodic paroxysmal anxiety] without agoraphobia: Secondary | ICD-10-CM | POA: Diagnosis not present

## 2022-06-23 DIAGNOSIS — K635 Polyp of colon: Secondary | ICD-10-CM | POA: Diagnosis not present

## 2022-06-23 DIAGNOSIS — Z794 Long term (current) use of insulin: Secondary | ICD-10-CM | POA: Diagnosis not present

## 2022-06-23 DIAGNOSIS — Z87891 Personal history of nicotine dependence: Secondary | ICD-10-CM | POA: Diagnosis not present

## 2022-06-25 DIAGNOSIS — Z79899 Other long term (current) drug therapy: Secondary | ICD-10-CM | POA: Diagnosis not present

## 2022-06-25 DIAGNOSIS — F411 Generalized anxiety disorder: Secondary | ICD-10-CM | POA: Diagnosis not present

## 2022-06-25 DIAGNOSIS — F330 Major depressive disorder, recurrent, mild: Secondary | ICD-10-CM | POA: Diagnosis not present

## 2022-06-25 DIAGNOSIS — F5101 Primary insomnia: Secondary | ICD-10-CM | POA: Diagnosis not present

## 2022-06-27 DIAGNOSIS — E119 Type 2 diabetes mellitus without complications: Secondary | ICD-10-CM | POA: Diagnosis not present

## 2022-06-27 DIAGNOSIS — F410 Panic disorder [episodic paroxysmal anxiety] without agoraphobia: Secondary | ICD-10-CM | POA: Diagnosis not present

## 2022-06-27 DIAGNOSIS — E782 Mixed hyperlipidemia: Secondary | ICD-10-CM | POA: Diagnosis not present

## 2022-06-27 DIAGNOSIS — K635 Polyp of colon: Secondary | ICD-10-CM | POA: Diagnosis not present

## 2022-06-27 DIAGNOSIS — E559 Vitamin D deficiency, unspecified: Secondary | ICD-10-CM | POA: Diagnosis not present

## 2022-06-27 DIAGNOSIS — I5032 Chronic diastolic (congestive) heart failure: Secondary | ICD-10-CM | POA: Diagnosis not present

## 2022-06-27 DIAGNOSIS — Z9981 Dependence on supplemental oxygen: Secondary | ICD-10-CM | POA: Diagnosis not present

## 2022-06-27 DIAGNOSIS — Z87891 Personal history of nicotine dependence: Secondary | ICD-10-CM | POA: Diagnosis not present

## 2022-06-27 DIAGNOSIS — Z794 Long term (current) use of insulin: Secondary | ICD-10-CM | POA: Diagnosis not present

## 2022-06-27 DIAGNOSIS — G4733 Obstructive sleep apnea (adult) (pediatric): Secondary | ICD-10-CM | POA: Diagnosis not present

## 2022-06-27 DIAGNOSIS — E039 Hypothyroidism, unspecified: Secondary | ICD-10-CM | POA: Diagnosis not present

## 2022-06-27 DIAGNOSIS — J8410 Pulmonary fibrosis, unspecified: Secondary | ICD-10-CM | POA: Diagnosis not present

## 2022-06-27 DIAGNOSIS — K219 Gastro-esophageal reflux disease without esophagitis: Secondary | ICD-10-CM | POA: Diagnosis not present

## 2022-06-27 DIAGNOSIS — J309 Allergic rhinitis, unspecified: Secondary | ICD-10-CM | POA: Diagnosis not present

## 2022-06-27 DIAGNOSIS — M8589 Other specified disorders of bone density and structure, multiple sites: Secondary | ICD-10-CM | POA: Diagnosis not present

## 2022-06-27 DIAGNOSIS — F329 Major depressive disorder, single episode, unspecified: Secondary | ICD-10-CM | POA: Diagnosis not present

## 2022-06-27 DIAGNOSIS — M545 Low back pain, unspecified: Secondary | ICD-10-CM | POA: Diagnosis not present

## 2022-06-27 DIAGNOSIS — I110 Hypertensive heart disease with heart failure: Secondary | ICD-10-CM | POA: Diagnosis not present

## 2022-06-27 DIAGNOSIS — K5521 Angiodysplasia of colon with hemorrhage: Secondary | ICD-10-CM | POA: Diagnosis not present

## 2022-06-27 DIAGNOSIS — K589 Irritable bowel syndrome without diarrhea: Secondary | ICD-10-CM | POA: Diagnosis not present

## 2022-06-27 DIAGNOSIS — F5104 Psychophysiologic insomnia: Secondary | ICD-10-CM | POA: Diagnosis not present

## 2022-06-27 DIAGNOSIS — J9611 Chronic respiratory failure with hypoxia: Secondary | ICD-10-CM | POA: Diagnosis not present

## 2022-06-27 DIAGNOSIS — J432 Centrilobular emphysema: Secondary | ICD-10-CM | POA: Diagnosis not present

## 2022-07-01 DIAGNOSIS — K219 Gastro-esophageal reflux disease without esophagitis: Secondary | ICD-10-CM | POA: Diagnosis not present

## 2022-07-01 DIAGNOSIS — Z87891 Personal history of nicotine dependence: Secondary | ICD-10-CM | POA: Diagnosis not present

## 2022-07-01 DIAGNOSIS — K5521 Angiodysplasia of colon with hemorrhage: Secondary | ICD-10-CM | POA: Diagnosis not present

## 2022-07-01 DIAGNOSIS — E039 Hypothyroidism, unspecified: Secondary | ICD-10-CM | POA: Diagnosis not present

## 2022-07-01 DIAGNOSIS — Z9981 Dependence on supplemental oxygen: Secondary | ICD-10-CM | POA: Diagnosis not present

## 2022-07-01 DIAGNOSIS — J9611 Chronic respiratory failure with hypoxia: Secondary | ICD-10-CM | POA: Diagnosis not present

## 2022-07-01 DIAGNOSIS — I5032 Chronic diastolic (congestive) heart failure: Secondary | ICD-10-CM | POA: Diagnosis not present

## 2022-07-01 DIAGNOSIS — J8410 Pulmonary fibrosis, unspecified: Secondary | ICD-10-CM | POA: Diagnosis not present

## 2022-07-01 DIAGNOSIS — E559 Vitamin D deficiency, unspecified: Secondary | ICD-10-CM | POA: Diagnosis not present

## 2022-07-01 DIAGNOSIS — J432 Centrilobular emphysema: Secondary | ICD-10-CM | POA: Diagnosis not present

## 2022-07-01 DIAGNOSIS — G4733 Obstructive sleep apnea (adult) (pediatric): Secondary | ICD-10-CM | POA: Diagnosis not present

## 2022-07-01 DIAGNOSIS — F329 Major depressive disorder, single episode, unspecified: Secondary | ICD-10-CM | POA: Diagnosis not present

## 2022-07-01 DIAGNOSIS — E119 Type 2 diabetes mellitus without complications: Secondary | ICD-10-CM | POA: Diagnosis not present

## 2022-07-01 DIAGNOSIS — I110 Hypertensive heart disease with heart failure: Secondary | ICD-10-CM | POA: Diagnosis not present

## 2022-07-01 DIAGNOSIS — K589 Irritable bowel syndrome without diarrhea: Secondary | ICD-10-CM | POA: Diagnosis not present

## 2022-07-01 DIAGNOSIS — J309 Allergic rhinitis, unspecified: Secondary | ICD-10-CM | POA: Diagnosis not present

## 2022-07-01 DIAGNOSIS — M545 Low back pain, unspecified: Secondary | ICD-10-CM | POA: Diagnosis not present

## 2022-07-01 DIAGNOSIS — E782 Mixed hyperlipidemia: Secondary | ICD-10-CM | POA: Diagnosis not present

## 2022-07-01 DIAGNOSIS — F5104 Psychophysiologic insomnia: Secondary | ICD-10-CM | POA: Diagnosis not present

## 2022-07-01 DIAGNOSIS — K635 Polyp of colon: Secondary | ICD-10-CM | POA: Diagnosis not present

## 2022-07-01 DIAGNOSIS — F410 Panic disorder [episodic paroxysmal anxiety] without agoraphobia: Secondary | ICD-10-CM | POA: Diagnosis not present

## 2022-07-01 DIAGNOSIS — M8589 Other specified disorders of bone density and structure, multiple sites: Secondary | ICD-10-CM | POA: Diagnosis not present

## 2022-07-01 DIAGNOSIS — Z794 Long term (current) use of insulin: Secondary | ICD-10-CM | POA: Diagnosis not present

## 2022-07-04 DIAGNOSIS — Z6841 Body Mass Index (BMI) 40.0 and over, adult: Secondary | ICD-10-CM

## 2022-07-04 DIAGNOSIS — J309 Allergic rhinitis, unspecified: Secondary | ICD-10-CM

## 2022-07-04 DIAGNOSIS — Z87891 Personal history of nicotine dependence: Secondary | ICD-10-CM

## 2022-07-04 DIAGNOSIS — M545 Low back pain, unspecified: Secondary | ICD-10-CM

## 2022-07-04 DIAGNOSIS — Z9981 Dependence on supplemental oxygen: Secondary | ICD-10-CM

## 2022-07-04 DIAGNOSIS — I5032 Chronic diastolic (congestive) heart failure: Secondary | ICD-10-CM

## 2022-07-04 DIAGNOSIS — G4733 Obstructive sleep apnea (adult) (pediatric): Secondary | ICD-10-CM

## 2022-07-04 DIAGNOSIS — K635 Polyp of colon: Secondary | ICD-10-CM

## 2022-07-04 DIAGNOSIS — F5104 Psychophysiologic insomnia: Secondary | ICD-10-CM

## 2022-07-04 DIAGNOSIS — F329 Major depressive disorder, single episode, unspecified: Secondary | ICD-10-CM

## 2022-07-04 DIAGNOSIS — E119 Type 2 diabetes mellitus without complications: Secondary | ICD-10-CM

## 2022-07-04 DIAGNOSIS — K589 Irritable bowel syndrome without diarrhea: Secondary | ICD-10-CM

## 2022-07-04 DIAGNOSIS — J432 Centrilobular emphysema: Secondary | ICD-10-CM | POA: Diagnosis not present

## 2022-07-04 DIAGNOSIS — Z794 Long term (current) use of insulin: Secondary | ICD-10-CM

## 2022-07-04 DIAGNOSIS — M8589 Other specified disorders of bone density and structure, multiple sites: Secondary | ICD-10-CM

## 2022-07-04 DIAGNOSIS — J8410 Pulmonary fibrosis, unspecified: Secondary | ICD-10-CM

## 2022-07-04 DIAGNOSIS — E559 Vitamin D deficiency, unspecified: Secondary | ICD-10-CM

## 2022-07-04 DIAGNOSIS — Z8616 Personal history of COVID-19: Secondary | ICD-10-CM

## 2022-07-04 DIAGNOSIS — Z7951 Long term (current) use of inhaled steroids: Secondary | ICD-10-CM

## 2022-07-04 DIAGNOSIS — E039 Hypothyroidism, unspecified: Secondary | ICD-10-CM

## 2022-07-04 DIAGNOSIS — I110 Hypertensive heart disease with heart failure: Secondary | ICD-10-CM

## 2022-07-04 DIAGNOSIS — K219 Gastro-esophageal reflux disease without esophagitis: Secondary | ICD-10-CM

## 2022-07-04 DIAGNOSIS — F410 Panic disorder [episodic paroxysmal anxiety] without agoraphobia: Secondary | ICD-10-CM

## 2022-07-04 DIAGNOSIS — K5521 Angiodysplasia of colon with hemorrhage: Secondary | ICD-10-CM

## 2022-07-04 DIAGNOSIS — J9611 Chronic respiratory failure with hypoxia: Secondary | ICD-10-CM

## 2022-07-04 DIAGNOSIS — E782 Mixed hyperlipidemia: Secondary | ICD-10-CM

## 2022-07-14 ENCOUNTER — Telehealth: Payer: Self-pay | Admitting: Family Medicine

## 2022-07-14 NOTE — Telephone Encounter (Signed)
PT 1x5

## 2022-07-15 NOTE — Telephone Encounter (Signed)
Please okay these orders  ?

## 2022-07-16 NOTE — Telephone Encounter (Signed)
Spoke with Angie to inform her of the approval for orders as below.  Angie also requested an order for OT also?  Message sent to PCP.

## 2022-07-17 NOTE — Telephone Encounter (Signed)
Spoke with Angie and informed her of the approval for OT as below.

## 2022-07-17 NOTE — Telephone Encounter (Signed)
Yes please order OT also

## 2022-07-18 DIAGNOSIS — Z9981 Dependence on supplemental oxygen: Secondary | ICD-10-CM | POA: Diagnosis not present

## 2022-07-18 DIAGNOSIS — F410 Panic disorder [episodic paroxysmal anxiety] without agoraphobia: Secondary | ICD-10-CM | POA: Diagnosis not present

## 2022-07-18 DIAGNOSIS — Z87891 Personal history of nicotine dependence: Secondary | ICD-10-CM | POA: Diagnosis not present

## 2022-07-18 DIAGNOSIS — Z794 Long term (current) use of insulin: Secondary | ICD-10-CM | POA: Diagnosis not present

## 2022-07-18 DIAGNOSIS — J9611 Chronic respiratory failure with hypoxia: Secondary | ICD-10-CM | POA: Diagnosis not present

## 2022-07-18 DIAGNOSIS — K5521 Angiodysplasia of colon with hemorrhage: Secondary | ICD-10-CM | POA: Diagnosis not present

## 2022-07-18 DIAGNOSIS — M545 Low back pain, unspecified: Secondary | ICD-10-CM | POA: Diagnosis not present

## 2022-07-18 DIAGNOSIS — I5032 Chronic diastolic (congestive) heart failure: Secondary | ICD-10-CM | POA: Diagnosis not present

## 2022-07-18 DIAGNOSIS — I110 Hypertensive heart disease with heart failure: Secondary | ICD-10-CM | POA: Diagnosis not present

## 2022-07-18 DIAGNOSIS — G4733 Obstructive sleep apnea (adult) (pediatric): Secondary | ICD-10-CM | POA: Diagnosis not present

## 2022-07-18 DIAGNOSIS — J8410 Pulmonary fibrosis, unspecified: Secondary | ICD-10-CM | POA: Diagnosis not present

## 2022-07-18 DIAGNOSIS — E039 Hypothyroidism, unspecified: Secondary | ICD-10-CM | POA: Diagnosis not present

## 2022-07-18 DIAGNOSIS — F329 Major depressive disorder, single episode, unspecified: Secondary | ICD-10-CM | POA: Diagnosis not present

## 2022-07-18 DIAGNOSIS — E559 Vitamin D deficiency, unspecified: Secondary | ICD-10-CM | POA: Diagnosis not present

## 2022-07-18 DIAGNOSIS — M8589 Other specified disorders of bone density and structure, multiple sites: Secondary | ICD-10-CM | POA: Diagnosis not present

## 2022-07-18 DIAGNOSIS — J309 Allergic rhinitis, unspecified: Secondary | ICD-10-CM | POA: Diagnosis not present

## 2022-07-18 DIAGNOSIS — K219 Gastro-esophageal reflux disease without esophagitis: Secondary | ICD-10-CM | POA: Diagnosis not present

## 2022-07-18 DIAGNOSIS — K589 Irritable bowel syndrome without diarrhea: Secondary | ICD-10-CM | POA: Diagnosis not present

## 2022-07-18 DIAGNOSIS — K635 Polyp of colon: Secondary | ICD-10-CM | POA: Diagnosis not present

## 2022-07-18 DIAGNOSIS — J432 Centrilobular emphysema: Secondary | ICD-10-CM | POA: Diagnosis not present

## 2022-07-18 DIAGNOSIS — F5104 Psychophysiologic insomnia: Secondary | ICD-10-CM | POA: Diagnosis not present

## 2022-07-18 DIAGNOSIS — E782 Mixed hyperlipidemia: Secondary | ICD-10-CM | POA: Diagnosis not present

## 2022-07-18 DIAGNOSIS — E119 Type 2 diabetes mellitus without complications: Secondary | ICD-10-CM | POA: Diagnosis not present

## 2022-07-21 DIAGNOSIS — K589 Irritable bowel syndrome without diarrhea: Secondary | ICD-10-CM | POA: Diagnosis not present

## 2022-07-21 DIAGNOSIS — I110 Hypertensive heart disease with heart failure: Secondary | ICD-10-CM | POA: Diagnosis not present

## 2022-07-21 DIAGNOSIS — J449 Chronic obstructive pulmonary disease, unspecified: Secondary | ICD-10-CM | POA: Diagnosis not present

## 2022-07-21 DIAGNOSIS — Z794 Long term (current) use of insulin: Secondary | ICD-10-CM | POA: Diagnosis not present

## 2022-07-21 DIAGNOSIS — E119 Type 2 diabetes mellitus without complications: Secondary | ICD-10-CM | POA: Diagnosis not present

## 2022-07-21 DIAGNOSIS — I5032 Chronic diastolic (congestive) heart failure: Secondary | ICD-10-CM | POA: Diagnosis not present

## 2022-07-21 DIAGNOSIS — E559 Vitamin D deficiency, unspecified: Secondary | ICD-10-CM | POA: Diagnosis not present

## 2022-07-21 DIAGNOSIS — F5104 Psychophysiologic insomnia: Secondary | ICD-10-CM | POA: Diagnosis not present

## 2022-07-21 DIAGNOSIS — K219 Gastro-esophageal reflux disease without esophagitis: Secondary | ICD-10-CM | POA: Diagnosis not present

## 2022-07-21 DIAGNOSIS — F410 Panic disorder [episodic paroxysmal anxiety] without agoraphobia: Secondary | ICD-10-CM | POA: Diagnosis not present

## 2022-07-21 DIAGNOSIS — M545 Low back pain, unspecified: Secondary | ICD-10-CM | POA: Diagnosis not present

## 2022-07-21 DIAGNOSIS — K5521 Angiodysplasia of colon with hemorrhage: Secondary | ICD-10-CM | POA: Diagnosis not present

## 2022-07-21 DIAGNOSIS — J8410 Pulmonary fibrosis, unspecified: Secondary | ICD-10-CM | POA: Diagnosis not present

## 2022-07-21 DIAGNOSIS — F329 Major depressive disorder, single episode, unspecified: Secondary | ICD-10-CM | POA: Diagnosis not present

## 2022-07-21 DIAGNOSIS — K635 Polyp of colon: Secondary | ICD-10-CM | POA: Diagnosis not present

## 2022-07-21 DIAGNOSIS — G4733 Obstructive sleep apnea (adult) (pediatric): Secondary | ICD-10-CM | POA: Diagnosis not present

## 2022-07-21 DIAGNOSIS — J9611 Chronic respiratory failure with hypoxia: Secondary | ICD-10-CM | POA: Diagnosis not present

## 2022-07-21 DIAGNOSIS — Z87891 Personal history of nicotine dependence: Secondary | ICD-10-CM | POA: Diagnosis not present

## 2022-07-21 DIAGNOSIS — Z9981 Dependence on supplemental oxygen: Secondary | ICD-10-CM | POA: Diagnosis not present

## 2022-07-21 DIAGNOSIS — M8589 Other specified disorders of bone density and structure, multiple sites: Secondary | ICD-10-CM | POA: Diagnosis not present

## 2022-07-21 DIAGNOSIS — J432 Centrilobular emphysema: Secondary | ICD-10-CM | POA: Diagnosis not present

## 2022-07-21 DIAGNOSIS — E039 Hypothyroidism, unspecified: Secondary | ICD-10-CM | POA: Diagnosis not present

## 2022-07-21 DIAGNOSIS — E782 Mixed hyperlipidemia: Secondary | ICD-10-CM | POA: Diagnosis not present

## 2022-07-21 DIAGNOSIS — J309 Allergic rhinitis, unspecified: Secondary | ICD-10-CM | POA: Diagnosis not present

## 2022-07-23 DIAGNOSIS — G4733 Obstructive sleep apnea (adult) (pediatric): Secondary | ICD-10-CM | POA: Diagnosis not present

## 2022-07-24 DIAGNOSIS — J449 Chronic obstructive pulmonary disease, unspecified: Secondary | ICD-10-CM | POA: Diagnosis not present

## 2022-07-24 DIAGNOSIS — J432 Centrilobular emphysema: Secondary | ICD-10-CM | POA: Diagnosis not present

## 2022-07-24 DIAGNOSIS — J9611 Chronic respiratory failure with hypoxia: Secondary | ICD-10-CM | POA: Diagnosis not present

## 2022-07-25 ENCOUNTER — Telehealth: Payer: Self-pay | Admitting: Family Medicine

## 2022-07-25 ENCOUNTER — Other Ambulatory Visit: Payer: Self-pay

## 2022-07-25 ENCOUNTER — Encounter: Payer: Self-pay | Admitting: Family Medicine

## 2022-07-25 MED ORDER — AZELASTINE HCL 0.1 % NA SOLN: 1.0000 | Freq: Two times a day (BID) | NASAL | 1 refills | Status: AC | PRN

## 2022-07-25 NOTE — Telephone Encounter (Signed)
Error/njr °

## 2022-07-25 NOTE — Telephone Encounter (Signed)
Spoke with Marlowe Kays with [facility] advised that verbal orders requested have been approved

## 2022-07-25 NOTE — Telephone Encounter (Signed)
Marlowe Kays from [facility] call and stated she need a verbal order for OT once a wk for [facility]

## 2022-07-30 DIAGNOSIS — M8589 Other specified disorders of bone density and structure, multiple sites: Secondary | ICD-10-CM | POA: Diagnosis not present

## 2022-07-30 DIAGNOSIS — M545 Low back pain, unspecified: Secondary | ICD-10-CM | POA: Diagnosis not present

## 2022-07-30 DIAGNOSIS — E782 Mixed hyperlipidemia: Secondary | ICD-10-CM | POA: Diagnosis not present

## 2022-07-30 DIAGNOSIS — E119 Type 2 diabetes mellitus without complications: Secondary | ICD-10-CM | POA: Diagnosis not present

## 2022-07-30 DIAGNOSIS — I110 Hypertensive heart disease with heart failure: Secondary | ICD-10-CM | POA: Diagnosis not present

## 2022-07-30 DIAGNOSIS — F410 Panic disorder [episodic paroxysmal anxiety] without agoraphobia: Secondary | ICD-10-CM | POA: Diagnosis not present

## 2022-07-30 DIAGNOSIS — K589 Irritable bowel syndrome without diarrhea: Secondary | ICD-10-CM | POA: Diagnosis not present

## 2022-07-30 DIAGNOSIS — F5104 Psychophysiologic insomnia: Secondary | ICD-10-CM | POA: Diagnosis not present

## 2022-07-30 DIAGNOSIS — Z794 Long term (current) use of insulin: Secondary | ICD-10-CM | POA: Diagnosis not present

## 2022-07-30 DIAGNOSIS — K5521 Angiodysplasia of colon with hemorrhage: Secondary | ICD-10-CM | POA: Diagnosis not present

## 2022-07-30 DIAGNOSIS — I5032 Chronic diastolic (congestive) heart failure: Secondary | ICD-10-CM | POA: Diagnosis not present

## 2022-07-30 DIAGNOSIS — J9611 Chronic respiratory failure with hypoxia: Secondary | ICD-10-CM | POA: Diagnosis not present

## 2022-07-30 DIAGNOSIS — K635 Polyp of colon: Secondary | ICD-10-CM | POA: Diagnosis not present

## 2022-07-30 DIAGNOSIS — Z9981 Dependence on supplemental oxygen: Secondary | ICD-10-CM | POA: Diagnosis not present

## 2022-07-30 DIAGNOSIS — J309 Allergic rhinitis, unspecified: Secondary | ICD-10-CM | POA: Diagnosis not present

## 2022-07-30 DIAGNOSIS — E039 Hypothyroidism, unspecified: Secondary | ICD-10-CM | POA: Diagnosis not present

## 2022-07-30 DIAGNOSIS — J432 Centrilobular emphysema: Secondary | ICD-10-CM | POA: Diagnosis not present

## 2022-07-30 DIAGNOSIS — Z87891 Personal history of nicotine dependence: Secondary | ICD-10-CM | POA: Diagnosis not present

## 2022-07-30 DIAGNOSIS — E559 Vitamin D deficiency, unspecified: Secondary | ICD-10-CM | POA: Diagnosis not present

## 2022-07-30 DIAGNOSIS — K219 Gastro-esophageal reflux disease without esophagitis: Secondary | ICD-10-CM | POA: Diagnosis not present

## 2022-07-30 DIAGNOSIS — G4733 Obstructive sleep apnea (adult) (pediatric): Secondary | ICD-10-CM | POA: Diagnosis not present

## 2022-07-30 DIAGNOSIS — J8410 Pulmonary fibrosis, unspecified: Secondary | ICD-10-CM | POA: Diagnosis not present

## 2022-07-30 DIAGNOSIS — F329 Major depressive disorder, single episode, unspecified: Secondary | ICD-10-CM | POA: Diagnosis not present

## 2022-08-01 DIAGNOSIS — E782 Mixed hyperlipidemia: Secondary | ICD-10-CM | POA: Diagnosis not present

## 2022-08-01 DIAGNOSIS — J432 Centrilobular emphysema: Secondary | ICD-10-CM | POA: Diagnosis not present

## 2022-08-01 DIAGNOSIS — Z87891 Personal history of nicotine dependence: Secondary | ICD-10-CM | POA: Diagnosis not present

## 2022-08-01 DIAGNOSIS — E559 Vitamin D deficiency, unspecified: Secondary | ICD-10-CM | POA: Diagnosis not present

## 2022-08-01 DIAGNOSIS — I5032 Chronic diastolic (congestive) heart failure: Secondary | ICD-10-CM | POA: Diagnosis not present

## 2022-08-01 DIAGNOSIS — I110 Hypertensive heart disease with heart failure: Secondary | ICD-10-CM | POA: Diagnosis not present

## 2022-08-01 DIAGNOSIS — J9611 Chronic respiratory failure with hypoxia: Secondary | ICD-10-CM | POA: Diagnosis not present

## 2022-08-01 DIAGNOSIS — M8589 Other specified disorders of bone density and structure, multiple sites: Secondary | ICD-10-CM | POA: Diagnosis not present

## 2022-08-01 DIAGNOSIS — F5104 Psychophysiologic insomnia: Secondary | ICD-10-CM | POA: Diagnosis not present

## 2022-08-01 DIAGNOSIS — K219 Gastro-esophageal reflux disease without esophagitis: Secondary | ICD-10-CM | POA: Diagnosis not present

## 2022-08-01 DIAGNOSIS — Z794 Long term (current) use of insulin: Secondary | ICD-10-CM | POA: Diagnosis not present

## 2022-08-01 DIAGNOSIS — K635 Polyp of colon: Secondary | ICD-10-CM | POA: Diagnosis not present

## 2022-08-01 DIAGNOSIS — F329 Major depressive disorder, single episode, unspecified: Secondary | ICD-10-CM | POA: Diagnosis not present

## 2022-08-01 DIAGNOSIS — F410 Panic disorder [episodic paroxysmal anxiety] without agoraphobia: Secondary | ICD-10-CM | POA: Diagnosis not present

## 2022-08-01 DIAGNOSIS — E039 Hypothyroidism, unspecified: Secondary | ICD-10-CM | POA: Diagnosis not present

## 2022-08-01 DIAGNOSIS — G4733 Obstructive sleep apnea (adult) (pediatric): Secondary | ICD-10-CM | POA: Diagnosis not present

## 2022-08-01 DIAGNOSIS — K589 Irritable bowel syndrome without diarrhea: Secondary | ICD-10-CM | POA: Diagnosis not present

## 2022-08-01 DIAGNOSIS — Z9981 Dependence on supplemental oxygen: Secondary | ICD-10-CM | POA: Diagnosis not present

## 2022-08-01 DIAGNOSIS — J309 Allergic rhinitis, unspecified: Secondary | ICD-10-CM | POA: Diagnosis not present

## 2022-08-01 DIAGNOSIS — E119 Type 2 diabetes mellitus without complications: Secondary | ICD-10-CM | POA: Diagnosis not present

## 2022-08-01 DIAGNOSIS — M545 Low back pain, unspecified: Secondary | ICD-10-CM | POA: Diagnosis not present

## 2022-08-01 DIAGNOSIS — K5521 Angiodysplasia of colon with hemorrhage: Secondary | ICD-10-CM | POA: Diagnosis not present

## 2022-08-01 DIAGNOSIS — J8410 Pulmonary fibrosis, unspecified: Secondary | ICD-10-CM | POA: Diagnosis not present

## 2022-08-07 ENCOUNTER — Encounter: Payer: Self-pay | Admitting: Gastroenterology

## 2022-08-13 ENCOUNTER — Encounter: Payer: Self-pay | Admitting: Gastroenterology

## 2022-08-15 ENCOUNTER — Telehealth: Payer: Self-pay | Admitting: Family Medicine

## 2022-08-15 NOTE — Telephone Encounter (Signed)
Please okay these orders  ?

## 2022-08-15 NOTE — Telephone Encounter (Signed)
Bonnie Gonzalez from [facility]  Verbal Orders:  Requesting extension of O.T.   Once a week for 8 weeks.  Okay to leave a detailed message.

## 2022-08-18 ENCOUNTER — Other Ambulatory Visit: Payer: Self-pay | Admitting: Family Medicine

## 2022-08-18 DIAGNOSIS — J309 Allergic rhinitis, unspecified: Secondary | ICD-10-CM

## 2022-08-18 NOTE — Telephone Encounter (Signed)
Left detailed message for Marlowe Kays with [facility], advise that VO orders requested have been approved by Dr Sarajane Jews

## 2022-08-21 DIAGNOSIS — G4733 Obstructive sleep apnea (adult) (pediatric): Secondary | ICD-10-CM

## 2022-08-21 DIAGNOSIS — E039 Hypothyroidism, unspecified: Secondary | ICD-10-CM

## 2022-08-21 DIAGNOSIS — F5104 Psychophysiologic insomnia: Secondary | ICD-10-CM

## 2022-08-21 DIAGNOSIS — I110 Hypertensive heart disease with heart failure: Secondary | ICD-10-CM

## 2022-08-21 DIAGNOSIS — Z794 Long term (current) use of insulin: Secondary | ICD-10-CM

## 2022-08-21 DIAGNOSIS — Z8616 Personal history of COVID-19: Secondary | ICD-10-CM

## 2022-08-21 DIAGNOSIS — M8589 Other specified disorders of bone density and structure, multiple sites: Secondary | ICD-10-CM

## 2022-08-21 DIAGNOSIS — F329 Major depressive disorder, single episode, unspecified: Secondary | ICD-10-CM

## 2022-08-21 DIAGNOSIS — E782 Mixed hyperlipidemia: Secondary | ICD-10-CM

## 2022-08-21 DIAGNOSIS — Z9981 Dependence on supplemental oxygen: Secondary | ICD-10-CM

## 2022-08-21 DIAGNOSIS — K635 Polyp of colon: Secondary | ICD-10-CM

## 2022-08-21 DIAGNOSIS — K589 Irritable bowel syndrome without diarrhea: Secondary | ICD-10-CM

## 2022-08-21 DIAGNOSIS — M545 Low back pain, unspecified: Secondary | ICD-10-CM

## 2022-08-21 DIAGNOSIS — J449 Chronic obstructive pulmonary disease, unspecified: Secondary | ICD-10-CM | POA: Diagnosis not present

## 2022-08-21 DIAGNOSIS — J9611 Chronic respiratory failure with hypoxia: Secondary | ICD-10-CM

## 2022-08-21 DIAGNOSIS — E559 Vitamin D deficiency, unspecified: Secondary | ICD-10-CM

## 2022-08-21 DIAGNOSIS — J309 Allergic rhinitis, unspecified: Secondary | ICD-10-CM

## 2022-08-21 DIAGNOSIS — F410 Panic disorder [episodic paroxysmal anxiety] without agoraphobia: Secondary | ICD-10-CM

## 2022-08-21 DIAGNOSIS — J432 Centrilobular emphysema: Secondary | ICD-10-CM

## 2022-08-21 DIAGNOSIS — I5032 Chronic diastolic (congestive) heart failure: Secondary | ICD-10-CM

## 2022-08-21 DIAGNOSIS — K5521 Angiodysplasia of colon with hemorrhage: Secondary | ICD-10-CM

## 2022-08-21 DIAGNOSIS — Z6841 Body Mass Index (BMI) 40.0 and over, adult: Secondary | ICD-10-CM

## 2022-08-21 DIAGNOSIS — J8410 Pulmonary fibrosis, unspecified: Secondary | ICD-10-CM

## 2022-08-21 DIAGNOSIS — Z87891 Personal history of nicotine dependence: Secondary | ICD-10-CM

## 2022-08-21 DIAGNOSIS — E119 Type 2 diabetes mellitus without complications: Secondary | ICD-10-CM

## 2022-08-21 DIAGNOSIS — K219 Gastro-esophageal reflux disease without esophagitis: Secondary | ICD-10-CM

## 2022-08-21 DIAGNOSIS — Z7951 Long term (current) use of inhaled steroids: Secondary | ICD-10-CM

## 2022-08-27 NOTE — Telephone Encounter (Signed)
ERROR

## 2022-09-05 DIAGNOSIS — E119 Type 2 diabetes mellitus without complications: Secondary | ICD-10-CM | POA: Diagnosis not present

## 2022-09-05 DIAGNOSIS — Z87891 Personal history of nicotine dependence: Secondary | ICD-10-CM | POA: Diagnosis not present

## 2022-09-05 DIAGNOSIS — J9611 Chronic respiratory failure with hypoxia: Secondary | ICD-10-CM | POA: Diagnosis not present

## 2022-09-05 DIAGNOSIS — K589 Irritable bowel syndrome without diarrhea: Secondary | ICD-10-CM | POA: Diagnosis not present

## 2022-09-05 DIAGNOSIS — Z9981 Dependence on supplemental oxygen: Secondary | ICD-10-CM | POA: Diagnosis not present

## 2022-09-05 DIAGNOSIS — F5104 Psychophysiologic insomnia: Secondary | ICD-10-CM | POA: Diagnosis not present

## 2022-09-05 DIAGNOSIS — J432 Centrilobular emphysema: Secondary | ICD-10-CM | POA: Diagnosis not present

## 2022-09-05 DIAGNOSIS — K635 Polyp of colon: Secondary | ICD-10-CM | POA: Diagnosis not present

## 2022-09-05 DIAGNOSIS — E039 Hypothyroidism, unspecified: Secondary | ICD-10-CM | POA: Diagnosis not present

## 2022-09-05 DIAGNOSIS — E782 Mixed hyperlipidemia: Secondary | ICD-10-CM | POA: Diagnosis not present

## 2022-09-05 DIAGNOSIS — J309 Allergic rhinitis, unspecified: Secondary | ICD-10-CM | POA: Diagnosis not present

## 2022-09-05 DIAGNOSIS — K219 Gastro-esophageal reflux disease without esophagitis: Secondary | ICD-10-CM | POA: Diagnosis not present

## 2022-09-05 DIAGNOSIS — M545 Low back pain, unspecified: Secondary | ICD-10-CM | POA: Diagnosis not present

## 2022-09-05 DIAGNOSIS — I5032 Chronic diastolic (congestive) heart failure: Secondary | ICD-10-CM | POA: Diagnosis not present

## 2022-09-05 DIAGNOSIS — F410 Panic disorder [episodic paroxysmal anxiety] without agoraphobia: Secondary | ICD-10-CM | POA: Diagnosis not present

## 2022-09-05 DIAGNOSIS — G4733 Obstructive sleep apnea (adult) (pediatric): Secondary | ICD-10-CM | POA: Diagnosis not present

## 2022-09-05 DIAGNOSIS — K5521 Angiodysplasia of colon with hemorrhage: Secondary | ICD-10-CM | POA: Diagnosis not present

## 2022-09-05 DIAGNOSIS — Z794 Long term (current) use of insulin: Secondary | ICD-10-CM | POA: Diagnosis not present

## 2022-09-05 DIAGNOSIS — E559 Vitamin D deficiency, unspecified: Secondary | ICD-10-CM | POA: Diagnosis not present

## 2022-09-05 DIAGNOSIS — F329 Major depressive disorder, single episode, unspecified: Secondary | ICD-10-CM | POA: Diagnosis not present

## 2022-09-05 DIAGNOSIS — J8410 Pulmonary fibrosis, unspecified: Secondary | ICD-10-CM | POA: Diagnosis not present

## 2022-09-05 DIAGNOSIS — I110 Hypertensive heart disease with heart failure: Secondary | ICD-10-CM | POA: Diagnosis not present

## 2022-09-05 DIAGNOSIS — M8589 Other specified disorders of bone density and structure, multiple sites: Secondary | ICD-10-CM | POA: Diagnosis not present

## 2022-09-16 ENCOUNTER — Other Ambulatory Visit: Payer: Self-pay | Admitting: Family Medicine

## 2022-11-07 ENCOUNTER — Telehealth: Payer: Self-pay | Admitting: Family Medicine

## 2022-11-07 NOTE — Telephone Encounter (Signed)
Pt is calling and has had the wheelchair since July and need reauthorization . Pt got wheelchair from [facility]  billing phone number [phone]

## 2022-11-11 ENCOUNTER — Ambulatory Visit (INDEPENDENT_AMBULATORY_CARE_PROVIDER_SITE_OTHER): Payer: PPO | Admitting: Family Medicine

## 2022-11-11 ENCOUNTER — Encounter: Payer: Self-pay | Admitting: Family Medicine

## 2022-11-11 VITALS — BP 136/70 | HR 74 | Temp 97.9°F | Wt 252.0 lb

## 2022-11-11 DIAGNOSIS — I5032 Chronic diastolic (congestive) heart failure: Secondary | ICD-10-CM

## 2022-11-11 DIAGNOSIS — R200 Anesthesia of skin: Secondary | ICD-10-CM

## 2022-11-11 DIAGNOSIS — J432 Centrilobular emphysema: Secondary | ICD-10-CM | POA: Diagnosis not present

## 2022-11-11 DIAGNOSIS — B351 Tinea unguium: Secondary | ICD-10-CM

## 2022-11-11 DIAGNOSIS — J439 Emphysema, unspecified: Secondary | ICD-10-CM

## 2022-11-11 MED ORDER — TERBINAFINE HCL 250 MG PO TABS: 250.0000 mg | ORAL_TABLET | Freq: Every day | ORAL | 1 refills | Status: DC

## 2022-11-11 NOTE — Telephone Encounter (Signed)
I contacted number listed below and was informed to fax today's OV note which to include why patient has need for wheelchair and how wheelchair is beneficial for the patient.    Once complete please fax OV note to include patient's name and DOB on cover sheet to [phone] to [facility]

## 2022-11-11 NOTE — Telephone Encounter (Signed)
I included

## 2022-11-11 NOTE — Telephone Encounter (Signed)
I included such language in my OV note today. Please fax this as requested

## 2022-11-11 NOTE — Progress Notes (Addendum)
   Subjective:    Patient ID: Bonnie Gonzalez, female    DOB: Aug 29, 1956, 66 y.o.   MRN: [id number]  HPI Here for 2 issues. First about 3 months ago she began to experience numbness and tingling in both hands. There is no pain or swelling. This comes and goes, but certain activities like driving her car makes this worse. She does not have this in her feet. The other issue is fungal infection in her fingernails. This showed up about a year ago. She has tried several topical products on them with no results. Of note her COPD and her chronic diastolic CHF are stable, but they prohibit her from standing more than one minute or from walking more than 10 feet. She is dependent on her wheelchair to get around and to have any quality of life.    Review of Systems  Constitutional: Negative.   Respiratory: Negative.    Cardiovascular: Negative.   Neurological:  Positive for numbness. Negative for weakness.       Objective:   Physical Exam Constitutional:      General: She is not in acute distress.    Comments: Seated in her wheelchair   Cardiovascular:     Rate and Rhythm: Normal rate and regular rhythm.     Pulses: Normal pulses.     Heart sounds: Normal heart sounds.  Pulmonary:     Effort: Pulmonary effort is normal.     Breath sounds: Normal breath sounds.  Musculoskeletal:     Comments: Both hands have no swelling or tenderness   Skin:    Comments: All fingernails show fungal involvement, they are thickened and crusty   Neurological:     Mental Status: She is alert and oriented to person, place, and time.           Assessment & Plan:  The hand numbness is likely from carpal tunnel syndrome. I recommended she wear splints on both wrists 24 hours a day to immobilize them. If this persists after 4 weeks of wearing the splints, we will send her for a nerve conduction study. We will treat the onychomycosis with 3-6 months of daily Terbinafine. Her COPD and CHF are stable but  debilitating.  [name], MD

## 2022-11-14 NOTE — Telephone Encounter (Signed)
Pt information has been faxed as requested, confirmation received

## 2022-11-30 ENCOUNTER — Other Ambulatory Visit: Payer: Self-pay | Admitting: Family Medicine

## 2022-12-02 NOTE — Telephone Encounter (Signed)
Pt LOV was on 10/31/21 Last refill was done on 11/11/22 Please advise

## 2022-12-05 ENCOUNTER — Encounter: Payer: Self-pay | Admitting: Family Medicine

## 2022-12-05 ENCOUNTER — Ambulatory Visit (INDEPENDENT_AMBULATORY_CARE_PROVIDER_SITE_OTHER): Payer: PPO | Admitting: Family Medicine

## 2022-12-05 VITALS — BP 118/72 | HR 72 | Temp 98.0°F | Wt 265.0 lb

## 2022-12-05 DIAGNOSIS — J9611 Chronic respiratory failure with hypoxia: Secondary | ICD-10-CM | POA: Diagnosis not present

## 2022-12-05 DIAGNOSIS — J329 Chronic sinusitis, unspecified: Secondary | ICD-10-CM | POA: Diagnosis not present

## 2022-12-05 DIAGNOSIS — J209 Acute bronchitis, unspecified: Secondary | ICD-10-CM | POA: Diagnosis not present

## 2022-12-05 DIAGNOSIS — J440 Chronic obstructive pulmonary disease with acute lower respiratory infection: Secondary | ICD-10-CM

## 2022-12-05 MED ORDER — LEVOFLOXACIN 500 MG PO TABS: 500.0000 mg | ORAL_TABLET | Freq: Every day | ORAL | 0 refills | Status: AC

## 2022-12-05 NOTE — Progress Notes (Signed)
   Subjective:    Patient ID: Bonnie Gonzalez, female    DOB: 01/26/1956, 67 y.o.   MRN: [id number]  HPI Here for 10 days of chest congestion and coughing up green sputum. She is always SOB from her COPD, but she denies any chest pain.    Review of Systems  Constitutional: Negative.   HENT:  Positive for congestion and postnasal drip. Negative for ear pain, sinus pressure and sore throat.   Eyes: Negative.   Respiratory:  Positive for cough and shortness of breath. Negative for wheezing.        Objective:   Physical Exam Constitutional:      Appearance: She is not ill-appearing.     Comments: Using her walker and wearing [place] oxygen   HENT:     Right Ear: Tympanic membrane, ear canal and external ear normal.     Left Ear: Tympanic membrane, ear canal and external ear normal.     Nose: Nose normal.     Mouth/Throat:     Pharynx: Oropharynx is clear.  Eyes:     Conjunctiva/sclera: Conjunctivae normal.  Pulmonary:     Breath sounds: Rhonchi present. No wheezing or rales.  Lymphadenopathy:     Cervical: No cervical adenopathy.  Neurological:     Mental Status: She is alert.           Assessment & Plan:  Bronchitis, treat with 10 days of Levaquin.  [name], MD

## 2022-12-10 ENCOUNTER — Telehealth: Payer: Self-pay | Admitting: Gastroenterology

## 2022-12-10 NOTE — Telephone Encounter (Signed)
Inbound call from patient stating that ever since she has had her colonoscopy she has been having issues using the bathroom. Patient stated that she wanted to see if she could discuss with the nurse. Please advise.

## 2022-12-11 ENCOUNTER — Other Ambulatory Visit: Payer: Self-pay

## 2022-12-11 ENCOUNTER — Telehealth: Payer: Self-pay | Admitting: Family Medicine

## 2022-12-11 MED ORDER — LINACLOTIDE 145 MCG PO CAPS: 145.0000 ug | ORAL_CAPSULE | Freq: Every day | ORAL | 3 refills | Status: DC

## 2022-12-11 NOTE — Telephone Encounter (Signed)
Patient advised of the recommendations. Linzess prescription to [facility]. Patient understands that insurance may refuse or may require prior authorization.

## 2022-12-11 NOTE — Telephone Encounter (Signed)
Patient calls with complaints of chronic constipation. This began worsening after her colonoscopy in Mische[place] 2023. She made dietary changes to include more "roughage." She increased her fluids. She began eating prunes. She is now taking OTC "gentle" laxative up to 3 times a week. Drinks magnesium citrate 1/2 a bottle two days in a row. She feels she is not getting a good results and continues to pass hard stool. Does not feel empties. Has bloating daily . Some days she is nauseated. Scheduled the patient for an appointment. 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 Finding an appointment is complicated. She is scheduled for 01/06/23. What do you advise for her in the interim?

## 2022-12-11 NOTE — Telephone Encounter (Signed)
Please send prescription for Linzess 145 mcg daily, advised her to take 1 capsule daily before breakfast.  Continue with high-fiber diet and increase water intake.  She can hold taking her other laxatives once she starts Linzess.  I will discuss further during follow-up visit.  Thanks

## 2022-12-11 NOTE — Telephone Encounter (Signed)
Misty from [facility] called to confirm receipt of fax sent on 12/08/22.  Please call  [phone]

## 2022-12-12 ENCOUNTER — Other Ambulatory Visit: Payer: Self-pay | Admitting: Family Medicine

## 2022-12-12 DIAGNOSIS — H2511 Age-related nuclear cataract, right eye: Secondary | ICD-10-CM | POA: Diagnosis not present

## 2022-12-12 DIAGNOSIS — H25811 Combined forms of age-related cataract, right eye: Secondary | ICD-10-CM | POA: Diagnosis not present

## 2022-12-12 NOTE — Telephone Encounter (Signed)
Called and left message with staff to have Misty to [facility].

## 2022-12-14 ENCOUNTER — Other Ambulatory Visit: Payer: Self-pay | Admitting: Family Medicine

## 2022-12-14 DIAGNOSIS — J309 Allergic rhinitis, unspecified: Secondary | ICD-10-CM

## 2022-12-15 ENCOUNTER — Ambulatory Visit: Payer: PPO | Admitting: Physician Assistant

## 2022-12-16 ENCOUNTER — Telehealth: Payer: Self-pay | Admitting: Family Medicine

## 2022-12-16 ENCOUNTER — Other Ambulatory Visit: Payer: Self-pay

## 2022-12-16 DIAGNOSIS — J309 Allergic rhinitis, unspecified: Secondary | ICD-10-CM

## 2022-12-16 MED ORDER — HYDROXYZINE PAMOATE 25 MG PO CAPS: ORAL_CAPSULE | ORAL | 1 refills | Status: DC

## 2022-12-16 NOTE — Telephone Encounter (Addendum)
Pt called to request the PA and/or refill of the following:  hydrOXYzine (VISTARIL) 25 MG capsule   LOV:  12/05/22  Please advise.  [place], [address] Phone: [phone]  Fax: [phone]

## 2022-12-16 NOTE — Telephone Encounter (Signed)
Rx filled

## 2022-12-21 ENCOUNTER — Other Ambulatory Visit: Payer: Self-pay | Admitting: Cardiology

## 2022-12-21 DIAGNOSIS — J449 Chronic obstructive pulmonary disease, unspecified: Secondary | ICD-10-CM | POA: Diagnosis not present

## 2022-12-22 ENCOUNTER — Other Ambulatory Visit: Payer: Self-pay

## 2022-12-22 MED ORDER — ESOMEPRAZOLE MAGNESIUM 20 MG PO CPDR: 20.0000 mg | DELAYED_RELEASE_CAPSULE | Freq: Every day | ORAL | 0 refills | Status: DC

## 2022-12-22 NOTE — Telephone Encounter (Signed)
Misty called back to check on the status of the [facility] fax. Let Misty know that someone called and LVM with her and she stated she normally don't get those until later. Would like to see if someone can give her a call back.   Call: [phone]  Please advise.

## 2022-12-22 NOTE — Telephone Encounter (Signed)
Spoke with Misty from [facility], requesting on  behalf of pt to  send in Rx for Nexium to pt pharmacy. Stated that pt was taking OTC which was expensive. Rx sent and pt notified

## 2022-12-26 DIAGNOSIS — H2512 Age-related nuclear cataract, left eye: Secondary | ICD-10-CM | POA: Diagnosis not present

## 2022-12-27 ENCOUNTER — Other Ambulatory Visit: Payer: Self-pay | Admitting: Family Medicine

## 2022-12-29 NOTE — Telephone Encounter (Signed)
Pt LOV was on 12/05/2022 Last refill done on 05/28/22 by Historical provider, please advise if ok to send refills

## 2022-12-30 DIAGNOSIS — H2512 Age-related nuclear cataract, left eye: Secondary | ICD-10-CM | POA: Diagnosis not present

## 2022-12-30 DIAGNOSIS — H25812 Combined forms of age-related cataract, left eye: Secondary | ICD-10-CM | POA: Diagnosis not present

## 2023-01-05 DIAGNOSIS — F5101 Primary insomnia: Secondary | ICD-10-CM | POA: Diagnosis not present

## 2023-01-05 DIAGNOSIS — F411 Generalized anxiety disorder: Secondary | ICD-10-CM | POA: Diagnosis not present

## 2023-01-05 DIAGNOSIS — Z79899 Other long term (current) drug therapy: Secondary | ICD-10-CM | POA: Diagnosis not present

## 2023-01-05 DIAGNOSIS — F330 Major depressive disorder, recurrent, mild: Secondary | ICD-10-CM | POA: Diagnosis not present

## 2023-01-06 ENCOUNTER — Ambulatory Visit (INDEPENDENT_AMBULATORY_CARE_PROVIDER_SITE_OTHER): Payer: PPO | Admitting: Gastroenterology

## 2023-01-06 ENCOUNTER — Encounter: Payer: Self-pay | Admitting: Gastroenterology

## 2023-01-06 VITALS — BP 138/70 | HR 69 | Ht 60.0 in | Wt 266.0 lb

## 2023-01-06 DIAGNOSIS — K760 Fatty (change of) liver, not elsewhere classified: Secondary | ICD-10-CM | POA: Diagnosis not present

## 2023-01-06 DIAGNOSIS — K5904 Chronic idiopathic constipation: Secondary | ICD-10-CM

## 2023-01-06 MED ORDER — LINACLOTIDE 145 MCG PO CAPS: 145.0000 ug | ORAL_CAPSULE | Freq: Every day | ORAL | 11 refills | Status: DC

## 2023-01-06 NOTE — Progress Notes (Signed)
Bonnie Gonzalez    [id number]    02/16/1956  Primary Care Physician:Fry, Ishmael Holter, MD  Referring Physician: Laurey Morale, MD [place],  [place] [address]   Chief complaint: Constipation  HPI:  56 yr very pleasant female here for follow-up visit for chronic constipation.  Overall she is doing much better with dietary changes, she is trying to eat prunes almost daily.  She is also taking Linzess daily with improvement of constipation.  Denies any rectal bleeding, melena, abdominal pain, nausea or vomiting.  BMI> 50. Other co-morbid conditions include OSA, HTN, COPD and CHF (EF 60-65%)   No family h/o colon cancer  Colonoscopy [place] 15, 2023 for positive Cologuard   Outpatient Encounter Medications as of 01/06/2023  Medication Sig   albuterol (PROVENTIL HFA;VENTOLIN HFA) 108 (90 Base) MCG/ACT inhaler Inhale 2 puffs into the lungs every 4 (four) hours as needed for wheezing or shortness of breath.   ALPRAZolam (XANAX) 0.5 MG tablet TAKE 1 TABLET (0.5 MG TOTAL) BY MOUTH 2 (TWO) TIMES DAILY AS NEEDED. (Patient taking differently: Take 0.25-0.5 mg by mouth 2 (two) times daily as needed for anxiety or sleep.)   atorvastatin (LIPITOR) 80 MG tablet Take 1 tablet by mouth once daily   azelastine (ASTELIN) 0.1 % nasal spray Place 1 spray into both nostrils 2 (two) times daily as needed for allergies.   benzonatate (TESSALON) 200 MG capsule TAKE 1 CAPSULE BY MOUTH EVERY 8 HOURS AS NEEDED FOR COUGH   buPROPion ER (WELLBUTRIN SR) 100 MG 12 hr tablet Take 100 mg by mouth daily.   cetirizine (ZYRTEC) 10 MG tablet Take 10 mg by mouth daily.   Cholecalciferol 125 MCG (5000 UT) TABS Take 5,000 Units by mouth in the morning.   Dextromethorphan-guaiFENesin (MUCINEX COUGH & CHEST CONGEST PO) Take 1 capsule by mouth as needed (Patient taking as needed).   DULoxetine (CYMBALTA) 60 MG capsule Take 1 capsule (60 mg total) by mouth daily. (Patient taking differently: Take 120  mg by mouth at bedtime. Take two capsules daily)   esomeprazole (NEXIUM) 20 MG capsule Take 1 capsule (20 mg total) by mouth daily before breakfast.   Fluticasone-Umeclidin-Vilant (TRELEGY ELLIPTA) 100-62.5-25 MCG/INH AEPB Inhale 1 puff into the lungs daily.   furosemide (LASIX) 40 MG tablet Take 1 tablet (40 mg total) by mouth daily. (Patient taking differently: Take 20 mg by mouth daily.)   glucose 4 GM chewable tablet Chew 1 tablet (4 g total) by mouth as needed for low blood sugar (< 70).   hydrOXYzine (VISTARIL) 25 MG capsule Take 1 capsule by mouth three times daily as needed   Hyprom-Naphaz-Polysorb-Zn Sulf (CLEAR EYES COMPLETE) SOLN Place 1-2 drops into both eyes as needed (for itching).   insulin aspart (NOVOLOG) 100 UNIT/ML injection Use sliding scale provided at time of discharge, administer 3 times daily with meals (Patient taking differently: Inject 15-48 Units into the skin See admin instructions. Inject 20-40 units subcutaneously 3 times daily with meals and inject 15-35 units subcutaneously at night if needed with nighttime snacking)   insulin glargine, 2 Unit Dial, (TOUJEO MAX SOLOSTAR) 300 UNIT/ML Solostar Pen Inject 80 Units into the skin 2 (two) times daily.   Insulin Pen Needle (B-D UF III MINI PEN NEEDLES) 31G X 5 MM MISC Use to administer insulin five times a day   levothyroxine (SYNTHROID) 175 MCG tablet TAKE 1 TABLET BY MOUTH ONCE DAILY BEFORE BREAKFAST   linaclotide (  LINZESS) 145 MCG CAPS capsule Take 1 capsule (145 mcg total) by mouth daily before breakfast.   methocarbamol (ROBAXIN) 500 MG tablet Take 1 tablet (500 mg total) by mouth every 6 (six) hours as needed for muscle spasms.   naproxen (NAPROSYN) 500 MG tablet Take 1 tablet by mouth twice daily   Nerve Stimulator (CLEVER CHOICE TENS UNIT) DEVI Place 1 Dose onto the skin as needed (pain.).   nystatin (MYCOSTATIN/NYSTOP) powder Apply 1 application topically 2 (two) times daily as needed (rash).   OXYGEN Inhale 2 L  into the lungs continuous.   primidone (MYSOLINE) 50 MG tablet Take 1 tablet (50 mg total) by mouth at bedtime.   Probiotic Product (MISC INTESTINAL FLORA REGULAT) CAPS Take 1 capsule by mouth daily.   temazepam (RESTORIL) 15 MG capsule Take 15 mg by mouth at bedtime.   terbinafine (LAMISIL) 250 MG tablet Take 1 tablet (250 mg total) by mouth daily.   tirzepatide [facility]) 7.5 MG/0.5ML Pen Inject 7.5 mg into the skin every Sunday.   No facility-administered encounter medications on file as of 01/06/2023.    Allergies as of 01/06/2023 - Review Complete 01/06/2023  Allergen Reaction Noted   Aspirin  01/10/2019   Hydrocodone-acetaminophen Hives 11/03/2007   Oxycodone-acetaminophen Hives 11/03/2007   Penicillins Hives 11/03/2007   Codeine Hives 11/03/2007   Fluoxetine hcl Itching and Other (See Comments) 11/03/2007   Trazodone Other (See Comments) 02/02/2019    Past Medical History:  Diagnosis Date   Acquired hypothyroidism 10/01/2010   Allergic rhinitis 07/20/2009   Arthritis    Back pain 10/18/2015   Bilateral leg edema 03/31/2018   Centrilobular emphysema 02/04/2019   HRCT 09/15/18   Chronic diastolic CHF (congestive heart failure) 01/28/2019   Chronic respiratory failure with hypoxia 02/04/2019   6 Min Walk 08/31/18 at [facility]   Chronic urticaria 12/21/2019   COPD (chronic obstructive pulmonary disease) 09/01/2018   Quit smoking 2010  - Spirometry 08/31/2018  FEV1 1.1 (51%)  Ratio 66 s prior rx with typical curvature  - 08/31/2018  After extensive coaching inhaler device,  effectiveness =    90% with elipta > try anoro sample > no better so d/c'  - PFT's  11/10/2018  FEV1 1.24 (59 % ) ratio 75 (ratio with fev1/VC =  63)  p no % improvement from saba p nothing prior to study with DLCO  105  % corrects to 132  %    DOE (dyspnea on exertion) 08/31/2018   Onset 05/2018 with background of unexplained leg swelling x 01/2018   -echo 04/13/18 just G1   diastolic dysfunction s PH   -   08/31/2018   Walked RA x one lap @ 185 stopped due to  Sob/ sats 89% at nl pace  - Spirometry 08/31/2018  FEV1 1.1 (51%)  Ratio 66 s prior rx  - 09/28/2018 could not do full pfts 09/28/2018  - Collagen vasc profile 09/28/18  For ESR =  54 (was 102 before prednisone)  Neg collag   Dyshidrotic eczema 12/13/2010   Essential hypertension 01/26/2019   Generalized anxiety disorder with panic attacks 01/20/2018   GERD (gastroesophageal reflux disease) 11/03/2007   Headache 11/03/2007   Irritable bowel syndrome 07/13/2009   Major depressive disorder 07/17/2020   Mixed hyperlipidemia 07/20/2009   Obesity    OSA (obstructive sleep apnea) 07/26/2015   HST 08/2015 mild OSA, AHI 8/hour, lowest desaturation 81%, desaturations noted without respiratory events  Formatting of this note might be different from the  original. PSG 05/20/19 AHI 25.1   Osteopenia of multiple sites 06/01/2019   DEXA 05/24/2019: Right femur neck T- 2.2, left femur neck T- 1.5, right total femur T -0.5, left total femur T -0.5, AP total spine T- 1.9. FRAX 10-year probability of major osteoporotic fracture is 8.8% and a hip fracture is 1.2%  DEXA 05/05/2019: Right femur neck T- 1.9, left femur neck T- 2.0, right total femur T -1.3, left total femur   Oxygen deficiency    Postinflammatory pulmonary fibrosis 09/15/2018   ESR    10/161/9  = 102   > rx short term prednisone ? slt improvement in symptoms HRCT 09/15/2018  1. Very mild basilar subpleural ground-glass, reticulation and traction bronchiolectasis, findings which may be minimally progressive from 06/29/2017 and are indicative of interstitial lung disease such as nonspecific interstitial pneumonitis. Findings are indeterminate for UIP per consensus guidelin   Psychophysiological insomnia 03/29/2019   Type 2 diabetes mellitus 02/09/2017   UTI (urinary tract infection)    Vertigo 03/29/2014   Vitamin D deficiency 09/15/2020    Past Surgical History:  Procedure Laterality Date   ANKLE  FRACTURE SURGERY Right    benign tumor      removed from groin   COLONOSCOPY WITH PROPOFOL N/A 05/15/2022   Procedure: COLONOSCOPY WITH PROPOFOL;  Surgeon: Mauri Pole, MD;  Location: [facility];  Service: Gastroenterology;  Laterality: N/A;   HOT HEMOSTASIS N/A 05/15/2022   Procedure: HOT HEMOSTASIS (ARGON PLASMA COAGULATION/BICAP);  Surgeon: Mauri Pole, MD;  Location: [facility];  Service: Gastroenterology;  Laterality: N/A;   POLYPECTOMY  05/15/2022   Procedure: POLYPECTOMY;  Surgeon: Mauri Pole, MD;  Location: [facility];  Service: Gastroenterology;;   WRIST ARTHROCENTESIS N/A 2001    Family History  Problem Relation Age of Onset   Arthritis/Rheumatoid Mother    Alzheimer's disease Father    Diabetes Mellitus II Father    Dementia Father    Diabetes Mellitus II Sister    Dementia Brother    Diabetes Mellitus II Brother    Asthma Other        fhx   Depression Other        fhx   Diabetes Other        fhx   Parkinsonism Other        fhx   Lupus Other        fhx    Social History   Socioeconomic History   Marital status: Divorced    Spouse name: Not on file   Number of children: 4   Years of education: 13   Highest education level: Some college, no degree  Occupational History    Comment: retired  Tobacco Use   Smoking status: Former    Packs/day: 1.00    Years: 35.00    Total pack years: 35.00    Types: Cigarettes    Quit date: 12/01/2008    Years since quitting: 14.1   Smokeless tobacco: Never  Vaping Use   Vaping Use: Never used  Substance and Sexual Activity   Alcohol use: Not Currently   Drug use: No   Sexual activity: Not Currently  Other Topics Concern   Not on file  Social History Narrative   Lives alone.   She has four grown children.   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   Highest level of education:  1.5 years of college      Right Handed   Is on 2 L  of oxygen    Social  Determinants of Health   Financial Resource Strain: Low Risk  (04/15/2022)   Overall Financial Resource Strain (CARDIA)    Difficulty of Paying Living Expenses: Not hard at all  Food Insecurity: No Food Insecurity (04/15/2022)   Hunger Vital Sign    Worried About Running Out of Food in the Last Year: Never true    Ran Out of Food in the Last Year: Never true  Transportation Needs: No Transportation Needs (04/15/2022)   PRAPARE - Hydrologist (Medical): No    Lack of Transportation (Non-Medical): No  Physical Activity: Inactive (04/15/2022)   Exercise Vital Sign    Days of Exercise per Week: 0 days    Minutes of Exercise per Session: 0 min  Stress: No Stress Concern Present (04/15/2022)   [place]    Feeling of Stress : Only a little  Social Connections: Socially Isolated (04/15/2022)   Social Connection and Isolation Panel [NHANES]    Frequency of Communication with Friends and Family: More than three times a week    Frequency of Social Gatherings with Friends and Family: More than three times a week    Attends Religious Services: Never    Marine scientist or Organizations: No    Attends Archivist Meetings: Never    Marital Status: Divorced  Human resources officer Violence: Not At Risk (04/15/2022)   Humiliation, Afraid, Rape, and Kick questionnaire    Fear of Current or Ex-Partner: No    Emotionally Abused: No    Physically Abused: No    Sexually Abused: No      Review of systems: All other review of systems negative except as mentioned in the HPI.   Physical Exam: Vitals:   01/06/23 1122  BP: 138/70  Pulse: 69   Body mass index is 51.95 kg/m. Gen:      No acute distress HEENT:  sclera anicteric Abd:      soft, non-tender; no palpable masses, no distension Ext:    No edema Neuro: alert and oriented x 3 Psych: normal mood and affect  Data Reviewed:  Reviewed  labs, radiology imaging, old records and pertinent past GI work up   Assessment and Plan/Recommendations:  75 yr very pleasant female with HTN, COPD, DM on insulin and diastolic CHF here for follow-up visit for chronic idiopathic constipation Continue Linzess 145 mcg daily Continue with high-fiber diet and water intake  Fatty liver: discussed dietary changes and exercise Avoid soda or drinks with high fructose corn syrup Avoid alcohol or herbal remedies and limit use of NSAID's Monitor LFT every 4-6 months  Return in 6 months  The patient was provided an opportunity to ask questions and all were answered. The patient agreed with the plan and demonstrated an understanding of the instructions.  Damaris Hippo , MD     CC: Laurey Morale, MD

## 2023-01-06 NOTE — Patient Instructions (Signed)
We have sent the following medications to your pharmacy for you to pick up at your convenience: Linzess.   The [facility] providers would like to encourage you to use [facility] to communicate with providers for non-urgent requests or questions.  Due to long hold times on the telephone, sending your provider a message by [facility] may be a faster and more efficient way to get a response.  Please allow 48 business hours for a response.  Please remember that this is for non-urgent requests.

## 2023-01-07 ENCOUNTER — Other Ambulatory Visit: Payer: Self-pay | Admitting: Family Medicine

## 2023-01-10 DIAGNOSIS — I5032 Chronic diastolic (congestive) heart failure: Secondary | ICD-10-CM | POA: Diagnosis not present

## 2023-01-10 DIAGNOSIS — J449 Chronic obstructive pulmonary disease, unspecified: Secondary | ICD-10-CM | POA: Diagnosis not present

## 2023-01-12 DIAGNOSIS — M170 Bilateral primary osteoarthritis of knee: Secondary | ICD-10-CM | POA: Diagnosis not present

## 2023-01-12 DIAGNOSIS — M1711 Unilateral primary osteoarthritis, right knee: Secondary | ICD-10-CM | POA: Diagnosis not present

## 2023-01-12 DIAGNOSIS — M1712 Unilateral primary osteoarthritis, left knee: Secondary | ICD-10-CM | POA: Diagnosis not present

## 2023-01-15 ENCOUNTER — Other Ambulatory Visit: Payer: Self-pay

## 2023-01-15 ENCOUNTER — Ambulatory Visit (HOSPITAL_BASED_OUTPATIENT_CLINIC_OR_DEPARTMENT_OTHER): Payer: PPO | Attending: Family Medicine | Admitting: Physical Therapy

## 2023-01-15 ENCOUNTER — Encounter (HOSPITAL_BASED_OUTPATIENT_CLINIC_OR_DEPARTMENT_OTHER): Payer: Self-pay | Admitting: Physical Therapy

## 2023-01-15 DIAGNOSIS — J9611 Chronic respiratory failure with hypoxia: Secondary | ICD-10-CM | POA: Insufficient documentation

## 2023-01-15 DIAGNOSIS — R2689 Other abnormalities of gait and mobility: Secondary | ICD-10-CM | POA: Insufficient documentation

## 2023-01-15 DIAGNOSIS — R5381 Other malaise: Secondary | ICD-10-CM | POA: Diagnosis not present

## 2023-01-15 NOTE — Therapy (Signed)
OUTPATIENT PHYSICAL THERAPY LOWER EXTREMITY EVALUATION   Patient Name: Bonnie Gonzalez MRN: [id number] DOB:02/21/1956, 67 y.o., female Today's Date: 01/16/2023  END OF SESSION:  PT End of Session - 01/15/23 1446     Visit Number 1    Number of Visits 16    Date for PT Re-Evaluation 03/12/23    PT Start Time 0943   Patient 13 min late   PT Stop Time 1015    PT Time Calculation (min) 32 min    Activity Tolerance Patient tolerated treatment well    Behavior During Therapy [facility] for tasks assessed/performed             Past Medical History:  Diagnosis Date   Acquired hypothyroidism 10/01/2010   Allergic rhinitis 07/20/2009   Arthritis    Back pain 10/18/2015   Bilateral leg edema 03/31/2018   Centrilobular emphysema 02/04/2019   HRCT 09/15/18   Chronic diastolic CHF (congestive heart failure) 01/28/2019   Chronic respiratory failure with hypoxia 02/04/2019   6 Min Walk 08/31/18 at [facility]   Chronic urticaria 12/21/2019   COPD (chronic obstructive pulmonary disease) 09/01/2018   Quit smoking 2010  - Spirometry 08/31/2018  FEV1 1.1 (51%)  Ratio 66 s prior rx with typical curvature  - 08/31/2018  After extensive coaching inhaler device,  effectiveness =    90% with elipta > try anoro sample > no better so d/c'  - PFT's  11/10/2018  FEV1 1.24 (59 % ) ratio 75 (ratio with fev1/VC =  63)  p no % improvement from saba p nothing prior to study with DLCO  105  % corrects to 132  %    DOE (dyspnea on exertion) 08/31/2018   Onset 05/2018 with background of unexplained leg swelling x 01/2018   -echo 04/13/18 just G1   diastolic dysfunction s PH   -  08/31/2018   Walked RA x one lap @ 185 stopped due to  Sob/ sats 89% at nl pace  - Spirometry 08/31/2018  FEV1 1.1 (51%)  Ratio 66 s prior rx  - 09/28/2018 could not do full pfts 09/28/2018  - Collagen vasc profile 09/28/18  For ESR =  54 (was 102 before prednisone)  Neg collag   Dyshidrotic eczema 12/13/2010   Essential hypertension  01/26/2019   Generalized anxiety disorder with panic attacks 01/20/2018   GERD (gastroesophageal reflux disease) 11/03/2007   Headache 11/03/2007   Irritable bowel syndrome 07/13/2009   Major depressive disorder 07/17/2020   Mixed hyperlipidemia 07/20/2009   Obesity    OSA (obstructive sleep apnea) 07/26/2015   HST 08/2015 mild OSA, AHI 8/hour, lowest desaturation 81%, desaturations noted without respiratory events  Formatting of this note might be different from the original. PSG 05/20/19 AHI 25.1   Osteopenia of multiple sites 06/01/2019   DEXA 05/24/2019: Right femur neck T- 2.2, left femur neck T- 1.5, right total femur T -0.5, left total femur T -0.5, AP total spine T- 1.9. FRAX 10-year probability of major osteoporotic fracture is 8.8% and a hip fracture is 1.2%  DEXA 05/05/2019: Right femur neck T- 1.9, left femur neck T- 2.0, right total femur T -1.3, left total femur   Oxygen deficiency    Postinflammatory pulmonary fibrosis 09/15/2018   ESR    10/161/9  = 102   > rx short term prednisone ? slt improvement in symptoms HRCT 09/15/2018  1. Very mild basilar subpleural ground-glass, reticulation and traction bronchiolectasis, findings which may be minimally progressive from 06/29/2017 and  are indicative of interstitial lung disease such as nonspecific interstitial pneumonitis. Findings are indeterminate for UIP per consensus guidelin   Psychophysiological insomnia 03/29/2019   Type 2 diabetes mellitus 02/09/2017   UTI (urinary tract infection)    Vertigo 03/29/2014   Vitamin D deficiency 09/15/2020   Past Surgical History:  Procedure Laterality Date   ANKLE FRACTURE SURGERY Right    benign tumor      removed from groin   COLONOSCOPY WITH PROPOFOL N/A 05/15/2022   Procedure: COLONOSCOPY WITH PROPOFOL;  Surgeon: Mauri Pole, MD;  Location: [facility];  Service: Gastroenterology;  Laterality: N/A;   HOT HEMOSTASIS N/A 05/15/2022   Procedure: HOT HEMOSTASIS (ARGON PLASMA  COAGULATION/BICAP);  Surgeon: Mauri Pole, MD;  Location: [facility];  Service: Gastroenterology;  Laterality: N/A;   POLYPECTOMY  05/15/2022   Procedure: POLYPECTOMY;  Surgeon: Mauri Pole, MD;  Location: [facility];  Service: Gastroenterology;;   WRIST ARTHROCENTESIS N/A 2001   Patient Active Problem List   Diagnosis Date Noted   Morbid obesity ([place]) 06/16/2022   Physical deconditioning 06/16/2022   AVM (arteriovenous malformation) of colon, acquired with hemorrhage    Positive colorectal cancer screening using Cologuard test    Polyp of sigmoid colon    Polyp of transverse colon    Polyp of cecum    COVID-19 virus infection 11/21/2021   Vitamin D deficiency 09/15/2020   Major depressive disorder 07/17/2020   Chronic urticaria 12/21/2019   Osteopenia of multiple sites 06/01/2019   Psychophysiological insomnia 03/29/2019   Centrilobular emphysema 02/04/2019   Chronic respiratory failure with hypoxia 02/04/2019   Chronic diastolic CHF (congestive heart failure) 01/28/2019   Essential hypertension 01/26/2019   Postinflammatory pulmonary fibrosis 09/15/2018   COPD (chronic obstructive pulmonary disease) 09/01/2018   DOE (dyspnea on exertion) 08/31/2018   Bilateral leg edema 03/31/2018   Generalized anxiety disorder with panic attacks 01/20/2018   Type 2 diabetes mellitus 02/09/2017   Back pain 10/18/2015   OSA (obstructive sleep apnea) 07/26/2015   Vertigo 03/29/2014   Dyshidrotic eczema 12/13/2010   Acquired hypothyroidism 10/01/2010   Mixed hyperlipidemia 07/20/2009   Allergic rhinitis 07/20/2009   Irritable bowel syndrome 07/13/2009   GERD (gastroesophageal reflux disease) 11/03/2007   Headache 11/03/2007    PCP: [name] MD   REFERRING PROVIDER: [name] MD   REFERRING DIAG:  Diagnosis  J96.11 (ICD-10-CM) - Chronic respiratory failure with hypoxia ([place])    THERAPY DIAG:  Other abnormalities of gait and mobility  Physical  deconditioning  Rationale for Evaluation and Treatment: Rehabilitation  ONSET DATE: has been on O2 for 3 years   SUBJECTIVE:   SUBJECTIVE STATEMENT: 3 years ago patient suffered respiratory failure and was hospitalized for 5 days.  She came out of the hospital on oxygen has not been able to get off oxygen since that point.  The patient is currently on 2L of O2. She has been on it for 2 years. The patient also has a baseline of left knee pain. She is using a walker for primary mobility. She feels short of breath with short distance ambulation.  She feels like over the past year she has not had a progressive loss of ambulation endurance.  She reports fatigue today walking in from the parking lot.  PERTINENT HISTORY: Knees, Loweback, and wrist arthritis. CHF taking lasix, post inflamitory pulmonary fibrosis; vertigo ( still has syncope at times)  PAIN:  Are you having pain? Yes: NPRS scale: 5/10 Pain location: Left knee Pain description:  Aching Aggravating factors: Standing walking Relieving factors: Sitting  PRECAUTIONS: falls   WEIGHT BEARING RESTRICTIONS: No  FALLS:  Has patient fallen in last 6 months? No but has ad some near falls   LIVING ENVIRONMENT: Ramp into the house   OCCUPATION: retired  Recreation: would like to be able to get back to gardening  PLOF: Independent with household mobility with device  PATIENT GOALS: To increase endurance  NEXT MD VISIT: Nothing scheduled  OBJECTIVE:   DIAGNOSTIC FINDINGS: Nothing of the knee  PATIENT SURVEYS:  FOTO deferred 2nd to time   COGNITION: Overall cognitive status: Within functional limits for tasks assessed     SENSATION: Has had tinglining in her hands. Has been using wrist guards  EDEMA:   MUSCLE LENGTH:  POSTURE: rounded shoulders, forward head, and flexed trunk   PALPATION: No unexpected tenderness palpation  LOWER EXTREMITY ROM:  Passive ROM Right eval Left eval  Hip flexion    Hip extension     Hip abduction    Hip adduction    Hip internal rotation    Hip external rotation    Knee flexion  Painful at end range  Knee extension    Ankle dorsiflexion    Ankle plantarflexion    Ankle inversion    Ankle eversion     (Blank rows = not tested)  LOWER EXTREMITY MMT:  MMT Right eval Left eval  Hip flexion 19.7 17.0  Hip extension    Hip abduction 36.9 40.3  Hip adduction    Hip internal rotation    Hip external rotation    Knee flexion    Knee extension 20 18.7  Ankle dorsiflexion    Ankle plantarflexion    Ankle inversion    Ankle eversion     (Blank rows = not tested)  LOWER EXTREMITY SPECIAL TESTS:   5x sit to stand: 24 sec   Hr 85  Sao2 96   GAIT: Patient ambulated from her car into clinic distance of approximately 200 feet.  She reported dyspnea and reported moderate fatigue.  SaO2 97%, heart rate 110 After rest heart rate returned to 77 bpm SaO2 is still at 97%  TODAY'S TREATMENT:                                                                                                                              DATE: Patient lates no time for HEP   PATIENT EDUCATION:  Education details: HEP, symptom management, activity progression Person educated: Patient Education method: Explanation, Demonstration, Tactile cues, and Verbal cues Education comprehension: verbalized understanding, returned demonstration, verbal cues required, tactile cues required, and needs further education  HOME EXERCISE PROGRAM: No time for HEP today  ASSESSMENT:  CLINICAL IMPRESSION: Patient is a 67 year old female who presents to physical therapy with longstanding deconditioning.  She became short of breath walking from the parking lot into the clinic today.  She presents with decreased lower extremity strength and decreased functional  endurance.  Her sit to stand time puts her at a fall risk.  She also has baseline balance issues.  We did not have time today to do a 6-minute walk  test but will perform next visit.  The patient is very motivated.  She would benefit from further skilled therapy to improve endurance with functional tasks.  OBJECTIVE IMPAIRMENTS: Abnormal gait, cardiopulmonary status limiting activity, decreased activity tolerance, decreased balance, decreased endurance, decreased mobility, difficulty walking, decreased ROM, decreased strength, and pain.   ACTIVITY LIMITATIONS: carrying, lifting, bending, standing, squatting, stairs, transfers, bathing, dressing, and locomotion level  PARTICIPATION LIMITATIONS: meal prep, cleaning, laundry, driving, shopping, community activity, and yard work  PERSONAL FACTORS: 3+ comorbidities: Knees, Loweback, and wrist arthritis. CHF taking lasix, post inflamitory pulmonary fibrosis; vertigo ( still has syncope at times) are also affecting patient's functional outcome.   REHAB POTENTIAL: Good  CLINICAL DECISION MAKING: Evolving/moderate complexity declining overall mobility   EVALUATION COMPLEXITY: Moderate   GOALS: Goals reviewed with patient? Yes  SHORT TERM GOALS: Target date: 02/13/2023   Therapy will perform 6-minute walk test with patient and come up with baseline goals Baseline: Goal status: INITIAL  2.  Patient will decrease sit to stand time time by 5 seconds Baseline:  Goal status: INITIAL  3.  Patient will perform 10 minutes of interval training on NuStep while maintaining a heart rate less than 110 Baseline:  Goal status: INITIAL LONG TERM GOALS: Target date: 03/13/2023    Patient will have a complete exercise program that she can take to [employer] sneakers and continue long-term Baseline:  Goal status: INITIAL  2.  Patient will ambulate 600 feet without self-report of dyspnea in order to attend appointments without being short of breath Baseline:  Goal status: INITIAL  3.  Patient will stand for greater than 20 minutes without increased pain in left knee and increased dyspnea in order to  perform ADLs Baseline:  Goal status: INITIAL    PLAN:  PT FREQUENCY: 2x/week  PT DURATION: 8 weeks  PLANNED INTERVENTIONS: Therapeutic exercises, Therapeutic activity, Neuromuscular re-education, Balance training, Gait training, Patient/Family education, Self Care, Joint mobilization, Stair training, DME instructions, Aquatic Therapy, Dry Needling, Electrical stimulation, Spinal mobilization, Cryotherapy, Moist heat, Ultrasound, and Manual therapy  PLAN FOR NEXT SESSION: 3 or 6 minute walk test. Begin interval training on the nu-step. Monitor Sao2 and HR with activity. Give LE and UE seated series. Unable to give HEP on the first visit.    Carney Living, PT 01/16/2023, 12:42 PM

## 2023-01-18 ENCOUNTER — Other Ambulatory Visit: Payer: Self-pay | Admitting: Family Medicine

## 2023-01-19 ENCOUNTER — Encounter (HOSPITAL_BASED_OUTPATIENT_CLINIC_OR_DEPARTMENT_OTHER): Payer: Self-pay

## 2023-01-19 ENCOUNTER — Ambulatory Visit (HOSPITAL_BASED_OUTPATIENT_CLINIC_OR_DEPARTMENT_OTHER): Payer: PPO

## 2023-01-19 DIAGNOSIS — R2689 Other abnormalities of gait and mobility: Secondary | ICD-10-CM | POA: Diagnosis not present

## 2023-01-19 DIAGNOSIS — R5381 Other malaise: Secondary | ICD-10-CM

## 2023-01-19 NOTE — Therapy (Signed)
OUTPATIENT PHYSICAL THERAPY LOWER EXTREMITY EVALUATION   Patient Name: Bonnie Gonzalez MRN: [id number] DOB:Oct 17, 1956, 67 y.o., female Today's Date: 01/19/2023  END OF SESSION:  PT End of Session - 01/19/23 1430     Visit Number 2    Number of Visits 16    Date for PT Re-Evaluation 03/12/23    PT Start Time 1430    PT Stop Time 1510    PT Time Calculation (min) 40 min    Activity Tolerance Patient tolerated treatment well    Behavior During Therapy [facility] for tasks assessed/performed              Past Medical History:  Diagnosis Date   Acquired hypothyroidism 10/01/2010   Allergic rhinitis 07/20/2009   Arthritis    Back pain 10/18/2015   Bilateral leg edema 03/31/2018   Centrilobular emphysema 02/04/2019   HRCT 09/15/18   Chronic diastolic CHF (congestive heart failure) 01/28/2019   Chronic respiratory failure with hypoxia 02/04/2019   6 Min Walk 08/31/18 at [facility]   Chronic urticaria 12/21/2019   COPD (chronic obstructive pulmonary disease) 09/01/2018   Quit smoking 2010  - Spirometry 08/31/2018  FEV1 1.1 (51%)  Ratio 66 s prior rx with typical curvature  - 08/31/2018  After extensive coaching inhaler device,  effectiveness =    90% with elipta > try anoro sample > no better so d/c'  - PFT's  11/10/2018  FEV1 1.24 (59 % ) ratio 75 (ratio with fev1/VC =  63)  p no % improvement from saba p nothing prior to study with DLCO  105  % corrects to 132  %    DOE (dyspnea on exertion) 08/31/2018   Onset 05/2018 with background of unexplained leg swelling x 01/2018   -echo 04/13/18 just G1   diastolic dysfunction s PH   -  08/31/2018   Walked RA x one lap @ 185 stopped due to  Sob/ sats 89% at nl pace  - Spirometry 08/31/2018  FEV1 1.1 (51%)  Ratio 66 s prior rx  - 09/28/2018 could not do full pfts 09/28/2018  - Collagen vasc profile 09/28/18  For ESR =  54 (was 102 before prednisone)  Neg collag   Dyshidrotic eczema 12/13/2010   Essential hypertension 01/26/2019   Generalized  anxiety disorder with panic attacks 01/20/2018   GERD (gastroesophageal reflux disease) 11/03/2007   Headache 11/03/2007   Irritable bowel syndrome 07/13/2009   Major depressive disorder 07/17/2020   Mixed hyperlipidemia 07/20/2009   Obesity    OSA (obstructive sleep apnea) 07/26/2015   HST 08/2015 mild OSA, AHI 8/hour, lowest desaturation 81%, desaturations noted without respiratory events  Formatting of this note might be different from the original. PSG 05/20/19 AHI 25.1   Osteopenia of multiple sites 06/01/2019   DEXA 05/24/2019: Right femur neck T- 2.2, left femur neck T- 1.5, right total femur T -0.5, left total femur T -0.5, AP total spine T- 1.9. FRAX 10-year probability of major osteoporotic fracture is 8.8% and a hip fracture is 1.2%  DEXA 05/05/2019: Right femur neck T- 1.9, left femur neck T- 2.0, right total femur T -1.3, left total femur   Oxygen deficiency    Postinflammatory pulmonary fibrosis 09/15/2018   ESR    10/161/9  = 102   > rx short term prednisone ? slt improvement in symptoms HRCT 09/15/2018  1. Very mild basilar subpleural ground-glass, reticulation and traction bronchiolectasis, findings which may be minimally progressive from 06/29/2017 and are indicative of interstitial  lung disease such as nonspecific interstitial pneumonitis. Findings are indeterminate for UIP per consensus guidelin   Psychophysiological insomnia 03/29/2019   Type 2 diabetes mellitus 02/09/2017   UTI (urinary tract infection)    Vertigo 03/29/2014   Vitamin D deficiency 09/15/2020   Past Surgical History:  Procedure Laterality Date   ANKLE FRACTURE SURGERY Right    benign tumor      removed from groin   COLONOSCOPY WITH PROPOFOL N/A 05/15/2022   Procedure: COLONOSCOPY WITH PROPOFOL;  Surgeon: Mauri Pole, MD;  Location: [facility];  Service: Gastroenterology;  Laterality: N/A;   HOT HEMOSTASIS N/A 05/15/2022   Procedure: HOT HEMOSTASIS (ARGON PLASMA COAGULATION/BICAP);  Surgeon:  Mauri Pole, MD;  Location: [facility];  Service: Gastroenterology;  Laterality: N/A;   POLYPECTOMY  05/15/2022   Procedure: POLYPECTOMY;  Surgeon: Mauri Pole, MD;  Location: [facility];  Service: Gastroenterology;;   WRIST ARTHROCENTESIS N/A 2001   Patient Active Problem List   Diagnosis Date Noted   Morbid obesity ([place]) 06/16/2022   Physical deconditioning 06/16/2022   AVM (arteriovenous malformation) of colon, acquired with hemorrhage    Positive colorectal cancer screening using Cologuard test    Polyp of sigmoid colon    Polyp of transverse colon    Polyp of cecum    COVID-19 virus infection 11/21/2021   Vitamin D deficiency 09/15/2020   Major depressive disorder 07/17/2020   Chronic urticaria 12/21/2019   Osteopenia of multiple sites 06/01/2019   Psychophysiological insomnia 03/29/2019   Centrilobular emphysema 02/04/2019   Chronic respiratory failure with hypoxia 02/04/2019   Chronic diastolic CHF (congestive heart failure) 01/28/2019   Essential hypertension 01/26/2019   Postinflammatory pulmonary fibrosis 09/15/2018   COPD (chronic obstructive pulmonary disease) 09/01/2018   DOE (dyspnea on exertion) 08/31/2018   Bilateral leg edema 03/31/2018   Generalized anxiety disorder with panic attacks 01/20/2018   Type 2 diabetes mellitus 02/09/2017   Back pain 10/18/2015   OSA (obstructive sleep apnea) 07/26/2015   Vertigo 03/29/2014   Dyshidrotic eczema 12/13/2010   Acquired hypothyroidism 10/01/2010   Mixed hyperlipidemia 07/20/2009   Allergic rhinitis 07/20/2009   Irritable bowel syndrome 07/13/2009   GERD (gastroesophageal reflux disease) 11/03/2007   Headache 11/03/2007    PCP: [name] MD   REFERRING PROVIDER: [name] MD   REFERRING DIAG:  Diagnosis  J96.11 (ICD-10-CM) - Chronic respiratory failure with hypoxia ([place])    THERAPY DIAG:  Other abnormalities of gait and mobility  Physical deconditioning  Rationale for Evaluation  and Treatment: Rehabilitation  ONSET DATE: has been on O2 for 3 years   SUBJECTIVE:   SUBJECTIVE STATEMENT: Pt reports no significant fatigue after IE.   PERTINENT HISTORY: Knees, Loweback, and wrist arthritis. CHF taking lasix, post inflamitory pulmonary fibrosis; vertigo ( still has syncope at times)  PAIN:  Are you having pain? Yes: NPRS scale: 5/10 Pain location: Left knee Pain description: Aching Aggravating factors: Standing walking Relieving factors: Sitting  PRECAUTIONS: falls   WEIGHT BEARING RESTRICTIONS: No  FALLS:  Has patient fallen in last 6 months? No but has ad some near falls   LIVING ENVIRONMENT: Ramp into the house   OCCUPATION: retired  Recreation: would like to be able to get back to gardening  PLOF: Independent with household mobility with device  PATIENT GOALS: To increase endurance  NEXT MD VISIT: Nothing scheduled  OBJECTIVE:   DIAGNOSTIC FINDINGS: Nothing of the knee  PATIENT SURVEYS:  FOTO deferred 2nd to time   COGNITION: Overall cognitive  status: Within functional limits for tasks assessed     SENSATION: Has had tinglining in her hands. Has been using wrist guards  EDEMA:   MUSCLE LENGTH:  POSTURE: rounded shoulders, forward head, and flexed trunk   PALPATION: No unexpected tenderness palpation  LOWER EXTREMITY ROM:  Passive ROM Right eval Left eval  Hip flexion    Hip extension    Hip abduction    Hip adduction    Hip internal rotation    Hip external rotation    Knee flexion  Painful at end range  Knee extension    Ankle dorsiflexion    Ankle plantarflexion    Ankle inversion    Ankle eversion     (Blank rows = not tested)  LOWER EXTREMITY MMT:  MMT Right eval Left eval  Hip flexion 19.7 17.0  Hip extension    Hip abduction 36.9 40.3  Hip adduction    Hip internal rotation    Hip external rotation    Knee flexion    Knee extension 20 18.7  Ankle dorsiflexion    Ankle plantarflexion    Ankle  inversion    Ankle eversion     (Blank rows = not tested)  LOWER EXTREMITY SPECIAL TESTS:   5x sit to stand: 24 sec   Hr 85  Sao2 96   GAIT: Ambulated 145f x2. Seated rest break between. SpO2 was 93% after 1st 1346f Waited until it rose to 97% before walking back 13012fAgain decreased to 92% after second time, but quickly rose back to 97% after seated rest break.  TODAY'S TREATMENT:                                                                                                                              DATE: 2/19  -Gait- 2x130f54feated rest break between  -Standing marching 2x10 -Standing heel raise 2x10 -partial squats- 2x10  -standing hip abduction- 2x10 ea -Standing hip ext- 2x10ea -Standing HSC- 2x10ea -Seated adductor squeeze- 5" x20   PATIENT EDUCATION:  Education details: HEP, symptom management, activity progression Person educated: Patient Education method: Explanation, Demonstration, Tactile cues, and Verbal cues Education comprehension: verbalized understanding, returned demonstration, verbal cues required, tactile cues required, and needs further education  HOME EXERCISE PROGRAM: Access Code: [email or web address] URL: [email or web address] Date: 01/19/2023 Prepared by: RebeSherlynn Carbonercises - Heel Raises with Counter Support  - 1-2 x daily - 7 x weekly - 2 sets - 10 reps - Standing March with Counter Support  - 1-2 x daily - 7 x weekly - 2 sets - 10 reps - Standing Hip Abduction with Counter Support  - 1-2 x daily - 7 x weekly - 2 sets - 10 reps - Standing Hip Extension with Counter Support  - 1-2 x daily - 7 x weekly - 2 sets - 10 reps - Mini Squat with Counter Support  - 1-2 x daily - 7 x weekly - 2 sets - 10  reps - Standing Knee Flexion with Counter Support  - 1-2 x daily - 7 x weekly - 2 sets - 10 reps  ASSESSMENT:  CLINICAL IMPRESSION: Worked on gait training in beginning of session with focus on endurance. Able to ambulate 130 ft x2 with  seated rest break in between. She did drop in spO2 to 93% which improved after rest for a minute and cues for deep breathing. With standing exercises pt was able to maintain O2 level of 96%-97%. HR rose to 105bpm, but mostly stayed in 90s. Required seated breaks between each set of exercises.  She was provided HEP and educated on proper frequency and safe performance. She has pulse ox at home she can use to monitor her O2 level and HR.   OBJECTIVE IMPAIRMENTS: Abnormal gait, cardiopulmonary status limiting activity, decreased activity tolerance, decreased balance, decreased endurance, decreased mobility, difficulty walking, decreased ROM, decreased strength, and pain.   ACTIVITY LIMITATIONS: carrying, lifting, bending, standing, squatting, stairs, transfers, bathing, dressing, and locomotion level  PARTICIPATION LIMITATIONS: meal prep, cleaning, laundry, driving, shopping, community activity, and yard work  PERSONAL FACTORS: 3+ comorbidities: Knees, Loweback, and wrist arthritis. CHF taking lasix, post inflamitory pulmonary fibrosis; vertigo ( still has syncope at times) are also affecting patient's functional outcome.   REHAB POTENTIAL: Good  CLINICAL DECISION MAKING: Evolving/moderate complexity declining overall mobility   EVALUATION COMPLEXITY: Moderate   GOALS: Goals reviewed with patient? Yes  SHORT TERM GOALS: Target date: 02/13/2023   Therapy will perform 6-minute walk test with patient and come up with baseline goals Baseline: Goal status: INITIAL  2.  Patient will decrease sit to stand time time by 5 seconds Baseline:  Goal status: INITIAL  3.  Patient will perform 10 minutes of interval training on NuStep while maintaining a heart rate less than 110 Baseline:  Goal status: INITIAL LONG TERM GOALS: Target date: 03/13/2023    Patient will have a complete exercise program that she can take to [employer] sneakers and continue long-term Baseline:  Goal status: INITIAL  2.   Patient will ambulate 600 feet without self-report of dyspnea in order to attend appointments without being short of breath Baseline:  Goal status: INITIAL  3.  Patient will stand for greater than 20 minutes without increased pain in left knee and increased dyspnea in order to perform ADLs Baseline:  Goal status: INITIAL    PLAN:  PT FREQUENCY: 2x/week  PT DURATION: 8 weeks  PLANNED INTERVENTIONS: Therapeutic exercises, Therapeutic activity, Neuromuscular re-education, Balance training, Gait training, Patient/Family education, Self Care, Joint mobilization, Stair training, DME instructions, Aquatic Therapy, Dry Needling, Electrical stimulation, Spinal mobilization, Cryotherapy, Moist heat, Ultrasound, and Manual therapy  PLAN FOR NEXT SESSION: 3 or 6 minute walk test. Begin interval training on the nu-step.    Graceann Congress Jarrid Lienhard, PTA 01/19/2023, 4:59 PM

## 2023-01-21 ENCOUNTER — Encounter: Payer: Self-pay | Admitting: Gastroenterology

## 2023-01-21 DIAGNOSIS — J449 Chronic obstructive pulmonary disease, unspecified: Secondary | ICD-10-CM | POA: Diagnosis not present

## 2023-01-22 ENCOUNTER — Telehealth: Payer: Self-pay

## 2023-01-22 NOTE — Telephone Encounter (Signed)
Please advise patient to take 292mg daily, she can start taking 2 capsules of 1445m daily. Please send new Rx for Linzess 29011mdaily. Thanks

## 2023-01-22 NOTE — Telephone Encounter (Signed)
Patient reports the Linzess 145 mcg has not been effective the past week. She has resorted to taking OTC stimulant laxative in addition to the [place]. She states "I will have several non productive attempts. I am straining to make anything move." The patient is inquiring about a stronger dosage for Linzess.

## 2023-01-23 ENCOUNTER — Other Ambulatory Visit: Payer: Self-pay

## 2023-01-23 MED ORDER — LINACLOTIDE 290 MCG PO CAPS: 290.0000 ug | ORAL_CAPSULE | Freq: Every day | ORAL | 11 refills | Status: DC

## 2023-01-23 NOTE — Telephone Encounter (Signed)
Spoke with the patient and discussed the recommendations. Patient agrees to this plan of care. Contacted the [employer]. Canceled her prescription for the Linzess 145 mcg. New Rx for Linzess 290 mcg transmitted.

## 2023-01-25 ENCOUNTER — Other Ambulatory Visit: Payer: Self-pay | Admitting: Family Medicine

## 2023-01-26 ENCOUNTER — Ambulatory Visit (HOSPITAL_BASED_OUTPATIENT_CLINIC_OR_DEPARTMENT_OTHER): Payer: PPO

## 2023-01-26 ENCOUNTER — Encounter (HOSPITAL_BASED_OUTPATIENT_CLINIC_OR_DEPARTMENT_OTHER): Payer: Self-pay

## 2023-01-26 DIAGNOSIS — R2689 Other abnormalities of gait and mobility: Secondary | ICD-10-CM | POA: Diagnosis not present

## 2023-01-26 DIAGNOSIS — R5381 Other malaise: Secondary | ICD-10-CM

## 2023-01-26 NOTE — Therapy (Signed)
OUTPATIENT PHYSICAL THERAPY LOWER EXTREMITY EVALUATION   Patient Name: Bonnie Gonzalez MRN: [id number] DOB:1956/10/19, 67 y.o., female Today's Date: 01/26/2023  END OF SESSION:  PT End of Session - 01/26/23 1258     Visit Number 3    Number of Visits 16    Date for PT Re-Evaluation 03/12/23    PT Start Time [id number]    PT Stop Time 1345    PT Time Calculation (min) 43 min    Activity Tolerance Patient tolerated treatment well    Behavior During Therapy [facility] for tasks assessed/performed              Past Medical History:  Diagnosis Date   Acquired hypothyroidism 10/01/2010   Allergic rhinitis 07/20/2009   Arthritis    Back pain 10/18/2015   Bilateral leg edema 03/31/2018   Centrilobular emphysema 02/04/2019   HRCT 09/15/18   Chronic diastolic CHF (congestive heart failure) 01/28/2019   Chronic respiratory failure with hypoxia 02/04/2019   6 Min Walk 08/31/18 at [facility]   Chronic urticaria 12/21/2019   COPD (chronic obstructive pulmonary disease) 09/01/2018   Quit smoking 2010  - Spirometry 08/31/2018  FEV1 1.1 (51%)  Ratio 66 s prior rx with typical curvature  - 08/31/2018  After extensive coaching inhaler device,  effectiveness =    90% with elipta > try anoro sample > no better so d/c'  - PFT's  11/10/2018  FEV1 1.24 (59 % ) ratio 75 (ratio with fev1/VC =  63)  p no % improvement from saba p nothing prior to study with DLCO  105  % corrects to 132  %    DOE (dyspnea on exertion) 08/31/2018   Onset 05/2018 with background of unexplained leg swelling x 01/2018   -echo 04/13/18 just G1   diastolic dysfunction s PH   -  08/31/2018   Walked RA x one lap @ 185 stopped due to  Sob/ sats 89% at nl pace  - Spirometry 08/31/2018  FEV1 1.1 (51%)  Ratio 66 s prior rx  - 09/28/2018 could not do full pfts 09/28/2018  - Collagen vasc profile 09/28/18  For ESR =  54 (was 102 before prednisone)  Neg collag   Dyshidrotic eczema 12/13/2010   Essential hypertension 01/26/2019   Generalized  anxiety disorder with panic attacks 01/20/2018   GERD (gastroesophageal reflux disease) 11/03/2007   Headache 11/03/2007   Irritable bowel syndrome 07/13/2009   Major depressive disorder 07/17/2020   Mixed hyperlipidemia 07/20/2009   Obesity    OSA (obstructive sleep apnea) 07/26/2015   HST 08/2015 mild OSA, AHI 8/hour, lowest desaturation 81%, desaturations noted without respiratory events  Formatting of this note might be different from the original. PSG 05/20/19 AHI 25.1   Osteopenia of multiple sites 06/01/2019   DEXA 05/24/2019: Right femur neck T- 2.2, left femur neck T- 1.5, right total femur T -0.5, left total femur T -0.5, AP total spine T- 1.9. FRAX 10-year probability of major osteoporotic fracture is 8.8% and a hip fracture is 1.2%  DEXA 05/05/2019: Right femur neck T- 1.9, left femur neck T- 2.0, right total femur T -1.3, left total femur   Oxygen deficiency    Postinflammatory pulmonary fibrosis 09/15/2018   ESR    10/161/9  = 102   > rx short term prednisone ? slt improvement in symptoms HRCT 09/15/2018  1. Very mild basilar subpleural ground-glass, reticulation and traction bronchiolectasis, findings which may be minimally progressive from 06/29/2017 and are indicative of interstitial  lung disease such as nonspecific interstitial pneumonitis. Findings are indeterminate for UIP per consensus guidelin   Psychophysiological insomnia 03/29/2019   Type 2 diabetes mellitus 02/09/2017   UTI (urinary tract infection)    Vertigo 03/29/2014   Vitamin D deficiency 09/15/2020   Past Surgical History:  Procedure Laterality Date   ANKLE FRACTURE SURGERY Right    benign tumor      removed from groin   COLONOSCOPY WITH PROPOFOL N/A 05/15/2022   Procedure: COLONOSCOPY WITH PROPOFOL;  Surgeon: Mauri Pole, MD;  Location: [facility];  Service: Gastroenterology;  Laterality: N/A;   HOT HEMOSTASIS N/A 05/15/2022   Procedure: HOT HEMOSTASIS (ARGON PLASMA COAGULATION/BICAP);  Surgeon:  Mauri Pole, MD;  Location: [facility];  Service: Gastroenterology;  Laterality: N/A;   POLYPECTOMY  05/15/2022   Procedure: POLYPECTOMY;  Surgeon: Mauri Pole, MD;  Location: [facility];  Service: Gastroenterology;;   WRIST ARTHROCENTESIS N/A 2001   Patient Active Problem List   Diagnosis Date Noted   Morbid obesity ([place]) 06/16/2022   Physical deconditioning 06/16/2022   AVM (arteriovenous malformation) of colon, acquired with hemorrhage    Positive colorectal cancer screening using Cologuard test    Polyp of sigmoid colon    Polyp of transverse colon    Polyp of cecum    COVID-19 virus infection 11/21/2021   Vitamin D deficiency 09/15/2020   Major depressive disorder 07/17/2020   Chronic urticaria 12/21/2019   Osteopenia of multiple sites 06/01/2019   Psychophysiological insomnia 03/29/2019   Centrilobular emphysema 02/04/2019   Chronic respiratory failure with hypoxia 02/04/2019   Chronic diastolic CHF (congestive heart failure) 01/28/2019   Essential hypertension 01/26/2019   Postinflammatory pulmonary fibrosis 09/15/2018   COPD (chronic obstructive pulmonary disease) 09/01/2018   DOE (dyspnea on exertion) 08/31/2018   Bilateral leg edema 03/31/2018   Generalized anxiety disorder with panic attacks 01/20/2018   Type 2 diabetes mellitus 02/09/2017   Back pain 10/18/2015   OSA (obstructive sleep apnea) 07/26/2015   Vertigo 03/29/2014   Dyshidrotic eczema 12/13/2010   Acquired hypothyroidism 10/01/2010   Mixed hyperlipidemia 07/20/2009   Allergic rhinitis 07/20/2009   Irritable bowel syndrome 07/13/2009   GERD (gastroesophageal reflux disease) 11/03/2007   Headache 11/03/2007    PCP: [name] MD   REFERRING PROVIDER: [name] MD   REFERRING DIAG:  Diagnosis  J96.11 (ICD-10-CM) - Chronic respiratory failure with hypoxia ([place])    THERAPY DIAG:  Other abnormalities of gait and mobility  Physical deconditioning  Rationale for Evaluation  and Treatment: Rehabilitation  ONSET DATE: has been on O2 for 3 years   SUBJECTIVE:   SUBJECTIVE STATEMENT: Pt reports increase in knee pain after last visit for ~2days.   PERTINENT HISTORY: Knees, Loweback, and wrist arthritis. CHF taking lasix, post inflamitory pulmonary fibrosis; vertigo ( still has syncope at times)  PAIN:  Are you having pain? Yes: NPRS scale: 5/10 Pain location: Left knee Pain description: Aching Aggravating factors: Standing walking Relieving factors: Sitting  PRECAUTIONS: falls   WEIGHT BEARING RESTRICTIONS: No  FALLS:  Has patient fallen in last 6 months? No but has ad some near falls   LIVING ENVIRONMENT: Ramp into the house   OCCUPATION: retired  Recreation: would like to be able to get back to gardening  PLOF: Independent with household mobility with device  PATIENT GOALS: To increase endurance  NEXT MD VISIT: Nothing scheduled  OBJECTIVE:   DIAGNOSTIC FINDINGS: Nothing of the knee  PATIENT SURVEYS:  FOTO deferred 2nd to time  COGNITION: Overall cognitive status: Within functional limits for tasks assessed     SENSATION: Has had tinglining in her hands. Has been using wrist guards  EDEMA:   MUSCLE LENGTH:  POSTURE: rounded shoulders, forward head, and flexed trunk   PALPATION: No unexpected tenderness palpation  LOWER EXTREMITY ROM:  Passive ROM Right eval Left eval  Hip flexion    Hip extension    Hip abduction    Hip adduction    Hip internal rotation    Hip external rotation    Knee flexion  Painful at end range  Knee extension    Ankle dorsiflexion    Ankle plantarflexion    Ankle inversion    Ankle eversion     (Blank rows = not tested)  LOWER EXTREMITY MMT:  MMT Right eval Left eval  Hip flexion 19.7 17.0  Hip extension    Hip abduction 36.9 40.3  Hip adduction    Hip internal rotation    Hip external rotation    Knee flexion    Knee extension 20 18.7  Ankle dorsiflexion    Ankle  plantarflexion    Ankle inversion    Ankle eversion     (Blank rows = not tested)  LOWER EXTREMITY SPECIAL TESTS:   IE: 5x sit to stand: 24 sec   Hr 85  Sao2 96   01/26/23: 1mn walk test: 2827ftotal- seated rest break at 12031fnd paused timer.  GAIT: Ambulated 130f16f at beginning of session. Seated rest break between. SpO2 was 90% afterwards. Rose to 97%  Ambulated 130ft25fat end of session. O2 remained at 97-98% this time.  TODAY'S TREATMENT:                                                                                                                               DATE: 2/26  -Gait- 2x130ft 56fnning of session, seated rest break between  2x130ft a67fd of session (3 min walk test)- seated rest break between. -Standing marching 2x10 -Standing heel raise 2x10 -partial squats- 2x10  -standing hip abduction- 2x10 ea -Standing hip ext- 2x10ea -Standing HSC- 2x10ea -Seated adductor squeeze- 5" x20   DATE: 2/19  -Gait- 2x130ft, s4fd rest break between  -Standing marching 2x10 -Standing heel raise 2x10 -partial squats- 2x10  -standing hip abduction- 2x10 ea -Standing hip ext- 2x10ea -Standing HSC- 2x10ea -Seated adductor squeeze- 5" x20   PATIENT EDUCATION:  Education details: HEP, symptom management, activity progression Person educated: Patient Education method: Explanation, Demonstration, Tactile cues, and Verbal cues Education comprehension: verbalized understanding, returned demonstration, verbal cues required, tactile cues required, and needs further education  HOME EXERCISE PROGRAM: Access Code: [email or web address] URL: [email or web address] Date: 01/19/2023 Prepared by: Kota Ciancio Sherlynn Carbonses - Heel Raises with Counter Support  - 1-2 x daily - 7 x weekly - 2 sets - 10 reps - Standing March with Counter Support  - 1-2 x daily - 7 x weekly - 2  sets - 10 reps - Standing Hip Abduction with Counter Support  - 1-2 x daily - 7 x weekly - 2 sets - 10  reps - Standing Hip Extension with Counter Support  - 1-2 x daily - 7 x weekly - 2 sets - 10 reps - Mini Squat with Counter Support  - 1-2 x daily - 7 x weekly - 2 sets - 10 reps - Standing Knee Flexion with Counter Support  - 1-2 x daily - 7 x weekly - 2 sets - 10 reps  ASSESSMENT:  CLINICAL IMPRESSION: SpO2 reduced to 90% after initial gait training. It took a period of 6 minutes to elevate to 97%, longer than previous session. This improved when repeated gait training later in session. Her SpO2 level became more consistent in normal ranges when she focused on inhalation during exercise. She does continue to require seated rest breaks between exercises. After initiation of Nu step for 5mns, HR was 79bpm   spO2% 96. Educated pt to remain aware of breathing techniques to maintain O2 level at appropriate range. Will continue to work on building endurance while maintaining appropriate O2 and HR levels. Will monitor for knee pain.  OBJECTIVE IMPAIRMENTS: Abnormal gait, cardiopulmonary status limiting activity, decreased activity tolerance, decreased balance, decreased endurance, decreased mobility, difficulty walking, decreased ROM, decreased strength, and pain.   ACTIVITY LIMITATIONS: carrying, lifting, bending, standing, squatting, stairs, transfers, bathing, dressing, and locomotion level  PARTICIPATION LIMITATIONS: meal prep, cleaning, laundry, driving, shopping, community activity, and yard work  PERSONAL FACTORS: 3+ comorbidities: Knees, Loweback, and wrist arthritis. CHF taking lasix, post inflamitory pulmonary fibrosis; vertigo ( still has syncope at times) are also affecting patient's functional outcome.   REHAB POTENTIAL: Good  CLINICAL DECISION MAKING: Evolving/moderate complexity declining overall mobility   EVALUATION COMPLEXITY: Moderate   GOALS: Goals reviewed with patient? Yes  SHORT TERM GOALS: Target date: 02/13/2023   Therapy will perform 6-minute walk test with patient  and come up with baseline goals Baseline: Goal status: MET 01/26/23  2.  Patient will decrease sit to stand time time by 5 seconds Baseline:  Goal status: INITIAL  3.  Patient will perform 10 minutes of interval training on NuStep while maintaining a heart rate less than 110 Baseline:  Goal status: INITIAL LONG TERM GOALS: Target date: 03/13/2023    Patient will have a complete exercise program that she can take to [employer] sneakers and continue long-term Baseline:  Goal status: INITIAL  2.  Patient will ambulate 600 feet without self-report of dyspnea in order to attend appointments without being short of breath Baseline:  Goal status: INITIAL  3.  Patient will stand for greater than 20 minutes without increased pain in left knee and increased dyspnea in order to perform ADLs Baseline:  Goal status: INITIAL    PLAN:  PT FREQUENCY: 2x/week  PT DURATION: 8 weeks  PLANNED INTERVENTIONS: Therapeutic exercises, Therapeutic activity, Neuromuscular re-education, Balance training, Gait training, Patient/Family education, Self Care, Joint mobilization, Stair training, DME instructions, Aquatic Therapy, Dry Needling, Electrical stimulation, Spinal mobilization, Cryotherapy, Moist heat, Ultrasound, and Manual therapy  PLAN FOR NEXT SESSION: 3 or 6 minute walk test. Begin interval training on the nu-step.    RGraceann CongressHodor, PTA 01/26/2023, 3:13 PM

## 2023-01-29 ENCOUNTER — Encounter: Payer: Self-pay | Admitting: Gastroenterology

## 2023-02-02 ENCOUNTER — Ambulatory Visit (HOSPITAL_BASED_OUTPATIENT_CLINIC_OR_DEPARTMENT_OTHER): Payer: PPO | Attending: Family Medicine

## 2023-02-02 ENCOUNTER — Encounter (HOSPITAL_BASED_OUTPATIENT_CLINIC_OR_DEPARTMENT_OTHER): Payer: Self-pay

## 2023-02-02 DIAGNOSIS — R2689 Other abnormalities of gait and mobility: Secondary | ICD-10-CM | POA: Insufficient documentation

## 2023-02-02 DIAGNOSIS — R5381 Other malaise: Secondary | ICD-10-CM | POA: Diagnosis not present

## 2023-02-02 NOTE — Therapy (Signed)
OUTPATIENT PHYSICAL THERAPY LOWER EXTREMITY EVALUATION   Patient Name: Bonnie Gonzalez MRN: [id number] DOB:07-09-1956, 67 y.o., female Today's Date: 02/02/2023  END OF SESSION:  PT End of Session - 02/02/23 1355     Visit Number 4    Number of Visits 16    Date for PT Re-Evaluation 03/12/23    PT Start Time [id number]    PT Stop Time 1335    PT Time Calculation (min) 33 min    Activity Tolerance Patient tolerated treatment well    Behavior During Therapy [facility] for tasks assessed/performed               Past Medical History:  Diagnosis Date   Acquired hypothyroidism 10/01/2010   Allergic rhinitis 07/20/2009   Arthritis    Back pain 10/18/2015   Bilateral leg edema 03/31/2018   Centrilobular emphysema 02/04/2019   HRCT 09/15/18   Chronic diastolic CHF (congestive heart failure) 01/28/2019   Chronic respiratory failure with hypoxia 02/04/2019   6 Min Walk 08/31/18 at [facility]   Chronic urticaria 12/21/2019   COPD (chronic obstructive pulmonary disease) 09/01/2018   Quit smoking 2010  - Spirometry 08/31/2018  FEV1 1.1 (51%)  Ratio 66 s prior rx with typical curvature  - 08/31/2018  After extensive coaching inhaler device,  effectiveness =    90% with elipta > try anoro sample > no better so d/c'  - PFT's  11/10/2018  FEV1 1.24 (59 % ) ratio 75 (ratio with fev1/VC =  63)  p no % improvement from saba p nothing prior to study with DLCO  105  % corrects to 132  %    DOE (dyspnea on exertion) 08/31/2018   Onset 05/2018 with background of unexplained leg swelling x 01/2018   -echo 04/13/18 just G1   diastolic dysfunction s PH   -  08/31/2018   Walked RA x one lap @ 185 stopped due to  Sob/ sats 89% at nl pace  - Spirometry 08/31/2018  FEV1 1.1 (51%)  Ratio 66 s prior rx  - 09/28/2018 could not do full pfts 09/28/2018  - Collagen vasc profile 09/28/18  For ESR =  54 (was 102 before prednisone)  Neg collag   Dyshidrotic eczema 12/13/2010   Essential hypertension 01/26/2019   Generalized  anxiety disorder with panic attacks 01/20/2018   GERD (gastroesophageal reflux disease) 11/03/2007   Headache 11/03/2007   Irritable bowel syndrome 07/13/2009   Major depressive disorder 07/17/2020   Mixed hyperlipidemia 07/20/2009   Obesity    OSA (obstructive sleep apnea) 07/26/2015   HST 08/2015 mild OSA, AHI 8/hour, lowest desaturation 81%, desaturations noted without respiratory events  Formatting of this note might be different from the original. PSG 05/20/19 AHI 25.1   Osteopenia of multiple sites 06/01/2019   DEXA 05/24/2019: Right femur neck T- 2.2, left femur neck T- 1.5, right total femur T -0.5, left total femur T -0.5, AP total spine T- 1.9. FRAX 10-year probability of major osteoporotic fracture is 8.8% and a hip fracture is 1.2%  DEXA 05/05/2019: Right femur neck T- 1.9, left femur neck T- 2.0, right total femur T -1.3, left total femur   Oxygen deficiency    Postinflammatory pulmonary fibrosis 09/15/2018   ESR    10/161/9  = 102   > rx short term prednisone ? slt improvement in symptoms HRCT 09/15/2018  1. Very mild basilar subpleural ground-glass, reticulation and traction bronchiolectasis, findings which may be minimally progressive from 06/29/2017 and are indicative of  interstitial lung disease such as nonspecific interstitial pneumonitis. Findings are indeterminate for UIP per consensus guidelin   Psychophysiological insomnia 03/29/2019   Type 2 diabetes mellitus 02/09/2017   UTI (urinary tract infection)    Vertigo 03/29/2014   Vitamin D deficiency 09/15/2020   Past Surgical History:  Procedure Laterality Date   ANKLE FRACTURE SURGERY Right    benign tumor      removed from groin   COLONOSCOPY WITH PROPOFOL N/A 05/15/2022   Procedure: COLONOSCOPY WITH PROPOFOL;  Surgeon: Mauri Pole, MD;  Location: [facility];  Service: Gastroenterology;  Laterality: N/A;   HOT HEMOSTASIS N/A 05/15/2022   Procedure: HOT HEMOSTASIS (ARGON PLASMA COAGULATION/BICAP);  Surgeon:  Mauri Pole, MD;  Location: [facility];  Service: Gastroenterology;  Laterality: N/A;   POLYPECTOMY  05/15/2022   Procedure: POLYPECTOMY;  Surgeon: Mauri Pole, MD;  Location: [facility];  Service: Gastroenterology;;   WRIST ARTHROCENTESIS N/A 2001   Patient Active Problem List   Diagnosis Date Noted   Morbid obesity ([place]) 06/16/2022   Physical deconditioning 06/16/2022   AVM (arteriovenous malformation) of colon, acquired with hemorrhage    Positive colorectal cancer screening using Cologuard test    Polyp of sigmoid colon    Polyp of transverse colon    Polyp of cecum    COVID-19 virus infection 11/21/2021   Vitamin D deficiency 09/15/2020   Major depressive disorder 07/17/2020   Chronic urticaria 12/21/2019   Osteopenia of multiple sites 06/01/2019   Psychophysiological insomnia 03/29/2019   Centrilobular emphysema 02/04/2019   Chronic respiratory failure with hypoxia 02/04/2019   Chronic diastolic CHF (congestive heart failure) 01/28/2019   Essential hypertension 01/26/2019   Postinflammatory pulmonary fibrosis 09/15/2018   COPD (chronic obstructive pulmonary disease) 09/01/2018   DOE (dyspnea on exertion) 08/31/2018   Bilateral leg edema 03/31/2018   Generalized anxiety disorder with panic attacks 01/20/2018   Type 2 diabetes mellitus 02/09/2017   Back pain 10/18/2015   OSA (obstructive sleep apnea) 07/26/2015   Vertigo 03/29/2014   Dyshidrotic eczema 12/13/2010   Acquired hypothyroidism 10/01/2010   Mixed hyperlipidemia 07/20/2009   Allergic rhinitis 07/20/2009   Irritable bowel syndrome 07/13/2009   GERD (gastroesophageal reflux disease) 11/03/2007   Headache 11/03/2007    PCP: [name] MD   REFERRING PROVIDER: [name] MD   REFERRING DIAG:  Diagnosis  J96.11 (ICD-10-CM) - Chronic respiratory failure with hypoxia ([place])    THERAPY DIAG:  Other abnormalities of gait and mobility  Physical deconditioning  Rationale for Evaluation  and Treatment: Rehabilitation  ONSET DATE: has been on O2 for 3 years   SUBJECTIVE:   SUBJECTIVE STATEMENT: Pt reports she is waiting on a new O2 refill and her current tank is about ~50% charged. Pt reports she walked a lot on Saturday at the [facility] which bothered her hips.   PERTINENT HISTORY: Knees, Loweback, and wrist arthritis. CHF taking lasix, post inflamitory pulmonary fibrosis; vertigo ( still has syncope at times)  PAIN:  Are you having pain? Yes: NPRS scale: 5/10 Pain location: Left knee Pain description: Aching Aggravating factors: Standing walking Relieving factors: Sitting  PRECAUTIONS: falls   WEIGHT BEARING RESTRICTIONS: No  FALLS:  Has patient fallen in last 6 months? No but has ad some near falls   LIVING ENVIRONMENT: Ramp into the house   OCCUPATION: retired  Recreation: would like to be able to get back to gardening  PLOF: Independent with household mobility with device  PATIENT GOALS: To increase endurance  NEXT MD  VISIT: Nothing scheduled  OBJECTIVE:   DIAGNOSTIC FINDINGS: Nothing of the knee  PATIENT SURVEYS:  FOTO deferred 2nd to time   COGNITION: Overall cognitive status: Within functional limits for tasks assessed     SENSATION: Has had tinglining in her hands. Has been using wrist guards  EDEMA:   MUSCLE LENGTH:  POSTURE: rounded shoulders, forward head, and flexed trunk   PALPATION: No unexpected tenderness palpation  LOWER EXTREMITY ROM:  Passive ROM Right eval Left eval  Hip flexion    Hip extension    Hip abduction    Hip adduction    Hip internal rotation    Hip external rotation    Knee flexion  Painful at end range  Knee extension    Ankle dorsiflexion    Ankle plantarflexion    Ankle inversion    Ankle eversion     (Blank rows = not tested)  LOWER EXTREMITY MMT:  MMT Right eval Left eval  Hip flexion 19.7 17.0  Hip extension    Hip abduction 36.9 40.3  Hip adduction    Hip internal rotation     Hip external rotation    Knee flexion    Knee extension 20 18.7  Ankle dorsiflexion    Ankle plantarflexion    Ankle inversion    Ankle eversion     (Blank rows = not tested)  LOWER EXTREMITY SPECIAL TESTS:   IE: 5x sit to stand: 24 sec   Hr 85  Sao2 96   01/26/23: 86mn walk test: 2816ftotal- seated rest break at 12041fnd paused timer.  GAIT: Ambulated 130f55f at beginning of session. Seated rest break between. SpO2 was 90% afterwards. Rose to 97%  Ambulated 130ft30fat end of session. O2 remained at 97-98% this time.  TODAY'S TREATMENT:                                                                                                                               DATE: 3/4  Nu step L3 x5min 47mait- 2x130ft b58fning of session, seated rest break between   -Standing marching 2x10 -Standing heel raise 2x10 -partial squats- 2x10  -standing hip abduction- 2x10 ea -Standing hip ext- 2x10ea -Standing HSC- 2x10ea  DATE: 2/26  -Gait- 2x130ft be52fing of session, seated rest break between  2x130ft at 52fof session (3 min walk test)- seated rest break between. -Standing marching 2x10 -Standing heel raise 2x10 -partial squats- 2x10  -standing hip abduction- 2x10 ea -Standing hip ext- 2x10ea -Standing HSC- 2x10ea -Seated adductor squeeze- 5" x20   DATE: 2/19  -Gait- 2x130ft, sea70frest break between  -Standing marching 2x10 -Standing heel raise 2x10 -partial squats- 2x10  -standing hip abduction- 2x10 ea -Standing hip ext- 2x10ea -Standing HSC- 2x10ea -Seated adductor squeeze- 5" x20   PATIENT EDUCATION:  Education details: HEP, symptom management, activity progression Person educated: Patient Education method: Explanation, Demonstration, Tactile cues, and Verbal cues Education comprehension: verbalized understanding,  returned demonstration, verbal cues required, tactile cues required, and needs further education  HOME EXERCISE PROGRAM: Access Code:  [email or web address] URL: [email or web address] Date: 01/19/2023 Prepared by: Sherlynn Carbon  Exercises - Heel Raises with Counter Support  - 1-2 x daily - 7 x weekly - 2 sets - 10 reps - Standing March with Counter Support  - 1-2 x daily - 7 x weekly - 2 sets - 10 reps - Standing Hip Abduction with Counter Support  - 1-2 x daily - 7 x weekly - 2 sets - 10 reps - Standing Hip Extension with Counter Support  - 1-2 x daily - 7 x weekly - 2 sets - 10 reps - Mini Squat with Counter Support  - 1-2 x daily - 7 x weekly - 2 sets - 10 reps - Standing Knee Flexion with Counter Support  - 1-2 x daily - 7 x weekly - 2 sets - 10 reps  ASSESSMENT:  CLINICAL IMPRESSION: SpO2 reduced to 91% after first 142f. This did require a few minutes to climb back to 97%. She has improving self awareness of breathing, though this does decrease when she is distracted. Her SpO2 level is more consistently >96% with standing exercises vs. Walking. She does continue to require seated rest breaks between exercises. After Nu step for 560ms, HR was 95bpm   spO2% 94. Educated pt to remain aware of breathing techniques to maintain O2 level at appropriate range. Will continue to work on building endurance while maintaining appropriate O2 and HR levels. Ended session 10 minutes early due to O2 device being low on battery.   OBJECTIVE IMPAIRMENTS: Abnormal gait, cardiopulmonary status limiting activity, decreased activity tolerance, decreased balance, decreased endurance, decreased mobility, difficulty walking, decreased ROM, decreased strength, and pain.   ACTIVITY LIMITATIONS: carrying, lifting, bending, standing, squatting, stairs, transfers, bathing, dressing, and locomotion level  PARTICIPATION LIMITATIONS: meal prep, cleaning, laundry, driving, shopping, community activity, and yard work  PERSONAL FACTORS: 3+ comorbidities: Knees, Loweback, and wrist arthritis. CHF taking lasix, post inflamitory pulmonary fibrosis; vertigo  ( still has syncope at times) are also affecting patient's functional outcome.   REHAB POTENTIAL: Good  CLINICAL DECISION MAKING: Evolving/moderate complexity declining overall mobility   EVALUATION COMPLEXITY: Moderate   GOALS: Goals reviewed with patient? Yes  SHORT TERM GOALS: Target date: 02/13/2023   Therapy will perform 6-minute walk test with patient and come up with baseline goals Baseline: Goal status: MET 01/26/23  2.  Patient will decrease sit to stand time time by 5 seconds Baseline:  Goal status: INITIAL  3.  Patient will perform 10 minutes of interval training on NuStep while maintaining a heart rate less than 110 Baseline:  Goal status: INITIAL LONG TERM GOALS: Target date: 03/13/2023    Patient will have a complete exercise program that she can take to Silver sneakers and continue long-term Baseline:  Goal status: INITIAL  2.  Patient will ambulate 600 feet without self-report of dyspnea in order to attend appointments without being short of breath Baseline:  Goal status: INITIAL  3.  Patient will stand for greater than 20 minutes without increased pain in left knee and increased dyspnea in order to perform ADLs Baseline:  Goal status: INITIAL    PLAN:  PT FREQUENCY: 2x/week  PT DURATION: 8 weeks  PLANNED INTERVENTIONS: Therapeutic exercises, Therapeutic activity, Neuromuscular re-education, Balance training, Gait training, Patient/Family education, Self Care, Joint mobilization, Stair training, DME instructions, Aquatic Therapy, Dry Needling, Electrical stimulation, Spinal mobilization, Cryotherapy, Moist heat, Ultrasound, and Manual therapy  PLAN FOR NEXT SESSION: 3 or 6 minute walk test. Begin interval training on the nu-step.    Graceann Congress Marijane Trower, PTA 02/02/2023, 1:57 PM

## 2023-02-04 ENCOUNTER — Ambulatory Visit (HOSPITAL_BASED_OUTPATIENT_CLINIC_OR_DEPARTMENT_OTHER): Payer: PPO | Admitting: Physical Therapy

## 2023-02-04 DIAGNOSIS — R5381 Other malaise: Secondary | ICD-10-CM

## 2023-02-04 DIAGNOSIS — R2689 Other abnormalities of gait and mobility: Secondary | ICD-10-CM

## 2023-02-04 NOTE — Therapy (Signed)
OUTPATIENT PHYSICAL THERAPY LOWER EXTREMITY treatment    Patient Name: Bonnie Gonzalez MRN: [id number] DOB:12-03-1955, 67 y.o., female Today's Date: 02/02/2023  END OF SESSION:  PT End of Session - 02/02/23 1355     Visit Number 4    Number of Visits 16    Date for PT Re-Evaluation 03/12/23    PT Start Time [id number]    PT Stop Time 1335    PT Time Calculation (min) 33 min    Activity Tolerance Patient tolerated treatment well    Behavior During Therapy [facility] for tasks assessed/performed               Past Medical History:  Diagnosis Date   Acquired hypothyroidism 10/01/2010   Allergic rhinitis 07/20/2009   Arthritis    Back pain 10/18/2015   Bilateral leg edema 03/31/2018   Centrilobular emphysema 02/04/2019   HRCT 09/15/18   Chronic diastolic CHF (congestive heart failure) 01/28/2019   Chronic respiratory failure with hypoxia 02/04/2019   6 Min Walk 08/31/18 at [facility]   Chronic urticaria 12/21/2019   COPD (chronic obstructive pulmonary disease) 09/01/2018   Quit smoking 2010  - Spirometry 08/31/2018  FEV1 1.1 (51%)  Ratio 66 s prior rx with typical curvature  - 08/31/2018  After extensive coaching inhaler device,  effectiveness =    90% with elipta > try anoro sample > no better so d/c'  - PFT's  11/10/2018  FEV1 1.24 (59 % ) ratio 75 (ratio with fev1/VC =  63)  p no % improvement from saba p nothing prior to study with DLCO  105  % corrects to 132  %    DOE (dyspnea on exertion) 08/31/2018   Onset 05/2018 with background of unexplained leg swelling x 01/2018   -echo 04/13/18 just G1   diastolic dysfunction s PH   -  08/31/2018   Walked RA x one lap @ 185 stopped due to  Sob/ sats 89% at nl pace  - Spirometry 08/31/2018  FEV1 1.1 (51%)  Ratio 66 s prior rx  - 09/28/2018 could not do full pfts 09/28/2018  - Collagen vasc profile 09/28/18  For ESR =  54 (was 102 before prednisone)  Neg collag   Dyshidrotic eczema 12/13/2010   Essential hypertension 01/26/2019   Generalized  anxiety disorder with panic attacks 01/20/2018   GERD (gastroesophageal reflux disease) 11/03/2007   Headache 11/03/2007   Irritable bowel syndrome 07/13/2009   Major depressive disorder 07/17/2020   Mixed hyperlipidemia 07/20/2009   Obesity    OSA (obstructive sleep apnea) 07/26/2015   HST 08/2015 mild OSA, AHI 8/hour, lowest desaturation 81%, desaturations noted without respiratory events  Formatting of this note might be different from the original. PSG 05/20/19 AHI 25.1   Osteopenia of multiple sites 06/01/2019   DEXA 05/24/2019: Right femur neck T- 2.2, left femur neck T- 1.5, right total femur T -0.5, left total femur T -0.5, AP total spine T- 1.9. FRAX 10-year probability of major osteoporotic fracture is 8.8% and a hip fracture is 1.2%  DEXA 05/05/2019: Right femur neck T- 1.9, left femur neck T- 2.0, right total femur T -1.3, left total femur   Oxygen deficiency    Postinflammatory pulmonary fibrosis 09/15/2018   ESR    10/161/9  = 102   > rx short term prednisone ? slt improvement in symptoms HRCT 09/15/2018  1. Very mild basilar subpleural ground-glass, reticulation and traction bronchiolectasis, findings which may be minimally progressive from 06/29/2017 and are indicative  of interstitial lung disease such as nonspecific interstitial pneumonitis. Findings are indeterminate for UIP per consensus guidelin   Psychophysiological insomnia 03/29/2019   Type 2 diabetes mellitus 02/09/2017   UTI (urinary tract infection)    Vertigo 03/29/2014   Vitamin D deficiency 09/15/2020   Past Surgical History:  Procedure Laterality Date   ANKLE FRACTURE SURGERY Right    benign tumor      removed from groin   COLONOSCOPY WITH PROPOFOL N/A 05/15/2022   Procedure: COLONOSCOPY WITH PROPOFOL;  Surgeon: Mauri Pole, MD;  Location: [facility];  Service: Gastroenterology;  Laterality: N/A;   HOT HEMOSTASIS N/A 05/15/2022   Procedure: HOT HEMOSTASIS (ARGON PLASMA COAGULATION/BICAP);  Surgeon:  Mauri Pole, MD;  Location: [facility];  Service: Gastroenterology;  Laterality: N/A;   POLYPECTOMY  05/15/2022   Procedure: POLYPECTOMY;  Surgeon: Mauri Pole, MD;  Location: [facility];  Service: Gastroenterology;;   WRIST ARTHROCENTESIS N/A 2001   Patient Active Problem List   Diagnosis Date Noted   Morbid obesity ([place]) 06/16/2022   Physical deconditioning 06/16/2022   AVM (arteriovenous malformation) of colon, acquired with hemorrhage    Positive colorectal cancer screening using Cologuard test    Polyp of sigmoid colon    Polyp of transverse colon    Polyp of cecum    COVID-19 virus infection 11/21/2021   Vitamin D deficiency 09/15/2020   Major depressive disorder 07/17/2020   Chronic urticaria 12/21/2019   Osteopenia of multiple sites 06/01/2019   Psychophysiological insomnia 03/29/2019   Centrilobular emphysema 02/04/2019   Chronic respiratory failure with hypoxia 02/04/2019   Chronic diastolic CHF (congestive heart failure) 01/28/2019   Essential hypertension 01/26/2019   Postinflammatory pulmonary fibrosis 09/15/2018   COPD (chronic obstructive pulmonary disease) 09/01/2018   DOE (dyspnea on exertion) 08/31/2018   Bilateral leg edema 03/31/2018   Generalized anxiety disorder with panic attacks 01/20/2018   Type 2 diabetes mellitus 02/09/2017   Back pain 10/18/2015   OSA (obstructive sleep apnea) 07/26/2015   Vertigo 03/29/2014   Dyshidrotic eczema 12/13/2010   Acquired hypothyroidism 10/01/2010   Mixed hyperlipidemia 07/20/2009   Allergic rhinitis 07/20/2009   Irritable bowel syndrome 07/13/2009   GERD (gastroesophageal reflux disease) 11/03/2007   Headache 11/03/2007    PCP: [name] MD   REFERRING PROVIDER: [name] MD   REFERRING DIAG:  Diagnosis  J96.11 (ICD-10-CM) - Chronic respiratory failure with hypoxia ([place])    THERAPY DIAG:  Other abnormalities of gait and mobility  Physical deconditioning  Rationale for Evaluation  and Treatment: Rehabilitation  ONSET DATE: has been on O2 for 3 years   SUBJECTIVE:   SUBJECTIVE STATEMENT: The patient has fixed her oxygen machine. She is breathing better. Sh ehas minor pain in the back of her knees.   PERTINENT HISTORY: Knees, Loweback, and wrist arthritis. CHF taking lasix, post inflamitory pulmonary fibrosis; vertigo ( still has syncope at times)  PAIN:  Are you having pain? Yes: NPRS scale: 5/10 Pain location: Left knee Pain description: Aching Aggravating factors: Standing walking Relieving factors: Sitting  PRECAUTIONS: falls   WEIGHT BEARING RESTRICTIONS: No  FALLS:  Has patient fallen in last 6 months? No but has ad some near falls   LIVING ENVIRONMENT: Ramp into the house   OCCUPATION: retired  Recreation: would like to be able to get back to gardening  PLOF: Independent with household mobility with device  PATIENT GOALS: To increase endurance  NEXT MD VISIT: Nothing scheduled  OBJECTIVE:   DIAGNOSTIC FINDINGS: Nothing of  the knee  PATIENT SURVEYS:  FOTO deferred 2nd to time   COGNITION: Overall cognitive status: Within functional limits for tasks assessed     SENSATION: Has had tinglining in her hands. Has been using wrist guards  EDEMA:   MUSCLE LENGTH:  POSTURE: rounded shoulders, forward head, and flexed trunk   PALPATION: No unexpected tenderness palpation  LOWER EXTREMITY ROM:  Passive ROM Right eval Left eval  Hip flexion    Hip extension    Hip abduction    Hip adduction    Hip internal rotation    Hip external rotation    Knee flexion  Painful at end range  Knee extension    Ankle dorsiflexion    Ankle plantarflexion    Ankle inversion    Ankle eversion     (Blank rows = not tested)  LOWER EXTREMITY MMT:  MMT Right eval Left eval  Hip flexion 19.7 17.0  Hip extension    Hip abduction 36.9 40.3  Hip adduction    Hip internal rotation    Hip external rotation    Knee flexion    Knee extension  20 18.7  Ankle dorsiflexion    Ankle plantarflexion    Ankle inversion    Ankle eversion     (Blank rows = not tested)  LOWER EXTREMITY SPECIAL TESTS:   IE: 5x sit to stand: 24 sec   Hr 85  Sao2 96   01/26/23: 91mn walk test: 2835ftotal- seated rest break at 12061fnd paused timer.  GAIT: Ambulated 130f72f at beginning of session. Seated rest break between. SpO2 was 90% afterwards. Rose to 97%  Ambulated 130ft72fat end of session. O2 remained at 97-98% this time.  TODAY'S TREATMENT:                                                                                                                               DATE:  3/6 Nu-step L3 5:15 368 steps  Baseline Sao2 91%  After 93%    Bilateral er 2x10 yellow  Bilateral horizontal abduction 2x10 yellow  Bilateral flexion  2x10 yellow band    Seated hip abduction 2x10  Seated March 2x10 yellow  Seated LAQ 2x10 each leg yellow  Ball squeeze 2x10   The patients Sao2 was checked frequently and ranged between 88 and 98       3/4  Nu step L3 x5min 38mait- 2x130ft b54fning of session, seated rest break between   -Standing marching 2x10 -Standing heel raise 2x10 -partial squats- 2x10  -standing hip abduction- 2x10 ea -Standing hip ext- 2x10ea -Standing HSC- 2x10ea  DATE: 2/26  -Gait- 2x130ft be30fing of session, seated rest break between  2x130ft at 26fof session (3 min walk test)- seated rest break between. -Standing marching 2x10 -Standing heel raise 2x10 -partial squats- 2x10  -standing hip abduction- 2x10 ea -Standing hip ext- 2x10ea -Standing HSC- 2x10ea -Seated adductor squeeze- 5" x20  DATE: 2/19  -Gait- 2x152f, seated rest break between  -Standing marching 2x10 -Standing heel raise 2x10 -partial squats- 2x10  -standing hip abduction- 2x10 ea -Standing hip ext- 2x10ea -Standing HSC- 2x10ea -Seated adductor squeeze- 5" x20   PATIENT EDUCATION:  Education details: HEP, symptom management,  activity progression Person educated: Patient Education method: Explanation, Demonstration, Tactile cues, and Verbal cues Education comprehension: verbalized understanding, returned demonstration, verbal cues required, tactile cues required, and needs further education  HOME EXERCISE PROGRAM: Access Code: [email or web address] URL: [email or web address] Date: 01/19/2023 Prepared by: RSherlynn Carbon Exercises - Heel Raises with Counter Support  - 1-2 x daily - 7 x weekly - 2 sets - 10 reps - Standing March with Counter Support  - 1-2 x daily - 7 x weekly - 2 sets - 10 reps - Standing Hip Abduction with Counter Support  - 1-2 x daily - 7 x weekly - 2 sets - 10 reps - Standing Hip Extension with Counter Support  - 1-2 x daily - 7 x weekly - 2 sets - 10 reps - Mini Squat with Counter Support  - 1-2 x daily - 7 x weekly - 2 sets - 10 reps - Standing Knee Flexion with Counter Support  - 1-2 x daily - 7 x weekly - 2 sets - 10 reps  ASSESSMENT:  CLINICAL IMPRESSION: Th patient tolerated treatment well. We reviewed a seated UE and LE series that we added to her HEP. She tolerated well ut was somewhat fatigued at the end.    OBJECTIVE IMPAIRMENTS: Abnormal gait, cardiopulmonary status limiting activity, decreased activity tolerance, decreased balance, decreased endurance, decreased mobility, difficulty walking, decreased ROM, decreased strength, and pain.   ACTIVITY LIMITATIONS: carrying, lifting, bending, standing, squatting, stairs, transfers, bathing, dressing, and locomotion level  PARTICIPATION LIMITATIONS: meal prep, cleaning, laundry, driving, shopping, community activity, and yard work  PERSONAL FACTORS: 3+ comorbidities: Knees, Loweback, and wrist arthritis. CHF taking lasix, post inflamitory pulmonary fibrosis; vertigo ( still has syncope at times) are also affecting patient's functional outcome.   REHAB POTENTIAL: Good  CLINICAL DECISION MAKING: Evolving/moderate complexity  declining overall mobility   EVALUATION COMPLEXITY: Moderate   GOALS: Goals reviewed with patient? Yes  SHORT TERM GOALS: Target date: 02/13/2023   Therapy will perform 6-minute walk test with patient and come up with baseline goals Baseline: Goal status: MET 01/26/23  2.  Patient will decrease sit to stand time time by 5 seconds Baseline:  Goal status: INITIAL  3.  Patient will perform 10 minutes of interval training on NuStep while maintaining a heart rate less than 110 Baseline:  Goal status: INITIAL LONG TERM GOALS: Target date: 03/13/2023    Patient will have a complete exercise program that she can take to Silver sneakers and continue long-term Baseline:  Goal status: INITIAL  2.  Patient will ambulate 600 feet without self-report of dyspnea in order to attend appointments without being short of breath Baseline:  Goal status: INITIAL  3.  Patient will stand for greater than 20 minutes without increased pain in left knee and increased dyspnea in order to perform ADLs Baseline:  Goal status: INITIAL    PLAN:  PT FREQUENCY: 2x/week  PT DURATION: 8 weeks  PLANNED INTERVENTIONS: Therapeutic exercises, Therapeutic activity, Neuromuscular re-education, Balance training, Gait training, Patient/Family education, Self Care, Joint mobilization, Stair training, DME instructions, Aquatic Therapy, Dry Needling, Electrical stimulation, Spinal mobilization, Cryotherapy, Moist heat, Ultrasound, and Manual therapy  PLAN FOR NEXT SESSION: 3 or 6 minute walk test. Begin interval  training on the nu-step.    Graceann Congress Hodor, PTA 02/02/2023, 1:57 PM

## 2023-02-07 ENCOUNTER — Other Ambulatory Visit: Payer: Self-pay | Admitting: Family Medicine

## 2023-02-08 DIAGNOSIS — J449 Chronic obstructive pulmonary disease, unspecified: Secondary | ICD-10-CM | POA: Diagnosis not present

## 2023-02-08 DIAGNOSIS — I5032 Chronic diastolic (congestive) heart failure: Secondary | ICD-10-CM | POA: Diagnosis not present

## 2023-02-10 ENCOUNTER — Ambulatory Visit (HOSPITAL_BASED_OUTPATIENT_CLINIC_OR_DEPARTMENT_OTHER): Payer: PPO

## 2023-02-10 ENCOUNTER — Encounter (HOSPITAL_BASED_OUTPATIENT_CLINIC_OR_DEPARTMENT_OTHER): Payer: Self-pay

## 2023-02-10 DIAGNOSIS — R5381 Other malaise: Secondary | ICD-10-CM

## 2023-02-10 DIAGNOSIS — R2689 Other abnormalities of gait and mobility: Secondary | ICD-10-CM | POA: Diagnosis not present

## 2023-02-10 NOTE — Therapy (Signed)
OUTPATIENT PHYSICAL THERAPY LOWER EXTREMITY treatment    Patient Name: Bonnie Gonzalez MRN: [id number] DOB:29-Mar-1956, 67 y.o., female Today's Date: 02/10/2023  END OF SESSION:  PT End of Session - 02/10/23 1151     Visit Number 6    Number of Visits 16    Date for PT Re-Evaluation 03/12/23    PT Start Time 1147    PT Stop Time 1230    PT Time Calculation (min) 43 min    Activity Tolerance Patient tolerated treatment well    Behavior During Therapy [facility] for tasks assessed/performed               Past Medical History:  Diagnosis Date   Acquired hypothyroidism 10/01/2010   Allergic rhinitis 07/20/2009   Arthritis    Back pain 10/18/2015   Bilateral leg edema 03/31/2018   Centrilobular emphysema 02/04/2019   HRCT 09/15/18   Chronic diastolic CHF (congestive heart failure) 01/28/2019   Chronic respiratory failure with hypoxia 02/04/2019   6 Min Walk 08/31/18 at [facility]   Chronic urticaria 12/21/2019   COPD (chronic obstructive pulmonary disease) 09/01/2018   Quit smoking 2010  - Spirometry 08/31/2018  FEV1 1.1 (51%)  Ratio 66 s prior rx with typical curvature  - 08/31/2018  After extensive coaching inhaler device,  effectiveness =    90% with elipta > try anoro sample > no better so d/c'  - PFT's  11/10/2018  FEV1 1.24 (59 % ) ratio 75 (ratio with fev1/VC =  63)  p no % improvement from saba p nothing prior to study with DLCO  105  % corrects to 132  %    DOE (dyspnea on exertion) 08/31/2018   Onset 05/2018 with background of unexplained leg swelling x 01/2018   -echo 04/13/18 just G1   diastolic dysfunction s PH   -  08/31/2018   Walked RA x one lap @ 185 stopped due to  Sob/ sats 89% at nl pace  - Spirometry 08/31/2018  FEV1 1.1 (51%)  Ratio 66 s prior rx  - 09/28/2018 could not do full pfts 09/28/2018  - Collagen vasc profile 09/28/18  For ESR =  54 (was 102 before prednisone)  Neg collag   Dyshidrotic eczema 12/13/2010   Essential hypertension 01/26/2019   Generalized  anxiety disorder with panic attacks 01/20/2018   GERD (gastroesophageal reflux disease) 11/03/2007   Headache 11/03/2007   Irritable bowel syndrome 07/13/2009   Major depressive disorder 07/17/2020   Mixed hyperlipidemia 07/20/2009   Obesity    OSA (obstructive sleep apnea) 07/26/2015   HST 08/2015 mild OSA, AHI 8/hour, lowest desaturation 81%, desaturations noted without respiratory events  Formatting of this note might be different from the original. PSG 05/20/19 AHI 25.1   Osteopenia of multiple sites 06/01/2019   DEXA 05/24/2019: Right femur neck T- 2.2, left femur neck T- 1.5, right total femur T -0.5, left total femur T -0.5, AP total spine T- 1.9. FRAX 10-year probability of major osteoporotic fracture is 8.8% and a hip fracture is 1.2%  DEXA 05/05/2019: Right femur neck T- 1.9, left femur neck T- 2.0, right total femur T -1.3, left total femur   Oxygen deficiency    Postinflammatory pulmonary fibrosis 09/15/2018   ESR    10/161/9  = 102   > rx short term prednisone ? slt improvement in symptoms HRCT 09/15/2018  1. Very mild basilar subpleural ground-glass, reticulation and traction bronchiolectasis, findings which may be minimally progressive from 06/29/2017 and are indicative  of interstitial lung disease such as nonspecific interstitial pneumonitis. Findings are indeterminate for UIP per consensus guidelin   Psychophysiological insomnia 03/29/2019   Type 2 diabetes mellitus 02/09/2017   UTI (urinary tract infection)    Vertigo 03/29/2014   Vitamin D deficiency 09/15/2020   Past Surgical History:  Procedure Laterality Date   ANKLE FRACTURE SURGERY Right    benign tumor      removed from groin   COLONOSCOPY WITH PROPOFOL N/A 05/15/2022   Procedure: COLONOSCOPY WITH PROPOFOL;  Surgeon: Mauri Pole, MD;  Location: [facility];  Service: Gastroenterology;  Laterality: N/A;   HOT HEMOSTASIS N/A 05/15/2022   Procedure: HOT HEMOSTASIS (ARGON PLASMA COAGULATION/BICAP);  Surgeon:  Mauri Pole, MD;  Location: [facility];  Service: Gastroenterology;  Laterality: N/A;   POLYPECTOMY  05/15/2022   Procedure: POLYPECTOMY;  Surgeon: Mauri Pole, MD;  Location: [facility];  Service: Gastroenterology;;   WRIST ARTHROCENTESIS N/A 2001   Patient Active Problem List   Diagnosis Date Noted   Morbid obesity ([place]) 06/16/2022   Physical deconditioning 06/16/2022   AVM (arteriovenous malformation) of colon, acquired with hemorrhage    Positive colorectal cancer screening using Cologuard test    Polyp of sigmoid colon    Polyp of transverse colon    Polyp of cecum    COVID-19 virus infection 11/21/2021   Vitamin D deficiency 09/15/2020   Major depressive disorder 07/17/2020   Chronic urticaria 12/21/2019   Osteopenia of multiple sites 06/01/2019   Psychophysiological insomnia 03/29/2019   Centrilobular emphysema 02/04/2019   Chronic respiratory failure with hypoxia 02/04/2019   Chronic diastolic CHF (congestive heart failure) 01/28/2019   Essential hypertension 01/26/2019   Postinflammatory pulmonary fibrosis 09/15/2018   COPD (chronic obstructive pulmonary disease) 09/01/2018   DOE (dyspnea on exertion) 08/31/2018   Bilateral leg edema 03/31/2018   Generalized anxiety disorder with panic attacks 01/20/2018   Type 2 diabetes mellitus 02/09/2017   Back pain 10/18/2015   OSA (obstructive sleep apnea) 07/26/2015   Vertigo 03/29/2014   Dyshidrotic eczema 12/13/2010   Acquired hypothyroidism 10/01/2010   Mixed hyperlipidemia 07/20/2009   Allergic rhinitis 07/20/2009   Irritable bowel syndrome 07/13/2009   GERD (gastroesophageal reflux disease) 11/03/2007   Headache 11/03/2007    PCP: [name] MD   REFERRING PROVIDER: [name] MD   REFERRING DIAG:  Diagnosis  J96.11 (ICD-10-CM) - Chronic respiratory failure with hypoxia ([place])    THERAPY DIAG:  Other abnormalities of gait and mobility  Physical deconditioning  Rationale for Evaluation  and Treatment: Rehabilitation  ONSET DATE: has been on O2 for 3 years   SUBJECTIVE:   SUBJECTIVE STATEMENT: Pt reports she will be having gel injection tomorrow into L knee. States she has been able to walk into medical offices using rollator as opposed to w/c.   PERTINENT HISTORY: Knees, Loweback, and wrist arthritis. CHF taking lasix, post inflamitory pulmonary fibrosis; vertigo ( still has syncope at times)  PAIN:  Are you having pain? Yes: NPRS scale: 5/10 Pain location: Left knee Pain description: Aching Aggravating factors: Standing walking Relieving factors: Sitting  PRECAUTIONS: falls   WEIGHT BEARING RESTRICTIONS: No  FALLS:  Has patient fallen in last 6 months? No but has ad some near falls   LIVING ENVIRONMENT: Ramp into the house   OCCUPATION: retired  Recreation: would like to be able to get back to gardening  PLOF: Independent with household mobility with device  PATIENT GOALS: To increase endurance  NEXT MD VISIT: Nothing scheduled  OBJECTIVE:   DIAGNOSTIC FINDINGS: Nothing of the knee  PATIENT SURVEYS:  FOTO deferred 2nd to time   COGNITION: Overall cognitive status: Within functional limits for tasks assessed     SENSATION: Has had tinglining in her hands. Has been using wrist guards  EDEMA:   MUSCLE LENGTH:  POSTURE: rounded shoulders, forward head, and flexed trunk   PALPATION: No unexpected tenderness palpation  LOWER EXTREMITY ROM:  Passive ROM Right eval Left eval  Hip flexion    Hip extension    Hip abduction    Hip adduction    Hip internal rotation    Hip external rotation    Knee flexion  Painful at end range  Knee extension    Ankle dorsiflexion    Ankle plantarflexion    Ankle inversion    Ankle eversion     (Blank rows = not tested)  LOWER EXTREMITY MMT:  MMT Right eval Left eval  Hip flexion 19.7 17.0  Hip extension    Hip abduction 36.9 40.3  Hip adduction    Hip internal rotation    Hip external  rotation    Knee flexion    Knee extension 20 18.7  Ankle dorsiflexion    Ankle plantarflexion    Ankle inversion    Ankle eversion     (Blank rows = not tested)  LOWER EXTREMITY SPECIAL TESTS:   IE: 5x sit to stand: 24 sec   Hr 85  Sao2 96   01/26/23: 13mn walk test: 2836ftotal- seated rest break at 12065fnd paused timer.  GAIT: Ambulated 130f23f at beginning of session. Seated rest break between. SpO2 was 90% afterwards. Rose to 97%  Ambulated 130ft41fat end of session. O2 remained at 97-98% this time.  TODAY'S TREATMENT:                                                                                                                               DATE:   3/12 Nu-step L3 6min 76mer 89%  -Gait- 4x130ft b76fning of session, seated rest break between (3L of O2 for walking)   Bilateral er 2x10 yellow  Bilateral horizontal abduction 2x10 yellow   Seated hip abduction yellow  2x10- feet on step Seated March 2x10 yellow    -Standing marching 2x10 -Standing heel raise 2x10 -partial squats- unable due to L knee  The patients Sao2 was checked frequently and ranged between 89 and 99%   3/6 Nu-step L3 5:15 368 steps  Baseline Sao2 91%  After 93%    Bilateral er 2x10 yellow  Bilateral horizontal abduction 2x10 yellow  Bilateral flexion  2x10 yellow band    Seated hip abduction 2x10  Seated March 2x10 yellow  Seated LAQ 2x10 each leg yellow  Ball squeeze 2x10   The patients Sao2 was checked frequently and ranged between 88 and 98       3/4  Nu step L3 x5min  -72mt- 2x130ft beg50fng  of session, seated rest break between   -Standing marching 2x10 -Standing heel raise 2x10 -partial squats- 2x10  -standing hip abduction- 2x10 ea -Standing hip ext- 2x10ea -Standing HSC- 2x10ea  DATE: 2/26  -Gait- 2x127f beginning of session, seated rest break between  2x1386fat end of session (3 min walk test)- seated rest break between. -Standing marching  2x10 -Standing heel raise 2x10 -partial squats- 2x10  -standing hip abduction- 2x10 ea -Standing hip ext- 2x10ea -Standing HSC- 2x10ea -Seated adductor squeeze- 5" x20    PATIENT EDUCATION:  Education details: HEP, symptom management, activity progression Person educated: Patient Education method: Explanation, Demonstration, Tactile cues, and Verbal cues Education comprehension: verbalized understanding, returned demonstration, verbal cues required, tactile cues required, and needs further education  HOME EXERCISE PROGRAM: Access Code: [email or web address] URL: [email or web address] Date: 01/19/2023 Prepared by: ReSherlynn CarbonExercises - Heel Raises with Counter Support  - 1-2 x daily - 7 x weekly - 2 sets - 10 reps - Standing March with Counter Support  - 1-2 x daily - 7 x weekly - 2 sets - 10 reps - Standing Hip Abduction with Counter Support  - 1-2 x daily - 7 x weekly - 2 sets - 10 reps - Standing Hip Extension with Counter Support  - 1-2 x daily - 7 x weekly - 2 sets - 10 reps - Mini Squat with Counter Support  - 1-2 x daily - 7 x weekly - 2 sets - 10 reps - Standing Knee Flexion with Counter Support  - 1-2 x daily - 7 x weekly - 2 sets - 10 reps  ASSESSMENT:  CLINICAL IMPRESSION: Pt will be getting gel injection for L knee tomorrow and R knee will be next week. She did note some L hamstring discomfort througout session. This limited her performance with partial squats. Pt elected to increase her O2 level to 3L during gait training, stating her MD had previously instructed her to do this in the past. She did have improved sat levels following this, but instructed pt to check with MD for approval for this. Decreased back to 2L for other therex. Pt fatigues quickly with resisted hip strengthening exercises in seated position.    OBJECTIVE IMPAIRMENTS: Abnormal gait, cardiopulmonary status limiting activity, decreased activity tolerance, decreased balance, decreased  endurance, decreased mobility, difficulty walking, decreased ROM, decreased strength, and pain.   ACTIVITY LIMITATIONS: carrying, lifting, bending, standing, squatting, stairs, transfers, bathing, dressing, and locomotion level  PARTICIPATION LIMITATIONS: meal prep, cleaning, laundry, driving, shopping, community activity, and yard work  PERSONAL FACTORS: 3+ comorbidities: Knees, Loweback, and wrist arthritis. CHF taking lasix, post inflamitory pulmonary fibrosis; vertigo ( still has syncope at times) are also affecting patient's functional outcome.   REHAB POTENTIAL: Good  CLINICAL DECISION MAKING: Evolving/moderate complexity declining overall mobility   EVALUATION COMPLEXITY: Moderate   GOALS: Goals reviewed with patient? Yes  SHORT TERM GOALS: Target date: 02/13/2023   Therapy will perform 6-minute walk test with patient and come up with baseline goals Baseline: Goal status: MET 01/26/23  2.  Patient will decrease sit to stand time time by 5 seconds Baseline:  Goal status: INITIAL  3.  Patient will perform 10 minutes of interval training on NuStep while maintaining a heart rate less than 110 Baseline:  Goal status: INITIAL LONG TERM GOALS: Target date: 03/13/2023    Patient will have a complete exercise program that she can take to Silver sneakers and continue long-term Baseline:  Goal status: INITIAL  2.  Patient will ambulate  600 feet without self-report of dyspnea in order to attend appointments without being short of breath Baseline:  Goal status: INITIAL  3.  Patient will stand for greater than 20 minutes without increased pain in left knee and increased dyspnea in order to perform ADLs Baseline:  Goal status: INITIAL    PLAN:  PT FREQUENCY: 2x/week  PT DURATION: 8 weeks  PLANNED INTERVENTIONS: Therapeutic exercises, Therapeutic activity, Neuromuscular re-education, Balance training, Gait training, Patient/Family education, Self Care, Joint mobilization,  Stair training, DME instructions, Aquatic Therapy, Dry Needling, Electrical stimulation, Spinal mobilization, Cryotherapy, Moist heat, Ultrasound, and Manual therapy  PLAN FOR NEXT SESSION: 3 or 6 minute walk test. Begin interval training on the nu-step.    Graceann Congress Fredericka Bottcher, PTA 02/10/2023, 5:17 PM

## 2023-02-11 DIAGNOSIS — M1712 Unilateral primary osteoarthritis, left knee: Secondary | ICD-10-CM | POA: Diagnosis not present

## 2023-02-12 DIAGNOSIS — E119 Type 2 diabetes mellitus without complications: Secondary | ICD-10-CM | POA: Diagnosis not present

## 2023-02-12 DIAGNOSIS — Z794 Long term (current) use of insulin: Secondary | ICD-10-CM | POA: Diagnosis not present

## 2023-02-13 ENCOUNTER — Ambulatory Visit (HOSPITAL_BASED_OUTPATIENT_CLINIC_OR_DEPARTMENT_OTHER): Payer: PPO | Admitting: Physical Therapy

## 2023-02-13 ENCOUNTER — Encounter (HOSPITAL_BASED_OUTPATIENT_CLINIC_OR_DEPARTMENT_OTHER): Payer: Self-pay | Admitting: Physical Therapy

## 2023-02-13 DIAGNOSIS — R2689 Other abnormalities of gait and mobility: Secondary | ICD-10-CM | POA: Diagnosis not present

## 2023-02-13 DIAGNOSIS — R5381 Other malaise: Secondary | ICD-10-CM

## 2023-02-13 DIAGNOSIS — M1711 Unilateral primary osteoarthritis, right knee: Secondary | ICD-10-CM | POA: Diagnosis not present

## 2023-02-13 NOTE — Therapy (Signed)
OUTPATIENT PHYSICAL THERAPY LOWER EXTREMITY treatment    Patient Name: Bonnie Gonzalez MRN: [id number] DOB:28-Nov-1956, 67 y.o., female Today's Date: 02/13/2023  END OF SESSION:  PT End of Session - 02/13/23 1314     Visit Number 7    Number of Visits 16    Date for PT Re-Evaluation 03/12/23    PT Start Time 1300    PT Stop Time 1342    PT Time Calculation (min) 42 min    Activity Tolerance Patient tolerated treatment well    Behavior During Therapy [facility] for tasks assessed/performed                Past Medical History:  Diagnosis Date   Acquired hypothyroidism 10/01/2010   Allergic rhinitis 07/20/2009   Arthritis    Back pain 10/18/2015   Bilateral leg edema 03/31/2018   Centrilobular emphysema 02/04/2019   HRCT 09/15/18   Chronic diastolic CHF (congestive heart failure) 01/28/2019   Chronic respiratory failure with hypoxia 02/04/2019   6 Min Walk 08/31/18 at [facility]   Chronic urticaria 12/21/2019   COPD (chronic obstructive pulmonary disease) 09/01/2018   Quit smoking 2010  - Spirometry 08/31/2018  FEV1 1.1 (51%)  Ratio 66 s prior rx with typical curvature  - 08/31/2018  After extensive coaching inhaler device,  effectiveness =    90% with elipta > try anoro sample > no better so d/c'  - PFT's  11/10/2018  FEV1 1.24 (59 % ) ratio 75 (ratio with fev1/VC =  63)  p no % improvement from saba p nothing prior to study with DLCO  105  % corrects to 132  %    DOE (dyspnea on exertion) 08/31/2018   Onset 05/2018 with background of unexplained leg swelling x 01/2018   -echo 04/13/18 just G1   diastolic dysfunction s PH   -  08/31/2018   Walked RA x one lap @ 185 stopped due to  Sob/ sats 89% at nl pace  - Spirometry 08/31/2018  FEV1 1.1 (51%)  Ratio 66 s prior rx  - 09/28/2018 could not do full pfts 09/28/2018  - Collagen vasc profile 09/28/18  For ESR =  54 (was 102 before prednisone)  Neg collag   Dyshidrotic eczema 12/13/2010   Essential hypertension 01/26/2019    Generalized anxiety disorder with panic attacks 01/20/2018   GERD (gastroesophageal reflux disease) 11/03/2007   Headache 11/03/2007   Irritable bowel syndrome 07/13/2009   Major depressive disorder 07/17/2020   Mixed hyperlipidemia 07/20/2009   Obesity    OSA (obstructive sleep apnea) 07/26/2015   HST 08/2015 mild OSA, AHI 8/hour, lowest desaturation 81%, desaturations noted without respiratory events  Formatting of this note might be different from the original. PSG 05/20/19 AHI 25.1   Osteopenia of multiple sites 06/01/2019   DEXA 05/24/2019: Right femur neck T- 2.2, left femur neck T- 1.5, right total femur T -0.5, left total femur T -0.5, AP total spine T- 1.9. FRAX 10-year probability of major osteoporotic fracture is 8.8% and a hip fracture is 1.2%  DEXA 05/05/2019: Right femur neck T- 1.9, left femur neck T- 2.0, right total femur T -1.3, left total femur   Oxygen deficiency    Postinflammatory pulmonary fibrosis 09/15/2018   ESR    10/161/9  = 102   > rx short term prednisone ? slt improvement in symptoms HRCT 09/15/2018  1. Very mild basilar subpleural ground-glass, reticulation and traction bronchiolectasis, findings which may be minimally progressive from 06/29/2017 and are  indicative of interstitial lung disease such as nonspecific interstitial pneumonitis. Findings are indeterminate for UIP per consensus guidelin   Psychophysiological insomnia 03/29/2019   Type 2 diabetes mellitus 02/09/2017   UTI (urinary tract infection)    Vertigo 03/29/2014   Vitamin D deficiency 09/15/2020   Past Surgical History:  Procedure Laterality Date   ANKLE FRACTURE SURGERY Right    benign tumor      removed from groin   COLONOSCOPY WITH PROPOFOL N/A 05/15/2022   Procedure: COLONOSCOPY WITH PROPOFOL;  Surgeon: Mauri Pole, MD;  Location: [facility];  Service: Gastroenterology;  Laterality: N/A;   HOT HEMOSTASIS N/A 05/15/2022   Procedure: HOT HEMOSTASIS (ARGON PLASMA COAGULATION/BICAP);   Surgeon: Mauri Pole, MD;  Location: [facility];  Service: Gastroenterology;  Laterality: N/A;   POLYPECTOMY  05/15/2022   Procedure: POLYPECTOMY;  Surgeon: Mauri Pole, MD;  Location: [facility];  Service: Gastroenterology;;   WRIST ARTHROCENTESIS N/A 2001   Patient Active Problem List   Diagnosis Date Noted   Morbid obesity ([place]) 06/16/2022   Physical deconditioning 06/16/2022   AVM (arteriovenous malformation) of colon, acquired with hemorrhage    Positive colorectal cancer screening using Cologuard test    Polyp of sigmoid colon    Polyp of transverse colon    Polyp of cecum    COVID-19 virus infection 11/21/2021   Vitamin D deficiency 09/15/2020   Major depressive disorder 07/17/2020   Chronic urticaria 12/21/2019   Osteopenia of multiple sites 06/01/2019   Psychophysiological insomnia 03/29/2019   Centrilobular emphysema 02/04/2019   Chronic respiratory failure with hypoxia 02/04/2019   Chronic diastolic CHF (congestive heart failure) 01/28/2019   Essential hypertension 01/26/2019   Postinflammatory pulmonary fibrosis 09/15/2018   COPD (chronic obstructive pulmonary disease) 09/01/2018   DOE (dyspnea on exertion) 08/31/2018   Bilateral leg edema 03/31/2018   Generalized anxiety disorder with panic attacks 01/20/2018   Type 2 diabetes mellitus 02/09/2017   Back pain 10/18/2015   OSA (obstructive sleep apnea) 07/26/2015   Vertigo 03/29/2014   Dyshidrotic eczema 12/13/2010   Acquired hypothyroidism 10/01/2010   Mixed hyperlipidemia 07/20/2009   Allergic rhinitis 07/20/2009   Irritable bowel syndrome 07/13/2009   GERD (gastroesophageal reflux disease) 11/03/2007   Headache 11/03/2007    PCP: [name] MD   REFERRING PROVIDER: [name] MD   REFERRING DIAG:  Diagnosis  J96.11 (ICD-10-CM) - Chronic respiratory failure with hypoxia ([place])    THERAPY DIAG:  Other abnormalities of gait and mobility  Physical deconditioning  Rationale for  Evaluation and Treatment: Rehabilitation  ONSET DATE: has been on O2 for 3 years   SUBJECTIVE:   SUBJECTIVE STATEMENT: Patient had an injection in the left knee. She reports it has helped.  PERTINENT HISTORY: Knees, Loweback, and wrist arthritis. CHF taking lasix, post inflamitory pulmonary fibrosis; vertigo ( still has syncope at times)  PAIN:  Are you having pain? Yes: NPRS scale: 5/10 Pain location: Left knee Pain description: Aching Aggravating factors: Standing walking Relieving factors: Sitting  PRECAUTIONS: falls   WEIGHT BEARING RESTRICTIONS: No  FALLS:  Has patient fallen in last 6 months? No but has ad some near falls   LIVING ENVIRONMENT: Ramp into the house   OCCUPATION: retired  Recreation: would like to be able to get back to gardening  PLOF: Independent with household mobility with device  PATIENT GOALS: To increase endurance  NEXT MD VISIT: Nothing scheduled  OBJECTIVE:   DIAGNOSTIC FINDINGS: Nothing of the knee  PATIENT SURVEYS:  FOTO deferred  2nd to time   COGNITION: Overall cognitive status: Within functional limits for tasks assessed     SENSATION: Has had tinglining in her hands. Has been using wrist guards  EDEMA:   MUSCLE LENGTH:  POSTURE: rounded shoulders, forward head, and flexed trunk   PALPATION: No unexpected tenderness palpation  LOWER EXTREMITY ROM:  Passive ROM Right eval Left eval  Hip flexion    Hip extension    Hip abduction    Hip adduction    Hip internal rotation    Hip external rotation    Knee flexion  Painful at end range  Knee extension    Ankle dorsiflexion    Ankle plantarflexion    Ankle inversion    Ankle eversion     (Blank rows = not tested)  LOWER EXTREMITY MMT:  MMT Right eval Left eval  Hip flexion 19.7 17.0  Hip extension    Hip abduction 36.9 40.3  Hip adduction    Hip internal rotation    Hip external rotation    Knee flexion    Knee extension 20 18.7  Ankle dorsiflexion     Ankle plantarflexion    Ankle inversion    Ankle eversion     (Blank rows = not tested)  LOWER EXTREMITY SPECIAL TESTS:   IE: 5x sit to stand: 24 sec   Hr 85  Sao2 96   01/26/23: 63min walk test: 261ft total- seated rest break at 117ft and paused timer.  GAIT: Ambulated 123ft x2 at beginning of session. Seated rest break between. SpO2 was 90% afterwards. Rose to 97%  Ambulated 162ft x2 at end of session. O2 remained at 97-98% this time.  TODAY'S TREATMENT:                                                                                                                               DATE:  3/15 Nu-step L3 104min  - biceps curls 2x10 2lbs  - alt 1lb 2x10  - sitting chest press 2lb 2x10     - Sits to stand 3x5   - ambulation 150 3x with monitoring of sao2  See below for Sao2   3/12  Nu-step L3 58min After 89%  -Gait- 4x134ft beginning of session, seated rest break between (3L of O2 for walking)   Bilateral er 2x10 yellow  Bilateral horizontal abduction 2x10 yellow   Seated hip abduction yellow  2x10- feet on step Seated March 2x10 yellow    -Standing marching 2x10 -Standing heel raise 2x10 -partial squats- unable due to L knee  The patients Sao2 was checked frequently and ranged between 89 and 99%   3/6 Nu-step L3 5:15 368 steps  Baseline Sao2 91%  After 93%    Bilateral er 2x10 yellow  Bilateral horizontal abduction 2x10 yellow  Bilateral flexion  2x10 yellow band    Seated hip abduction 2x10  Seated March 2x10 yellow  Seated LAQ 2x10 each leg yellow  Ball squeeze 2x10   The patients Sao2 was checked frequently and ranged between 88 and 98         PATIENT EDUCATION:  Education details: HEP, symptom management, activity progression Person educated: Patient Education method: Explanation, Demonstration, Tactile cues, and Verbal cues Education comprehension: verbalized understanding, returned demonstration, verbal cues required, tactile cues  required, and needs further education  HOME EXERCISE PROGRAM: Access Code: [email or web address] URL: [email or web address] Date: 01/19/2023 Prepared by: Sherlynn Carbon  Exercises - Heel Raises with Counter Support  - 1-2 x daily - 7 x weekly - 2 sets - 10 reps - Standing March with Counter Support  - 1-2 x daily - 7 x weekly - 2 sets - 10 reps - Standing Hip Abduction with Counter Support  - 1-2 x daily - 7 x weekly - 2 sets - 10 reps - Standing Hip Extension with Counter Support  - 1-2 x daily - 7 x weekly - 2 sets - 10 reps - Mini Squat with Counter Support  - 1-2 x daily - 7 x weekly - 2 sets - 10 reps - Standing Knee Flexion with Counter Support  - 1-2 x daily - 7 x weekly - 2 sets - 10 reps  ASSESSMENT:  CLINICAL IMPRESSION: The patient's sao2 dropped down significantly today with UE exercises. It would come back up then drop down. We tried her pulse ox and it was up around 90 %. Using hers it only dropped to 87 % and went back up. Her last lap walking her sao2 remained greater then 95%. We will continue to progress as tolerated. She was advised to do her UE exercises tomorrow and see what her sao2 does.    OBJECTIVE IMPAIRMENTS: Abnormal gait, cardiopulmonary status limiting activity, decreased activity tolerance, decreased balance, decreased endurance, decreased mobility, difficulty walking, decreased ROM, decreased strength, and pain.   ACTIVITY LIMITATIONS: carrying, lifting, bending, standing, squatting, stairs, transfers, bathing, dressing, and locomotion level  PARTICIPATION LIMITATIONS: meal prep, cleaning, laundry, driving, shopping, community activity, and yard work  PERSONAL FACTORS: 3+ comorbidities: Knees, Loweback, and wrist arthritis. CHF taking lasix, post inflamitory pulmonary fibrosis; vertigo ( still has syncope at times) are also affecting patient's functional outcome.   REHAB POTENTIAL: Good  CLINICAL DECISION MAKING: Evolving/moderate complexity declining  overall mobility   EVALUATION COMPLEXITY: Moderate   GOALS: Goals reviewed with patient? Yes  SHORT TERM GOALS: Target date: 02/13/2023   Therapy will perform 6-minute walk test with patient and come up with baseline goals Baseline: Goal status: MET 01/26/23  2.  Patient will decrease sit to stand time time by 5 seconds Baseline:  Goal status: INITIAL  3.  Patient will perform 10 minutes of interval training on NuStep while maintaining a heart rate less than 110 Baseline:  Goal status: INITIAL LONG TERM GOALS: Target date: 03/13/2023    Patient will have a complete exercise program that she can take to [employer] sne[employer] and continue long-term Baseline:  Goal status: INITIAL  2.  Patient will ambulate 600 feet without self-report of dyspnea in order to attend appointments without being short of breath Baseline:  Goal status: INITIAL  3.  Patient will stand for greater than 20 minutes without increased pain in left knee and increased dyspnea in order to perform ADLs Baseline:  Goal status: INITIAL    PLAN:  PT FREQUENCY: 2x/week  PT DURATION: 8 weeks  PLANNED INTERVENTIONS: Therapeutic exercises, Therapeutic activity, Neuromuscular re-education, Balance training, Gait training, Patient/Family education, Self Care, Joint mobilization, Stair training,  DME instructions, Aquatic Therapy, Dry Needling, Electrical stimulation, Spinal mobilization, Cryotherapy, Moist heat, Ultrasound, and Manual therapy  PLAN FOR NEXT SESSION: 3 or 6 minute walk test. Begin interval training on the nu-step.    Carney Living, PT 02/13/2023, 2:14 PM

## 2023-02-17 ENCOUNTER — Ambulatory Visit (HOSPITAL_BASED_OUTPATIENT_CLINIC_OR_DEPARTMENT_OTHER): Payer: PPO

## 2023-02-17 ENCOUNTER — Other Ambulatory Visit: Payer: Self-pay

## 2023-02-17 ENCOUNTER — Encounter (HOSPITAL_BASED_OUTPATIENT_CLINIC_OR_DEPARTMENT_OTHER): Payer: Self-pay

## 2023-02-17 DIAGNOSIS — R2689 Other abnormalities of gait and mobility: Secondary | ICD-10-CM

## 2023-02-17 DIAGNOSIS — Z794 Long term (current) use of insulin: Secondary | ICD-10-CM

## 2023-02-17 DIAGNOSIS — R5381 Other malaise: Secondary | ICD-10-CM

## 2023-02-17 DIAGNOSIS — J439 Emphysema, unspecified: Secondary | ICD-10-CM

## 2023-02-17 NOTE — Therapy (Signed)
OUTPATIENT PHYSICAL THERAPY LOWER EXTREMITY treatment    Patient Name: Bonnie Gonzalez MRN: [id number] DOB:September 30, 1956, 67 y.o., female Today's Date: 02/17/2023  END OF SESSION:  PT End of Session - 02/17/23 1330     Visit Number 8    Number of Visits 16    Date for PT Re-Evaluation 03/12/23    PT Start Time 1300    PT Stop Time 1345    PT Time Calculation (min) 45 min    Activity Tolerance Patient tolerated treatment well    Behavior During Therapy [facility] for tasks assessed/performed                 Past Medical History:  Diagnosis Date   Acquired hypothyroidism 10/01/2010   Allergic rhinitis 07/20/2009   Arthritis    Back pain 10/18/2015   Bilateral leg edema 03/31/2018   Centrilobular emphysema 02/04/2019   HRCT 09/15/18   Chronic diastolic CHF (congestive heart failure) 01/28/2019   Chronic respiratory failure with hypoxia 02/04/2019   6 Min Walk 08/31/18 at [facility]   Chronic urticaria 12/21/2019   COPD (chronic obstructive pulmonary disease) 09/01/2018   Quit smoking 2010  - Spirometry 08/31/2018  FEV1 1.1 (51%)  Ratio 66 s prior rx with typical curvature  - 08/31/2018  After extensive coaching inhaler device,  effectiveness =    90% with elipta > try anoro sample > no better so d/c'  - PFT's  11/10/2018  FEV1 1.24 (59 % ) ratio 75 (ratio with fev1/VC =  63)  p no % improvement from saba p nothing prior to study with DLCO  105  % corrects to 132  %    DOE (dyspnea on exertion) 08/31/2018   Onset 05/2018 with background of unexplained leg swelling x 01/2018   -echo 04/13/18 just G1   diastolic dysfunction s PH   -  08/31/2018   Walked RA x one lap @ 185 stopped due to  Sob/ sats 89% at nl pace  - Spirometry 08/31/2018  FEV1 1.1 (51%)  Ratio 66 s prior rx  - 09/28/2018 could not do full pfts 09/28/2018  - Collagen vasc profile 09/28/18  For ESR =  54 (was 102 before prednisone)  Neg collag   Dyshidrotic eczema 12/13/2010   Essential hypertension 01/26/2019    Generalized anxiety disorder with panic attacks 01/20/2018   GERD (gastroesophageal reflux disease) 11/03/2007   Headache 11/03/2007   Irritable bowel syndrome 07/13/2009   Major depressive disorder 07/17/2020   Mixed hyperlipidemia 07/20/2009   Obesity    OSA (obstructive sleep apnea) 07/26/2015   HST 08/2015 mild OSA, AHI 8/hour, lowest desaturation 81%, desaturations noted without respiratory events  Formatting of this note might be different from the original. PSG 05/20/19 AHI 25.1   Osteopenia of multiple sites 06/01/2019   DEXA 05/24/2019: Right femur neck T- 2.2, left femur neck T- 1.5, right total femur T -0.5, left total femur T -0.5, AP total spine T- 1.9. FRAX 10-year probability of major osteoporotic fracture is 8.8% and a hip fracture is 1.2%  DEXA 05/05/2019: Right femur neck T- 1.9, left femur neck T- 2.0, right total femur T -1.3, left total femur   Oxygen deficiency    Postinflammatory pulmonary fibrosis 09/15/2018   ESR    10/161/9  = 102   > rx short term prednisone ? slt improvement in symptoms HRCT 09/15/2018  1. Very mild basilar subpleural ground-glass, reticulation and traction bronchiolectasis, findings which may be minimally progressive from 06/29/2017 and  are indicative of interstitial lung disease such as nonspecific interstitial pneumonitis. Findings are indeterminate for UIP per consensus guidelin   Psychophysiological insomnia 03/29/2019   Type 2 diabetes mellitus 02/09/2017   UTI (urinary tract infection)    Vertigo 03/29/2014   Vitamin D deficiency 09/15/2020   Past Surgical History:  Procedure Laterality Date   ANKLE FRACTURE SURGERY Right    benign tumor      removed from groin   COLONOSCOPY WITH PROPOFOL N/A 05/15/2022   Procedure: COLONOSCOPY WITH PROPOFOL;  Surgeon: Mauri Pole, MD;  Location: [facility];  Service: Gastroenterology;  Laterality: N/A;   HOT HEMOSTASIS N/A 05/15/2022   Procedure: HOT HEMOSTASIS (ARGON PLASMA COAGULATION/BICAP);   Surgeon: Mauri Pole, MD;  Location: [facility];  Service: Gastroenterology;  Laterality: N/A;   POLYPECTOMY  05/15/2022   Procedure: POLYPECTOMY;  Surgeon: Mauri Pole, MD;  Location: [facility];  Service: Gastroenterology;;   WRIST ARTHROCENTESIS N/A 2001   Patient Active Problem List   Diagnosis Date Noted   Morbid obesity ([place]) 06/16/2022   Physical deconditioning 06/16/2022   AVM (arteriovenous malformation) of colon, acquired with hemorrhage    Positive colorectal cancer screening using Cologuard test    Polyp of sigmoid colon    Polyp of transverse colon    Polyp of cecum    COVID-19 virus infection 11/21/2021   Vitamin D deficiency 09/15/2020   Major depressive disorder 07/17/2020   Chronic urticaria 12/21/2019   Osteopenia of multiple sites 06/01/2019   Psychophysiological insomnia 03/29/2019   Centrilobular emphysema 02/04/2019   Chronic respiratory failure with hypoxia 02/04/2019   Chronic diastolic CHF (congestive heart failure) 01/28/2019   Essential hypertension 01/26/2019   Postinflammatory pulmonary fibrosis 09/15/2018   COPD (chronic obstructive pulmonary disease) 09/01/2018   DOE (dyspnea on exertion) 08/31/2018   Bilateral leg edema 03/31/2018   Generalized anxiety disorder with panic attacks 01/20/2018   Type 2 diabetes mellitus 02/09/2017   Back pain 10/18/2015   OSA (obstructive sleep apnea) 07/26/2015   Vertigo 03/29/2014   Dyshidrotic eczema 12/13/2010   Acquired hypothyroidism 10/01/2010   Mixed hyperlipidemia 07/20/2009   Allergic rhinitis 07/20/2009   Irritable bowel syndrome 07/13/2009   GERD (gastroesophageal reflux disease) 11/03/2007   Headache 11/03/2007    PCP: [name] MD   REFERRING PROVIDER: [name] MD   REFERRING DIAG:  Diagnosis  J96.11 (ICD-10-CM) - Chronic respiratory failure with hypoxia ([place])    THERAPY DIAG:  Other abnormalities of gait and mobility  Physical deconditioning  Rationale for  Evaluation and Treatment: Rehabilitation  ONSET DATE: has been on O2 for 3 years   SUBJECTIVE:   SUBJECTIVE STATEMENT: Patient reports some posterior knee discomfort at entry.  PERTINENT HISTORY: Knees, Loweback, and wrist arthritis. CHF taking lasix, post inflamitory pulmonary fibrosis; vertigo ( still has syncope at times)  PAIN:  Are you having pain? Yes: NPRS scale: 4/10 Pain location: Left knee Pain description: Aching Aggravating factors: Standing walking Relieving factors: Sitting  PRECAUTIONS: falls   WEIGHT BEARING RESTRICTIONS: No  FALLS:  Has patient fallen in last 6 months? No but has ad some near falls   LIVING ENVIRONMENT: Ramp into the house   OCCUPATION: retired  Recreation: would like to be able to get back to gardening  PLOF: Independent with household mobility with device  PATIENT GOALS: To increase endurance  NEXT MD VISIT: Nothing scheduled  OBJECTIVE:   DIAGNOSTIC FINDINGS: Nothing of the knee  PATIENT SURVEYS:  FOTO deferred 2nd to time  COGNITION: Overall cognitive status: Within functional limits for tasks assessed     SENSATION: Has had tinglining in her hands. Has been using wrist guards  EDEMA:   MUSCLE LENGTH:  POSTURE: rounded shoulders, forward head, and flexed trunk   PALPATION: No unexpected tenderness palpation  LOWER EXTREMITY ROM:  Passive ROM Right eval Left eval  Hip flexion    Hip extension    Hip abduction    Hip adduction    Hip internal rotation    Hip external rotation    Knee flexion  Painful at end range  Knee extension    Ankle dorsiflexion    Ankle plantarflexion    Ankle inversion    Ankle eversion     (Blank rows = not tested)  LOWER EXTREMITY MMT:  MMT Right eval Left eval  Hip flexion 19.7 17.0  Hip extension    Hip abduction 36.9 40.3  Hip adduction    Hip internal rotation    Hip external rotation    Knee flexion    Knee extension 20 18.7  Ankle dorsiflexion    Ankle  plantarflexion    Ankle inversion    Ankle eversion     (Blank rows = not tested)  LOWER EXTREMITY SPECIAL TESTS:   IE: 5x sit to stand: 24 sec   Hr 85  Sao2 96   01/26/23: 32min walk test: 238ft total- seated rest break at 161ft and paused timer.  GAIT: Ambulated 177ft x2 at beginning of session. Seated rest break between. SpO2 was 90% afterwards. Rose to 97%  Ambulated 149ft x2 at end of session. O2 remained at 97-98% this time.  TODAY'S TREATMENT:                                                                                                                               DATE:   3/19 Nu-step L3 65min  - biceps curls 2x10 2lbs   - sitting chest press 2lb 2x10     - Sits to stand 3x5   - ambulation 150 6x with monitoring of sao2  See below for Sao2   -Standing marching 2x10 -Standing heel raise 2x10 -standing hip abd 2x10 -standing hip extension 1x10  3/15 Nu-step L3 62min  - biceps curls 2x10 2lbs  - alt 1lb 2x10  - sitting chest press 2lb 2x10     - Sits to stand 3x5   - ambulation 150 3x with monitoring of sao2  See below for Sao2   3/12  Nu-step L3 81min After 89%  -Gait- 4x129ft beginning of session, seated rest break between (3L of O2 for walking)   Bilateral er 2x10 yellow  Bilateral horizontal abduction 2x10 yellow   Seated hip abduction yellow  2x10- feet on step Seated March 2x10 yellow    -Standing marching 2x10 -Standing heel raise 2x10 -partial squats- unable due to L knee  The patients Sao2 was checked frequently and ranged between 89 and  99%   3/6 Nu-step L3 5:15 368 steps  Baseline Sao2 91%  After 93%    Bilateral er 2x10 yellow  Bilateral horizontal abduction 2x10 yellow  Bilateral flexion  2x10 yellow band    Seated hip abduction 2x10  Seated March 2x10 yellow  Seated LAQ 2x10 each leg yellow  Ball squeeze 2x10   The patients Sao2 was checked frequently and ranged between 88 and 98         PATIENT EDUCATION:   Education details: HEP, symptom management, activity progression Person educated: Patient Education method: Explanation, Demonstration, Tactile cues, and Verbal cues Education comprehension: verbalized understanding, returned demonstration, verbal cues required, tactile cues required, and needs further education  HOME EXERCISE PROGRAM: Access Code: [email or web address] URL: [email or web address] Date: 01/19/2023 Prepared by: Sherlynn Carbon  Exercises - Heel Raises with Counter Support  - 1-2 x daily - 7 x weekly - 2 sets - 10 reps - Standing March with Counter Support  - 1-2 x daily - 7 x weekly - 2 sets - 10 reps - Standing Hip Abduction with Counter Support  - 1-2 x daily - 7 x weekly - 2 sets - 10 reps - Standing Hip Extension with Counter Support  - 1-2 x daily - 7 x weekly - 2 sets - 10 reps - Mini Squat with Counter Support  - 1-2 x daily - 7 x weekly - 2 sets - 10 reps - Standing Knee Flexion with Counter Support  - 1-2 x daily - 7 x weekly - 2 sets - 10 reps  ASSESSMENT:  CLINICAL IMPRESSION: With ambulation, spO2 ranged from 89-99%. For third/last lap around clinic, we raised O2 to 3L, which pt was approved for per MD. This helped her maintain her higher O2 levels. Decreased O2 level with seated exercises as pt is usually able to maintain high O2 level with these. Today, however, she did demonstrate decreases to 94% with seated exs. At one point she decreased to 87%. Bumped up to 3L which helped raise O2 level. Will continue to monitor O2 levels over time. She did have mild posterior knee discomfort intermittently with ambulation and also noted some with marching. This subsided with decreased range during marching. Difficulty with FWB through RLE during standing hip abduction and ext due to L knee. Showed pt seated HSS to perform at home which she felt benefit from. Will continue to progress strengthening and endurance as tolerated.    OBJECTIVE IMPAIRMENTS: Abnormal gait,  cardiopulmonary status limiting activity, decreased activity tolerance, decreased balance, decreased endurance, decreased mobility, difficulty walking, decreased ROM, decreased strength, and pain.   ACTIVITY LIMITATIONS: carrying, lifting, bending, standing, squatting, stairs, transfers, bathing, dressing, and locomotion level  PARTICIPATION LIMITATIONS: meal prep, cleaning, laundry, driving, shopping, community activity, and yard work  PERSONAL FACTORS: 3+ comorbidities: Knees, Loweback, and wrist arthritis. CHF taking lasix, post inflamitory pulmonary fibrosis; vertigo ( still has syncope at times) are also affecting patient's functional outcome.   REHAB POTENTIAL: Good  CLINICAL DECISION MAKING: Evolving/moderate complexity declining overall mobility   EVALUATION COMPLEXITY: Moderate   GOALS: Goals reviewed with patient? Yes  SHORT TERM GOALS: Target date: 02/13/2023   Therapy will perform 6-minute walk test with patient and come up with baseline goals Baseline: Goal status: MET 01/26/23  2.  Patient will decrease sit to stand time time by 5 seconds Baseline:  Goal status: INITIAL  3.  Patient will perform 10 minutes of interval training on NuStep while maintaining a heart rate less than 110 Baseline:  Goal status: INITIAL LONG TERM GOALS: Target date: 03/13/2023    Patient will have a complete exercise program that she can take to [employer] sneakers and continue long-term Baseline:  Goal status: INITIAL  2.  Patient will ambulate 600 feet without self-report of dyspnea in order to attend appointments without being short of breath Baseline:  Goal status: INITIAL  3.  Patient will stand for greater than 20 minutes without increased pain in left knee and increased dyspnea in order to perform ADLs Baseline:  Goal status: INITIAL    PLAN:  PT FREQUENCY: 2x/week  PT DURATION: 8 weeks  PLANNED INTERVENTIONS: Therapeutic exercises, Therapeutic activity, Neuromuscular  re-education, Balance training, Gait training, Patient/Family education, Self Care, Joint mobilization, Stair training, DME instructions, Aquatic Therapy, Dry Needling, Electrical stimulation, Spinal mobilization, Cryotherapy, Moist heat, Ultrasound, and Manual therapy  PLAN FOR NEXT SESSION: 3 or 6 minute walk test. Begin interval training on the nu-step.    Graceann Congress Hannahmarie Asberry, PTA 02/17/2023, 2:03 PM

## 2023-02-18 ENCOUNTER — Other Ambulatory Visit: Payer: Self-pay | Admitting: Family Medicine

## 2023-02-18 DIAGNOSIS — M1711 Unilateral primary osteoarthritis, right knee: Secondary | ICD-10-CM | POA: Diagnosis not present

## 2023-02-19 ENCOUNTER — Telehealth: Payer: Self-pay

## 2023-02-19 NOTE — Progress Notes (Signed)
Patient ID: Bonnie Gonzalez, female   DOB: [birth year], 67 y.o.   MRN: [id number]  Call to patient per Theo Dills to see if she is ok with the [facility] price for terbinafine of $18.76 for 30 DS since it is not covered with her insurance and no alternatives suggested at this time.  BIN: [id number]  PCN: [id number]  Group: [id number]  IDAY:[id number]   Patient reports she is ok with affording that cost for 30 DS, Pharmacist advised and above billing info to be supplied to her pharmacy.    [place] Clinical Pharmacist Assistant [phone]

## 2023-02-20 ENCOUNTER — Ambulatory Visit (HOSPITAL_BASED_OUTPATIENT_CLINIC_OR_DEPARTMENT_OTHER): Payer: PPO | Admitting: Physical Therapy

## 2023-02-20 DIAGNOSIS — M1711 Unilateral primary osteoarthritis, right knee: Secondary | ICD-10-CM | POA: Diagnosis not present

## 2023-02-20 DIAGNOSIS — R2689 Other abnormalities of gait and mobility: Secondary | ICD-10-CM | POA: Diagnosis not present

## 2023-02-20 DIAGNOSIS — R5381 Other malaise: Secondary | ICD-10-CM

## 2023-02-20 NOTE — Therapy (Signed)
OUTPATIENT PHYSICAL THERAPY LOWER EXTREMITY treatment    Patient Name: Bonnie Gonzalez MRN: [id number] DOB:August 31, 1956, 67 y.o., female Today's Date: 02/17/2023  END OF SESSION:  PT End of Session - 02/17/23 1330     Visit Number 8    Number of Visits 16    Date for PT Re-Evaluation 03/12/23    PT Start Time 1300    PT Stop Time 1345    PT Time Calculation (min) 45 min    Activity Tolerance Patient tolerated treatment well    Behavior During Therapy [facility] for tasks assessed/performed                 Past Medical History:  Diagnosis Date   Acquired hypothyroidism 10/01/2010   Allergic rhinitis 07/20/2009   Arthritis    Back pain 10/18/2015   Bilateral leg edema 03/31/2018   Centrilobular emphysema 02/04/2019   HRCT 09/15/18   Chronic diastolic CHF (congestive heart failure) 01/28/2019   Chronic respiratory failure with hypoxia 02/04/2019   6 Min Walk 08/31/18 at [facility]   Chronic urticaria 12/21/2019   COPD (chronic obstructive pulmonary disease) 09/01/2018   Quit smoking 2010  - Spirometry 08/31/2018  FEV1 1.1 (51%)  Ratio 66 s prior rx with typical curvature  - 08/31/2018  After extensive coaching inhaler device,  effectiveness =    90% with elipta > try anoro sample > no better so d/c'  - PFT's  11/10/2018  FEV1 1.24 (59 % ) ratio 75 (ratio with fev1/VC =  63)  p no % improvement from saba p nothing prior to study with DLCO  105  % corrects to 132  %    DOE (dyspnea on exertion) 08/31/2018   Onset 05/2018 with background of unexplained leg swelling x 01/2018   -echo 04/13/18 just G1   diastolic dysfunction s PH   -  08/31/2018   Walked RA x one lap @ 185 stopped due to  Sob/ sats 89% at nl pace  - Spirometry 08/31/2018  FEV1 1.1 (51%)  Ratio 66 s prior rx  - 09/28/2018 could not do full pfts 09/28/2018  - Collagen vasc profile 09/28/18  For ESR =  54 (was 102 before prednisone)  Neg collag   Dyshidrotic eczema 12/13/2010   Essential hypertension 01/26/2019    Generalized anxiety disorder with panic attacks 01/20/2018   GERD (gastroesophageal reflux disease) 11/03/2007   Headache 11/03/2007   Irritable bowel syndrome 07/13/2009   Major depressive disorder 07/17/2020   Mixed hyperlipidemia 07/20/2009   Obesity    OSA (obstructive sleep apnea) 07/26/2015   HST 08/2015 mild OSA, AHI 8/hour, lowest desaturation 81%, desaturations noted without respiratory events  Formatting of this note might be different from the original. PSG 05/20/19 AHI 25.1   Osteopenia of multiple sites 06/01/2019   DEXA 05/24/2019: Right femur neck T- 2.2, left femur neck T- 1.5, right total femur T -0.5, left total femur T -0.5, AP total spine T- 1.9. FRAX 10-year probability of major osteoporotic fracture is 8.8% and a hip fracture is 1.2%  DEXA 05/05/2019: Right femur neck T- 1.9, left femur neck T- 2.0, right total femur T -1.3, left total femur   Oxygen deficiency    Postinflammatory pulmonary fibrosis 09/15/2018   ESR    10/161/9  = 102   > rx short term prednisone ? slt improvement in symptoms HRCT 09/15/2018  1. Very mild basilar subpleural ground-glass, reticulation and traction bronchiolectasis, findings which may be minimally progressive from 06/29/2017 and  are indicative of interstitial lung disease such as nonspecific interstitial pneumonitis. Findings are indeterminate for UIP per consensus guidelin   Psychophysiological insomnia 03/29/2019   Type 2 diabetes mellitus 02/09/2017   UTI (urinary tract infection)    Vertigo 03/29/2014   Vitamin D deficiency 09/15/2020   Past Surgical History:  Procedure Laterality Date   ANKLE FRACTURE SURGERY Right    benign tumor      removed from groin   COLONOSCOPY WITH PROPOFOL N/A 05/15/2022   Procedure: COLONOSCOPY WITH PROPOFOL;  Surgeon: Mauri Pole, MD;  Location: [facility];  Service: Gastroenterology;  Laterality: N/A;   HOT HEMOSTASIS N/A 05/15/2022   Procedure: HOT HEMOSTASIS (ARGON PLASMA COAGULATION/BICAP);   Surgeon: Mauri Pole, MD;  Location: [facility];  Service: Gastroenterology;  Laterality: N/A;   POLYPECTOMY  05/15/2022   Procedure: POLYPECTOMY;  Surgeon: Mauri Pole, MD;  Location: [facility];  Service: Gastroenterology;;   WRIST ARTHROCENTESIS N/A 2001   Patient Active Problem List   Diagnosis Date Noted   Morbid obesity ([place]) 06/16/2022   Physical deconditioning 06/16/2022   AVM (arteriovenous malformation) of colon, acquired with hemorrhage    Positive colorectal cancer screening using Cologuard test    Polyp of sigmoid colon    Polyp of transverse colon    Polyp of cecum    COVID-19 virus infection 11/21/2021   Vitamin D deficiency 09/15/2020   Major depressive disorder 07/17/2020   Chronic urticaria 12/21/2019   Osteopenia of multiple sites 06/01/2019   Psychophysiological insomnia 03/29/2019   Centrilobular emphysema 02/04/2019   Chronic respiratory failure with hypoxia 02/04/2019   Chronic diastolic CHF (congestive heart failure) 01/28/2019   Essential hypertension 01/26/2019   Postinflammatory pulmonary fibrosis 09/15/2018   COPD (chronic obstructive pulmonary disease) 09/01/2018   DOE (dyspnea on exertion) 08/31/2018   Bilateral leg edema 03/31/2018   Generalized anxiety disorder with panic attacks 01/20/2018   Type 2 diabetes mellitus 02/09/2017   Back pain 10/18/2015   OSA (obstructive sleep apnea) 07/26/2015   Vertigo 03/29/2014   Dyshidrotic eczema 12/13/2010   Acquired hypothyroidism 10/01/2010   Mixed hyperlipidemia 07/20/2009   Allergic rhinitis 07/20/2009   Irritable bowel syndrome 07/13/2009   GERD (gastroesophageal reflux disease) 11/03/2007   Headache 11/03/2007    PCP: [name] MD   REFERRING PROVIDER: [name] MD   REFERRING DIAG:  Diagnosis  J96.11 (ICD-10-CM) - Chronic respiratory failure with hypoxia ([place])    THERAPY DIAG:  Other abnormalities of gait and mobility  Physical deconditioning  Rationale for  Evaluation and Treatment: Rehabilitation  ONSET DATE: has been on O2 for 3 years   SUBJECTIVE:   SUBJECTIVE STATEMENT: Patient reports some posterior knee discomfort at entry on the left anterior knee. She states that she got a gel shot on her right knee earlier today.   PERTINENT HISTORY: Knees, Loweback, and wrist arthritis. CHF taking lasix, post inflamitory pulmonary fibrosis; vertigo ( still has syncope at times)  PAIN:  Are you having pain? Yes: NPRS scale: 4/10 Pain location: Left knee Pain description: Aching Aggravating factors: Standing walking Relieving factors: Sitting  PRECAUTIONS: falls   WEIGHT BEARING RESTRICTIONS: No  FALLS:  Has patient fallen in last 6 months? No but has ad some near falls   LIVING ENVIRONMENT: Ramp into the house   OCCUPATION: retired  Recreation: would like to be able to get back to gardening  PLOF: Independent with household mobility with device  PATIENT GOALS: To increase endurance  NEXT MD VISIT: Nothing scheduled  OBJECTIVE:   DIAGNOSTIC FINDINGS: Nothing of the knee  PATIENT SURVEYS:  FOTO deferred 2nd to time   COGNITION: Overall cognitive status: Within functional limits for tasks assessed     SENSATION: Has had tinglining in her hands. Has been using wrist guards  EDEMA:   MUSCLE LENGTH:  POSTURE: rounded shoulders, forward head, and flexed trunk   PALPATION: No unexpected tenderness palpation  LOWER EXTREMITY ROM:  Passive ROM Right eval Left eval  Hip flexion    Hip extension    Hip abduction    Hip adduction    Hip internal rotation    Hip external rotation    Knee flexion  Painful at end range  Knee extension    Ankle dorsiflexion    Ankle plantarflexion    Ankle inversion    Ankle eversion     (Blank rows = not tested)  LOWER EXTREMITY MMT:  MMT Right eval Left eval  Hip flexion 19.7 17.0  Hip extension    Hip abduction 36.9 40.3  Hip adduction    Hip internal rotation    Hip  external rotation    Knee flexion    Knee extension 20 18.7  Ankle dorsiflexion    Ankle plantarflexion    Ankle inversion    Ankle eversion     (Blank rows = not tested)  LOWER EXTREMITY SPECIAL TESTS:   IE: 5x sit to stand: 24 sec   Hr 85  Sao2 96   01/26/23: 13min walk test: 2100ft total- seated rest break at 153ft and paused timer.  GAIT: Ambulated 153ft x2 at beginning of session. Seated rest break between. SpO2 was 90% afterwards. Rose to 97%  Ambulated 128ft x2 at end of session. O2 remained at 97-98% this time.  TODAY'S TREATMENT:                                                                                                                               DATE:  3/22 Nu-step L3 61min biceps curls 2x10 2lbs  sitting chest press 2lb 2x10   Seated hip abduction with red band 2x10  Seated shoulder ER with yellow band 2x10 Seated shoulder scaption with yellow band 2x10 Seated hip adduction ball squeeze 2x10  Ankle DF 2x10  Ankle heel rises 2x10  ambulation 150 2x with monitoring of sao2      3/19 Nu-step L3 53min  - biceps curls 2x10 2lbs   - sitting chest press 2lb 2x10     - Sits to stand 3x5   - ambulation 150 6x with monitoring of sao2  See below for Sao2   -Standing marching 2x10 -Standing heel raise 2x10 -standing hip abd 2x10 -standing hip extension 1x10  3/15 Nu-step L3 106min  - biceps curls 2x10 2lbs  - alt 1lb 2x10  - sitting chest press 2lb 2x10     - Sits to stand 3x5   - ambulation 150 3x with monitoring of sao2  See below for Sao2   3/12  Nu-step L3 21min After 89%  -Gait- 4x112ft beginning of session, seated rest break between (3L of O2 for walking)   Bilateral er 2x10 yellow  Bilateral horizontal abduction 2x10 yellow   Seated hip abduction yellow  2x10- feet on step Seated March 2x10 yellow    -Standing marching 2x10 -Standing heel raise 2x10 -partial squats- unable due to L knee  The patients Sao2 was checked  frequently and ranged between 89 and 99%   3/6 Nu-step L3 5:15 368 steps  Baseline Sao2 91%  After 93%    Bilateral er 2x10 yellow  Bilateral horizontal abduction 2x10 yellow  Bilateral flexion  2x10 yellow band    Seated hip abduction 2x10  Seated March 2x10 yellow  Seated LAQ 2x10 each leg yellow  Ball squeeze 2x10   The patients Sao2 was checked frequently and ranged between 88 and 98         PATIENT EDUCATION:  Education details: HEP, symptom management, activity progression Person educated: Patient Education method: Explanation, Demonstration, Tactile cues, and Verbal cues Education comprehension: verbalized understanding, returned demonstration, verbal cues required, tactile cues required, and needs further education  HOME EXERCISE PROGRAM: Access Code: [email or web address] URL: [email or web address] Date: 01/19/2023 Prepared by: Sherlynn Carbon  Exercises - Heel Raises with Counter Support  - 1-2 x daily - 7 x weekly - 2 sets - 10 reps - Standing March with Counter Support  - 1-2 x daily - 7 x weekly - 2 sets - 10 reps - Standing Hip Abduction with Counter Support  - 1-2 x daily - 7 x weekly - 2 sets - 10 reps - Standing Hip Extension with Counter Support  - 1-2 x daily - 7 x weekly - 2 sets - 10 reps - Mini Squat with Counter Support  - 1-2 x daily - 7 x weekly - 2 sets - 10 reps - Standing Knee Flexion with Counter Support  - 1-2 x daily - 7 x weekly - 2 sets - 10 reps  ASSESSMENT:  CLINICAL IMPRESSION: Patient had a great session, with minor discomfort in the beginning of the session in her right posterior knee. Pt did the nu step for 6 mins with no increase of pain. Throughout the session we monitored Pt O2 level in stayed within the 90-96% O2 level. Patient ambulated 2x129ft and maintain a 94-96% O2 level. Will continue to monitor O2 levels over time. Patient performed seated ankle pumps and ankle DF with a 94% O2 level. Will continue to progress  strengthening and endurance as tolerated.   OBJECTIVE IMPAIRMENTS: Abnormal gait, cardiopulmonary status limiting activity, decreased activity tolerance, decreased balance, decreased endurance, decreased mobility, difficulty walking, decreased ROM, decreased strength, and pain.   ACTIVITY LIMITATIONS: carrying, lifting, bending, standing, squatting, stairs, transfers, bathing, dressing, and locomotion level  PARTICIPATION LIMITATIONS: meal prep, cleaning, laundry, driving, shopping, community activity, and yard work  PERSONAL FACTORS: 3+ comorbidities: Knees, Loweback, and wrist arthritis. CHF taking lasix, post inflamitory pulmonary fibrosis; vertigo ( still has syncope at times) are also affecting patient's functional outcome.   REHAB POTENTIAL: Good  CLINICAL DECISION MAKING: Evolving/moderate complexity declining overall mobility   EVALUATION COMPLEXITY: Moderate   GOALS: Goals reviewed with patient? Yes  SHORT TERM GOALS: Target date: 02/13/2023   Therapy will perform 6-minute walk test with patient and come up with baseline goals Baseline: Goal status: MET 01/26/23  2.  Patient will decrease sit to stand time time by 5 seconds Baseline:  Goal status: INITIAL  3.  Patient will perform 10 minutes of interval training on NuStep while maintaining a heart rate less than 110 Baseline:  Goal status: INITIAL LONG TERM GOALS: Target date: 03/13/2023    Patient will have a complete exercise program that she can take to [employer] sneakers and continue long-term Baseline:  Goal status: INITIAL  2.  Patient will ambulate 600 feet without self-report of dyspnea in order to attend appointments without being short of breath Baseline:  Goal status: INITIAL  3.  Patient will stand for greater than 20 minutes without increased pain in left knee and increased dyspnea in order to perform ADLs Baseline:  Goal status: INITIAL    PLAN:  PT FREQUENCY: 2x/week  PT DURATION: 8  weeks  PLANNED INTERVENTIONS: Therapeutic exercises, Therapeutic activity, Neuromuscular re-education, Balance training, Gait training, Patient/Family education, Self Care, Joint mobilization, Stair training, DME instructions, Aquatic Therapy, Dry Needling, Electrical stimulation, Spinal mobilization, Cryotherapy, Moist heat, Ultrasound, and Manual therapy  PLAN FOR NEXT SESSION: 3 or 6 minute walk test. Begin interval training on the nu-step.    Graceann Congress Hodor, PTA 02/17/2023, 2:03 PM

## 2023-02-22 DIAGNOSIS — J9611 Chronic respiratory failure with hypoxia: Secondary | ICD-10-CM | POA: Diagnosis not present

## 2023-02-22 DIAGNOSIS — J449 Chronic obstructive pulmonary disease, unspecified: Secondary | ICD-10-CM | POA: Diagnosis not present

## 2023-02-22 DIAGNOSIS — J432 Centrilobular emphysema: Secondary | ICD-10-CM | POA: Diagnosis not present

## 2023-02-25 DIAGNOSIS — M1712 Unilateral primary osteoarthritis, left knee: Secondary | ICD-10-CM | POA: Diagnosis not present

## 2023-02-26 DIAGNOSIS — G4733 Obstructive sleep apnea (adult) (pediatric): Secondary | ICD-10-CM | POA: Diagnosis not present

## 2023-03-02 ENCOUNTER — Other Ambulatory Visit: Payer: Self-pay

## 2023-03-02 MED ORDER — NYSTATIN 100000 UNIT/GM EX POWD: 1.0000 | Freq: Two times a day (BID) | CUTANEOUS | 1 refills | Status: DC | PRN

## 2023-03-03 DIAGNOSIS — J432 Centrilobular emphysema: Secondary | ICD-10-CM | POA: Diagnosis not present

## 2023-03-03 DIAGNOSIS — G4733 Obstructive sleep apnea (adult) (pediatric): Secondary | ICD-10-CM | POA: Diagnosis not present

## 2023-03-03 DIAGNOSIS — J9611 Chronic respiratory failure with hypoxia: Secondary | ICD-10-CM | POA: Diagnosis not present

## 2023-03-03 DIAGNOSIS — Z6841 Body Mass Index (BMI) 40.0 and over, adult: Secondary | ICD-10-CM | POA: Diagnosis not present

## 2023-03-03 DIAGNOSIS — I10 Essential (primary) hypertension: Secondary | ICD-10-CM | POA: Diagnosis not present

## 2023-03-03 DIAGNOSIS — E1165 Type 2 diabetes mellitus with hyperglycemia: Secondary | ICD-10-CM | POA: Diagnosis not present

## 2023-03-03 DIAGNOSIS — E1140 Type 2 diabetes mellitus with diabetic neuropathy, unspecified: Secondary | ICD-10-CM | POA: Diagnosis not present

## 2023-03-03 DIAGNOSIS — J8410 Pulmonary fibrosis, unspecified: Secondary | ICD-10-CM | POA: Diagnosis not present

## 2023-03-03 DIAGNOSIS — J449 Chronic obstructive pulmonary disease, unspecified: Secondary | ICD-10-CM | POA: Diagnosis not present

## 2023-03-03 DIAGNOSIS — Z794 Long term (current) use of insulin: Secondary | ICD-10-CM | POA: Diagnosis not present

## 2023-03-04 DIAGNOSIS — M1711 Unilateral primary osteoarthritis, right knee: Secondary | ICD-10-CM | POA: Diagnosis not present

## 2023-03-05 ENCOUNTER — Other Ambulatory Visit: Payer: Self-pay | Admitting: Family Medicine

## 2023-03-05 DIAGNOSIS — J309 Allergic rhinitis, unspecified: Secondary | ICD-10-CM

## 2023-03-09 ENCOUNTER — Ambulatory Visit (HOSPITAL_BASED_OUTPATIENT_CLINIC_OR_DEPARTMENT_OTHER): Payer: PPO | Attending: Family Medicine | Admitting: Physical Therapy

## 2023-03-09 ENCOUNTER — Telehealth: Payer: Self-pay | Admitting: Family Medicine

## 2023-03-09 DIAGNOSIS — R5381 Other malaise: Secondary | ICD-10-CM | POA: Diagnosis not present

## 2023-03-09 DIAGNOSIS — J309 Allergic rhinitis, unspecified: Secondary | ICD-10-CM

## 2023-03-09 DIAGNOSIS — R2689 Other abnormalities of gait and mobility: Secondary | ICD-10-CM | POA: Diagnosis not present

## 2023-03-09 NOTE — Telephone Encounter (Signed)
Prescription Request  03/09/2023  LOV: 12/05/2022  What is the name of the medication or equipment? hydrOXYzine (VISTARIL) 25 MG capsule says pharmacy told her it was declined by provider  Have you contacted your pharmacy to request a refill? Yes   Which pharmacy would you like this sent to?  [id number] - [place], [place] - [address] [place] [address] Phone: [phone] Fax: [phone]     Patient notified that their request is being sent to the clinical staff for review and that they should receive a response within 2 business days.   Please advise at Mobile [phone] (mobile)

## 2023-03-09 NOTE — Therapy (Unsigned)
OUTPATIENT PHYSICAL THERAPY LOWER EXTREMITY treatment    Patient Name: Bonnie Gonzalez MRN: [id number] DOB:1956/04/20, 67 y.o., female Today's Date: 02/17/2023  END OF SESSION:  PT End of Session - 02/17/23 1330     Visit Number 8    Number of Visits 16    Date for PT Re-Evaluation 03/12/23    PT Start Time 1300    PT Stop Time 1345    PT Time Calculation (min) 45 min    Activity Tolerance Patient tolerated treatment well    [facility] assessed/performed             Progress Note Reporting Period *** to 03/09/2023  See note below for Objective Data and Assessment of Progress/Goals.        Past Medical History:  Diagnosis Date   Acquired hypothyroidism 10/01/2010   Allergic rhinitis 07/20/2009   Arthritis    Back pain 10/18/2015   Bilateral leg edema 03/31/2018   Centrilobular emphysema 02/04/2019   HRCT 09/15/18   Chronic diastolic CHF (congestive heart failure) 01/28/2019   Chronic respiratory failure with hypoxia 02/04/2019   [facility]   Chronic urticaria 12/21/2019   COPD (chronic obstructive pulmonary disease) 09/01/2018   Quit smoking 2010  - Spirometry 08/31/2018  FEV1 1.1 (51%)  Ratio 66 s prior rx with typical curvature  - 08/31/2018  After extensive coaching inhaler device,  effectiveness =    90% with elipta > try anoro sample > no better so d/c'  - PFT's  11/10/2018  FEV1 1.24 (59 % ) ratio 75 (ratio with fev1/VC =  63)  p no % improvement from saba p nothing prior to study with DLCO  105  % corrects to 132  %    DOE (dyspnea on exertion) 08/31/2018   Onset 05/2018 with background of unexplained leg swelling x 01/2018   -echo 04/13/18 just G1   diastolic dysfunction s PH   -  08/31/2018   Walked RA x one lap @ 185 stopped due to  Sob/ sats 89% at nl pace  - Spirometry 08/31/2018  FEV1 1.1 (51%)  Ratio 66 s prior rx  - 09/28/2018 could not do full pfts 09/28/2018  - Collagen vasc profile 09/28/18  For ESR =   54 (was 102 before prednisone)  Neg collag   Dyshidrotic eczema 12/13/2010   Essential hypertension 01/26/2019   Generalized anxiety disorder with panic attacks 01/20/2018   GERD (gastroesophageal reflux disease) 11/03/2007   Headache 11/03/2007   Irritable bowel syndrome 07/13/2009   Major depressive disorder 07/17/2020   Mixed hyperlipidemia 07/20/2009   Obesity    OSA (obstructive sleep apnea) 07/26/2015   HST 08/2015 mild OSA, AHI 8/hour, lowest desaturation 81%, desaturations noted without respiratory events  Formatting of this note might be different from the original. PSG 05/20/19 AHI 25.1   Osteopenia of multiple sites 06/01/2019   DEXA 05/24/2019: Right femur neck T- 2.2, left femur neck T- 1.5, right total femur T -0.5, left total femur T -0.5, AP total spine T- 1.9. FRAX 10-year probability of major osteoporotic fracture is 8.8% and a hip fracture is 1.2%  DEXA 05/05/2019: Right femur neck T- 1.9, left femur neck T- 2.0, right total femur T -1.3, left total femur   Oxygen deficiency    Postinflammatory pulmonary fibrosis 09/15/2018   ESR    10/161/9  = 102   > rx short term prednisone ? slt improvement in symptoms HRCT  09/15/2018  1. Very mild basilar subpleural ground-glass, reticulation and traction bronchiolectasis, findings which may be minimally progressive from 06/29/2017 and are indicative of interstitial lung disease such as nonspecific interstitial pneumonitis. Findings are indeterminate for UIP per consensus guidelin   Psychophysiological insomnia 03/29/2019   Type 2 diabetes mellitus 02/09/2017   UTI (urinary tract infection)    Vertigo 03/29/2014   Vitamin D deficiency 09/15/2020   Past Surgical History:  Procedure Laterality Date   ANKLE FRACTURE SURGERY Right    benign tumor      removed from groin   COLONOSCOPY WITH PROPOFOL N/A 05/15/2022   Procedure: COLONOSCOPY WITH PROPOFOL;  Surgeon: Napoleon Form, MD;  Location: [facility];  Service: Gastroenterology;   Laterality: N/A;   HOT HEMOSTASIS N/A 05/15/2022   Procedure: HOT HEMOSTASIS (ARGON PLASMA COAGULATION/BICAP);  Surgeon: Napoleon Form, MD;  Location: [facility];  Service: Gastroenterology;  Laterality: N/A;   POLYPECTOMY  05/15/2022   Procedure: POLYPECTOMY;  Surgeon: Napoleon Form, MD;  Location: [facility];  Service: Gastroenterology;;   WRIST ARTHROCENTESIS N/A 2001   Patient Active Problem List   Diagnosis Date Noted   Morbid obesity (HCC) 06/16/2022   Physical deconditioning 06/16/2022   AVM (arteriovenous malformation) of colon, acquired with hemorrhage    Positive colorectal cancer screening using Cologuard test    Polyp of sigmoid colon    Polyp of transverse colon    Polyp of cecum    COVID-19 virus infection 11/21/2021   Vitamin D deficiency 09/15/2020   Major depressive disorder 07/17/2020   Chronic urticaria 12/21/2019   Osteopenia of multiple sites 06/01/2019   Psychophysiological insomnia 03/29/2019   Centrilobular emphysema 02/04/2019   Chronic respiratory failure with hypoxia 02/04/2019   Chronic diastolic CHF (congestive heart failure) 01/28/2019   Essential hypertension 01/26/2019   Postinflammatory pulmonary fibrosis 09/15/2018   COPD (chronic obstructive pulmonary disease) 09/01/2018   DOE (dyspnea on exertion) 08/31/2018   Bilateral leg edema 03/31/2018   Generalized anxiety disorder with panic attacks 01/20/2018   Type 2 diabetes mellitus 02/09/2017   Back pain 10/18/2015   OSA (obstructive sleep apnea) 07/26/2015   Vertigo 03/29/2014   Dyshidrotic eczema 12/13/2010   Acquired hypothyroidism 10/01/2010   Mixed hyperlipidemia 07/20/2009   Allergic rhinitis 07/20/2009   Irritable bowel syndrome 07/13/2009   GERD (gastroesophageal reflux disease) 11/03/2007   Headache 11/03/2007    PCP: Gershon Crane MD   REFERRING PROVIDER: Gershon Crane MD   REFERRING DIAG:  Diagnosis  J96.11 (ICD-10-CM) - Chronic respiratory failure with hypoxia  (HCC)    THERAPY DIAG:  Other abnormalities of gait and mobility  Physical deconditioning  Rationale for Evaluation and Treatment: Rehabilitation  ONSET DATE: has been on O2 for 3 years   SUBJECTIVE:   SUBJECTIVE STATEMENT: Patient reports some discomfort on her knees and hips bilaterally. Patient O2 level was 88 at the entry of therapy. Patient has noticed some shortness of breathe when walking around the house.    PERTINENT HISTORY: Knees, Loweback, and wrist arthritis. CHF taking lasix, post inflamitory pulmonary fibrosis; vertigo ( still has syncope at times)  PAIN:  Are you having pain? Yes: NPRS scale: 4/10 Pain location: Left knee Pain description: Aching Aggravating factors: Standing walking Relieving factors: Sitting  PRECAUTIONS: falls   WEIGHT BEARING RESTRICTIONS: No  FALLS:  Has patient fallen in last 6 months? No but has ad some near falls   LIVING ENVIRONMENT: Ramp into the house   OCCUPATION: retired  Recreation: would like to  be able to get back to gardening  PLOF: Independent with household mobility with device  PATIENT GOALS: To increase endurance  NEXT MD VISIT: Nothing scheduled  OBJECTIVE:   DIAGNOSTIC FINDINGS: Nothing of the knee  PATIENT SURVEYS:  FOTO deferred 2nd to time   COGNITION: Overall cognitive status: Within functional limits for tasks assessed     SENSATION: Has had tinglining in her hands. Has been using wrist guards  EDEMA:   MUSCLE LENGTH:  POSTURE: rounded shoulders, forward head, and flexed trunk   PALPATION: No unexpected tenderness palpation  LOWER EXTREMITY ROM:  Passive ROM Right eval Left eval  Hip flexion    Hip extension    Hip abduction    Hip adduction    Hip internal rotation    Hip external rotation    Knee flexion  Painful at end range  Knee extension    Ankle dorsiflexion    Ankle plantarflexion    Ankle inversion    Ankle eversion     (Blank rows = not tested)  LOWER EXTREMITY  MMT:  MMT Right eval Left eval Right 4/8 left  Hip flexion 19.7 17.0 33.4 29  Hip extension      Hip abduction 36.9 40.3 35 40.9  Hip adduction      Hip internal rotation      Hip external rotation      Knee flexion      Knee extension 20 18.7 45 35  Ankle dorsiflexion      Ankle plantarflexion      Ankle inversion      Ankle eversion       (Blank rows = not tested)  LOWER EXTREMITY SPECIAL TESTS:   IE: 5x sit to stand: 24 sec   Hr 85  Sao2 96   01/26/23: walk test: 286ft total- seated rest break at 138ft and paused timer.  GAIT: Ambulated 167ft x2 at beginning of session. Seated rest break between. SpO2 was 90% afterwards. Rose to 97%  Ambulated 114ft x2 at end of session. O2 remained at 97-98% this time.  TODAY'S TREATMENT:                                                                                                                               DATE:  Nu-step L5 x39mins Full lap  Biceps curls 3x10 2lbs Chest press 3x10 2lbs Overhead shoulder press 3x10 2lbs     3/22 Nu-step L3 biceps curls 2x10 2lbs  sitting chest press 2lb 2x10   Seated hip abduction with red band 2x10  Seated shoulder ER with yellow band 2x10 Seated shoulder scaption with yellow band 2x10 Seated hip adduction ball squeeze 2x10  Ankle DF 2x10  Ankle heel rises 2x10  ambulation 150 2x with monitoring of sao2      3/19 Nu-step L3  - biceps curls 2x10 2lbs   - sitting chest press 2lb 2x10     -  Sits to stand 3x5   - ambulation 150 6x with monitoring of sao2  See below for Sao2   -Standing marching 2x10 -Standing heel raise 2x10 -standing hip abd 2x10 -standing hip extension 1x10  3/15 Nu-step L3  - biceps curls 2x10 2lbs  - alt 1lb 2x10  - sitting chest press 2lb 2x10     - Sits to stand 3x5   - ambulation 150 3x with monitoring of sao2  See below for Sao2   3/12  Nu-step L3 After 89%  -Gait- 4x166ft beginning of session, seated  rest break between (3L of O2 for walking)   Bilateral er 2x10 yellow  Bilateral horizontal abduction 2x10 yellow   Seated hip abduction yellow  2x10- feet on step Seated March 2x10 yellow    -Standing marching 2x10 -Standing heel raise 2x10 -partial squats- unable due to L knee  The patients Sao2 was checked frequently and ranged between 89 and 99%   3/6 Nu-step L3 5:15 368 steps  Baseline Sao2 91%  After 93%    Bilateral er 2x10 yellow  Bilateral horizontal abduction 2x10 yellow  Bilateral flexion  2x10 yellow band    Seated hip abduction 2x10  Seated March 2x10 yellow  Seated LAQ 2x10 each leg yellow  Ball squeeze 2x10   The patients Sao2 was checked frequently and ranged between 88 and 98         PATIENT EDUCATION:  Education details: HEP, symptom management, activity progression Person educated: Patient Education method: Explanation, Demonstration, Tactile cues, and Verbal cues Education comprehension: verbalized understanding, returned demonstration, verbal cues required, tactile cues required, and needs further education  HOME EXERCISE PROGRAM: Access Code: [email or web address] URL: [email or web address] Date: 01/19/2023 Prepared by: Riki Altes  Exercises - Heel Raises with Counter Support  - 1-2 x daily - 7 x weekly - 2 sets - 10 reps - Standing March with Counter Support  - 1-2 x daily - 7 x weekly - 2 sets - 10 reps - Standing Hip Abduction with Counter Support  - 1-2 x daily - 7 x weekly - 2 sets - 10 reps - Standing Hip Extension with Counter Support  - 1-2 x daily - 7 x weekly - 2 sets - 10 reps - Mini Squat with Counter Support  - 1-2 x daily - 7 x weekly - 2 sets - 10 reps - Standing Knee Flexion with Counter Support  - 1-2 x daily - 7 x weekly - 2 sets - 10 reps  ASSESSMENT:  CLINICAL IMPRESSION: Patient had a great session, with minor discomfort in the beginning of the session in her right posterior knee. Pt did the nu step for 6 mins  with no increase of pain. Throughout the session we monitored Pt O2 level in stayed within the 90-96% O2 level. Patient ambulated 2x128ft and maintain a 94-96% O2 level. Will continue to monitor O2 levels over time. Patient performed seated ankle pumps and ankle DF with a 94% O2 level. Will continue to progress strengthening and endurance as tolerated.   OBJECTIVE IMPAIRMENTS: Abnormal gait, cardiopulmonary status limiting activity, decreased activity tolerance, decreased balance, decreased endurance, decreased mobility, difficulty walking, decreased ROM, decreased strength, and pain.   ACTIVITY LIMITATIONS: carrying, lifting, bending, standing, squatting, stairs, transfers, bathing, dressing, and locomotion level  PARTICIPATION LIMITATIONS: meal prep, cleaning, laundry, driving, shopping, community activity, and yard work  PERSONAL FACTORS: 3+ comorbidities: Knees, Loweback, and wrist arthritis. CHF taking lasix, post inflamitory pulmonary fibrosis; vertigo ( still has syncope  at times) are also affecting patient's functional outcome.   REHAB POTENTIAL: Good  CLINICAL DECISION MAKING: Evolving/moderate complexity declining overall mobility   EVALUATION COMPLEXITY: Moderate   GOALS: Goals reviewed with patient? Yes  SHORT TERM GOALS: Target date: 02/13/2023   Therapy will perform 6-minute walk test with patient and come up with baseline goals Baseline: Goal status: MET 01/26/23  2.  Patient will decrease sit to stand time time by 5 seconds Baseline:  Goal status: INITIAL  3.  Patient will perform 10 minutes of interval training on NuStep while maintaining a heart rate less than 110 Baseline:  Goal status: INITIAL LONG TERM GOALS: Target date: 03/13/2023    Patient will have a complete exercise program that she can take to [employer] sneakers and continue long-term Baseline:  Goal status: INITIAL  2.  Patient will ambulate 600 feet without self-report of dyspnea in order to attend  appointments without being short of breath Baseline:  Goal status: INITIAL  3.  Patient will stand for greater than 20 minutes without increased pain in left knee and increased dyspnea in order to perform ADLs Baseline:  Goal status: INITIAL    PLAN:  PT FREQUENCY: 2x/week  PT DURATION: 8 weeks  PLANNED INTERVENTIONS: Therapeutic exercises, Therapeutic activity, Neuromuscular re-education, Balance training, Gait training, Patient/Family education, Self Care, Joint mobilization, Stair training, DME instructions, Aquatic Therapy, Dry Needling, Electrical stimulation, Spinal mobilization, Cryotherapy, Moist heat, Ultrasound, and Manual therapy  PLAN FOR NEXT SESSION: 3 or 6 minute walk test. Begin interval training on the nu-step.    Donnel Saxon Hodor, PTA 02/17/2023, 2:03 PM

## 2023-03-10 NOTE — Therapy (Signed)
OUTPATIENT PHYSICAL THERAPY LOWER EXTREMITY treatment    Patient Name: Bonnie Gonzalez MRN: [id number] DOB:Jul 27, 1956, 67 y.o., female Today's Date: 03/10/2023  END OF SESSION:  PT End of Session - 03/09/23 1407     Visit Number 10    Number of Visits 22    Date for PT Re-Evaluation 04/20/23    Authorization Type progress note done on visit 10    PT Start Time 1345    PT Stop Time 1427    PT Time Calculation (min) 42 min    Activity Tolerance Patient tolerated treatment well    Behavior During Therapy [facility] for tasks assessed/performed             Progress Note Reporting Period 01/15/2023 to 03/09/2023  See note below for Objective Data and Assessment of Progress/Goals.        Past Medical History:  Diagnosis Date   Acquired hypothyroidism 10/01/2010   Allergic rhinitis 07/20/2009   Arthritis    Back pain 10/18/2015   Bilateral leg edema 03/31/2018   Centrilobular emphysema 02/04/2019   HRCT 09/15/18   Chronic diastolic CHF (congestive heart failure) 01/28/2019   Chronic respiratory failure with hypoxia 02/04/2019   6 Min Walk 08/31/18 at [facility]   Chronic urticaria 12/21/2019   COPD (chronic obstructive pulmonary disease) 09/01/2018   Quit smoking 2010  - Spirometry 08/31/2018  FEV1 1.1 (51%)  Ratio 66 s prior rx with typical curvature  - 08/31/2018  After extensive coaching inhaler device,  effectiveness =    90% with elipta > try anoro sample > no better so d/c'  - PFT's  11/10/2018  FEV1 1.24 (59 % ) ratio 75 (ratio with fev1/VC =  63)  p no % improvement from saba p nothing prior to study with DLCO  105  % corrects to 132  %    DOE (dyspnea on exertion) 08/31/2018   Onset 05/2018 with background of unexplained leg swelling x 01/2018   -echo 04/13/18 just G1   diastolic dysfunction s PH   -  08/31/2018   Walked RA x one lap @ 185 stopped due to  Sob/ sats 89% at nl pace  - Spirometry 08/31/2018  FEV1 1.1 (51%)  Ratio 66 s prior rx  - 09/28/2018 could not do full  pfts 09/28/2018  - Collagen vasc profile 09/28/18  For ESR =  54 (was 102 before prednisone)  Neg collag   Dyshidrotic eczema 12/13/2010   Essential hypertension 01/26/2019   Generalized anxiety disorder with panic attacks 01/20/2018   GERD (gastroesophageal reflux disease) 11/03/2007   Headache 11/03/2007   Irritable bowel syndrome 07/13/2009   Major depressive disorder 07/17/2020   Mixed hyperlipidemia 07/20/2009   Obesity    OSA (obstructive sleep apnea) 07/26/2015   HST 08/2015 mild OSA, AHI 8/hour, lowest desaturation 81%, desaturations noted without respiratory events  Formatting of this note might be different from the original. PSG 05/20/19 AHI 25.1   Osteopenia of multiple sites 06/01/2019   DEXA 05/24/2019: Right femur neck T- 2.2, left femur neck T- 1.5, right total femur T -0.5, left total femur T -0.5, AP total spine T- 1.9. FRAX 10-year probability of major osteoporotic fracture is 8.8% and a hip fracture is 1.2%  DEXA 05/05/2019: Right femur neck T- 1.9, left femur neck T- 2.0, right total femur T -1.3, left total femur   Oxygen deficiency    Postinflammatory pulmonary fibrosis 09/15/2018   ESR    10/161/9  = 102   >  rx short term prednisone ? slt improvement in symptoms HRCT 09/15/2018  1. Very mild basilar subpleural ground-glass, reticulation and traction bronchiolectasis, findings which may be minimally progressive from 06/29/2017 and are indicative of interstitial lung disease such as nonspecific interstitial pneumonitis. Findings are indeterminate for UIP per consensus guidelin   Psychophysiological insomnia 03/29/2019   Type 2 diabetes mellitus 02/09/2017   UTI (urinary tract infection)    Vertigo 03/29/2014   Vitamin D deficiency 09/15/2020   Past Surgical History:  Procedure Laterality Date   ANKLE FRACTURE SURGERY Right    benign tumor      removed from groin   COLONOSCOPY WITH PROPOFOL N/A 05/15/2022   Procedure: COLONOSCOPY WITH PROPOFOL;  Surgeon: Napoleon Form, MD;  Location: [facility];  Service: Gastroenterology;  Laterality: N/A;   HOT HEMOSTASIS N/A 05/15/2022   Procedure: HOT HEMOSTASIS (ARGON PLASMA COAGULATION/BICAP);  Surgeon: Napoleon Form, MD;  Location: [facility];  Service: Gastroenterology;  Laterality: N/A;   POLYPECTOMY  05/15/2022   Procedure: POLYPECTOMY;  Surgeon: Napoleon Form, MD;  Location: [facility];  Service: Gastroenterology;;   WRIST ARTHROCENTESIS N/A 2001   Patient Active Problem List   Diagnosis Date Noted   Morbid obesity 06/16/2022   Physical deconditioning 06/16/2022   AVM (arteriovenous malformation) of colon, acquired with hemorrhage    Positive colorectal cancer screening using Cologuard test    Polyp of sigmoid colon    Polyp of transverse colon    Polyp of cecum    COVID-19 virus infection 11/21/2021   Vitamin D deficiency 09/15/2020   Major depressive disorder 07/17/2020   Chronic urticaria 12/21/2019   Osteopenia of multiple sites 06/01/2019   Psychophysiological insomnia 03/29/2019   Centrilobular emphysema 02/04/2019   Chronic respiratory failure with hypoxia 02/04/2019   Chronic diastolic CHF (congestive heart failure) 01/28/2019   Essential hypertension 01/26/2019   Postinflammatory pulmonary fibrosis 09/15/2018   COPD (chronic obstructive pulmonary disease) 09/01/2018   DOE (dyspnea on exertion) 08/31/2018   Bilateral leg edema 03/31/2018   Generalized anxiety disorder with panic attacks 01/20/2018   Type 2 diabetes mellitus 02/09/2017   Back pain 10/18/2015   OSA (obstructive sleep apnea) 07/26/2015   Vertigo 03/29/2014   Dyshidrotic eczema 12/13/2010   Acquired hypothyroidism 10/01/2010   Mixed hyperlipidemia 07/20/2009   Allergic rhinitis 07/20/2009   Irritable bowel syndrome 07/13/2009   GERD (gastroesophageal reflux disease) 11/03/2007   Headache 11/03/2007    PCP: Gershon Crane MD   REFERRING PROVIDER: Gershon Crane MD   REFERRING DIAG:  Diagnosis  J96.11  (ICD-10-CM) - Chronic respiratory failure with hypoxia (HCC)    THERAPY DIAG:  Other abnormalities of gait and mobility  Physical deconditioning  Rationale for Evaluation and Treatment: Rehabilitation  ONSET DATE: has been on O2 for 3 years   SUBJECTIVE:   SUBJECTIVE STATEMENT: Patient reports some discomfort on her knees and hips bilaterally. Patient O2 level was 88 at the entry of therapy. Patient has noticed some shortness of breathe when walking around the house.   PERTINENT HISTORY: Knees, Loweback, and wrist arthritis. CHF taking lasix, post inflamitory pulmonary fibrosis; vertigo ( still has syncope at times)  PAIN:  Are you having pain? Yes: NPRS scale: 4/10 Pain location: Left knee Pain description: Aching Aggravating factors: Standing walking Relieving factors: Sitting  PRECAUTIONS: falls   WEIGHT BEARING RESTRICTIONS: No  FALLS:  Has patient fallen in last 6 months? No but has ad some near falls   LIVING ENVIRONMENT: Ramp into the house  OCCUPATION: retired  Recreation: would like to be able to get back to gardening  PLOF: Independent with household mobility with device  PATIENT GOALS: To increase endurance  NEXT MD VISIT: Nothing scheduled  OBJECTIVE:   DIAGNOSTIC FINDINGS: Nothing of the knee  PATIENT SURVEYS:  FOTO deferred 2nd to time   COGNITION: Overall cognitive status: Within functional limits for tasks assessed     SENSATION: Has had tinglining in her hands. Has been using wrist guards  EDEMA:   MUSCLE LENGTH:  POSTURE: rounded shoulders, forward head, and flexed trunk   PALPATION: No unexpected tenderness palpation  LOWER EXTREMITY ROM:  Passive ROM Right eval Left eval  Hip flexion    Hip extension    Hip abduction    Hip adduction    Hip internal rotation    Hip external rotation    Knee flexion  Painful at end range  Knee extension    Ankle dorsiflexion    Ankle plantarflexion    Ankle inversion    Ankle  eversion     (Blank rows = not tested)  LOWER EXTREMITY MMT:  MMT Right eval Left eval Right 4/8 Left 4//8  Hip flexion 19.7 17.0 33.4 29  Hip extension      Hip abduction 36.9 40.3 35 40.9  Hip adduction      Hip internal rotation      Hip external rotation      Knee flexion      Knee extension 20 18.7 45 35  Ankle dorsiflexion      Ankle plantarflexion      Ankle inversion      Ankle eversion       (Blank rows = not tested)  LOWER EXTREMITY SPECIAL TESTS:   IE: 5x sit to stand: 24 sec   Hr 85  Sao2 96   01/26/23: walk test: 25ft total- seated rest break at 15ft and paused timer.  GAIT: Ambulated 192ft x2 at beginning of session. Seated rest break between. SpO2 was 90% afterwards. Rose to 97%  Ambulated 17ft x2 at end of session. O2 remained at 97-98% this time.  TODAY'S TREATMENT:                                                                                                                               DATE:  4/9 Nu-step L5 x18mins 150 ft, rest break then another 100 ft of walking Biceps curls 3x10 2lbs Chest press 3x10 2lbs Overhead shoulder press 3x10 2lbs  Yellow band flexion 3x10 Yellow band horizontal abduction 3x10 Yellow band ER 3x10 Heel raise 3x10  Performed progress note of patient, review goals and plan of care  3/22 Nu-step L3 biceps curls 2x10 2lbs  sitting chest press 2lb 2x10   Seated hip abduction with red band 2x10  Seated shoulder ER with yellow band 2x10 Seated shoulder scaption with yellow band 2x10 Seated hip adduction ball squeeze 2x10  Ankle DF  2x10  Ankle heel rises 2x10  ambulation 150 2x with monitoring of sao2      3/19 Nu-step L3  - biceps curls 2x10 2lbs   - sitting chest press 2lb 2x10     - Sits to stand 3x5   - ambulation 150 6x with monitoring of sao2  See below for Sao2   -Standing marching 2x10 -Standing heel raise 2x10 -standing hip abd 2x10 -standing hip extension  1x10  3/15 Nu-step L3  - biceps curls 2x10 2lbs  - alt 1lb 2x10  - sitting chest press 2lb 2x10     - Sits to stand 3x5   - ambulation 150 3x with monitoring of sao2  See below for Sao2   3/12  Nu-step L3 After 89%  -Gait- 4x18ft beginning of session, seated rest break between (3L of O2 for walking)   Bilateral er 2x10 yellow  Bilateral horizontal abduction 2x10 yellow   Seated hip abduction yellow  2x10- feet on step Seated March 2x10 yellow    -Standing marching 2x10 -Standing heel raise 2x10 -partial squats- unable due to L knee  The patients Sao2 was checked frequently and ranged between 89 and 99%   3/6 Nu-step L3 5:15 368 steps  Baseline Sao2 91%  After 93%    Bilateral er 2x10 yellow  Bilateral horizontal abduction 2x10 yellow  Bilateral flexion  2x10 yellow band    Seated hip abduction 2x10  Seated March 2x10 yellow  Seated LAQ 2x10 each leg yellow  Ball squeeze 2x10   The patients Sao2 was checked frequently and ranged between 88 and 98         PATIENT EDUCATION:  Education details: HEP, symptom management, activity progression Person educated: Patient Education method: Explanation, Demonstration, Tactile cues, and Verbal cues Education comprehension: verbalized understanding, returned demonstration, verbal cues required, tactile cues required, and needs further education  HOME EXERCISE PROGRAM: Access Code: [email or web address] URL: [email or web address] Date: 01/19/2023 Prepared by: Riki Altes  Exercises - Heel Raises with Counter Support  - 1-2 x daily - 7 x weekly - 2 sets - 10 reps - Standing March with Counter Support  - 1-2 x daily - 7 x weekly - 2 sets - 10 reps - Standing Hip Abduction with Counter Support  - 1-2 x daily - 7 x weekly - 2 sets - 10 reps - Standing Hip Extension with Counter Support  - 1-2 x daily - 7 x weekly - 2 sets - 10 reps - Mini Squat with Counter Support  - 1-2 x daily - 7 x weekly - 2  sets - 10 reps - Standing Knee Flexion with Counter Support  - 1-2 x daily - 7 x weekly - 2 sets - 10 reps  ASSESSMENT:  CLINICAL IMPRESSION: Patient had a great session. Pt did the nu step for 6 mins with no increase of pain at level 5. Throughout the session we monitored Pt O2 level in stayed within the 90-97% O2 level. Will continue to monitor O2 levels over time. Patient was able to ambulate a full lab without shortness of breath. Will continue to progress strengthening and endurance as tolerated. Measured patient strength, overall we are improving and trending in the right direction. Patient is ambulating a mild improvement with gait distance and is not having any shortness of breath unless walking for long distances. Patient will benefit from skill physical therapy 2 times a week for 8 weeks to increase functional mobility and endurance to ambulate in  community.    OBJECTIVE IMPAIRMENTS: Abnormal gait, cardiopulmonary status limiting activity, decreased activity tolerance, decreased balance, decreased endurance, decreased mobility, difficulty walking, decreased ROM, decreased strength, and pain.   ACTIVITY LIMITATIONS: carrying, lifting, bending, standing, squatting, stairs, transfers, bathing, dressing, and locomotion level  PARTICIPATION LIMITATIONS: meal prep, cleaning, laundry, driving, shopping, community activity, and yard work  PERSONAL FACTORS: 3+ comorbidities: Knees, Loweback, and wrist arthritis. CHF taking lasix, post inflamitory pulmonary fibrosis; vertigo ( still has syncope at times) are also affecting patient's functional outcome.   REHAB POTENTIAL: Good  CLINICAL DECISION MAKING: Evolving/moderate complexity declining overall mobility   EVALUATION COMPLEXITY: Moderate   GOALS: Goals reviewed with patient? Yes  SHORT TERM GOALS: Target date: 02/13/2023   Therapy will perform 6-minute walk test with patient and come up with baseline goals Baseline: Goal status: MET  01/26/23  2.  Patient will decrease sit to stand time time by 5 seconds Baseline:  Goal status: not tested 4/8  3.  Patient will perform 10 minutes of interval training on NuStep while maintaining a heart rate less than 110 Baseline:  Goal status: Met 4/8  LONG TERM GOALS: Target date: 03/13/2023    Patient will have a complete exercise program that she can take to [employer] sneakers and continue long-term Baseline:  Goal status: progressing towards program  2.  Patient will ambulate 600 feet without self-report of dyspnea in order to attend appointments without being short of breath Baseline:  Goal status:  not achieved, in progress 4/9  3.  Patient will stand for greater than 20 minutes without increased pain in left knee and increased dyspnea in order to perform ADLs Baseline:  Goal status: In progress 4/9    PLAN:  PT FREQUENCY: 2x/week  PT DURATION: 8 weeks  PLANNED INTERVENTIONS: Therapeutic exercises, Therapeutic activity, Neuromuscular re-education, Balance training, Gait training, Patient/Family education, Self Care, Joint mobilization, Stair training, DME instructions, Aquatic Therapy, Dry Needling, Electrical stimulation, Spinal mobilization, Cryotherapy, Moist heat, Ultrasound, and Manual therapy  PLAN FOR NEXT SESSION: retest sit to stand, continue to progress patient HEP. Consider standing exercise, mini squats and standing marches. Progress to stair training and airex pad for balance.    Lorayne Bender PT DPT  03/11/2023   Cristal Ford, Student-PT 03/10/2023, 8:56 AM   I have reviewed and concur with this student's documentation.   During this treatment session, the therapist was present, participating in and directing the treatment.   Dessie Coma, PT 03/11/2023 8:26 AM

## 2023-03-11 DIAGNOSIS — I5032 Chronic diastolic (congestive) heart failure: Secondary | ICD-10-CM | POA: Diagnosis not present

## 2023-03-11 DIAGNOSIS — J449 Chronic obstructive pulmonary disease, unspecified: Secondary | ICD-10-CM | POA: Diagnosis not present

## 2023-03-11 MED ORDER — HYDROXYZINE PAMOATE 25 MG PO CAPS: ORAL_CAPSULE | ORAL | 1 refills | Status: DC

## 2023-03-11 NOTE — Telephone Encounter (Signed)
Rx sent 

## 2023-03-11 NOTE — Addendum Note (Signed)
Addended byConrad Atkins D on: 03/11/2023 11:26 AM   Modules accepted: Orders

## 2023-03-12 ENCOUNTER — Ambulatory Visit (HOSPITAL_BASED_OUTPATIENT_CLINIC_OR_DEPARTMENT_OTHER): Payer: PPO

## 2023-03-13 ENCOUNTER — Ambulatory Visit (HOSPITAL_BASED_OUTPATIENT_CLINIC_OR_DEPARTMENT_OTHER): Payer: PPO

## 2023-03-13 ENCOUNTER — Encounter (HOSPITAL_BASED_OUTPATIENT_CLINIC_OR_DEPARTMENT_OTHER): Payer: Self-pay

## 2023-03-13 DIAGNOSIS — R2689 Other abnormalities of gait and mobility: Secondary | ICD-10-CM | POA: Diagnosis not present

## 2023-03-13 NOTE — Therapy (Signed)
OUTPATIENT PHYSICAL THERAPY LOWER EXTREMITY treatment    Patient Name: Erin Cerita Rabelo MRN: [id number] DOB:1956-01-13, 67 y.o., female Today's Date: 03/13/2023  END OF SESSION:  PT End of Session - 03/13/23 1355     Visit Number 11    Number of Visits 22    Date for PT Re-Evaluation 04/20/23    Authorization Type progress note done on visit 10    PT Start Time 1347    PT Stop Time 1432    PT Time Calculation (min) 45 min    Activity Tolerance Patient tolerated treatment well    Behavior During Therapy [facility] for tasks assessed/performed             Progress Note Reporting Period 01/15/2023 to 03/09/2023  See note below for Objective Data and Assessment of Progress/Goals.        Past Medical History:  Diagnosis Date   Acquired hypothyroidism 10/01/2010   Allergic rhinitis 07/20/2009   Arthritis    Back pain 10/18/2015   Bilateral leg edema 03/31/2018   Centrilobular emphysema 02/04/2019   HRCT 09/15/18   Chronic diastolic CHF (congestive heart failure) 01/28/2019   Chronic respiratory failure with hypoxia 02/04/2019   6 Min Walk 08/31/18 at [facility]   Chronic urticaria 12/21/2019   COPD (chronic obstructive pulmonary disease) 09/01/2018   Quit smoking 2010  - Spirometry 08/31/2018  FEV1 1.1 (51%)  Ratio 66 s prior rx with typical curvature  - 08/31/2018  After extensive coaching inhaler device,  effectiveness =    90% with elipta > try anoro sample > no better so d/c'  - PFT's  11/10/2018  FEV1 1.24 (59 % ) ratio 75 (ratio with fev1/VC =  63)  p no % improvement from saba p nothing prior to study with DLCO  105  % corrects to 132  %    DOE (dyspnea on exertion) 08/31/2018   Onset 05/2018 with background of unexplained leg swelling x 01/2018   -echo 04/13/18 just G1   diastolic dysfunction s PH   -  08/31/2018   Walked RA x one lap @ 185 stopped due to  Sob/ sats 89% at nl pace  - Spirometry 08/31/2018  FEV1 1.1 (51%)  Ratio 66 s prior rx  - 09/28/2018 could not do full  pfts 09/28/2018  - Collagen vasc profile 09/28/18  For ESR =  54 (was 102 before prednisone)  Neg collag   Dyshidrotic eczema 12/13/2010   Essential hypertension 01/26/2019   Generalized anxiety disorder with panic attacks 01/20/2018   GERD (gastroesophageal reflux disease) 11/03/2007   Headache 11/03/2007   Irritable bowel syndrome 07/13/2009   Major depressive disorder 07/17/2020   Mixed hyperlipidemia 07/20/2009   Obesity    OSA (obstructive sleep apnea) 07/26/2015   HST 08/2015 mild OSA, AHI 8/hour, lowest desaturation 81%, desaturations noted without respiratory events  Formatting of this note might be different from the original. PSG 05/20/19 AHI 25.1   Osteopenia of multiple sites 06/01/2019   DEXA 05/24/2019: Right femur neck T- 2.2, left femur neck T- 1.5, right total femur T -0.5, left total femur T -0.5, AP total spine T- 1.9. FRAX 10-year probability of major osteoporotic fracture is 8.8% and a hip fracture is 1.2%  DEXA 05/05/2019: Right femur neck T- 1.9, left femur neck T- 2.0, right total femur T -1.3, left total femur   Oxygen deficiency    Postinflammatory pulmonary fibrosis 09/15/2018   ESR    10/161/9  = 102   >  rx short term prednisone ? slt improvement in symptoms HRCT 09/15/2018  1. Very mild basilar subpleural ground-glass, reticulation and traction bronchiolectasis, findings which may be minimally progressive from 06/29/2017 and are indicative of interstitial lung disease such as nonspecific interstitial pneumonitis. Findings are indeterminate for UIP per consensus guidelin   Psychophysiological insomnia 03/29/2019   Type 2 diabetes mellitus 02/09/2017   UTI (urinary tract infection)    Vertigo 03/29/2014   Vitamin D deficiency 09/15/2020   Past Surgical History:  Procedure Laterality Date   ANKLE FRACTURE SURGERY Right    benign tumor      removed from groin   COLONOSCOPY WITH PROPOFOL N/A 05/15/2022   Procedure: COLONOSCOPY WITH PROPOFOL;  Surgeon: Napoleon Form, MD;  Location: [facility];  Service: Gastroenterology;  Laterality: N/A;   HOT HEMOSTASIS N/A 05/15/2022   Procedure: HOT HEMOSTASIS (ARGON PLASMA COAGULATION/BICAP);  Surgeon: Napoleon Form, MD;  Location: [facility];  Service: Gastroenterology;  Laterality: N/A;   POLYPECTOMY  05/15/2022   Procedure: POLYPECTOMY;  Surgeon: Napoleon Form, MD;  Location: [facility];  Service: Gastroenterology;;   WRIST ARTHROCENTESIS N/A 2001   Patient Active Problem List   Diagnosis Date Noted   Morbid obesity 06/16/2022   Physical deconditioning 06/16/2022   AVM (arteriovenous malformation) of colon, acquired with hemorrhage    Positive colorectal cancer screening using Cologuard test    Polyp of sigmoid colon    Polyp of transverse colon    Polyp of cecum    COVID-19 virus infection 11/21/2021   Vitamin D deficiency 09/15/2020   Major depressive disorder 07/17/2020   Chronic urticaria 12/21/2019   Osteopenia of multiple sites 06/01/2019   Psychophysiological insomnia 03/29/2019   Centrilobular emphysema 02/04/2019   Chronic respiratory failure with hypoxia 02/04/2019   Chronic diastolic CHF (congestive heart failure) 01/28/2019   Essential hypertension 01/26/2019   Postinflammatory pulmonary fibrosis 09/15/2018   COPD (chronic obstructive pulmonary disease) 09/01/2018   DOE (dyspnea on exertion) 08/31/2018   Bilateral leg edema 03/31/2018   Generalized anxiety disorder with panic attacks 01/20/2018   Type 2 diabetes mellitus 02/09/2017   Back pain 10/18/2015   OSA (obstructive sleep apnea) 07/26/2015   Vertigo 03/29/2014   Dyshidrotic eczema 12/13/2010   Acquired hypothyroidism 10/01/2010   Mixed hyperlipidemia 07/20/2009   Allergic rhinitis 07/20/2009   Irritable bowel syndrome 07/13/2009   GERD (gastroesophageal reflux disease) 11/03/2007   Headache 11/03/2007    PCP: Gershon Crane MD   REFERRING PROVIDER: Gershon Crane MD   REFERRING DIAG:  Diagnosis  J96.11  (ICD-10-CM) - Chronic respiratory failure with hypoxia (HCC)    THERAPY DIAG:  Other abnormalities of gait and mobility  Rationale for Evaluation and Treatment: Rehabilitation  ONSET DATE: has been on O2 for 3 years   SUBJECTIVE:   SUBJECTIVE STATEMENT: Pt reports increased difficulty maintaining higher O2 levels and has had to increase to 3L of O2 more frequently.   PERTINENT HISTORY: Knees, Loweback, and wrist arthritis. CHF taking lasix, post inflamitory pulmonary fibrosis; vertigo ( still has syncope at times)  PAIN:  Are you having pain? Yes: NPRS scale: 4/10 Pain location: Left knee Pain description: Aching Aggravating factors: Standing walking Relieving factors: Sitting  PRECAUTIONS: falls   WEIGHT BEARING RESTRICTIONS: No  FALLS:  Has patient fallen in last 6 months? No but has ad some near falls   LIVING ENVIRONMENT: Ramp into the house   OCCUPATION: retired  Recreation: would like to be able to get back to gardening  PLOF: Independent with household mobility with device  PATIENT GOALS: To increase endurance  NEXT MD VISIT: Nothing scheduled  OBJECTIVE:   DIAGNOSTIC FINDINGS: Nothing of the knee  PATIENT SURVEYS:  FOTO deferred 2nd to time   COGNITION: Overall cognitive status: Within functional limits for tasks assessed     SENSATION: Has had tinglining in her hands. Has been using wrist guards  EDEMA:   MUSCLE LENGTH:  POSTURE: rounded shoulders, forward head, and flexed trunk   PALPATION: No unexpected tenderness palpation  LOWER EXTREMITY ROM:  Passive ROM Right eval Left eval  Hip flexion    Hip extension    Hip abduction    Hip adduction    Hip internal rotation    Hip external rotation    Knee flexion  Painful at end range  Knee extension    Ankle dorsiflexion    Ankle plantarflexion    Ankle inversion    Ankle eversion     (Blank rows = not tested)  LOWER EXTREMITY MMT:  MMT Right eval Left eval Right 4/8 Left  4//8  Hip flexion 19.7 17.0 33.4 29  Hip extension      Hip abduction 36.9 40.3 35 40.9  Hip adduction      Hip internal rotation      Hip external rotation      Knee flexion      Knee extension 20 18.7 45 35  Ankle dorsiflexion      Ankle plantarflexion      Ankle inversion      Ankle eversion       (Blank rows = not tested)  LOWER EXTREMITY SPECIAL TESTS:   IE: 5x sit to stand: 24 sec   Hr 85  Sao2 96   01/26/23: walk test: 262ft total- seated rest break at 150ft and paused timer.  GAIT: Ambulated 168ft x2 at beginning of session. Seated rest break between. SpO2 was 90% afterwards. Rose to 97%  Ambulated 161ft x2 at end of session. O2 remained at 97-98% this time.  TODAY'S TREATMENT:                                                                                                                               DATE:   4/12 Nu-step L4 x35mins 150 ft, rest break then another 100 ft of walking Biceps curls 3x10 2lbs Chest press 3x10 2lbs Overhead shoulder press 3x10 2lbs  Yellow band horizontal abduction 3x10 Heel raise 3x10 March x10ea  4/9 Nu-step L5 x62mins 150 ft, rest break then another 100 ft of walking Biceps curls 3x10 2lbs Chest press 3x10 2lbs Overhead shoulder press 3x10 2lbs  Yellow band flexion 3x10 Yellow band horizontal abduction 3x10 Yellow band ER 3x10 Heel raise 3x10   Performed progress note of patient, review goals and plan of care   PATIENT EDUCATION:  Education details: HEP, symptom management, activity progression Person educated: Patient Education method: Explanation, Demonstration, Tactile cues, and  Verbal cues Education comprehension: verbalized understanding, returned demonstration, verbal cues required, tactile cues required, and needs further education  HOME EXERCISE PROGRAM: Access Code: [email or web address] URL: [email or web address] Date: 01/19/2023 Prepared by: Riki Altes  Exercises - Heel Raises with Counter  Support  - 1-2 x daily - 7 x weekly - 2 sets - 10 reps - Standing March with Counter Support  - 1-2 x daily - 7 x weekly - 2 sets - 10 reps - Standing Hip Abduction with Counter Support  - 1-2 x daily - 7 x weekly - 2 sets - 10 reps - Standing Hip Extension with Counter Support  - 1-2 x daily - 7 x weekly - 2 sets - 10 reps - Mini Squat with Counter Support  - 1-2 x daily - 7 x weekly - 2 sets - 10 reps - Standing Knee Flexion with Counter Support  - 1-2 x daily - 7 x weekly - 2 sets - 10 reps  ASSESSMENT:  CLINICAL IMPRESSION: Pt's O2 % dropped frequently today with both therex and ambulation on 3L of O2. After 1-2 minutes of rest, O2 would raise up to 97%. She demonstrates decline in maintenance of O2 levels since starting PT. She used to be able to [facility] higher O2% with 3L than she current can.  Instructed pt to again discuss this with MD, especially since she disclosed recent incidence of light headedness while at home on occasion. Pt reported feeling mild light headedness at end of session. BP measured at end of session at 157/75. Instructed pt to monitor her BP at home and notify MD if consistently high. This feeling subsided after a few minutes of rest.    OBJECTIVE IMPAIRMENTS: Abnormal gait, cardiopulmonary status limiting activity, decreased activity tolerance, decreased balance, decreased endurance, decreased mobility, difficulty walking, decreased ROM, decreased strength, and pain.   ACTIVITY LIMITATIONS: carrying, lifting, bending, standing, squatting, stairs, transfers, bathing, dressing, and locomotion level  PARTICIPATION LIMITATIONS: meal prep, cleaning, laundry, driving, shopping, community activity, and yard work  PERSONAL FACTORS: 3+ comorbidities: Knees, Loweback, and wrist arthritis. CHF taking lasix, post inflamitory pulmonary fibrosis; vertigo ( still has syncope at times) are also affecting patient's functional outcome.   REHAB POTENTIAL: Good  CLINICAL DECISION  MAKING: Evolving/moderate complexity declining overall mobility   EVALUATION COMPLEXITY: Moderate   GOALS: Goals reviewed with patient? Yes  SHORT TERM GOALS: Target date: 02/13/2023   Therapy will perform 6-minute walk test with patient and come up with baseline goals Baseline: Goal status: MET 01/26/23  2.  Patient will decrease sit to stand time time by 5 seconds Baseline:  Goal status: not tested 4/8  3.  Patient will perform 10 minutes of interval training on NuStep while maintaining a heart rate less than 110 Baseline:  Goal status: Met 4/8  LONG TERM GOALS: Target date: 03/13/2023    Patient will have a complete exercise program that she can take to [employer] sneakers and continue long-term Baseline:  Goal status: progressing towards program  2.  Patient will ambulate 600 feet without self-report of dyspnea in order to attend appointments without being short of breath Baseline:  Goal status:  not achieved, in progress 4/9  3.  Patient will stand for greater than 20 minutes without increased pain in left knee and increased dyspnea in order to perform ADLs Baseline:  Goal status: In progress 4/9    PLAN:  PT FREQUENCY: 2x/week  PT DURATION: 8 weeks  PLANNED INTERVENTIONS: Therapeutic exercises, Therapeutic activity, Neuromuscular re-education, Balance training,  Gait training, Patient/Family education, Self Care, Joint mobilization, Stair training, DME instructions, Aquatic Therapy, Dry Needling, Electrical stimulation, Spinal mobilization, Cryotherapy, Moist heat, Ultrasound, and Manual therapy  PLAN FOR NEXT SESSION: retest sit to stand, continue to progress patient HEP. Consider standing exercise, mini squats and standing marches. Progress to stair training and airex pad for balance.     Donnel Saxon Terrance Usery, PTA 03/13/2023, 3:34 PM

## 2023-03-16 ENCOUNTER — Ambulatory Visit (HOSPITAL_BASED_OUTPATIENT_CLINIC_OR_DEPARTMENT_OTHER): Payer: PPO

## 2023-03-16 ENCOUNTER — Encounter (HOSPITAL_BASED_OUTPATIENT_CLINIC_OR_DEPARTMENT_OTHER): Payer: Self-pay

## 2023-03-16 DIAGNOSIS — R2689 Other abnormalities of gait and mobility: Secondary | ICD-10-CM | POA: Diagnosis not present

## 2023-03-16 DIAGNOSIS — R5381 Other malaise: Secondary | ICD-10-CM

## 2023-03-16 NOTE — Therapy (Signed)
OUTPATIENT PHYSICAL THERAPY LOWER EXTREMITY treatment    Patient Name: Bonnie Gonzalez MRN: [id number] DOB:[birth year], 67 y.o., female Today's Date: 03/16/2023  END OF SESSION:  PT End of Session - 03/16/23 1308     Visit Number 12    Number of Visits 22    Date for PT Re-Evaluation 04/20/23    Authorization Type progress note done on visit 10    PT Start Time 1307    PT Stop Time 1345    PT Time Calculation (min) 38 min    Activity Tolerance Patient tolerated treatment well    Behavior During Therapy [facility] for tasks assessed/performed             Progress Note Reporting Period 01/15/2023 to 03/09/2023  See note below for Objective Data and Assessment of Progress/Goals.        Past Medical History:  Diagnosis Date   Acquired hypothyroidism 10/01/2010   Allergic rhinitis 07/20/2009   Arthritis    Back pain 10/18/2015   Bilateral leg edema 03/31/2018   Centrilobular emphysema 02/04/2019   HRCT 09/15/18   Chronic diastolic CHF (congestive heart failure) 01/28/2019   Chronic respiratory failure with hypoxia 02/04/2019   6 Min Walk 08/31/18 at [facility]   Chronic urticaria 12/21/2019   COPD (chronic obstructive pulmonary disease) 09/01/2018   Quit smoking 2010  - Spirometry 08/31/2018  FEV1 1.1 (51%)  Ratio 66 s prior rx with typical curvature  - 08/31/2018  After extensive coaching inhaler device,  effectiveness =    90% with elipta > try anoro sample > no better so d/c'  - PFT's  11/10/2018  FEV1 1.24 (59 % ) ratio 75 (ratio with fev1/VC =  63)  p no % improvement from saba p nothing prior to study with DLCO  105  % corrects to 132  %    DOE (dyspnea on exertion) 08/31/2018   Onset 05/2018 with background of unexplained leg swelling x 01/2018   -echo 04/13/18 just G1   diastolic dysfunction s PH   -  08/31/2018   Walked RA x one lap @ 185 stopped due to  Sob/ sats 89% at nl pace  - Spirometry 08/31/2018  FEV1 1.1 (51%)  Ratio 66 s prior rx  - 09/28/2018 could not do full  pfts 09/28/2018  - Collagen vasc profile 09/28/18  For ESR =  54 (was 102 before prednisone)  Neg collag   Dyshidrotic eczema 12/13/2010   Essential hypertension 01/26/2019   Generalized anxiety disorder with panic attacks 01/20/2018   GERD (gastroesophageal reflux disease) 11/03/2007   Headache 11/03/2007   Irritable bowel syndrome 07/13/2009   Major depressive disorder 07/17/2020   Mixed hyperlipidemia 07/20/2009   Obesity    OSA (obstructive sleep apnea) 07/26/2015   HST 08/2015 mild OSA, AHI 8/hour, lowest desaturation 81%, desaturations noted without respiratory events  Formatting of this note might be different from the original. PSG 05/20/19 AHI 25.1   Osteopenia of multiple sites 06/01/2019   DEXA 05/24/2019: Right femur neck T- 2.2, left femur neck T- 1.5, right total femur T -0.5, left total femur T -0.5, AP total spine T- 1.9. FRAX 10-year probability of major osteoporotic fracture is 8.8% and a hip fracture is 1.2%  DEXA 05/05/2019: Right femur neck T- 1.9, left femur neck T- 2.0, right total femur T -1.3, left total femur   Oxygen deficiency    Postinflammatory pulmonary fibrosis 09/15/2018   ESR    10/161/9  = 102   >  rx short term prednisone ? slt improvement in symptoms HRCT 09/15/2018  1. Very mild basilar subpleural ground-glass, reticulation and traction bronchiolectasis, findings which may be minimally progressive from 06/29/2017 and are indicative of interstitial lung disease such as nonspecific interstitial pneumonitis. Findings are indeterminate for UIP per consensus guidelin   Psychophysiological insomnia 03/29/2019   Type 2 diabetes mellitus 02/09/2017   UTI (urinary tract infection)    Vertigo 03/29/2014   Vitamin D deficiency 09/15/2020   Past Surgical History:  Procedure Laterality Date   ANKLE FRACTURE SURGERY Right    benign tumor      removed from groin   COLONOSCOPY WITH PROPOFOL N/A 05/15/2022   Procedure: COLONOSCOPY WITH PROPOFOL;  Surgeon: Napoleon Form, MD;  Location: [facility];  Service: Gastroenterology;  Laterality: N/A;   HOT HEMOSTASIS N/A 05/15/2022   Procedure: HOT HEMOSTASIS (ARGON PLASMA COAGULATION/BICAP);  Surgeon: Napoleon Form, MD;  Location: [facility];  Service: Gastroenterology;  Laterality: N/A;   POLYPECTOMY  05/15/2022   Procedure: POLYPECTOMY;  Surgeon: Napoleon Form, MD;  Location: [facility];  Service: Gastroenterology;;   WRIST ARTHROCENTESIS N/A 2001   Patient Active Problem List   Diagnosis Date Noted   Morbid obesity 06/16/2022   Physical deconditioning 06/16/2022   AVM (arteriovenous malformation) of colon, acquired with hemorrhage    Positive colorectal cancer screening using Cologuard test    Polyp of sigmoid colon    Polyp of transverse colon    Polyp of cecum    COVID-19 virus infection 11/21/2021   Vitamin D deficiency 09/15/2020   Major depressive disorder 07/17/2020   Chronic urticaria 12/21/2019   Osteopenia of multiple sites 06/01/2019   Psychophysiological insomnia 03/29/2019   Centrilobular emphysema 02/04/2019   Chronic respiratory failure with hypoxia 02/04/2019   Chronic diastolic CHF (congestive heart failure) 01/28/2019   Essential hypertension 01/26/2019   Postinflammatory pulmonary fibrosis 09/15/2018   COPD (chronic obstructive pulmonary disease) 09/01/2018   DOE (dyspnea on exertion) 08/31/2018   Bilateral leg edema 03/31/2018   Generalized anxiety disorder with panic attacks 01/20/2018   Type 2 diabetes mellitus 02/09/2017   Back pain 10/18/2015   OSA (obstructive sleep apnea) 07/26/2015   Vertigo 03/29/2014   Dyshidrotic eczema 12/13/2010   Acquired hypothyroidism 10/01/2010   Mixed hyperlipidemia 07/20/2009   Allergic rhinitis 07/20/2009   Irritable bowel syndrome 07/13/2009   GERD (gastroesophageal reflux disease) 11/03/2007   Headache 11/03/2007    PCP: Gershon Crane MD   REFERRING PROVIDER: Gershon Crane MD   REFERRING DIAG:  Diagnosis  J96.11  (ICD-10-CM) - Chronic respiratory failure with hypoxia (HCC)    THERAPY DIAG:  Other abnormalities of gait and mobility  Physical deconditioning  Rationale for Evaluation and Treatment: Rehabilitation  ONSET DATE: has been on O2 for 3 years   SUBJECTIVE:   SUBJECTIVE STATEMENT: Pt reports she called MD regarding her O2 levels and left a message on Friday. "I still haven't heard from them." Pt reports she has had to bump up to 3L now each time she goes out of the house.   PERTINENT HISTORY: Knees, Loweback, and wrist arthritis. CHF taking lasix, post inflamitory pulmonary fibrosis; vertigo ( still has syncope at times)  PAIN:  Are you having pain? Yes: NPRS scale: 4/10 Pain location: Left knee Pain description: Aching Aggravating factors: Standing walking Relieving factors: Sitting  PRECAUTIONS: falls   WEIGHT BEARING RESTRICTIONS: No  FALLS:  Has patient fallen in last 6 months? No but has ad some near falls  LIVING ENVIRONMENT: Ramp into the house   OCCUPATION: retired  Recreation: would like to be able to get back to gardening  PLOF: Independent with household mobility with device  PATIENT GOALS: To increase endurance  NEXT MD VISIT: Nothing scheduled  OBJECTIVE:   DIAGNOSTIC FINDINGS: Nothing of the knee   COGNITION: Overall cognitive status: Within functional limits for tasks assessed     SENSATION: Has had tinglining in her hands. Has been using wrist guards   MUSCLE LENGTH:  POSTURE: rounded shoulders, forward head, and flexed trunk    LOWER EXTREMITY ROM:  Passive ROM Right eval Left eval  Hip flexion    Hip extension    Hip abduction    Hip adduction    Hip internal rotation    Hip external rotation    Knee flexion  Painful at end range  Knee extension    Ankle dorsiflexion    Ankle plantarflexion    Ankle inversion    Ankle eversion     (Blank rows = not tested)  LOWER EXTREMITY MMT:  MMT Right eval Left eval Right 4/8  Left 4//8  Hip flexion 19.7 17.0 33.4 29  Hip extension      Hip abduction 36.9 40.3 35 40.9  Hip adduction      Hip internal rotation      Hip external rotation      Knee flexion      Knee extension 20 18.7 45 35  Ankle dorsiflexion      Ankle plantarflexion      Ankle inversion      Ankle eversion       (Blank rows = not tested)  LOWER EXTREMITY SPECIAL TESTS:   IE: 5x sit to stand: 24 sec   Hr 85  Sao2 96   01/26/23: walk test: 273ft total- seated rest break at 151ft and paused timer. 4/15: 5xSTS-21sec  GAIT:  TODAY'S TREATMENT:                                                                                                                               DATE:   4/15 Nu-step L4 x39mins 2X150 ft (97% at 3L), 100 ft (99%). Did drop to 89% when drinking water after first round of gait training.  Biceps curls 3x10 2lbs Chest press 3x10 2lbs  Vitals    4/12 Nu-step L4 x52mins 150 ft, rest break then another 100 ft of walking Biceps curls 3x10 2lbs Chest press 3x10 2lbs Overhead shoulder press 3x10 2lbs  Yellow band horizontal abduction 3x10 Heel raise 3x10 March x10ea  4/9 Nu-step L5 x37mins 150 ft, rest break then another 100 ft of walking Biceps curls 3x10 2lbs Chest press 3x10 2lbs Overhead shoulder press 3x10 2lbs  Yellow band flexion 3x10 Yellow band horizontal abduction 3x10 Yellow band ER 3x10 Heel raise 3x10   Performed progress note of patient, review goals and plan of care   PATIENT EDUCATION:  Education  details: HEP, symptom management, activity progression Person educated: Patient Education method: Explanation, Demonstration, Tactile cues, and Verbal cues Education comprehension: verbalized understanding, returned demonstration, verbal cues required, tactile cues required, and needs further education  HOME EXERCISE PROGRAM: Access Code: [email or web address] URL: [email or web address] Date: 01/19/2023 Prepared by: Riki Altes  Exercises - Heel Raises with Counter Support  - 1-2 x daily - 7 x weekly - 2 sets - 10 reps - Standing March with Counter Support  - 1-2 x daily - 7 x weekly - 2 sets - 10 reps - Standing Hip Abduction with Counter Support  - 1-2 x daily - 7 x weekly - 2 sets - 10 reps - Standing Hip Extension with Counter Support  - 1-2 x daily - 7 x weekly - 2 sets - 10 reps - Mini Squat with Counter Support  - 1-2 x daily - 7 x weekly - 2 sets - 10 reps - Standing Knee Flexion with Counter Support  - 1-2 x daily - 7 x weekly - 2 sets - 10 reps  ASSESSMENT:  CLINICAL IMPRESSION: Pt did report episode of mild light headedness during a seated rest break in clinic. She has been having these since late last week. Opted to take BP again. Two trials:161/64mmHg and 165/93mmHg. Advised pt call MD office again since they have not reached back out yet. Will continue to monitor her sx of lightheadedness and dyspnea in clinic.   OBJECTIVE IMPAIRMENTS: Abnormal gait, cardiopulmonary status limiting activity, decreased activity tolerance, decreased balance, decreased endurance, decreased mobility, difficulty walking, decreased ROM, decreased strength, and pain.   ACTIVITY LIMITATIONS: carrying, lifting, bending, standing, squatting, stairs, transfers, bathing, dressing, and locomotion level  PARTICIPATION LIMITATIONS: meal prep, cleaning, laundry, driving, shopping, community activity, and yard work  PERSONAL FACTORS: 3+ comorbidities: Knees, Loweback, and wrist arthritis. CHF taking lasix, post inflamitory pulmonary fibrosis; vertigo ( still has syncope at times) are also affecting patient's functional outcome.   REHAB POTENTIAL: Good  CLINICAL DECISION MAKING: Evolving/moderate complexity declining overall mobility   EVALUATION COMPLEXITY: Moderate   GOALS: Goals reviewed with patient? Yes  SHORT TERM GOALS: Target date: 02/13/2023   Therapy will perform 6-minute walk test with patient and come up  with baseline goals Baseline: Goal status: MET 01/26/23  2.  Patient will decrease sit to stand time time by 5 seconds Baseline:  Goal status: In Progress (4/15)  3.  Patient will perform 10 minutes of interval training on NuStep while maintaining a heart rate less than 110 Baseline:  Goal status: Met 4/8  LONG TERM GOALS: Target date: 03/13/2023    Patient will have a complete exercise program that she can take to Silver sneakers and continue long-term Baseline:  Goal status: progressing towards program  2.  Patient will ambulate 600 feet without self-report of dyspnea in order to attend appointments without being short of breath Baseline:  Goal status:  not achieved, in progress 4/9  3.  Patient will stand for greater than 20 minutes without increased pain in left knee and increased dyspnea in order to perform ADLs Baseline:  Goal status: In progress 4/9    PLAN:  PT FREQUENCY: 2x/week  PT DURATION: 8 weeks  PLANNED INTERVENTIONS: Therapeutic exercises, Therapeutic activity, Neuromuscular re-education, Balance training, Gait training, Patient/Family education, Self Care, Joint mobilization, Stair training, DME instructions, Aquatic Therapy, Dry Needling, Electrical stimulation, Spinal mobilization, Cryotherapy, Moist heat, Ultrasound, and Manual therapy  PLAN FOR NEXT SESSION: retest sit to stand, continue to progress patient HEP. Consider  standing exercise, mini squats and standing marches. Progress to stair training and airex pad for balance.     Donnel Saxon Deya Bigos, PTA 03/16/2023, 2:54 PM

## 2023-03-17 NOTE — Progress Notes (Signed)
Virtual Visit Via Video       Consent was obtained for video visit:  Yes.   Answered questions that patient had about telehealth interaction:  Yes.   I discussed the limitations, risks, security and privacy concerns of performing an evaluation and management service by telemedicine. I also discussed with the patient that there may be a patient responsible charge related to this service. The patient expressed understanding and agreed to proceed.  Pt location: Home Physician Location: office Name of referring provider:  Nelwyn Salisbury, MD I connected with Bonnie Gonzalez at patients initiation/request on 03/19/2023 at 10:45 AM EDT by video enabled telemedicine application and verified that I am speaking with the correct person using two identifiers. Pt MRN:  [id number] Pt DOB:  1956/09/03 Video Participants:  Tasia Deatra James;    Assessment/Plan:    1.  Tremor  -As previous, the medications for her lungs are likely exacerbating this, even though she certainly may have a degree of essential tremor as well.  -Patient reports primidone markedly helped.  We will continue on primidone, 50 mg daily.  Refilled today   2.  Memory change  -Neurocognitive testing in August, 2022 was normal.  No evidence of a neurodegenerative process.  She stopped the donepezil that she had been on for several years.  She thought potentially memory has declined since then, but she is also caregiving for her older brother who has moved in with her and has been diagnosed with dementia, so I suspect stress is playing a role.  3.  Follow-up 1 year  Subjective:   Bonnie Gonzalez was seen today in follow up for tremor.  My previous records were reviewed prior to todays visit.  She has been on primidone, 50 mg daily.  She has tolerated the medication well, without side effects.  "Its helping a great deal."  She has been following with her other specialists, last seen her pulmonologist April 2.  She saw her  endocrinologist that same day.  They just restarted her Mounjaro.    PREVIOUS MEDICATIONS: none to date  ALLERGIES:   Allergies  Allergen Reactions   Aspirin     Mother had to be resuscitated after ingesting this   Hydrocodone-Acetaminophen Hives   Oxycodone-Acetaminophen Hives   Penicillins Hives    Did it involve swelling of the face/tongue/throat, SOB, or low BP? Yes-hives Did it involve sudden or severe rash/hives, skin peeling, or any reaction on the inside of your mouth or nose? Unk Did you need to seek medical attention at a hospital or doctor's office? Yes When did it last happen? Was in her 30's If all above answers are "NO", may proceed with cephalosporin use.  Did it involve swelling of the face/tongue/throat, SOB, or low BP? Yes-hives Did it involve sudden or severe rash/hives, skin peeling, or any reaction on the inside of your mouth or nose? Unk Did you need to seek medical attention at a hospital or doctor's office? Yes When did it last happen? Was in her 30's If all above answers are "NO", may proceed with cephalosporin use.   Codeine Hives   Fluoxetine Hcl Itching and Other (See Comments)    "Hair itching"   Trazodone Other (See Comments)    Couldn't walk    CURRENT MEDICATIONS:  Outpatient Encounter Medications as of 03/19/2023  Medication Sig   linaclotide (LINZESS) 290 MCG CAPS capsule Take 1 capsule (290 mcg total) by mouth daily before breakfast.   albuterol (PROVENTIL HFA;VENTOLIN  HFA) 108 (90 Base) MCG/ACT inhaler Inhale 2 puffs into the lungs every 4 (four) hours as needed for wheezing or shortness of breath.   ALPRAZolam (XANAX) 0.5 MG tablet TAKE 1 TABLET (0.5 MG TOTAL) BY MOUTH 2 (TWO) TIMES DAILY AS NEEDED. (Patient taking differently: Take 0.25-0.5 mg by mouth 2 (two) times daily as needed for anxiety or sleep.)   atorvastatin (LIPITOR) 80 MG tablet Take 1 tablet by mouth once daily   azelastine (ASTELIN) 0.1 % nasal spray Place 1 spray into both  nostrils 2 (two) times daily as needed for allergies.   benzonatate (TESSALON) 200 MG capsule TAKE 1 CAPSULE BY MOUTH EVERY 8 HOURS AS NEEDED FOR COUGH   buPROPion ER (WELLBUTRIN SR) 100 MG 12 hr tablet Take 100 mg by mouth daily.   cetirizine (ZYRTEC) 10 MG tablet Take 10 mg by mouth daily.   Cholecalciferol 125 MCG (5000 UT) TABS Take 5,000 Units by mouth in the morning.   Dextromethorphan-guaiFENesin (MUCINEX COUGH & CHEST CONGEST PO) Take 1 capsule by mouth as needed (Patient taking as needed).   DULoxetine (CYMBALTA) 60 MG capsule Take 1 capsule (60 mg total) by mouth daily. (Patient taking differently: Take 120 mg by mouth at bedtime. Take two capsules daily)   esomeprazole (NEXIUM) 20 MG capsule TAKE 1 CAPSULE BY MOUTH ONCE DAILY BEFORE BREAKFAST   Fluticasone-Umeclidin-Vilant (TRELEGY ELLIPTA) 100-62.5-25 MCG/INH AEPB Inhale 1 puff into the lungs daily.   furosemide (LASIX) 40 MG tablet Take 1 tablet by mouth once daily   glucose 4 GM chewable tablet Chew 1 tablet (4 g total) by mouth as needed for low blood sugar (< 70).   hydrOXYzine (VISTARIL) 25 MG capsule Take 1 capsule by mouth three times daily as needed   Hyprom-Naphaz-Polysorb-Zn Sulf (CLEAR EYES COMPLETE) SOLN Place 1-2 drops into both eyes as needed (for itching).   insulin aspart (NOVOLOG) 100 UNIT/ML injection Use sliding scale provided at time of discharge, administer 3 times daily with meals (Patient taking differently: Inject 15-48 Units into the skin See admin instructions. Inject 20-40 units subcutaneously 3 times daily with meals and inject 15-35 units subcutaneously at night if needed with nighttime snacking)   insulin glargine, 2 Unit Dial, (TOUJEO MAX SOLOSTAR) 300 UNIT/ML Solostar Pen Inject 80 Units into the skin 2 (two) times daily.   Insulin Pen Needle (B-D UF III MINI PEN NEEDLES) 31G X 5 MM MISC Use to administer insulin five times a day   levothyroxine (SYNTHROID) 175 MCG tablet TAKE 1 TABLET BY MOUTH ONCE DAILY  BEFORE BREAKFAST. *Labs required for future refills*   methocarbamol (ROBAXIN) 500 MG tablet Take 1 tablet (500 mg total) by mouth every 6 (six) hours as needed for muscle spasms.   naproxen (NAPROSYN) 500 MG tablet Take 1 tablet by mouth twice daily   Nerve Stimulator (CLEVER CHOICE TENS UNIT) DEVI Place 1 Dose onto the skin as needed (pain.).   nystatin (MYCOSTATIN/NYSTOP) powder Apply 1 Application topically 2 (two) times daily as needed (rash).   OXYGEN Inhale 2 L into the lungs continuous.   primidone (MYSOLINE) 50 MG tablet Take 1 tablet (50 mg total) by mouth at bedtime.   Probiotic Product (MISC INTESTINAL FLORA REGULAT) CAPS Take 1 capsule by mouth daily.   temazepam (RESTORIL) 15 MG capsule Take 15 mg by mouth at bedtime.   terbinafine (LAMISIL) 250 MG tablet Take 1 tablet (250 mg total) by mouth daily.   tirzepatide (MOUNJARO) 7.5 MG/0.5ML Pen Inject 7.5 mg into the skin  every Sunday.   [DISCONTINUED] primidone (MYSOLINE) 50 MG tablet Take 1 tablet (50 mg total) by mouth at bedtime.   No facility-administered encounter medications on file as of 03/19/2023.     Objective:    PHYSICAL EXAMINATION:    VITALS:   There were no vitals filed for this visit.    Orientation:  The patient is alert and oriented x 3.   Cranial nerves: There is good facial symmetry.  Speech is fluent and clear. Hearing is intact to conversational tone. Motor: Strength is at least antigravity in the upper extremities.  I was not able to see the lower extremities Gait and Station: Patient reports not ambulating much because of shortness of breath so deferred today    MOVEMENT EXAM: Tremor:  There is no rest tremor.  No postural tremor.  No intention tremor today. I have reviewed and interpreted the following labs independently   Chemistry      Component Value Date/Time   NA 141 05/28/2022 1448   K 4.1 05/28/2022 1448   CL 94 (L) 05/28/2022 1448   CO2 30 (H) 05/28/2022 1448   BUN 13 05/28/2022  1448   CREATININE 0.64 05/28/2022 1448      Component Value Date/Time   CALCIUM 9.6 05/28/2022 1448   ALKPHOS 92 03/04/2022 1003   AST 25 03/04/2022 1003   ALT 29 03/04/2022 1003   BILITOT 0.4 03/04/2022 1003      Lab Results  Component Value Date   WBC 9.9 03/04/2022   HGB 12.0 03/04/2022   HCT 38.1 03/04/2022   MCV 85.2 03/04/2022   PLT 196.0 03/04/2022   Lab Results  Component Value Date   TSH 2.87 03/04/2022     Chemistry      Component Value Date/Time   NA 141 05/28/2022 1448   K 4.1 05/28/2022 1448   CL 94 (L) 05/28/2022 1448   CO2 30 (H) 05/28/2022 1448   BUN 13 05/28/2022 1448   CREATININE 0.64 05/28/2022 1448      Component Value Date/Time   CALCIUM 9.6 05/28/2022 1448   ALKPHOS 92 03/04/2022 1003   AST 25 03/04/2022 1003   ALT 29 03/04/2022 1003   BILITOT 0.4 03/04/2022 1003      Follow up Instructions      -I discussed the assessment and treatment plan with the patient. The patient was provided an opportunity to ask questions and all were answered. The patient agreed with the plan and demonstrated an understanding of the instructions.   The patient was advised to call back or seek an in-person evaluation if the symptoms worsen or if the condition fails to improve as anticipated.   Kerin Salen, DO     Cc:  Nelwyn Salisbury, MD

## 2023-03-19 ENCOUNTER — Telehealth: Payer: PPO | Admitting: Neurology

## 2023-03-19 DIAGNOSIS — G250 Essential tremor: Secondary | ICD-10-CM

## 2023-03-19 DIAGNOSIS — R413 Other amnesia: Secondary | ICD-10-CM

## 2023-03-19 MED ORDER — PRIMIDONE 50 MG PO TABS: 50.0000 mg | ORAL_TABLET | Freq: Every day | ORAL | 3 refills | Status: DC

## 2023-03-20 ENCOUNTER — Encounter (HOSPITAL_BASED_OUTPATIENT_CLINIC_OR_DEPARTMENT_OTHER): Payer: Self-pay | Admitting: Physical Therapy

## 2023-03-20 ENCOUNTER — Ambulatory Visit (HOSPITAL_BASED_OUTPATIENT_CLINIC_OR_DEPARTMENT_OTHER): Payer: PPO | Admitting: Physical Therapy

## 2023-03-20 DIAGNOSIS — R5381 Other malaise: Secondary | ICD-10-CM

## 2023-03-20 DIAGNOSIS — R2689 Other abnormalities of gait and mobility: Secondary | ICD-10-CM

## 2023-03-20 NOTE — Therapy (Signed)
OUTPATIENT PHYSICAL THERAPY LOWER EXTREMITY treatment    Patient Name: Bonnie Gonzalez MRN: [id number] DOB:11-08-1956, 67 y.o., female Today's Date: 03/20/2023  END OF SESSION:  PT End of Session - 03/20/23 1019     Visit Number 13    Number of Visits 22    Date for PT Re-Evaluation 04/20/23    PT Start Time 1015    PT Stop Time 1058    PT Time Calculation (min) 43 min    Activity Tolerance Patient tolerated treatment well    Behavior During Therapy [facility] for tasks assessed/performed             Progress Note Reporting Period 01/15/2023      Past Medical History:  Diagnosis Date   Acquired hypothyroidism 10/01/2010   Allergic rhinitis 07/20/2009   Arthritis    Back pain 10/18/2015   Bilateral leg edema 03/31/2018   Centrilobular emphysema 02/04/2019   HRCT 09/15/18   Chronic diastolic CHF (congestive heart failure) 01/28/2019   Chronic respiratory failure with hypoxia 02/04/2019   6 Min Walk 08/31/18 at [facility]   Chronic urticaria 12/21/2019   COPD (chronic obstructive pulmonary disease) 09/01/2018   Quit smoking 2010  - Spirometry 08/31/2018  FEV1 1.1 (51%)  Ratio 66 s prior rx with typical curvature  - 08/31/2018  After extensive coaching inhaler device,  effectiveness =    90% with elipta > try anoro sample > no better so d/c'  - PFT's  11/10/2018  FEV1 1.24 (59 % ) ratio 75 (ratio with fev1/VC =  63)  p no % improvement from saba p nothing prior to study with DLCO  105  % corrects to 132  %    DOE (dyspnea on exertion) 08/31/2018   Onset 05/2018 with background of unexplained leg swelling x 01/2018   -echo 04/13/18 just G1   diastolic dysfunction s PH   -  08/31/2018   Walked RA x one lap @ 185 stopped due to  Sob/ sats 89% at nl pace  - Spirometry 08/31/2018  FEV1 1.1 (51%)  Ratio 66 s prior rx  - 09/28/2018 could not do full pfts 09/28/2018  - Collagen vasc profile 09/28/18  For ESR =  54 (was 102 before prednisone)  Neg collag   Dyshidrotic eczema 12/13/2010    Essential hypertension 01/26/2019   Generalized anxiety disorder with panic attacks 01/20/2018   GERD (gastroesophageal reflux disease) 11/03/2007   Headache 11/03/2007   Irritable bowel syndrome 07/13/2009   Major depressive disorder 07/17/2020   Mixed hyperlipidemia 07/20/2009   Obesity    OSA (obstructive sleep apnea) 07/26/2015   HST 08/2015 mild OSA, AHI 8/hour, lowest desaturation 81%, desaturations noted without respiratory events  Formatting of this note might be different from the original. PSG 05/20/19 AHI 25.1   Osteopenia of multiple sites 06/01/2019   DEXA 05/24/2019: Right femur neck T- 2.2, left femur neck T- 1.5, right total femur T -0.5, left total femur T -0.5, AP total spine T- 1.9. FRAX 10-year probability of major osteoporotic fracture is 8.8% and a hip fracture is 1.2%  DEXA 05/05/2019: Right femur neck T- 1.9, left femur neck T- 2.0, right total femur T -1.3, left total femur   Oxygen deficiency    Postinflammatory pulmonary fibrosis 09/15/2018   ESR    10/161/9  = 102   > rx short term prednisone ? slt improvement in symptoms HRCT 09/15/2018  1. Very mild basilar subpleural ground-glass, reticulation and traction bronchiolectasis, findings which may  be minimally progressive from 06/29/2017 and are indicative of interstitial lung disease such as nonspecific interstitial pneumonitis. Findings are indeterminate for UIP per consensus guidelin   Psychophysiological insomnia 03/29/2019   Type 2 diabetes mellitus 02/09/2017   UTI (urinary tract infection)    Vertigo 03/29/2014   Vitamin D deficiency 09/15/2020   Past Surgical History:  Procedure Laterality Date   ANKLE FRACTURE SURGERY Right    benign tumor      removed from groin   COLONOSCOPY WITH PROPOFOL N/A 05/15/2022   Procedure: COLONOSCOPY WITH PROPOFOL;  Surgeon: Napoleon Form, MD;  Location: [facility];  Service: Gastroenterology;  Laterality: N/A;   HOT HEMOSTASIS N/A 05/15/2022   Procedure: HOT HEMOSTASIS  (ARGON PLASMA COAGULATION/BICAP);  Surgeon: Napoleon Form, MD;  Location: [facility];  Service: Gastroenterology;  Laterality: N/A;   POLYPECTOMY  05/15/2022   Procedure: POLYPECTOMY;  Surgeon: Napoleon Form, MD;  Location: [facility];  Service: Gastroenterology;;   WRIST ARTHROCENTESIS N/A 2001   Patient Active Problem List   Diagnosis Date Noted   Morbid obesity 06/16/2022   Physical deconditioning 06/16/2022   AVM (arteriovenous malformation) of colon, acquired with hemorrhage    Positive colorectal cancer screening using Cologuard test    Polyp of sigmoid colon    Polyp of transverse colon    Polyp of cecum    COVID-19 virus infection 11/21/2021   Vitamin D deficiency 09/15/2020   Major depressive disorder 07/17/2020   Chronic urticaria 12/21/2019   Osteopenia of multiple sites 06/01/2019   Psychophysiological insomnia 03/29/2019   Centrilobular emphysema 02/04/2019   Chronic respiratory failure with hypoxia 02/04/2019   Chronic diastolic CHF (congestive heart failure) 01/28/2019   Essential hypertension 01/26/2019   Postinflammatory pulmonary fibrosis 09/15/2018   COPD (chronic obstructive pulmonary disease) 09/01/2018   DOE (dyspnea on exertion) 08/31/2018   Bilateral leg edema 03/31/2018   Generalized anxiety disorder with panic attacks 01/20/2018   Type 2 diabetes mellitus 02/09/2017   Back pain 10/18/2015   OSA (obstructive sleep apnea) 07/26/2015   Vertigo 03/29/2014   Dyshidrotic eczema 12/13/2010   Acquired hypothyroidism 10/01/2010   Mixed hyperlipidemia 07/20/2009   Allergic rhinitis 07/20/2009   Irritable bowel syndrome 07/13/2009   GERD (gastroesophageal reflux disease) 11/03/2007   Headache 11/03/2007    PCP: Gershon Crane MD   REFERRING PROVIDER: Gershon Crane MD   REFERRING DIAG:  Diagnosis  J96.11 (ICD-10-CM) - Chronic respiratory failure with hypoxia (HCC)    THERAPY DIAG:  No diagnosis found.  Rationale for Evaluation and  Treatment: Rehabilitation  ONSET DATE: has been on O2 for 3 years   SUBJECTIVE:   SUBJECTIVE STATEMENT: Pt was informed by her doctor that she will stay have to bump her oxygen level to level 3. Before she bumped it up she reports that walking around the house at level 2 it would be at 85. Patient will be having a breathing test coming up along with a chest xray.   PERTINENT HISTORY: Knees, Loweback, and wrist arthritis. CHF taking lasix, post inflamitory pulmonary fibrosis; vertigo ( still has syncope at times)  PAIN:  Are you having pain? Yes: NPRS scale: 4/10 Pain location: Left knee Pain description: Aching Aggravating factors: Standing walking Relieving factors: Sitting  PRECAUTIONS: falls   WEIGHT BEARING RESTRICTIONS: No  FALLS:  Has patient fallen in last 6 months? No but has ad some near falls   LIVING ENVIRONMENT: Ramp into the house   OCCUPATION: retired  Recreation: would like to be able  to get back to gardening  PLOF: Independent with household mobility with device  PATIENT GOALS: To increase endurance  NEXT MD VISIT: Nothing scheduled  OBJECTIVE:   DIAGNOSTIC FINDINGS: Nothing of the knee   COGNITION: Overall cognitive status: Within functional limits for tasks assessed     SENSATION: Has had tinglining in her hands. Has been using wrist guards   MUSCLE LENGTH:  POSTURE: rounded shoulders, forward head, and flexed trunk    LOWER EXTREMITY ROM:  Passive ROM Right eval Left eval  Hip flexion    Hip extension    Hip abduction    Hip adduction    Hip internal rotation    Hip external rotation    Knee flexion  Painful at end range  Knee extension    Ankle dorsiflexion    Ankle plantarflexion    Ankle inversion    Ankle eversion     (Blank rows = not tested)  LOWER EXTREMITY MMT:  MMT Right eval Left eval Right 4/8 Left 4//8  Hip flexion 19.7 17.0 33.4 29  Hip extension      Hip abduction 36.9 40.3 35 40.9  Hip adduction       Hip internal rotation      Hip external rotation      Knee flexion      Knee extension 20 18.7 45 35  Ankle dorsiflexion      Ankle plantarflexion      Ankle inversion      Ankle eversion       (Blank rows = not tested)  LOWER EXTREMITY SPECIAL TESTS:   IE: 5x sit to stand: 24 sec   Hr 85  Sao2 96   01/26/23: walk test: 240ft total- seated rest break at 11ft and paused timer. 4/15: 5xSTS-21sec  GAIT:  TODAY'S TREATMENT:                                                                                                                               DATE:   4/19 NU step x66mins L2 (90% at L3), x83mins L3 (94% at L3) X53mins L4 (96% at L3) Bicep curls 3x10 3lbs Chest Press 3x10 3lbs Overhead shoulder press 3x10 3lbs Hip abduction green band 3x10 Hamstring curls red band 2x10 Seated ER 2x10 red band Standing heel raise 2x10   4/15 Nu-step L4 x1mins 2X150 ft (97% at 3L), 100 ft (99%). Did drop to 89% when drinking water after first round of gait training.  Biceps curls 3x10 2lbs Chest press 3x10 2lbs  Vitals    4/12 Nu-step L4 x4mins 150 ft, rest break then another 100 ft of walking Biceps curls 3x10 2lbs Chest press 3x10 2lbs Overhead shoulder press 3x10 2lbs  Yellow band horizontal abduction 3x10 Heel raise 3x10 March x10ea  4/9 Nu-step L5 x13mins 150 ft, rest break then another 100 ft of walking Biceps curls 3x10 2lbs Chest press 3x10 2lbs Overhead shoulder press 3x10 2lbs  Yellow band flexion 3x10 Yellow band horizontal abduction 3x10 Yellow band ER 3x10 Heel raise 3x10   Performed progress note of patient, review goals and plan of care   PATIENT EDUCATION:  Education details: HEP, symptom management, activity progression Person educated: Patient Education method: Explanation, Demonstration, Tactile cues, and Verbal cues Education comprehension: verbalized understanding, returned demonstration, verbal cues required, tactile cues required, and  needs further education  HOME EXERCISE PROGRAM: Access Code: [email or web address] URL: [email or web address] Date: 01/19/2023 Prepared by: Riki Altes  Exercises - Heel Raises with Counter Support  - 1-2 x daily - 7 x weekly - 2 sets - 10 reps - Standing March with Counter Support  - 1-2 x daily - 7 x weekly - 2 sets - 10 reps - Standing Hip Abduction with Counter Support  - 1-2 x daily - 7 x weekly - 2 sets - 10 reps - Standing Hip Extension with Counter Support  - 1-2 x daily - 7 x weekly - 2 sets - 10 reps - Mini Squat with Counter Support  - 1-2 x daily - 7 x weekly - 2 sets - 10 reps - Standing Knee Flexion with Counter Support  - 1-2 x daily - 7 x weekly - 2 sets - 10 reps  ASSESSMENT:  CLINICAL IMPRESSION: Patient entry O2 level was 88% at entry. After a couple of minutes, O2 was retaken, read 90%. We proceeded with the nu step and did a couple interval, each time increasing the intensity level while monitoring O2 levels. Patient was able to progress to 3lbs for chest press, overhead shoulder press and biceps curls. Patient reports no problem with increase of weight, "it is a challenge". Patient was given hamstring curls with red band and tolerated well. Patient will continue to be challenge and progress as tolerated. Therapy consistently monitored the patients Sao2 levels throughout the treatment.   OBJECTIVE IMPAIRMENTS: Abnormal gait, cardiopulmonary status limiting activity, decreased activity tolerance, decreased balance, decreased endurance, decreased mobility, difficulty walking, decreased ROM, decreased strength, and pain.   ACTIVITY LIMITATIONS: carrying, lifting, bending, standing, squatting, stairs, transfers, bathing, dressing, and locomotion level  PARTICIPATION LIMITATIONS: meal prep, cleaning, laundry, driving, shopping, community activity, and yard work  PERSONAL FACTORS: 3+ comorbidities: Knees, Loweback, and wrist arthritis. CHF taking lasix, post  inflamitory pulmonary fibrosis; vertigo ( still has syncope at times) are also affecting patient's functional outcome.   REHAB POTENTIAL: Good  CLINICAL DECISION MAKING: Evolving/moderate complexity declining overall mobility   EVALUATION COMPLEXITY: Moderate   GOALS: Goals reviewed with patient? Yes  SHORT TERM GOALS: Target date: 02/13/2023   Therapy will perform 6-minute walk test with patient and come up with baseline goals Baseline: Goal status: MET 01/26/23  2.  Patient will decrease sit to stand time time by 5 seconds Baseline:  Goal status: In Progress (4/15)  3.  Patient will perform 10 minutes of interval training on NuStep while maintaining a heart rate less than 110 Baseline:  Goal status: Met 4/8  LONG TERM GOALS: Target date: 03/13/2023    Patient will have a complete exercise program that she can take to Silver sneakers and continue long-term Baseline:  Goal status: progressing towards program  2.  Patient will ambulate 600 feet without self-report of dyspnea in order to attend appointments without being short of breath Baseline:  Goal status:  not achieved, in progress 4/9  3.  Patient will stand for greater than 20 minutes without increased pain in left knee and increased dyspnea in order to  perform ADLs Baseline:  Goal status: In progress 4/9    PLAN:  PT FREQUENCY: 2x/week  PT DURATION: 8 weeks  PLANNED INTERVENTIONS: Therapeutic exercises, Therapeutic activity, Neuromuscular re-education, Balance training, Gait training, Patient/Family education, Self Care, Joint mobilization, Stair training, DME instructions, Aquatic Therapy, Dry Needling, Electrical stimulation, Spinal mobilization, Cryotherapy, Moist heat, Ultrasound, and Manual therapy  PLAN FOR NEXT SESSION: Continue to progress patient HEP. Consider 2 laps of gait training. Consider standing exercise; mini squats, standing marches and heel raises on foam pad to work on balance. Consider  forward step ups with 2in.  During this treatment session, the therapist was present, participating in and directing the treatment.  Cristal Ford, Student-PT 03/20/2023, 11:28 AM   Lorayne Bender PT DPT

## 2023-03-20 NOTE — Therapy (Deleted)
OUTPATIENT PHYSICAL THERAPY LOWER EXTREMITY treatment    Patient Name: Bonnie Gonzalez MRN: [id number] DOB:1956/08/27, 67 y.o., female Today's Date: 03/20/2023  END OF SESSION:  PT End of Session - 03/20/23 1019     Visit Number 13    Number of Visits 22    Date for PT Re-Evaluation 04/20/23    PT Start Time 1015    Activity Tolerance Patient tolerated treatment well    Behavior During Therapy [facility] for tasks assessed/performed             Progress Note Reporting Period 01/15/2023 to 03/09/2023  See note below for Objective Data and Assessment of Progress/Goals.        Past Medical History:  Diagnosis Date   Acquired hypothyroidism 10/01/2010   Allergic rhinitis 07/20/2009   Arthritis    Back pain 10/18/2015   Bilateral leg edema 03/31/2018   Centrilobular emphysema 02/04/2019   HRCT 09/15/18   Chronic diastolic CHF (congestive heart failure) 01/28/2019   Chronic respiratory failure with hypoxia 02/04/2019   6 Min Walk 08/31/18 at [facility]   Chronic urticaria 12/21/2019   COPD (chronic obstructive pulmonary disease) 09/01/2018   Quit smoking 2010  - Spirometry 08/31/2018  FEV1 1.1 (51%)  Ratio 66 s prior rx with typical curvature  - 08/31/2018  After extensive coaching inhaler device,  effectiveness =    90% with elipta > try anoro sample > no better so d/c'  - PFT's  11/10/2018  FEV1 1.24 (59 % ) ratio 75 (ratio with fev1/VC =  63)  p no % improvement from saba p nothing prior to study with DLCO  105  % corrects to 132  %    DOE (dyspnea on exertion) 08/31/2018   Onset 05/2018 with background of unexplained leg swelling x 01/2018   -echo 04/13/18 just G1   diastolic dysfunction s PH   -  08/31/2018   Walked RA x one lap @ 185 stopped due to  Sob/ sats 89% at nl pace  - Spirometry 08/31/2018  FEV1 1.1 (51%)  Ratio 66 s prior rx  - 09/28/2018 could not do full pfts 09/28/2018  - Collagen vasc profile 09/28/18  For ESR =  54 (was 102 before prednisone)  Neg collag    Dyshidrotic eczema 12/13/2010   Essential hypertension 01/26/2019   Generalized anxiety disorder with panic attacks 01/20/2018   GERD (gastroesophageal reflux disease) 11/03/2007   Headache 11/03/2007   Irritable bowel syndrome 07/13/2009   Major depressive disorder 07/17/2020   Mixed hyperlipidemia 07/20/2009   Obesity    OSA (obstructive sleep apnea) 07/26/2015   HST 08/2015 mild OSA, AHI 8/hour, lowest desaturation 81%, desaturations noted without respiratory events  Formatting of this note might be different from the original. PSG 05/20/19 AHI 25.1   Osteopenia of multiple sites 06/01/2019   DEXA 05/24/2019: Right femur neck T- 2.2, left femur neck T- 1.5, right total femur T -0.5, left total femur T -0.5, AP total spine T- 1.9. FRAX 10-year probability of major osteoporotic fracture is 8.8% and a hip fracture is 1.2%  DEXA 05/05/2019: Right femur neck T- 1.9, left femur neck T- 2.0, right total femur T -1.3, left total femur   Oxygen deficiency    Postinflammatory pulmonary fibrosis 09/15/2018   ESR    10/161/9  = 102   > rx short term prednisone ? slt improvement in symptoms HRCT 09/15/2018  1. Very mild basilar subpleural ground-glass, reticulation and traction bronchiolectasis, findings which may be  minimally progressive from 06/29/2017 and are indicative of interstitial lung disease such as nonspecific interstitial pneumonitis. Findings are indeterminate for UIP per consensus guidelin   Psychophysiological insomnia 03/29/2019   Type 2 diabetes mellitus 02/09/2017   UTI (urinary tract infection)    Vertigo 03/29/2014   Vitamin D deficiency 09/15/2020   Past Surgical History:  Procedure Laterality Date   ANKLE FRACTURE SURGERY Right    benign tumor      removed from groin   COLONOSCOPY WITH PROPOFOL N/A 05/15/2022   Procedure: COLONOSCOPY WITH PROPOFOL;  Surgeon: Napoleon Form, MD;  Location: [facility];  Service: Gastroenterology;  Laterality: N/A;   HOT HEMOSTASIS N/A  05/15/2022   Procedure: HOT HEMOSTASIS (ARGON PLASMA COAGULATION/BICAP);  Surgeon: Napoleon Form, MD;  Location: [facility];  Service: Gastroenterology;  Laterality: N/A;   POLYPECTOMY  05/15/2022   Procedure: POLYPECTOMY;  Surgeon: Napoleon Form, MD;  Location: [facility];  Service: Gastroenterology;;   WRIST ARTHROCENTESIS N/A 2001   Patient Active Problem List   Diagnosis Date Noted   Morbid obesity 06/16/2022   Physical deconditioning 06/16/2022   AVM (arteriovenous malformation) of colon, acquired with hemorrhage    Positive colorectal cancer screening using Cologuard test    Polyp of sigmoid colon    Polyp of transverse colon    Polyp of cecum    COVID-19 virus infection 11/21/2021   Vitamin D deficiency 09/15/2020   Major depressive disorder 07/17/2020   Chronic urticaria 12/21/2019   Osteopenia of multiple sites 06/01/2019   Psychophysiological insomnia 03/29/2019   Centrilobular emphysema 02/04/2019   Chronic respiratory failure with hypoxia 02/04/2019   Chronic diastolic CHF (congestive heart failure) 01/28/2019   Essential hypertension 01/26/2019   Postinflammatory pulmonary fibrosis 09/15/2018   COPD (chronic obstructive pulmonary disease) 09/01/2018   DOE (dyspnea on exertion) 08/31/2018   Bilateral leg edema 03/31/2018   Generalized anxiety disorder with panic attacks 01/20/2018   Type 2 diabetes mellitus 02/09/2017   Back pain 10/18/2015   OSA (obstructive sleep apnea) 07/26/2015   Vertigo 03/29/2014   Dyshidrotic eczema 12/13/2010   Acquired hypothyroidism 10/01/2010   Mixed hyperlipidemia 07/20/2009   Allergic rhinitis 07/20/2009   Irritable bowel syndrome 07/13/2009   GERD (gastroesophageal reflux disease) 11/03/2007   Headache 11/03/2007    PCP: Gershon Crane MD   REFERRING PROVIDER: Gershon Crane MD   REFERRING DIAG:  Diagnosis  J96.11 (ICD-10-CM) - Chronic respiratory failure with hypoxia (HCC)    THERAPY DIAG:  No diagnosis  found.  Rationale for Evaluation and Treatment: Rehabilitation  ONSET DATE: has been on O2 for 3 years   SUBJECTIVE:   SUBJECTIVE STATEMENT: Pt reports she called MD regarding her O2 levels and left a message on Friday. "I still haven't heard from them." Pt reports she has had to bump up to 3L now each time she goes out of the house.   PERTINENT HISTORY: Knees, Loweback, and wrist arthritis. CHF taking lasix, post inflamitory pulmonary fibrosis; vertigo ( still has syncope at times)  PAIN:  Are you having pain? Yes: NPRS scale: 4/10 Pain location: Left knee Pain description: Aching Aggravating factors: Standing walking Relieving factors: Sitting  PRECAUTIONS: falls   WEIGHT BEARING RESTRICTIONS: No  FALLS:  Has patient fallen in last 6 months? No but has ad some near falls   LIVING ENVIRONMENT: Ramp into the house   OCCUPATION: retired  Recreation: would like to be able to get back to gardening  PLOF: Independent with household mobility with device  PATIENT GOALS: To increase endurance  NEXT MD VISIT: Nothing scheduled  OBJECTIVE:   DIAGNOSTIC FINDINGS: Nothing of the knee   COGNITION: Overall cognitive status: Within functional limits for tasks assessed     SENSATION: Has had tinglining in her hands. Has been using wrist guards   MUSCLE LENGTH:  POSTURE: rounded shoulders, forward head, and flexed trunk    LOWER EXTREMITY ROM:  Passive ROM Right eval Left eval  Hip flexion    Hip extension    Hip abduction    Hip adduction    Hip internal rotation    Hip external rotation    Knee flexion  Painful at end range  Knee extension    Ankle dorsiflexion    Ankle plantarflexion    Ankle inversion    Ankle eversion     (Blank rows = not tested)  LOWER EXTREMITY MMT:  MMT Right eval Left eval Right 4/8 Left 4//8  Hip flexion 19.7 17.0 33.4 29  Hip extension      Hip abduction 36.9 40.3 35 40.9  Hip adduction      Hip internal rotation       Hip external rotation      Knee flexion      Knee extension 20 18.7 45 35  Ankle dorsiflexion      Ankle plantarflexion      Ankle inversion      Ankle eversion       (Blank rows = not tested)  LOWER EXTREMITY SPECIAL TESTS:   IE: 5x sit to stand: 24 sec   Hr 85  Sao2 96   01/26/23: walk test: 244ft total- seated rest break at 146ft and paused timer. 4/15: 5xSTS-21sec  GAIT:  TODAY'S TREATMENT:                                                                                                                               DATE:   4/15 Nu-step L4 x85mins 2X150 ft (97% at 3L), 100 ft (99%). Did drop to 89% when drinking water after first round of gait training.  Biceps curls 3x10 2lbs Chest press 3x10 2lbs  Vitals    4/12 Nu-step L4 x26mins 150 ft, rest break then another 100 ft of walking Biceps curls 3x10 2lbs Chest press 3x10 2lbs Overhead shoulder press 3x10 2lbs  Yellow band horizontal abduction 3x10 Heel raise 3x10 March x10ea  4/9 Nu-step L5 x36mins 150 ft, rest break then another 100 ft of walking Biceps curls 3x10 2lbs Chest press 3x10 2lbs Overhead shoulder press 3x10 2lbs  Yellow band flexion 3x10 Yellow band horizontal abduction 3x10 Yellow band ER 3x10 Heel raise 3x10   Performed progress note of patient, review goals and plan of care   PATIENT EDUCATION:  Education details: HEP, symptom management, activity progression Person educated: Patient Education method: Explanation, Demonstration, Tactile cues, and Verbal cues Education comprehension: verbalized understanding, returned demonstration, verbal cues required, tactile cues required, and  needs further education  HOME EXERCISE PROGRAM: Access Code: [email or web address] URL: [email or web address] Date: 01/19/2023 Prepared by: Riki Altes  Exercises - Heel Raises with Counter Support  - 1-2 x daily - 7 x weekly - 2 sets - 10 reps - Standing March with Counter Support  - 1-2 x  daily - 7 x weekly - 2 sets - 10 reps - Standing Hip Abduction with Counter Support  - 1-2 x daily - 7 x weekly - 2 sets - 10 reps - Standing Hip Extension with Counter Support  - 1-2 x daily - 7 x weekly - 2 sets - 10 reps - Mini Squat with Counter Support  - 1-2 x daily - 7 x weekly - 2 sets - 10 reps - Standing Knee Flexion with Counter Support  - 1-2 x daily - 7 x weekly - 2 sets - 10 reps  ASSESSMENT:  CLINICAL IMPRESSION: Pt did report episode of mild light headedness during a seated rest break in clinic. She has been having these since late last week. Opted to take BP again. Two trials:161/70mmHg and 165/71mmHg. Advised pt call MD office again since they have not reached back out yet. Will continue to monitor her sx of lightheadedness and dyspnea in clinic.   OBJECTIVE IMPAIRMENTS: Abnormal gait, cardiopulmonary status limiting activity, decreased activity tolerance, decreased balance, decreased endurance, decreased mobility, difficulty walking, decreased ROM, decreased strength, and pain.   ACTIVITY LIMITATIONS: carrying, lifting, bending, standing, squatting, stairs, transfers, bathing, dressing, and locomotion level  PARTICIPATION LIMITATIONS: meal prep, cleaning, laundry, driving, shopping, community activity, and yard work  PERSONAL FACTORS: 3+ comorbidities: Knees, Loweback, and wrist arthritis. CHF taking lasix, post inflamitory pulmonary fibrosis; vertigo ( still has syncope at times) are also affecting patient's functional outcome.   REHAB POTENTIAL: Good  CLINICAL DECISION MAKING: Evolving/moderate complexity declining overall mobility   EVALUATION COMPLEXITY: Moderate   GOALS: Goals reviewed with patient? Yes  SHORT TERM GOALS: Target date: 02/13/2023   Therapy will perform 6-minute walk test with patient and come up with baseline goals Baseline: Goal status: MET 01/26/23  2.  Patient will decrease sit to stand time time by 5 seconds Baseline:  Goal status: In  Progress (4/15)  3.  Patient will perform 10 minutes of interval training on NuStep while maintaining a heart rate less than 110 Baseline:  Goal status: Met 4/8  LONG TERM GOALS: Target date: 03/13/2023    Patient will have a complete exercise program that she can take to [employer] sne[employer] and continue long-term Baseline:  Goal status: progressing towards program  2.  Patient will ambulate 600 feet without self-report of dyspnea in order to attend appointments without being short of breath Baseline:  Goal status:  not achieved, in progress 4/9  3.  Patient will stand for greater than 20 minutes without increased pain in left knee and increased dyspnea in order to perform ADLs Baseline:  Goal status: In progress 4/9    PLAN:  PT FREQUENCY: 2x/week  PT DURATION: 8 weeks  PLANNED INTERVENTIONS: Therapeutic exercises, Therapeutic activity, Neuromuscular re-education, Balance training, Gait training, Patient/Family education, Self Care, Joint mobilization, Stair training, DME instructions, Aquatic Therapy, Dry Needling, Electrical stimulation, Spinal mobilization, Cryotherapy, Moist heat, Ultrasound, and Manual therapy  PLAN FOR NEXT SESSION: retest sit to stand, continue to progress patient HEP. Consider standing exercise, mini squats and standing marches. Progress to stair training and airex pad for balance.     Dessie Coma, PT 03/20/2023, 10:21 AM

## 2023-03-23 ENCOUNTER — Encounter (HOSPITAL_BASED_OUTPATIENT_CLINIC_OR_DEPARTMENT_OTHER): Payer: Self-pay

## 2023-03-23 ENCOUNTER — Ambulatory Visit (HOSPITAL_BASED_OUTPATIENT_CLINIC_OR_DEPARTMENT_OTHER): Payer: PPO

## 2023-03-23 DIAGNOSIS — R2689 Other abnormalities of gait and mobility: Secondary | ICD-10-CM | POA: Diagnosis not present

## 2023-03-23 DIAGNOSIS — R5381 Other malaise: Secondary | ICD-10-CM

## 2023-03-23 NOTE — Therapy (Signed)
OUTPATIENT PHYSICAL THERAPY LOWER EXTREMITY treatment    Patient Name: Kemiyah Geneva Barrero MRN: [id number] DOB:Jan 06, 1956, 67 y.o., female Today's Date: 03/23/2023  END OF SESSION:  PT End of Session - 03/23/23 1349     Visit Number 14    Number of Visits 22    Date for PT Re-Evaluation 04/20/23    Authorization Type progress note done on visit 10    PT Start Time 1308    PT Stop Time 1350    PT Time Calculation (min) 42 min    Activity Tolerance Patient tolerated treatment well    Behavior During Therapy [facility] for tasks assessed/performed                Past Medical History:  Diagnosis Date   Acquired hypothyroidism 10/01/2010   Allergic rhinitis 07/20/2009   Arthritis    Back pain 10/18/2015   Bilateral leg edema 03/31/2018   Centrilobular emphysema 02/04/2019   HRCT 09/15/18   Chronic diastolic CHF (congestive heart failure) 01/28/2019   Chronic respiratory failure with hypoxia 02/04/2019   6 Min Walk 08/31/18 at [facility]   Chronic urticaria 12/21/2019   COPD (chronic obstructive pulmonary disease) 09/01/2018   Quit smoking 2010  - Spirometry 08/31/2018  FEV1 1.1 (51%)  Ratio 66 s prior rx with typical curvature  - 08/31/2018  After extensive coaching inhaler device,  effectiveness =    90% with elipta > try anoro sample > no better so d/c'  - PFT's  11/10/2018  FEV1 1.24 (59 % ) ratio 75 (ratio with fev1/VC =  63)  p no % improvement from saba p nothing prior to study with DLCO  105  % corrects to 132  %    DOE (dyspnea on exertion) 08/31/2018   Onset 05/2018 with background of unexplained leg swelling x 01/2018   -echo 04/13/18 just G1   diastolic dysfunction s PH   -  08/31/2018   Walked RA x one lap @ 185 stopped due to  Sob/ sats 89% at nl pace  - Spirometry 08/31/2018  FEV1 1.1 (51%)  Ratio 66 s prior rx  - 09/28/2018 could not do full pfts 09/28/2018  - Collagen vasc profile 09/28/18  For ESR =  54 (was 102 before prednisone)  Neg collag   Dyshidrotic eczema  12/13/2010   Essential hypertension 01/26/2019   Generalized anxiety disorder with panic attacks 01/20/2018   GERD (gastroesophageal reflux disease) 11/03/2007   Headache 11/03/2007   Irritable bowel syndrome 07/13/2009   Major depressive disorder 07/17/2020   Mixed hyperlipidemia 07/20/2009   Obesity    OSA (obstructive sleep apnea) 07/26/2015   HST 08/2015 mild OSA, AHI 8/hour, lowest desaturation 81%, desaturations noted without respiratory events  Formatting of this note might be different from the original. PSG 05/20/19 AHI 25.1   Osteopenia of multiple sites 06/01/2019   DEXA 05/24/2019: Right femur neck T- 2.2, left femur neck T- 1.5, right total femur T -0.5, left total femur T -0.5, AP total spine T- 1.9. FRAX 10-year probability of major osteoporotic fracture is 8.8% and a hip fracture is 1.2%  DEXA 05/05/2019: Right femur neck T- 1.9, left femur neck T- 2.0, right total femur T -1.3, left total femur   Oxygen deficiency    Postinflammatory pulmonary fibrosis 09/15/2018   ESR    10/161/9  = 102   > rx short term prednisone ? slt improvement in symptoms HRCT 09/15/2018  1. Very mild basilar subpleural ground-glass, reticulation and traction  bronchiolectasis, findings which may be minimally progressive from 06/29/2017 and are indicative of interstitial lung disease such as nonspecific interstitial pneumonitis. Findings are indeterminate for UIP per consensus guidelin   Psychophysiological insomnia 03/29/2019   Type 2 diabetes mellitus 02/09/2017   UTI (urinary tract infection)    Vertigo 03/29/2014   Vitamin D deficiency 09/15/2020   Past Surgical History:  Procedure Laterality Date   ANKLE FRACTURE SURGERY Right    benign tumor      removed from groin   COLONOSCOPY WITH PROPOFOL N/A 05/15/2022   Procedure: COLONOSCOPY WITH PROPOFOL;  Surgeon: Napoleon Form, MD;  Location: [facility];  Service: Gastroenterology;  Laterality: N/A;   HOT HEMOSTASIS N/A 05/15/2022   Procedure: HOT  HEMOSTASIS (ARGON PLASMA COAGULATION/BICAP);  Surgeon: Napoleon Form, MD;  Location: [facility];  Service: Gastroenterology;  Laterality: N/A;   POLYPECTOMY  05/15/2022   Procedure: POLYPECTOMY;  Surgeon: Napoleon Form, MD;  Location: [facility];  Service: Gastroenterology;;   WRIST ARTHROCENTESIS N/A 2001   Patient Active Problem List   Diagnosis Date Noted   Morbid obesity 06/16/2022   Physical deconditioning 06/16/2022   AVM (arteriovenous malformation) of colon, acquired with hemorrhage    Positive colorectal cancer screening using Cologuard test    Polyp of sigmoid colon    Polyp of transverse colon    Polyp of cecum    COVID-19 virus infection 11/21/2021   Vitamin D deficiency 09/15/2020   Major depressive disorder 07/17/2020   Chronic urticaria 12/21/2019   Osteopenia of multiple sites 06/01/2019   Psychophysiological insomnia 03/29/2019   Centrilobular emphysema 02/04/2019   Chronic respiratory failure with hypoxia 02/04/2019   Chronic diastolic CHF (congestive heart failure) 01/28/2019   Essential hypertension 01/26/2019   Postinflammatory pulmonary fibrosis 09/15/2018   COPD (chronic obstructive pulmonary disease) 09/01/2018   DOE (dyspnea on exertion) 08/31/2018   Bilateral leg edema 03/31/2018   Generalized anxiety disorder with panic attacks 01/20/2018   Type 2 diabetes mellitus 02/09/2017   Back pain 10/18/2015   OSA (obstructive sleep apnea) 07/26/2015   Vertigo 03/29/2014   Dyshidrotic eczema 12/13/2010   Acquired hypothyroidism 10/01/2010   Mixed hyperlipidemia 07/20/2009   Allergic rhinitis 07/20/2009   Irritable bowel syndrome 07/13/2009   GERD (gastroesophageal reflux disease) 11/03/2007   Headache 11/03/2007    PCP: Gershon Crane MD   REFERRING PROVIDER: Gershon Crane MD   REFERRING DIAG:  Diagnosis  J96.11 (ICD-10-CM) - Chronic respiratory failure with hypoxia (HCC)    THERAPY DIAG:  Physical deconditioning  Other abnormalities of  gait and mobility  Rationale for Evaluation and Treatment: Rehabilitation  ONSET DATE: has been on O2 for 3 years   SUBJECTIVE:   SUBJECTIVE STATEMENT: Pt reports she is still hsving episodes of light headedness. Can occurs at rest or with activity. She denies soreness after progressions last visit. Had a near fall this weekend when re-filling aquarium. Able to catch self with nearby walker. "I can't remember if I was dizzy or not. I stumbled."  PERTINENT HISTORY: Knees, Loweback, and wrist arthritis. CHF taking lasix, post inflamitory pulmonary fibrosis; vertigo ( still has syncope at times)  PAIN:  Are you having pain? Yes: NPRS scale: 4/10 Pain location: Left knee Pain description: Aching Aggravating factors: Standing walking Relieving factors: Sitting  PRECAUTIONS: falls   WEIGHT BEARING RESTRICTIONS: No  FALLS:  Has patient fallen in last 6 months? No but has ad some near falls   LIVING ENVIRONMENT: Ramp into the house   OCCUPATION: retired  Recreation: would like to be able to get back to gardening  PLOF: Independent with household mobility with device  PATIENT GOALS: To increase endurance  NEXT MD VISIT: Nothing scheduled  OBJECTIVE:   DIAGNOSTIC FINDINGS: Nothing of the knee   COGNITION: Overall cognitive status: Within functional limits for tasks assessed     SENSATION: Has had tinglining in her hands. Has been using wrist guards   MUSCLE LENGTH:  POSTURE: rounded shoulders, forward head, and flexed trunk    LOWER EXTREMITY ROM:  Passive ROM Right eval Left eval  Hip flexion    Hip extension    Hip abduction    Hip adduction    Hip internal rotation    Hip external rotation    Knee flexion  Painful at end range  Knee extension    Ankle dorsiflexion    Ankle plantarflexion    Ankle inversion    Ankle eversion     (Blank rows = not tested)  LOWER EXTREMITY MMT:  MMT Right eval Left eval Right 4/8 Left 4//8  Hip flexion 19.7 17.0  33.4 29  Hip extension      Hip abduction 36.9 40.3 35 40.9  Hip adduction      Hip internal rotation      Hip external rotation      Knee flexion      Knee extension 20 18.7 45 35  Ankle dorsiflexion      Ankle plantarflexion      Ankle inversion      Ankle eversion       (Blank rows = not tested)  LOWER EXTREMITY SPECIAL TESTS:   IE: 5x sit to stand: 24 sec   Hr 85  Sao2 96   01/26/23: walk test: 224ft total- seated rest break at 132ft and paused timer. 4/15: 5xSTS-21sec  GAIT:  TODAY'S TREATMENT:                                                                                                                               DATE:   4/22 NU step x5 mins L3 Gait 19ft x4 with seated rest breaks and O2 monitoring Bicep curls 3x10 3lbs Chest Press 3x10 3lbs Overhead shoulder press 3x10 3lbs Hip abduction green band 3x10 Seated bil ER 2x10 red band   4/19 NU step x1mins L2 (90% at L3), x2mins L3 (94% at L3) X64mins L4 (96% at L3) Bicep curls 3x10 3lbs Chest Press 3x10 3lbs Overhead shoulder press 3x10 3lbs Hip abduction green band 3x10 Hamstring curls red band 2x10 Seated ER 2x10 red band Standing heel raise 2x10   4/15 Nu-step L4 x49mins 2X150 ft (97% at 3L), 100 ft (99%). Did drop to 89% when drinking water after first round of gait training.  Biceps curls 3x10 2lbs Chest press 3x10 2lbs  Vitals    4/12 Nu-step L4 x20mins 150 ft, rest break then another 100 ft of walking Biceps curls 3x10 2lbs Chest  press 3x10 2lbs Overhead shoulder press 3x10 2lbs  Yellow band horizontal abduction 3x10 Heel raise 3x10 March x10ea  4/9 Nu-step L5 x33mins 150 ft, rest break then another 100 ft of walking Biceps curls 3x10 2lbs Chest press 3x10 2lbs Overhead shoulder press 3x10 2lbs  Yellow band flexion 3x10 Yellow band horizontal abduction 3x10 Yellow band ER 3x10 Heel raise 3x10   Performed progress note of patient, review goals and plan of  care   PATIENT EDUCATION:  Education details: HEP, symptom management, activity progression Person educated: Patient Education method: Explanation, Demonstration, Tactile cues, and Verbal cues Education comprehension: verbalized understanding, returned demonstration, verbal cues required, tactile cues required, and needs further education  HOME EXERCISE PROGRAM: Access Code: [email or web address] URL: [email or web address] Date: 01/19/2023 Prepared by: Riki Altes  Exercises - Heel Raises with Counter Support  - 1-2 x daily - 7 x weekly - 2 sets - 10 reps - Standing March with Counter Support  - 1-2 x daily - 7 x weekly - 2 sets - 10 reps - Standing Hip Abduction with Counter Support  - 1-2 x daily - 7 x weekly - 2 sets - 10 reps - Standing Hip Extension with Counter Support  - 1-2 x daily - 7 x weekly - 2 sets - 10 reps - Mini Squat with Counter Support  - 1-2 x daily - 7 x weekly - 2 sets - 10 reps - Standing Knee Flexion with Counter Support  - 1-2 x daily - 7 x weekly - 2 sets - 10 reps  ASSESSMENT:  CLINICAL IMPRESSION: Patient entry O2 level was 82% at entry. Rose to 94% before nu-step. After walking 159ft, O2 decreased to 82%. Rose to 97% before continuing. With seated activity, pt's O2 level dropped to 92%.  Continued with 3lb weights for UE Strengthening without complaints. Will continue to monitor her O2 level and her light headedness.   OBJECTIVE IMPAIRMENTS: Abnormal gait, cardiopulmonary status limiting activity, decreased activity tolerance, decreased balance, decreased endurance, decreased mobility, difficulty walking, decreased ROM, decreased strength, and pain.   ACTIVITY LIMITATIONS: carrying, lifting, bending, standing, squatting, stairs, transfers, bathing, dressing, and locomotion level  PARTICIPATION LIMITATIONS: meal prep, cleaning, laundry, driving, shopping, community activity, and yard work  PERSONAL FACTORS: 3+ comorbidities: Knees, Loweback, and wrist  arthritis. CHF taking lasix, post inflamitory pulmonary fibrosis; vertigo ( still has syncope at times) are also affecting patient's functional outcome.   REHAB POTENTIAL: Good  CLINICAL DECISION MAKING: Evolving/moderate complexity declining overall mobility   EVALUATION COMPLEXITY: Moderate   GOALS: Goals reviewed with patient? Yes  SHORT TERM GOALS: Target date: 02/13/2023   Therapy will perform 6-minute walk test with patient and come up with baseline goals Baseline: Goal status: MET 01/26/23  2.  Patient will decrease sit to stand time time by 5 seconds Baseline:  Goal status: In Progress (4/15)  3.  Patient will perform 10 minutes of interval training on NuStep while maintaining a heart rate less than 110 Baseline:  Goal status: Met 4/8  LONG TERM GOALS: Target date: 03/13/2023    Patient will have a complete exercise program that she can take to [employer] sneakers and continue long-term Baseline:  Goal status: progressing towards program  2.  Patient will ambulate 600 feet without self-report of dyspnea in order to attend appointments without being short of breath Baseline:  Goal status:  not achieved, in progress 4/9  3.  Patient will stand for greater than 20 minutes without increased pain in left knee and increased  dyspnea in order to perform ADLs Baseline:  Goal status: In progress 4/9    PLAN:  PT FREQUENCY: 2x/week  PT DURATION: 8 weeks  PLANNED INTERVENTIONS: Therapeutic exercises, Therapeutic activity, Neuromuscular re-education, Balance training, Gait training, Patient/Family education, Self Care, Joint mobilization, Stair training, DME instructions, Aquatic Therapy, Dry Needling, Electrical stimulation, Spinal mobilization, Cryotherapy, Moist heat, Ultrasound, and Manual therapy  PLAN FOR NEXT SESSION: Continue to progress patient HEP. Consider 2 laps of gait training. Consider standing exercise; mini squats, standing marches and heel raises on foam pad  to work on balance. Consider forward step ups with 2in.   Donnel Saxon Tyn[place]lee Bowlds, PTA 03/23/2023, 2:37 PM

## 2023-03-25 DIAGNOSIS — J9611 Chronic respiratory failure with hypoxia: Secondary | ICD-10-CM | POA: Diagnosis not present

## 2023-03-25 DIAGNOSIS — J432 Centrilobular emphysema: Secondary | ICD-10-CM | POA: Diagnosis not present

## 2023-03-25 DIAGNOSIS — J449 Chronic obstructive pulmonary disease, unspecified: Secondary | ICD-10-CM | POA: Diagnosis not present

## 2023-03-27 ENCOUNTER — Encounter (HOSPITAL_BASED_OUTPATIENT_CLINIC_OR_DEPARTMENT_OTHER): Payer: Self-pay | Admitting: Physical Therapy

## 2023-03-27 ENCOUNTER — Ambulatory Visit (HOSPITAL_BASED_OUTPATIENT_CLINIC_OR_DEPARTMENT_OTHER): Payer: PPO | Admitting: Physical Therapy

## 2023-03-27 DIAGNOSIS — R2689 Other abnormalities of gait and mobility: Secondary | ICD-10-CM | POA: Diagnosis not present

## 2023-03-27 DIAGNOSIS — R5381 Other malaise: Secondary | ICD-10-CM

## 2023-03-27 NOTE — Therapy (Signed)
OUTPATIENT PHYSICAL THERAPY LOWER EXTREMITY treatment    Patient Name: Bonnie Gonzalez MRN: [id number] DOB:Jan 06, 1956, 67 y.o., female Today's Date: 03/23/2023  END OF SESSION:  PT End of Session - 03/23/23 1349     Visit Number 14    Number of Visits 22    Date for PT Re-Evaluation 04/20/23    Authorization Type progress note done on visit 10    PT Start Time 1308    PT Stop Time 1350    PT Time Calculation (min) 42 min    Activity Tolerance Patient tolerated treatment well    Behavior During Therapy [facility] for tasks assessed/performed                Past Medical History:  Diagnosis Date   Acquired hypothyroidism 10/01/2010   Allergic rhinitis 07/20/2009   Arthritis    Back pain 10/18/2015   Bilateral leg edema 03/31/2018   Centrilobular emphysema 02/04/2019   HRCT 09/15/18   Chronic diastolic CHF (congestive heart failure) 01/28/2019   Chronic respiratory failure with hypoxia 02/04/2019   6 Min Walk 08/31/18 at [facility]   Chronic urticaria 12/21/2019   COPD (chronic obstructive pulmonary disease) 09/01/2018   Quit smoking 2010  - Spirometry 08/31/2018  FEV1 1.1 (51%)  Ratio 66 s prior rx with typical curvature  - 08/31/2018  After extensive coaching inhaler device,  effectiveness =    90% with elipta > try anoro sample > no better so d/c'  - PFT's  11/10/2018  FEV1 1.24 (59 % ) ratio 75 (ratio with fev1/VC =  63)  p no % improvement from saba p nothing prior to study with DLCO  105  % corrects to 132  %    DOE (dyspnea on exertion) 08/31/2018   Onset 05/2018 with background of unexplained leg swelling x 01/2018   -echo 04/13/18 just G1   diastolic dysfunction s PH   -  08/31/2018   Walked RA x one lap @ 185 stopped due to  Sob/ sats 89% at nl pace  - Spirometry 08/31/2018  FEV1 1.1 (51%)  Ratio 66 s prior rx  - 09/28/2018 could not do full pfts 09/28/2018  - Collagen vasc profile 09/28/18  For ESR =  54 (was 102 before prednisone)  Neg collag   Dyshidrotic eczema  12/13/2010   Essential hypertension 01/26/2019   Generalized anxiety disorder with panic attacks 01/20/2018   GERD (gastroesophageal reflux disease) 11/03/2007   Headache 11/03/2007   Irritable bowel syndrome 07/13/2009   Major depressive disorder 07/17/2020   Mixed hyperlipidemia 07/20/2009   Obesity    OSA (obstructive sleep apnea) 07/26/2015   HST 08/2015 mild OSA, AHI 8/hour, lowest desaturation 81%, desaturations noted without respiratory events  Formatting of this note might be different from the original. PSG 05/20/19 AHI 25.1   Osteopenia of multiple sites 06/01/2019   DEXA 05/24/2019: Right femur neck T- 2.2, left femur neck T- 1.5, right total femur T -0.5, left total femur T -0.5, AP total spine T- 1.9. FRAX 10-year probability of major osteoporotic fracture is 8.8% and a hip fracture is 1.2%  DEXA 05/05/2019: Right femur neck T- 1.9, left femur neck T- 2.0, right total femur T -1.3, left total femur   Oxygen deficiency    Postinflammatory pulmonary fibrosis 09/15/2018   ESR    10/161/9  = 102   > rx short term prednisone ? slt improvement in symptoms HRCT 09/15/2018  1. Very mild basilar subpleural ground-glass, reticulation and traction  bronchiolectasis, findings which may be minimally progressive from 06/29/2017 and are indicative of interstitial lung disease such as nonspecific interstitial pneumonitis. Findings are indeterminate for UIP per consensus guidelin   Psychophysiological insomnia 03/29/2019   Type 2 diabetes mellitus 02/09/2017   UTI (urinary tract infection)    Vertigo 03/29/2014   Vitamin D deficiency 09/15/2020   Past Surgical History:  Procedure Laterality Date   ANKLE FRACTURE SURGERY Right    benign tumor      removed from groin   COLONOSCOPY WITH PROPOFOL N/A 05/15/2022   Procedure: COLONOSCOPY WITH PROPOFOL;  Surgeon: Napoleon Form, MD;  Location: [facility];  Service: Gastroenterology;  Laterality: N/A;   HOT HEMOSTASIS N/A 05/15/2022   Procedure: HOT  HEMOSTASIS (ARGON PLASMA COAGULATION/BICAP);  Surgeon: Napoleon Form, MD;  Location: [facility];  Service: Gastroenterology;  Laterality: N/A;   POLYPECTOMY  05/15/2022   Procedure: POLYPECTOMY;  Surgeon: Napoleon Form, MD;  Location: [facility];  Service: Gastroenterology;;   WRIST ARTHROCENTESIS N/A 2001   Patient Active Problem List   Diagnosis Date Noted   Morbid obesity 06/16/2022   Physical deconditioning 06/16/2022   AVM (arteriovenous malformation) of colon, acquired with hemorrhage    Positive colorectal cancer screening using Cologuard test    Polyp of sigmoid colon    Polyp of transverse colon    Polyp of cecum    COVID-19 virus infection 11/21/2021   Vitamin D deficiency 09/15/2020   Major depressive disorder 07/17/2020   Chronic urticaria 12/21/2019   Osteopenia of multiple sites 06/01/2019   Psychophysiological insomnia 03/29/2019   Centrilobular emphysema 02/04/2019   Chronic respiratory failure with hypoxia 02/04/2019   Chronic diastolic CHF (congestive heart failure) 01/28/2019   Essential hypertension 01/26/2019   Postinflammatory pulmonary fibrosis 09/15/2018   COPD (chronic obstructive pulmonary disease) 09/01/2018   DOE (dyspnea on exertion) 08/31/2018   Bilateral leg edema 03/31/2018   Generalized anxiety disorder with panic attacks 01/20/2018   Type 2 diabetes mellitus 02/09/2017   Back pain 10/18/2015   OSA (obstructive sleep apnea) 07/26/2015   Vertigo 03/29/2014   Dyshidrotic eczema 12/13/2010   Acquired hypothyroidism 10/01/2010   Mixed hyperlipidemia 07/20/2009   Allergic rhinitis 07/20/2009   Irritable bowel syndrome 07/13/2009   GERD (gastroesophageal reflux disease) 11/03/2007   Headache 11/03/2007    PCP: Gershon Crane MD   REFERRING PROVIDER: Gershon Crane MD   REFERRING DIAG:  Diagnosis  J96.11 (ICD-10-CM) - Chronic respiratory failure with hypoxia (HCC)    THERAPY DIAG:  Physical deconditioning  Other abnormalities of  gait and mobility  Rationale for Evaluation and Treatment: Rehabilitation  ONSET DATE: has been on O2 for 3 years   SUBJECTIVE:   SUBJECTIVE STATEMENT: Patient reports with bilateral knee pain at entry of therapy. Patient had 2 minor spasm. Patient O2 level at entry was 94%.    PERTINENT HISTORY: Knees, Loweback, and wrist arthritis. CHF taking lasix, post inflamitory pulmonary fibrosis; vertigo ( still has syncope at times)  PAIN:  Are you having pain? Yes: NPRS scale: 4/10 Pain location: Left knee Pain description: Aching Aggravating factors: Standing walking Relieving factors: Sitting  PRECAUTIONS: falls   WEIGHT BEARING RESTRICTIONS: No  FALLS:  Has patient fallen in last 6 months? No but has ad some near falls   LIVING ENVIRONMENT: Ramp into the house   OCCUPATION: retired  Recreation: would like to be able to get back to gardening  PLOF: Independent with household mobility with device  PATIENT GOALS: To increase endurance  NEXT MD VISIT: Nothing scheduled  OBJECTIVE:   DIAGNOSTIC FINDINGS: Nothing of the knee   COGNITION: Overall cognitive status: Within functional limits for tasks assessed     SENSATION: Has had tinglining in her hands. Has been using wrist guards   MUSCLE LENGTH:  POSTURE: rounded shoulders, forward head, and flexed trunk    LOWER EXTREMITY ROM:  Passive ROM Right eval Left eval  Hip flexion    Hip extension    Hip abduction    Hip adduction    Hip internal rotation    Hip external rotation    Knee flexion  Painful at end range  Knee extension    Ankle dorsiflexion    Ankle plantarflexion    Ankle inversion    Ankle eversion     (Blank rows = not tested)  LOWER EXTREMITY MMT:  MMT Right eval Left eval Right 4/8 Left 4//8  Hip flexion 19.7 17.0 33.4 29  Hip extension      Hip abduction 36.9 40.3 35 40.9  Hip adduction      Hip internal rotation      Hip external rotation      Knee flexion      Knee  extension 20 18.7 45 35  Ankle dorsiflexion      Ankle plantarflexion      Ankle inversion      Ankle eversion       (Blank rows = not tested)  LOWER EXTREMITY SPECIAL TESTS:   IE: 5x sit to stand: 24 sec   Hr 85  Sao2 96   01/26/23: walk test: 269ft total- seated rest break at 171ft and paused timer. 4/15: 5xSTS-21sec  GAIT:  TODAY'S TREATMENT:                                                                                                                               DATE:   4/26 Nustep x26mins L2 95 O2 at entry  Bicep curls 3x10 3lbs Chest Press 3x10 3lbs 1 full lap with break in between  green band horizontal abduction 2x10 Green band er 2x10 Hamstring curls green band 2x10 Standing heel raises 2x10    4/22 NU step x5 mins L3 Gait 164ft x4 with seated rest breaks and O2 monitoring Bicep curls 3x10 3lbs Chest Press 3x10 3lbs Overhead shoulder press 3x10 3lbs Hip abduction green band 3x10 Seated bil ER 2x10 red band   4/19 NU step x13mins L2 (90% at L3), x65mins L3 (94% at L3) X53mins L4 (96% at L3) Bicep curls 3x10 3lbs Chest Press 3x10 3lbs Overhead shoulder press 3x10 3lbs Hip abduction green band 3x10 Hamstring curls red band 2x10 Seated ER 2x10 red band Standing heel raise 2x10   4/15 Nu-step L4 x32mins 2X150 ft (97% at 3L), 100 ft (99%). Did drop to 89% when drinking water after first round of gait training.  Biceps curls 3x10 2lbs Chest press 3x10 2lbs  Vitals  4/12 Nu-step L4 x7mins 150 ft, rest break then another 100 ft of walking Biceps curls 3x10 2lbs Chest press 3x10 2lbs Overhead shoulder press 3x10 2lbs  Yellow band horizontal abduction 3x10 Heel raise 3x10 March x10ea  4/9 Nu-step L5 x67mins 150 ft, rest break then another 100 ft of walking Biceps curls 3x10 2lbs Chest press 3x10 2lbs Overhead shoulder press 3x10 2lbs  Yellow band flexion 3x10 Yellow band horizontal abduction 3x10 Yellow band ER 3x10 Heel raise  3x10   Performed progress note of patient, review goals and plan of care   PATIENT EDUCATION:  Education details: HEP, symptom management, activity progression Person educated: Patient Education method: Explanation, Demonstration, Tactile cues, and Verbal cues Education comprehension: verbalized understanding, returned demonstration, verbal cues required, tactile cues required, and needs further education  HOME EXERCISE PROGRAM: Access Code: [email or web address] URL: [email or web address] Date: 01/19/2023 Prepared by: Riki Altes  Exercises - Heel Raises with Counter Support  - 1-2 x daily - 7 x weekly - 2 sets - 10 reps - Standing March with Counter Support  - 1-2 x daily - 7 x weekly - 2 sets - 10 reps - Standing Hip Abduction with Counter Support  - 1-2 x daily - 7 x weekly - 2 sets - 10 reps - Standing Hip Extension with Counter Support  - 1-2 x daily - 7 x weekly - 2 sets - 10 reps - Mini Squat with Counter Support  - 1-2 x daily - 7 x weekly - 2 sets - 10 reps - Standing Knee Flexion with Counter Support  - 1-2 x daily - 7 x weekly - 2 sets - 10 reps  ASSESSMENT:  CLINICAL IMPRESSION: Patient entry O2 level was 94% at entry. Patient had bilateral knee pain at entry of therapy. Patient completed nu step for 5 mins and O2 level was 95%. Continued with 3lb weights for UE Strengthening without complaints. Patient progress to green band for hamstring curls. Throughout therapy patient O2 level remain in the high 90s (94-97%). Will continue to monitor her O2 level and progress patient as tolerated.   OBJECTIVE IMPAIRMENTS: Abnormal gait, cardiopulmonary status limiting activity, decreased activity tolerance, decreased balance, decreased endurance, decreased mobility, difficulty walking, decreased ROM, decreased strength, and pain.   ACTIVITY LIMITATIONS: carrying, lifting, bending, standing, squatting, stairs, transfers, bathing, dressing, and locomotion level  PARTICIPATION  LIMITATIONS: meal prep, cleaning, laundry, driving, shopping, community activity, and yard work  PERSONAL FACTORS: 3+ comorbidities: Knees, Loweback, and wrist arthritis. CHF taking lasix, post inflamitory pulmonary fibrosis; vertigo ( still has syncope at times) are also affecting patient's functional outcome.   REHAB POTENTIAL: Good  CLINICAL DECISION MAKING: Evolving/moderate complexity declining overall mobility   EVALUATION COMPLEXITY: Moderate   GOALS: Goals reviewed with patient? Yes  SHORT TERM GOALS: Target date: 02/13/2023   Therapy will perform 6-minute walk test with patient and come up with baseline goals Baseline: Goal status: MET 01/26/23  2.  Patient will decrease sit to stand time time by 5 seconds Baseline:  Goal status: In Progress (4/15)  3.  Patient will perform 10 minutes of interval training on NuStep while maintaining a heart rate less than 110 Baseline:  Goal status: Met 4/8  LONG TERM GOALS: Target date: 03/13/2023    Patient will have a complete exercise program that she can take to Silver sneakers and continue long-term Baseline:  Goal status: progressing towards program  2.  Patient will ambulate 600 feet without self-report of dyspnea in order to attend appointments without  being short of breath Baseline:  Goal status:  not achieved, in progress 4/9  3.  Patient will stand for greater than 20 minutes without increased pain in left knee and increased dyspnea in order to perform ADLs Baseline:  Goal status: In progress 4/9    PLAN:  PT FREQUENCY: 2x/week  PT DURATION: 8 weeks  PLANNED INTERVENTIONS: Therapeutic exercises, Therapeutic activity, Neuromuscular re-education, Balance training, Gait training, Patient/Family education, Self Care, Joint mobilization, Stair training, DME instructions, Aquatic Therapy, Dry Needling, Electrical stimulation, Spinal mobilization, Cryotherapy, Moist heat, Ultrasound, and Manual therapy  PLAN FOR NEXT  SESSION: Continue to progress patient HEP. Consider 2 laps of gait training. Consider standing exercise; mini squats and standing marches on foam pad to work on balance. Consider forward step ups with 2in.   During this treatment session, the therapist was present, participating in and directing the treatment.   Lorayne Bender PT DPT  03/23/2023, 2:37 PM   Cristal Ford   I have reviewed and concur with this student's documentation.   During this treatment session, the therapist was present, participating in and directing the treatment.

## 2023-03-30 ENCOUNTER — Ambulatory Visit (HOSPITAL_BASED_OUTPATIENT_CLINIC_OR_DEPARTMENT_OTHER): Payer: PPO

## 2023-03-30 ENCOUNTER — Telehealth: Payer: Self-pay | Admitting: Family Medicine

## 2023-03-30 NOTE — Telephone Encounter (Signed)
Contacted Mylinh Deatra James to schedule their annual wellness visit. Appointment made for 04/15/16.  *(Rudell Cobb AWV direct phone # [phone]   Patient called need to r/s her 5/17 awv appt  r/s to 04/16/23*

## 2023-04-01 ENCOUNTER — Telehealth: Payer: Self-pay

## 2023-04-01 DIAGNOSIS — F411 Generalized anxiety disorder: Secondary | ICD-10-CM | POA: Diagnosis not present

## 2023-04-01 DIAGNOSIS — F330 Major depressive disorder, recurrent, mild: Secondary | ICD-10-CM | POA: Diagnosis not present

## 2023-04-01 DIAGNOSIS — F5101 Primary insomnia: Secondary | ICD-10-CM | POA: Diagnosis not present

## 2023-04-01 NOTE — Progress Notes (Signed)
Patient ID: Bonnie Gonzalez, female   DOB: 10-05-1956, 67 y.o.   MRN: [id number]  Care Management & Coordination Services Pharmacy Team  Reason for Encounter: Chart Prep for initial encounter on 04/03/23 at 9 am in office with Milas Kocher  Clinical Pharmacist.   Spoke with patient on 04/01/2023   Have you seen any other providers since your last visit? Patient reports none  Any changes in your medications or health? Patient reports none  Any side effects from any medications? Patient reports not that she has noticed, she states she has been having hot flashes and night sweats lately and wasn't sure if its medication related or menopausal symptoms still.  Do you have an symptoms or problems not managed by your medications? Patient reports none  Any concerns about your health right now? Patient reports she is on continuous oxygen during the day and uses a cpap at night, she states she gets very winded and quickly drops SATs with very little exertion, she states she has seen pulmonology for this and she was recently increased to 3L of oxygen.  Has your provider asked that you check blood pressure, blood sugar, or follow special diet at home? Patient reports she does not have a blood pressure cuff and is using the freestyle libre 2 for her glucose monitoring.  Do you get any type of exercise on a regular basis? Patient reports she has been in PT but uses oxygen and easily winded.  Can you think of a goal you would like to reach for your health? Patient reports not at this time.  Do you have any problems getting your medications? Patient reports since she has gotten the medicare extra help it has made a major difference in the cost of her medications, she states she has also gotten her mounjaro from the pharmacy and is ok there.  Is there anything that you would like to discuss during the appointment? Patient reports she would like to be walking more and have better mobility.  Patient aware to bring,  medications that do not need refrigeration and supplements to appointment   Chart review:  Recent office visits:  12/05/22 Nelwyn Salisbury, MD - Patient presented for Acute bronchitis with COPD and other concerns. Prescribed Levofloxacin.   11/11/22 Nelwyn Salisbury, MD - Patient presented for Bilateral hand numbness and other concerns. Prescribed Terbinafine. Stopped Methylprednisolone.   Recent consult visits:  03/27/23 Dessie Coma, PT - Patient presented for Physical deconditioning and other concerns No medication changes.  03/19/23 Tat, Octaviano Batty, DO (Neurology) - Patient presented via video for Essential tremor. No medication changes.   03/03/23 Benita Stabile, FNP (Endo) - Patient presented for Type 2 diabetes mellitus with hyperglycemia with long term current use of insulin. Increased Toujeo to 80u 2x daily  01/06/25 Napoleon Form, MD  [facility]) - Patient presented for Chronic idiopathic constipation and other concerns. No medication changes  01/05/23 Love, Synetta Shadow., MD  [facility]) - Patient presented via telemedicine for major depressive disorder recurrent and other concerns. Stopped Ultravate.   10/28/22 Benita Stabile, FNP (Endocrinology) - Patient presented for Type 2 diabetes mellitus with hyperglycemia with long term current use of insulin. No medication changes.    Hospital visits:  None in previous 6 months   Fill History : albuterol (ACCUNEB) nebulizer solution 1.25 mg/91mL 01/20/2022 13   ALPRAZOLAM 0.5MG     TAB 10/06/2022 30   ATORVASTATIN 80MG    TAB 09/01/2022 90   AZELASTINE  0.1% SPR 07/25/2022 50   DULOXETINE 60MG      CAP 10/27/2022 90   ESZOPICLONE 2MG  TAB 09/24/2021 30   TRELEGY ELLIPTA 100-62.5-25 AE 03/17/2022 30   FUROSEMIDE 40MG      TAB 04/28/2022 90   HYDROXYZINE PAMOATE 25MG  CAP 09/24/2022 30   NOVOLOG FLEXPEN 100UNIT/ML INJ 10/29/2022 30   TOUJEO MAX SOLO 300IU/ML INJ 11/18/2022 29   LEVOTHYROXIN TAB  06/16/2022 90   METHOCARBAM 500MG    TAB 05/24/2021 30   NAPROXEN 500MG       TAB 10/30/2021 30   PRIMIDONE 50MG       TAB 10/05/2022 90   TEMAZEPAM 15MG       CAP 11/14/2022 30   MOUNJARO 12.5/0.5   INJ 10/28/2022 28    Star Rating Drugs:  Atorvastatin 80 mg - Last filled 03/22/23 90 DS at [facility] Verified   Care Gaps: Diabetic Kidney eval urine - Overdue Foot Exam - Overdue Eye Exam - Overdue COVID Booster - Overdue HGB A1C - Overdue Lung Cancer Screen - Overdue AWV Overdue Hepatitis C Screen - Postponed   Pamala Duffel [facility] Clinical Pharmacist Assistant [phone]

## 2023-04-01 NOTE — Progress Notes (Unsigned)
Care Management & Coordination Services Pharmacy Note  04/01/2023 Name:  Bonnie Gonzalez MRN:  [id number] DOB:  1956/05/12  Summary: ***  Recommendations/Changes made from today's visit: ***  Follow up plan: ***   Subjective: Bonnie Gonzalez is an 67 y.o. year old female who is a primary patient of Nelwyn Salisbury, MD.  The care coordination team was consulted for assistance with disease management and care coordination needs.    {CCMTELEPHONEFACETOFACE:21091510} for initial visit.  Recent office visits: 12/05/22 Nelwyn Salisbury, MD - Patient presented for Acute bronchitis with COPD and other concerns. Prescribed Levofloxacin.    11/11/22 Nelwyn Salisbury, MD - Patient presented for Bilateral hand numbness and other concerns. Prescribed Terbinafine. Stopped Methylprednisolone.  Recent consult visits: 03/27/23 Dessie Coma, PT - Patient presented for Physical deconditioning and other concerns No medication changes.   03/19/23 Tat, Octaviano Batty, DO (Neurology) - Patient presented via video for Essential tremor. No medication changes.    03/03/23 Benita Stabile, FNP (Endo) - Patient presented for Type 2 diabetes mellitus with hyperglycemia with long term current use of insulin. Increased Toujeo to 80u 2x daily   01/06/25 Napoleon Form, MD  [facility]) - Patient presented for Chronic idiopathic constipation and other concerns. No medication changes   01/05/23 Love, Synetta Shadow., MD  [facility]) - Patient presented via telemedicine for major depressive disorder recurrent and other concerns. Stopped Ultravate.    10/28/22 Benita Stabile, FNP (Endocrinology) - Patient presented for Type 2 diabetes mellitus with hyperglycemia with long term current use of insulin. No medication changes.   Hospital visits: None in previous 6 months   Objective:  Lab Results  Component Value Date   CREATININE 0.64 05/28/2022   BUN 13 05/28/2022   GFR 91.44 03/04/2022   EGFR 97  05/28/2022   GFRNONAA >60 02/11/2019   GFRAA >60 02/11/2019   NA 141 05/28/2022   K 4.1 05/28/2022   CALCIUM 9.6 05/28/2022   CO2 30 (H) 05/28/2022   GLUCOSE 137 (H) 05/28/2022    Lab Results  Component Value Date/Time   HGBA1C 7.5 (H) 03/04/2022 10:03 AM   HGBA1C 9.1 06/14/2021 12:00 AM   HGBA1C 10.0 (H) 01/11/2019 02:10 AM   GFR 91.44 03/04/2022 10:03 AM   GFR 80.60 10/25/2018 04:55 PM   MICROALBUR <0.7 10/18/2015 11:26 AM   MICROALBUR 0.5 04/11/2014 02:11 PM    Last diabetic Eye exam: No results found for: "HMDIABEYEEXA"  Last diabetic Foot exam: No results found for: "HMDIABFOOTEX"   Lab Results  Component Value Date   CHOL 131 03/04/2022   HDL 42.10 03/04/2022   LDLCALC 73 03/04/2022   LDLDIRECT 176.0 04/10/2021   TRIG 81.0 03/04/2022   CHOLHDL 3 03/04/2022       Latest Ref Rng & Units 03/04/2022   10:03 AM 11/13/2021    9:14 AM 09/30/2021    9:15 AM  Hepatic Function  Total Protein 6.0 - 8.3 g/dL 7.2     Albumin 3.5 - 5.2 g/dL 3.9     AST 0 - 37 U/L 25     ALT 0 - 35 U/L 29  44  35   Alk Phosphatase 39 - 117 U/L 92     Total Bilirubin 0.2 - 1.2 mg/dL 0.4     Bilirubin, Direct 0.0 - 0.3 mg/dL 0.1       Lab Results  Component Value Date/Time   TSH 2.87 03/04/2022 10:03 AM   TSH 3.110 09/03/2021 12:12  PM   FREET4 0.98 03/24/2017 10:45 AM       Latest Ref Rng & Units 03/04/2022   10:03 AM 09/03/2021   12:12 PM 02/11/2019    2:16 AM  CBC  WBC 4.0 - 10.5 K/uL 9.9  11.0  9.1   Hemoglobin 12.0 - 15.0 g/dL 78.2  95.6  21.3   Hematocrit 36.0 - 46.0 % 38.1  40.6  44.2   Platelets 150.0 - 400.0 K/uL 196.0  208  225     Lab Results  Component Value Date/Time   VD25OH 6.42 (L) 02/03/2017 09:14 AM   VITAMINB12 918 (H) 01/03/2014 09:53 AM    Clinical ASCVD: No  The 10-year ASCVD risk score (Arnett DK, et al., 2019) is: 21.8%   Values used to calculate the score:     Age: 43 years     Sex: Female     Is Non-Hispanic African American: No     Diabetic:  Yes     Tobacco smoker: No     Systolic Blood Pressure: 163 mmHg     Is BP treated: Yes     HDL Cholesterol: 42.1 mg/dL     Total Cholesterol: 131 mg/dL       0/07/6577   46:96 PM 11/11/2022    1:46 PM 06/16/2022    4:49 PM  Depression screen PHQ 2/9  Decreased Interest 3 1 1   Down, Depressed, Hopeless 2 1 1   PHQ - 2 Score 5 2 2   Altered sleeping 2 1 1   Tired, decreased energy 3 1 2   Change in appetite 0 1 2  Feeling bad or failure about yourself  2 1 1   Trouble concentrating 2 1 2   Moving slowly or fidgety/restless 1 0 0  Suicidal thoughts 1 0 0  PHQ-9 Score 16 7 10   Difficult doing work/chores Very difficult Somewhat difficult Somewhat difficult     Social History   Tobacco Use  Smoking Status Former   Packs/day: 1.00   Years: 35.00   Additional pack years: 0.00   Total pack years: 35.00   Types: Cigarettes   Quit date: 12/01/2008   Years since quitting: 14.3  Smokeless Tobacco Never   BP Readings from Last 3 Encounters:  01/06/23 138/70  12/05/22 118/72  11/11/22 136/70   Pulse Readings from Last 3 Encounters:  01/06/23 69  12/05/22 72  11/11/22 74   Wt Readings from Last 3 Encounters:  01/06/23 266 lb (120.7 kg)  12/05/22 265 lb (120.2 kg)  11/11/22 252 lb (114.3 kg)   BMI Readings from Last 3 Encounters:  01/06/23 51.95 kg/m  12/05/22 51.75 kg/m  11/11/22 49.22 kg/m    Allergies  Allergen Reactions   Aspirin     Mother had to be resuscitated after ingesting this   Hydrocodone-Acetaminophen Hives   Oxycodone-Acetaminophen Hives   Penicillins Hives    Did it involve swelling of the face/tongue/throat, SOB, or low BP? Yes-hives Did it involve sudden or severe rash/hives, skin peeling, or any reaction on the inside of your mouth or nose? Unk Did you need to seek medical attention at a hospital or doctor's office? Yes When did it last happen? Was in her 30's If all above answers are "NO", may proceed with cephalosporin use.  Did it involve  swelling of the face/tongue/throat, SOB, or low BP? Yes-hives Did it involve sudden or severe rash/hives, skin peeling, or any reaction on the inside of your mouth or nose? Unk Did you need to seek medical  attention at a hospital or doctor's office? Yes When did it last happen? Was in her 30's If all above answers are "NO", may proceed with cephalosporin use.   Codeine Hives   Fluoxetine Hcl Itching and Other (See Comments)    "Hair itching"   Trazodone Other (See Comments)    Couldn't walk    Medications Reviewed Today     Reviewed by Dessie Coma, PT (Physical Therapist) on 03/27/23 at 1618  Med List Status: <None>   Medication Order Taking? Sig Documenting Provider Last Dose Status Informant  albuterol (PROVENTIL HFA;VENTOLIN HFA) 108 (90 Base) MCG/ACT inhaler [id number] No Inhale 2 puffs into the lungs every 4 (four) hours as needed for wheezing or shortness of breath. Nelwyn Salisbury, MD Taking Active Self  ALPRAZolam Prudy Feeler) 0.5 MG tablet [id number] No TAKE 1 TABLET (0.5 MG TOTAL) BY MOUTH 2 (TWO) TIMES DAILY AS NEEDED.  Patient taking differently: Take 0.25-0.5 mg by mouth 2 (two) times daily as needed for anxiety or sleep.   Nelwyn Salisbury, MD Taking Active Self           Med Note Farris Has May 12, 2022 12:00 PM)    atorvastatin (LIPITOR) 80 MG tablet [id number] No Take 1 tablet by mouth once daily Nelwyn Salisbury, MD Taking Active   azelastine (ASTELIN) 0.1 % nasal spray [id number] No Place 1 spray into both nostrils 2 (two) times daily as needed for allergies. Nelwyn Salisbury, MD Taking Active   benzonatate (TESSALON) 200 MG capsule [id number] No TAKE 1 CAPSULE BY MOUTH EVERY 8 HOURS AS NEEDED FOR COUGH Nelwyn Salisbury, MD Taking Active   buPROPion ER [facility] SR) 100 MG 12 hr tablet [id number] No Take 100 mg by mouth daily. [provider] Taking Active   cetirizine (ZYRTEC) 10 MG tablet [id number] No Take 10 mg by mouth daily. [provider] Taking  Active Self  Cholecalciferol 125 MCG (5000 UT) TABS [id number] No Take 5,000 Units by mouth in the morning. [provider] Taking Active Self  Dextromethorphan-guaiFENesin [facility] COUGH & CHEST CONGEST PO) [id number] No Take 1 capsule by mouth as needed (Patient taking as needed). [provider] Taking Active Self  DULoxetine (CYMBALTA) 60 MG capsule [id number] No Take 1 capsule (60 mg total) by mouth daily.  Patient taking differently: Take 120 mg by mouth at bedtime. Take two capsules daily   Nelwyn Salisbury, MD Taking Active Self  esomeprazole (NEXIUM) 20 MG capsule [id number]  TAKE 1 CAPSULE BY MOUTH ONCE DAILY BEFORE BREAKFAST Nelwyn Salisbury, MD  Active   Fluticasone-Umeclidin-Vilant (TRELEGY ELLIPTA) 100-62.5-25 MCG/INH AEPB [id number] No Inhale 1 puff into the lungs daily. [provider] Taking Active Self  furosemide (LASIX) 40 MG tablet [id number]  Take 1 tablet by mouth once daily Nelwyn Salisbury, MD  Active   glucose 4 GM chewable tablet [id number] No Chew 1 tablet (4 g total) by mouth as needed for low blood sugar (< 70). Noralee Stain, DO Taking Active Self  hydrOXYzine (VISTARIL) 25 MG capsule [id number]  Take 1 capsule by mouth three times daily as needed Nelwyn Salisbury, MD  Active   Hyprom-Naphaz-Polysorb-Zn Sulf (CLEAR EYES COMPLETE) SOLN [id number] No Place 1-2 drops into both eyes as needed (for itching). [provider] Taking Active Self  insulin aspart (NOVOLOG) 100 UNIT/ML injection [id number] No Use sliding scale provided at time of discharge, administer 3 times daily with meals  Patient taking differently: Inject 15-48  Units into the skin See admin instructions. Inject 20-40 units subcutaneously 3 times daily with meals and inject 15-35 units subcutaneously at night if needed with nighttime snacking   Noralee Stain, DO Taking Active Self  insulin glargine, 2 Unit Dial, (TOUJEO MAX SOLOSTAR) 300 UNIT/ML Solostar Pen [id number] No Inject 80 Units  into the skin 2 (two) times daily. [provider] Taking Active Self  Insulin Pen Needle (B-D UF III MINI PEN NEEDLES) 31G X 5 MM MISC [id number] No Use to administer insulin five times a day [provider] Taking Active Self  levothyroxine (SYNTHROID) 175 MCG tablet [id number]  TAKE 1 TABLET BY MOUTH ONCE DAILY BEFORE BREAKFAST. *Labs required for future refills* Nelwyn Salisbury, MD  Active   linaclotide Karlene Einstein) 290 MCG CAPS capsule [id number]  Take 1 capsule (290 mcg total) by mouth daily before breakfast. Napoleon Form, MD  Active   methocarbamol (ROBAXIN) 500 MG tablet [id number] No Take 1 tablet (500 mg total) by mouth every 6 (six) hours as needed for muscle spasms. Nelwyn Salisbury, MD Taking Active Self  naproxen (NAPROSYN) 500 MG tablet [id number] No Take 1 tablet by mouth twice daily Nelwyn Salisbury, MD Taking Active   Nerve Stimulator (CLEVER CHOICE TENS UNIT) DEVI [id number] No Place 1 Dose onto the skin as needed (pain.). [provider] Taking Active Self  nystatin (MYCOSTATIN/NYSTOP) powder [id number]  Apply 1 Application topically 2 (two) times daily as needed (rash). Nelwyn Salisbury, MD  Active   OXYGEN [id number] No Inhale 2 L into the lungs continuous. [provider] Taking Active Self  primidone (MYSOLINE) 50 MG tablet [id number]  Take 1 tablet (50 mg total) by mouth at bedtime. Vladimir Faster, DO  Active   Probiotic Product (MISC INTESTINAL FLORA REGULAT) CAPS [id number] No Take 1 capsule by mouth daily. [provider] Taking Active Self  temazepam (RESTORIL) 15 MG capsule [id number] No Take 15 mg by mouth at bedtime. [provider] Taking Active Self  terbinafine (LAMISIL) 250 MG tablet [id number] No Take 1 tablet (250 mg total) by mouth daily. Nelwyn Salisbury, MD Taking Active   tirzepatide [facility]) 7.5 MG/0.5ML Pen [id number] No Inject 7.5 mg into the skin every Sunday. [provider] Taking Active Self             SDOH:  (Social Determinants of Health) assessments and interventions performed: Yes SDOH Interventions    Flowsheet Row Clinical Support from 04/15/2022 in [facility]  SDOH Interventions   Food Insecurity Interventions Intervention Not Indicated  Housing Interventions Intervention Not Indicated  Transportation Interventions Intervention Not Indicated  Financial Strain Interventions Intervention Not Indicated  Physical Activity Interventions Other (Comments)  [Due to COPD]  Stress Interventions Intervention Not Indicated  Social Connections Interventions Intervention Not Indicated       Medication Assistance: {MEDASSISTANCEINFO:25044}  Medication Access: Within the past 30 days, how often has patient missed a dose of medication? *** Is a pillbox or other method used to improve adherence? {YES/NO:21197} Factors that may affect medication adherence? {CHL DESC; BARRIERS:21522} Are meds synced by current pharmacy? {YES/NO:21197} Are meds delivered by current pharmacy? {YES/NO:21197} Does patient experience delays in picking up medications due to transportation concerns? {YES/NO:21197}  [facility] Services Reviewed: Is patient disadvantaged to use [facility]?: Yes  Current Rx insurance plan: HTA Name and location of Current pharmacy:  [facility] - [place], [place] - [address] [place]  [address] Phone: [phone] Fax: [phone]  [facility] - [place], [place] - [address] [place] [address] Phone: [phone] Fax: [phone]  [facility] services reviewed with patient today?: No  Patient requests to transfer care to [facility]?: No  Reason patient declined to change pharmacies: Disadvantaged due to insurance/mail order  Compliance/Adherence/Medication fill history: Care Gaps: Diabetic  Kidney eval urine - Overdue Foot Exam - Overdue Eye Exam - Overdue COVID Booster - Overdue HGB A1C - Overdue Lung Cancer Screen - Overdue AWV Overdue Hepatitis C Screen - Postponed  Star-Rating Drugs: Atorvastatin 80 mg - Last filled 03/22/23 90 DS at [facility] Verified    Assessment/Plan Hypertension (BP goal <140/90) -{US controlled/uncontrolled:25276} -Current treatment: *** -Medications previously tried: None  -Current home readings: *** -Current dietary habits: *** -Current exercise habits: *** -{ACTIONS;DENIES/REPORTS:21021675::"Denies"} hypotensive/hypertensive symptoms -Educated on {CCM BP Counseling:25124} -Counseled to monitor BP at home ***, document, and provide log at future appointments -{CCMPHARMDINTERVENTION:25122}  Hyperlipidemia: (LDL goal < 70) -{US controlled/uncontrolled:25276} -Current treatment: Atorvastatin 80mg  1 qd  -Medications previously tried: Crestor  -Current dietary patterns: *** -Current exercise habits: *** -Educated on {CCM HLD Counseling:25126} -{CCMPHARMDINTERVENTION:25122}  Diabetes (A1c goal <7%) -{US controlled/uncontrolled:25276} -Current medications: Glucose 4mg  chewable prn sugars <70 Novolog ss Mounjaro 7.5mg  once weekly?? Toujeo 80 units BID -Medications previously tried: Victoza, Januvia, Lantus  -Current home glucose readings fasting glucose: *** post prandial glucose: *** -{ACTIONS;DENIES/REPORTS:21021675::"Denies"} hypoglycemic/hyperglycemic symptoms -Current meal patterns:  breakfast: ***  lunch: ***  dinner: *** snacks: *** drinks: *** -Current exercise: *** -Educated on {CCM DM COUNSELING:25123} -Counseled to check feet daily and get yearly eye exams -{CCMPHARMDINTERVENTION:25122}  Heart Failure (Goal: manage symptoms and prevent exacerbations) -{US controlled/uncontrolled:25276} -Last ejection fraction: 60-5% (Date: 09/23/21) -HF type: HFpEF (EF > 50%) -NYHA Class: II (slight limitation of  activity) -AHA HF Stage: C (Heart disease and symptoms present) -Current treatment: Lasix 40mg  1 qd -Medications previously tried: None  -Current home BP/HR readings: *** -Current home daily weights: *** -Current dietary habits: *** -Current exercise habits: *** -Educated on {CCM HF Counseling:25125} -{CCMPHARMDINTERVENTION:25122}  COPD (Goal: control symptoms and prevent exacerbations) -{US controlled/uncontrolled:25276} -Current treatment  Albuterol HFA 1-2 puffs q 4-6h prn Azelastine nasal spray prn Zyrtec 10mg  1 qd Trelegy 1 puff daily Oxygen -Medications previously tried: ***  -Gold Grade: Gold 2 (FEV1 50-79%) -Current COPD Classification:  A (low sx, 0-1 moderate exacerbations, no hospitalizations) -Pulmonary function testing: 68.3% -Exacerbations requiring treatment in last 6 months: Anoro -Patient {Actions; denies-reports:120008} consistent use of maintenance inhaler -Frequency of rescue inhaler use: *** -Counseled on {CCMINHALERCOUNSELING:25121} -{CCMPHARMDINTERVENTION:25122}  Depression/Anxiety/Insomnia (Goal: Well-controlled mood that still allows for ADLs) -Not assessed today -Current treatment: Xanax 0.5mg  BID PRN Wellbutrin SR 100mg  1 qd Cymbalta 60mg  1 qd Hydroxyzine 25mg  1 TID prn Primidone 50mg  1 qhs Temazepm 15mg  1 qhs   GERD (Goal: minimize symptoms of reflux ) -Not assessed today -Current treatment  Nexium 20mg  1 qd before breakfast   Hypothyroidism (Goal TSH: 0.4-4.5 -Not assessed today -Current treatment: Levothyroxine 1 qd   I  Sherrill Raring Clinical Pharmacist [phone]

## 2023-04-02 ENCOUNTER — Ambulatory Visit (HOSPITAL_BASED_OUTPATIENT_CLINIC_OR_DEPARTMENT_OTHER): Payer: PPO | Attending: Family Medicine | Admitting: Physical Therapy

## 2023-04-02 ENCOUNTER — Telehealth: Payer: Self-pay

## 2023-04-02 ENCOUNTER — Encounter (HOSPITAL_BASED_OUTPATIENT_CLINIC_OR_DEPARTMENT_OTHER): Payer: Self-pay | Admitting: Physical Therapy

## 2023-04-02 DIAGNOSIS — R5381 Other malaise: Secondary | ICD-10-CM | POA: Diagnosis not present

## 2023-04-02 DIAGNOSIS — R2689 Other abnormalities of gait and mobility: Secondary | ICD-10-CM | POA: Insufficient documentation

## 2023-04-02 NOTE — Progress Notes (Signed)
Patient ID: Bonnie Gonzalez, female   DOB: Apr 19, 1956, 67 y.o.   MRN: [id number]  Care Management & Coordination Services Pharmacy Team  Reason for Encounter: Appointment Reminder  Contacted patient to confirm in office appointment with Milas Kocher, PharmD on 04/03/23 at 9:30. Spoke with patient on 04/01/23     Star Rating Drugs:  Atorvastatin 80 mg - Last filled 03/22/23 90 DS at [facility] Verified     Care Gaps: Diabetic Kidney eval urine - Overdue Foot Exam - Overdue Eye Exam - Overdue COVID Booster - Overdue HGB A1C - Overdue Lung Cancer Screen - Overdue AWV Overdue Hepatitis C Screen - Postponed   Pamala Duffel [facility] Clinical Pharmacist Assistant [phone]

## 2023-04-02 NOTE — Therapy (Signed)
OUTPATIENT PHYSICAL THERAPY LOWER EXTREMITY treatment    Patient Name: Bonnie Gonzalez MRN: [id number] DOB:Jan 06, 1956, 67 y.o., female Today's Date: 03/23/2023  END OF SESSION:  PT End of Session - 03/23/23 1349     Visit Number 14    Number of Visits 22    Date for PT Re-Evaluation 04/20/23    Authorization Type progress note done on visit 10    PT Start Time 1308    PT Stop Time 1350    PT Time Calculation (min) 42 min    Activity Tolerance Patient tolerated treatment well    Behavior During Therapy [facility] for tasks assessed/performed                Past Medical History:  Diagnosis Date   Acquired hypothyroidism 10/01/2010   Allergic rhinitis 07/20/2009   Arthritis    Back pain 10/18/2015   Bilateral leg edema 03/31/2018   Centrilobular emphysema 02/04/2019   HRCT 09/15/18   Chronic diastolic CHF (congestive heart failure) 01/28/2019   Chronic respiratory failure with hypoxia 02/04/2019   6 Min Walk 08/31/18 at [facility]   Chronic urticaria 12/21/2019   COPD (chronic obstructive pulmonary disease) 09/01/2018   Quit smoking 2010  - Spirometry 08/31/2018  FEV1 1.1 (51%)  Ratio 66 s prior rx with typical curvature  - 08/31/2018  After extensive coaching inhaler device,  effectiveness =    90% with elipta > try anoro sample > no better so d/c'  - PFT's  11/10/2018  FEV1 1.24 (59 % ) ratio 75 (ratio with fev1/VC =  63)  p no % improvement from saba p nothing prior to study with DLCO  105  % corrects to 132  %    DOE (dyspnea on exertion) 08/31/2018   Onset 05/2018 with background of unexplained leg swelling x 01/2018   -echo 04/13/18 just G1   diastolic dysfunction s PH   -  08/31/2018   Walked RA x one lap @ 185 stopped due to  Sob/ sats 89% at nl pace  - Spirometry 08/31/2018  FEV1 1.1 (51%)  Ratio 66 s prior rx  - 09/28/2018 could not do full pfts 09/28/2018  - Collagen vasc profile 09/28/18  For ESR =  54 (was 102 before prednisone)  Neg collag   Dyshidrotic eczema  12/13/2010   Essential hypertension 01/26/2019   Generalized anxiety disorder with panic attacks 01/20/2018   GERD (gastroesophageal reflux disease) 11/03/2007   Headache 11/03/2007   Irritable bowel syndrome 07/13/2009   Major depressive disorder 07/17/2020   Mixed hyperlipidemia 07/20/2009   Obesity    OSA (obstructive sleep apnea) 07/26/2015   HST 08/2015 mild OSA, AHI 8/hour, lowest desaturation 81%, desaturations noted without respiratory events  Formatting of this note might be different from the original. PSG 05/20/19 AHI 25.1   Osteopenia of multiple sites 06/01/2019   DEXA 05/24/2019: Right femur neck T- 2.2, left femur neck T- 1.5, right total femur T -0.5, left total femur T -0.5, AP total spine T- 1.9. FRAX 10-year probability of major osteoporotic fracture is 8.8% and a hip fracture is 1.2%  DEXA 05/05/2019: Right femur neck T- 1.9, left femur neck T- 2.0, right total femur T -1.3, left total femur   Oxygen deficiency    Postinflammatory pulmonary fibrosis 09/15/2018   ESR    10/161/9  = 102   > rx short term prednisone ? slt improvement in symptoms HRCT 09/15/2018  1. Very mild basilar subpleural ground-glass, reticulation and traction  bronchiolectasis, findings which may be minimally progressive from 06/29/2017 and are indicative of interstitial lung disease such as nonspecific interstitial pneumonitis. Findings are indeterminate for UIP per consensus guidelin   Psychophysiological insomnia 03/29/2019   Type 2 diabetes mellitus 02/09/2017   UTI (urinary tract infection)    Vertigo 03/29/2014   Vitamin D deficiency 09/15/2020   Past Surgical History:  Procedure Laterality Date   ANKLE FRACTURE SURGERY Right    benign tumor      removed from groin   COLONOSCOPY WITH PROPOFOL N/A 05/15/2022   Procedure: COLONOSCOPY WITH PROPOFOL;  Surgeon: Napoleon Form, MD;  Location: [facility];  Service: Gastroenterology;  Laterality: N/A;   HOT HEMOSTASIS N/A 05/15/2022   Procedure: HOT  HEMOSTASIS (ARGON PLASMA COAGULATION/BICAP);  Surgeon: Napoleon Form, MD;  Location: [facility];  Service: Gastroenterology;  Laterality: N/A;   POLYPECTOMY  05/15/2022   Procedure: POLYPECTOMY;  Surgeon: Napoleon Form, MD;  Location: [facility];  Service: Gastroenterology;;   WRIST ARTHROCENTESIS N/A 2001   Patient Active Problem List   Diagnosis Date Noted   Morbid obesity 06/16/2022   Physical deconditioning 06/16/2022   AVM (arteriovenous malformation) of colon, acquired with hemorrhage    Positive colorectal cancer screening using Cologuard test    Polyp of sigmoid colon    Polyp of transverse colon    Polyp of cecum    COVID-19 virus infection 11/21/2021   Vitamin D deficiency 09/15/2020   Major depressive disorder 07/17/2020   Chronic urticaria 12/21/2019   Osteopenia of multiple sites 06/01/2019   Psychophysiological insomnia 03/29/2019   Centrilobular emphysema 02/04/2019   Chronic respiratory failure with hypoxia 02/04/2019   Chronic diastolic CHF (congestive heart failure) 01/28/2019   Essential hypertension 01/26/2019   Postinflammatory pulmonary fibrosis 09/15/2018   COPD (chronic obstructive pulmonary disease) 09/01/2018   DOE (dyspnea on exertion) 08/31/2018   Bilateral leg edema 03/31/2018   Generalized anxiety disorder with panic attacks 01/20/2018   Type 2 diabetes mellitus 02/09/2017   Back pain 10/18/2015   OSA (obstructive sleep apnea) 07/26/2015   Vertigo 03/29/2014   Dyshidrotic eczema 12/13/2010   Acquired hypothyroidism 10/01/2010   Mixed hyperlipidemia 07/20/2009   Allergic rhinitis 07/20/2009   Irritable bowel syndrome 07/13/2009   GERD (gastroesophageal reflux disease) 11/03/2007   Headache 11/03/2007    PCP: Gershon Crane MD   REFERRING PROVIDER: Gershon Crane MD   REFERRING DIAG:  Diagnosis  J96.11 (ICD-10-CM) - Chronic respiratory failure with hypoxia (HCC)    THERAPY DIAG:  Physical deconditioning  Other abnormalities of  gait and mobility  Rationale for Evaluation and Treatment: Rehabilitation  ONSET DATE: has been on O2 for 3 years   SUBJECTIVE:   SUBJECTIVE STATEMENT: Patient reports more difficulty breathing today. She has some tests coming up   PERTINENT HISTORY: Knees, Loweback, and wrist arthritis. CHF taking lasix, post inflamitory pulmonary fibrosis; vertigo ( still has syncope at times)  PAIN:  Are you having pain? Yes: NPRS scale: 4/10 Pain location: Left knee Pain description: Aching Aggravating factors: Standing walking Relieving factors: Sitting  PRECAUTIONS: falls   WEIGHT BEARING RESTRICTIONS: No  FALLS:  Has patient fallen in last 6 months? No but has ad some near falls   LIVING ENVIRONMENT: Ramp into the house   OCCUPATION: retired  Recreation: would like to be able to get back to gardening  PLOF: Independent with household mobility with device  PATIENT GOALS: To increase endurance  NEXT MD VISIT: Nothing scheduled  OBJECTIVE:   DIAGNOSTIC FINDINGS:  Nothing of the knee   COGNITION: Overall cognitive status: Within functional limits for tasks assessed     SENSATION: Has had tinglining in her hands. Has been using wrist guards   MUSCLE LENGTH:  POSTURE: rounded shoulders, forward head, and flexed trunk    LOWER EXTREMITY ROM:  Passive ROM Right eval Left eval  Hip flexion    Hip extension    Hip abduction    Hip adduction    Hip internal rotation    Hip external rotation    Knee flexion  Painful at end range  Knee extension    Ankle dorsiflexion    Ankle plantarflexion    Ankle inversion    Ankle eversion     (Blank rows = not tested)  LOWER EXTREMITY MMT:  MMT Right eval Left eval Right 4/8 Left 4//8  Hip flexion 19.7 17.0 33.4 29  Hip extension      Hip abduction 36.9 40.3 35 40.9  Hip adduction      Hip internal rotation      Hip external rotation      Knee flexion      Knee extension 20 18.7 45 35  Ankle dorsiflexion       Ankle plantarflexion      Ankle inversion      Ankle eversion       (Blank rows = not tested)  LOWER EXTREMITY SPECIAL TESTS:   IE: 5x sit to stand: 24 sec   Hr 85  Sao2 96   01/26/23: walk test: 262ft total- seated rest break at 178ft and paused timer. 4/15: 5xSTS-21sec  GAIT:  TODAY'S TREATMENT:                                                                                                                               DATE:  5/2 Nustep x48mins L2 95 O2 at entry  Bicep curls 3x10 3lbs Chest Press 3x10 3lbs Overhead press 3x10 3lbs  green band horizontal abduction 2x10 Green band er 2x10 Hamstring curls green band 2x10 Standing heel raises 2x10   Sao2 remained > 88 with frequent monitoring 4/26 Nustep x87mins L2 95 O2 at entry  Bicep curls 3x10 3lbs Chest Press 3x10 3lbs 1 full lap with break in between  green band horizontal abduction 2x10 Green band er 2x10 Hamstring curls green band 2x10 Standing heel raises 2x10    4/22 NU step x5 mins L3 Gait 152ft x4 with seated rest breaks and O2 monitoring Bicep curls 3x10 3lbs Chest Press 3x10 3lbs Overhead shoulder press 3x10 3lbs Hip abduction green band 3x10 Seated bil ER 2x10 red band   4/19 NU step x43mins L2 (90% at L3), x86mins L3 (94% at L3) X59mins L4 (96% at L3) Bicep curls 3x10 3lbs Chest Press 3x10 3lbs Overhead shoulder press 3x10 3lbs Hip abduction green band 3x10 Hamstring curls red band 2x10 Seated ER 2x10 red band Standing heel raise 2x10   4/15  Nu-step L4 x2mins 2X150 ft (97% at 3L), 100 ft (99%). Did drop to 89% when drinking water after first round of gait training.  Biceps curls 3x10 2lbs Chest press 3x10 2lbs  Vitals    4/12 Nu-step L4 x12mins 150 ft, rest break then another 100 ft of walking Biceps curls 3x10 2lbs Chest press 3x10 2lbs Overhead shoulder press 3x10 2lbs  Yellow band horizontal abduction 3x10 Heel raise 3x10 March x10ea Shuttle 50 lbs 3x15    4/9 Nu-step L5 x23mins 150 ft, rest break then another 100 ft of walking Biceps curls 3x10 2lbs Chest press 3x10 2lbs Overhead shoulder press 3x10 2lbs  Yellow band flexion 3x10 Yellow band horizontal abduction 3x10 Yellow band ER 3x10 Heel raise 3x10   Performed progress note of patient, review goals and plan of care   PATIENT EDUCATION:  Education details: HEP, symptom management, activity progression Person educated: Patient Education method: Explanation, Demonstration, Tactile cues, and Verbal cues Education comprehension: verbalized understanding, returned demonstration, verbal cues required, tactile cues required, and needs further education  HOME EXERCISE PROGRAM: Access Code: [email or web address] URL: [email or web address] Date: 01/19/2023 Prepared by: Riki Altes  Exercises - Heel Raises with Counter Support  - 1-2 x daily - 7 x weekly - 2 sets - 10 reps - Standing March with Counter Support  - 1-2 x daily - 7 x weekly - 2 sets - 10 reps - Standing Hip Abduction with Counter Support  - 1-2 x daily - 7 x weekly - 2 sets - 10 reps - Standing Hip Extension with Counter Support  - 1-2 x daily - 7 x weekly - 2 sets - 10 reps - Mini Squat with Counter Support  - 1-2 x daily - 7 x weekly - 2 sets - 10 reps - Standing Knee Flexion with Counter Support  - 1-2 x daily - 7 x weekly - 2 sets - 10 reps  ASSESSMENT:  CLINICAL IMPRESSION: The patient had increased SOB with activity today, but she was able to do all of her exercises. She also performed a trial of the shuttle press. She tolerated well. We held on walking today 2nd to shortness of breath. She will see her knee doctor tomorrow.  OBJECTIVE IMPAIRMENTS: Abnormal gait, cardiopulmonary status limiting activity, decreased activity tolerance, decreased balance, decreased endurance, decreased mobility, difficulty walking, decreased ROM, decreased strength, and pain.   ACTIVITY LIMITATIONS: carrying, lifting, bending,  standing, squatting, stairs, transfers, bathing, dressing, and locomotion level  PARTICIPATION LIMITATIONS: meal prep, cleaning, laundry, driving, shopping, community activity, and yard work  PERSONAL FACTORS: 3+ comorbidities: Knees, Loweback, and wrist arthritis. CHF taking lasix, post inflamitory pulmonary fibrosis; vertigo ( still has syncope at times) are also affecting patient's functional outcome.   REHAB POTENTIAL: Good  CLINICAL DECISION MAKING: Evolving/moderate complexity declining overall mobility   EVALUATION COMPLEXITY: Moderate   GOALS: Goals reviewed with patient? Yes  SHORT TERM GOALS: Target date: 02/13/2023   Therapy will perform 6-minute walk test with patient and come up with baseline goals Baseline: Goal status: MET 01/26/23  2.  Patient will decrease sit to stand time time by 5 seconds Baseline:  Goal status: In Progress (4/15)  3.  Patient will perform 10 minutes of interval training on NuStep while maintaining a heart rate less than 110 Baseline:  Goal status: Met 4/8  LONG TERM GOALS: Target date: 03/13/2023    Patient will have a complete exercise program that she can take to Silver sneakers and continue long-term Baseline:  Goal status: progressing  towards program  2.  Patient will ambulate 600 feet without self-report of dyspnea in order to attend appointments without being short of breath Baseline:  Goal status:  not achieved, in progress 4/9  3.  Patient will stand for greater than 20 minutes without increased pain in left knee and increased dyspnea in order to perform ADLs Baseline:  Goal status: In progress 4/9    PLAN:  PT FREQUENCY: 2x/week  PT DURATION: 8 weeks  PLANNED INTERVENTIONS: Therapeutic exercises, Therapeutic activity, Neuromuscular re-education, Balance training, Gait training, Patient/Family education, Self Care, Joint mobilization, Stair training, DME instructions, Aquatic Therapy, Dry Needling, Electrical stimulation,  Spinal mobilization, Cryotherapy, Moist heat, Ultrasound, and Manual therapy  PLAN FOR NEXT SESSION: Continue to progress patient HEP. Consider 2 laps of gait training. Consider standing exercise; mini squats and standing marches on foam pad to work on balance. Consider forward step ups with 2in.   During this treatment session, the therapist was present, participating in and directing the treatment.   Lorayne Bender PT DPT  03/23/2023, 2:37 PM   t.

## 2023-04-03 DIAGNOSIS — M170 Bilateral primary osteoarthritis of knee: Secondary | ICD-10-CM | POA: Diagnosis not present

## 2023-04-06 DIAGNOSIS — J439 Emphysema, unspecified: Secondary | ICD-10-CM | POA: Diagnosis not present

## 2023-04-06 DIAGNOSIS — J449 Chronic obstructive pulmonary disease, unspecified: Secondary | ICD-10-CM | POA: Diagnosis not present

## 2023-04-06 DIAGNOSIS — J432 Centrilobular emphysema: Secondary | ICD-10-CM | POA: Diagnosis not present

## 2023-04-06 DIAGNOSIS — J9611 Chronic respiratory failure with hypoxia: Secondary | ICD-10-CM | POA: Diagnosis not present

## 2023-04-06 DIAGNOSIS — R918 Other nonspecific abnormal finding of lung field: Secondary | ICD-10-CM | POA: Diagnosis not present

## 2023-04-06 DIAGNOSIS — J8410 Pulmonary fibrosis, unspecified: Secondary | ICD-10-CM | POA: Diagnosis not present

## 2023-04-09 ENCOUNTER — Ambulatory Visit (HOSPITAL_BASED_OUTPATIENT_CLINIC_OR_DEPARTMENT_OTHER): Payer: PPO | Admitting: Physical Therapy

## 2023-04-09 ENCOUNTER — Encounter (HOSPITAL_BASED_OUTPATIENT_CLINIC_OR_DEPARTMENT_OTHER): Payer: Self-pay | Admitting: Physical Therapy

## 2023-04-09 DIAGNOSIS — R2689 Other abnormalities of gait and mobility: Secondary | ICD-10-CM

## 2023-04-09 DIAGNOSIS — R5381 Other malaise: Secondary | ICD-10-CM

## 2023-04-09 NOTE — Therapy (Signed)
OUTPATIENT PHYSICAL THERAPY LOWER EXTREMITY treatment    Patient Name: Bonnie Gonzalez MRN: [id number] DOB:Jan 06, 1956, 67 y.o., female Today's Date: 03/23/2023  END OF SESSION:  PT End of Session - 03/23/23 1349     Visit Number 14    Number of Visits 22    Date for PT Re-Evaluation 04/20/23    Authorization Type progress note done on visit 10    PT Start Time 1308    PT Stop Time 1350    PT Time Calculation (min) 42 min    Activity Tolerance Patient tolerated treatment well    Behavior During Therapy [facility] for tasks assessed/performed                Past Medical History:  Diagnosis Date   Acquired hypothyroidism 10/01/2010   Allergic rhinitis 07/20/2009   Arthritis    Back pain 10/18/2015   Bilateral leg edema 03/31/2018   Centrilobular emphysema 02/04/2019   HRCT 09/15/18   Chronic diastolic CHF (congestive heart failure) 01/28/2019   Chronic respiratory failure with hypoxia 02/04/2019   6 Min Walk 08/31/18 at [facility]   Chronic urticaria 12/21/2019   COPD (chronic obstructive pulmonary disease) 09/01/2018   Quit smoking 2010  - Spirometry 08/31/2018  FEV1 1.1 (51%)  Ratio 66 s prior rx with typical curvature  - 08/31/2018  After extensive coaching inhaler device,  effectiveness =    90% with elipta > try anoro sample > no better so d/c'  - PFT's  11/10/2018  FEV1 1.24 (59 % ) ratio 75 (ratio with fev1/VC =  63)  p no % improvement from saba p nothing prior to study with DLCO  105  % corrects to 132  %    DOE (dyspnea on exertion) 08/31/2018   Onset 05/2018 with background of unexplained leg swelling x 01/2018   -echo 04/13/18 just G1   diastolic dysfunction s PH   -  08/31/2018   Walked RA x one lap @ 185 stopped due to  Sob/ sats 89% at nl pace  - Spirometry 08/31/2018  FEV1 1.1 (51%)  Ratio 66 s prior rx  - 09/28/2018 could not do full pfts 09/28/2018  - Collagen vasc profile 09/28/18  For ESR =  54 (was 102 before prednisone)  Neg collag   Dyshidrotic eczema  12/13/2010   Essential hypertension 01/26/2019   Generalized anxiety disorder with panic attacks 01/20/2018   GERD (gastroesophageal reflux disease) 11/03/2007   Headache 11/03/2007   Irritable bowel syndrome 07/13/2009   Major depressive disorder 07/17/2020   Mixed hyperlipidemia 07/20/2009   Obesity    OSA (obstructive sleep apnea) 07/26/2015   HST 08/2015 mild OSA, AHI 8/hour, lowest desaturation 81%, desaturations noted without respiratory events  Formatting of this note might be different from the original. PSG 05/20/19 AHI 25.1   Osteopenia of multiple sites 06/01/2019   DEXA 05/24/2019: Right femur neck T- 2.2, left femur neck T- 1.5, right total femur T -0.5, left total femur T -0.5, AP total spine T- 1.9. FRAX 10-year probability of major osteoporotic fracture is 8.8% and a hip fracture is 1.2%  DEXA 05/05/2019: Right femur neck T- 1.9, left femur neck T- 2.0, right total femur T -1.3, left total femur   Oxygen deficiency    Postinflammatory pulmonary fibrosis 09/15/2018   ESR    10/161/9  = 102   > rx short term prednisone ? slt improvement in symptoms HRCT 09/15/2018  1. Very mild basilar subpleural ground-glass, reticulation and traction  bronchiolectasis, findings which may be minimally progressive from 06/29/2017 and are indicative of interstitial lung disease such as nonspecific interstitial pneumonitis. Findings are indeterminate for UIP per consensus guidelin   Psychophysiological insomnia 03/29/2019   Type 2 diabetes mellitus 02/09/2017   UTI (urinary tract infection)    Vertigo 03/29/2014   Vitamin D deficiency 09/15/2020   Past Surgical History:  Procedure Laterality Date   ANKLE FRACTURE SURGERY Right    benign tumor      removed from groin   COLONOSCOPY WITH PROPOFOL N/A 05/15/2022   Procedure: COLONOSCOPY WITH PROPOFOL;  Surgeon: Napoleon Form, MD;  Location: [facility];  Service: Gastroenterology;  Laterality: N/A;   HOT HEMOSTASIS N/A 05/15/2022   Procedure: HOT  HEMOSTASIS (ARGON PLASMA COAGULATION/BICAP);  Surgeon: Napoleon Form, MD;  Location: [facility];  Service: Gastroenterology;  Laterality: N/A;   POLYPECTOMY  05/15/2022   Procedure: POLYPECTOMY;  Surgeon: Napoleon Form, MD;  Location: [facility];  Service: Gastroenterology;;   WRIST ARTHROCENTESIS N/A 2001   Patient Active Problem List   Diagnosis Date Noted   Morbid obesity 06/16/2022   Physical deconditioning 06/16/2022   AVM (arteriovenous malformation) of colon, acquired with hemorrhage    Positive colorectal cancer screening using Cologuard test    Polyp of sigmoid colon    Polyp of transverse colon    Polyp of cecum    COVID-19 virus infection 11/21/2021   Vitamin D deficiency 09/15/2020   Major depressive disorder 07/17/2020   Chronic urticaria 12/21/2019   Osteopenia of multiple sites 06/01/2019   Psychophysiological insomnia 03/29/2019   Centrilobular emphysema 02/04/2019   Chronic respiratory failure with hypoxia 02/04/2019   Chronic diastolic CHF (congestive heart failure) 01/28/2019   Essential hypertension 01/26/2019   Postinflammatory pulmonary fibrosis 09/15/2018   COPD (chronic obstructive pulmonary disease) 09/01/2018   DOE (dyspnea on exertion) 08/31/2018   Bilateral leg edema 03/31/2018   Generalized anxiety disorder with panic attacks 01/20/2018   Type 2 diabetes mellitus 02/09/2017   Back pain 10/18/2015   OSA (obstructive sleep apnea) 07/26/2015   Vertigo 03/29/2014   Dyshidrotic eczema 12/13/2010   Acquired hypothyroidism 10/01/2010   Mixed hyperlipidemia 07/20/2009   Allergic rhinitis 07/20/2009   Irritable bowel syndrome 07/13/2009   GERD (gastroesophageal reflux disease) 11/03/2007   Headache 11/03/2007    PCP: Gershon Crane MD   REFERRING PROVIDER: Gershon Crane MD   REFERRING DIAG:  Diagnosis  J96.11 (ICD-10-CM) - Chronic respiratory failure with hypoxia (HCC)    THERAPY DIAG:  Physical deconditioning  Other abnormalities of  gait and mobility  Rationale for Evaluation and Treatment: Rehabilitation  ONSET DATE: has been on O2 for 3 years   SUBJECTIVE:   SUBJECTIVE STATEMENT: Patient reports more difficulty breathing today. She has some tests coming up   PERTINENT HISTORY: Knees, Loweback, and wrist arthritis. CHF taking lasix, post inflamitory pulmonary fibrosis; vertigo ( still has syncope at times)  PAIN:  Are you having pain? Yes: NPRS scale: 4/10 Pain location: Left knee Pain description: Aching Aggravating factors: Standing walking Relieving factors: Sitting  PRECAUTIONS: falls   WEIGHT BEARING RESTRICTIONS: No  FALLS:  Has patient fallen in last 6 months? No but has ad some near falls   LIVING ENVIRONMENT: Ramp into the house   OCCUPATION: retired  Recreation: would like to be able to get back to gardening  PLOF: Independent with household mobility with device  PATIENT GOALS: To increase endurance  NEXT MD VISIT: Nothing scheduled  OBJECTIVE:   DIAGNOSTIC FINDINGS:  Nothing of the knee   COGNITION: Overall cognitive status: Within functional limits for tasks assessed     SENSATION: Has had tinglining in her hands. Has been using wrist guards   MUSCLE LENGTH:  POSTURE: rounded shoulders, forward head, and flexed trunk    LOWER EXTREMITY ROM:  Passive ROM Right eval Left eval  Hip flexion    Hip extension    Hip abduction    Hip adduction    Hip internal rotation    Hip external rotation    Knee flexion  Painful at end range  Knee extension    Ankle dorsiflexion    Ankle plantarflexion    Ankle inversion    Ankle eversion     (Blank rows = not tested)  LOWER EXTREMITY MMT:  MMT Right eval Left eval Right 4/8 Left 4//8  Hip flexion 19.7 17.0 33.4 29  Hip extension      Hip abduction 36.9 40.3 35 40.9  Hip adduction      Hip internal rotation      Hip external rotation      Knee flexion      Knee extension 20 18.7 45 35  Ankle dorsiflexion       Ankle plantarflexion      Ankle inversion      Ankle eversion       (Blank rows = not tested)  LOWER EXTREMITY SPECIAL TESTS:   IE: 5x sit to stand: 24 sec   Hr 85  Sao2 96   01/26/23: walk test: 272ft total- seated rest break at 12ft and paused timer. 4/15: 5xSTS-21sec  GAIT:  TODAY'S TREATMENT:                                                                                                                               DATE:  Nustep 2x3 mins L2 90 O2 at entry after first set at 95 moderate dyspnea notes  Shuttle press 50 first set 2x10 at 56 lbs  LF row machine x15 1st set 10 2x15 at 15 lbs  Hip abduction 1st set at 40 x15 2x15 at 55   Sao2 monitored throughout the seesion. Frequently fluctuated between 88% and 98%   Gait: Ambulation 150' with seated rest break of 2 min Sao2 >88%    5/2 Nustep x43mins L2 95 O2 at entry  Bicep curls 3x10 3lbs Chest Press 3x10 3lbs Overhead press 3x10 3lbs  green band horizontal abduction 2x10 Green band er 2x10 Hamstring curls green band 2x10 Standing heel raises 2x10   Sao2 remained > 88 with frequent monitoring 4/26 Nustep x68mins L2 95 O2 at entry  Bicep curls 3x10 3lbs Chest Press 3x10 3lbs 1 full lap with break in between  green band horizontal abduction 2x10 Green band er 2x10 Hamstring curls green band 2x10 Standing heel raises 2x10    4/22 NU step x5 mins L3 Gait 128ft x4 with seated rest breaks and O2 monitoring  Bicep curls 3x10 3lbs Chest Press 3x10 3lbs Overhead shoulder press 3x10 3lbs Hip abduction green band 3x10 Seated bil ER 2x10 red band   4/19 NU step x35mins L2 (90% at L3), x33mins L3 (94% at L3) X34mins L4 (96% at L3) Bicep curls 3x10 3lbs Chest Press 3x10 3lbs Overhead shoulder press 3x10 3lbs Hip abduction green band 3x10 Hamstring curls red band 2x10 Seated ER 2x10 red band Standing heel raise 2x10   4/15 Nu-step L4 x55mins 2X150 ft (97% at 3L), 100 ft (99%). Did drop to 89% when  drinking water after first round of gait training.  Biceps curls 3x10 2lbs Chest press 3x10 2lbs  Vitals    4/12 Nu-step L4 x41mins 150 ft, rest break then another 100 ft of walking Biceps curls 3x10 2lbs Chest press 3x10 2lbs Overhead shoulder press 3x10 2lbs  Yellow band horizontal abduction 3x10 Heel raise 3x10 March x10ea Shuttle 50 lbs 3x15   4/9 Nu-step L5 x21mins 150 ft, rest break then another 100 ft of walking Biceps curls 3x10 2lbs Chest press 3x10 2lbs Overhead shoulder press 3x10 2lbs  Yellow band flexion 3x10 Yellow band horizontal abduction 3x10 Yellow band ER 3x10 Heel raise 3x10   Performed progress note of patient, review goals and plan of care   PATIENT EDUCATION:  Education details: HEP, symptom management, activity progression Person educated: Patient Education method: Explanation, Demonstration, Tactile cues, and Verbal cues Education comprehension: verbalized understanding, returned demonstration, verbal cues required, tactile cues required, and needs further education  HOME EXERCISE PROGRAM: Access Code: [email or web address] URL: [email or web address] Date: 01/19/2023 Prepared by: Riki Altes  Exercises - Heel Raises with Counter Support  - 1-2 x daily - 7 x weekly - 2 sets - 10 reps - Standing March with Counter Support  - 1-2 x daily - 7 x weekly - 2 sets - 10 reps - Standing Hip Abduction with Counter Support  - 1-2 x daily - 7 x weekly - 2 sets - 10 reps - Standing Hip Extension with Counter Support  - 1-2 x daily - 7 x weekly - 2 sets - 10 reps - Mini Squat with Counter Support  - 1-2 x daily - 7 x weekly - 2 sets - 10 reps - Standing Knee Flexion with Counter Support  - 1-2 x daily - 7 x weekly - 2 sets - 10 reps  ASSESSMENT:  CLINICAL IMPRESSION: We continue to expand patients gym exercises. She did very well today. We added in the row machine and the hip abduction machine. She reported fatigue but was able to complete the  exercises. We also added the walking back in. Therapy will re-assess next weekend and extended POC depending on her progress.   OBJECTIVE IMPAIRMENTS: Abnormal gait, cardiopulmonary status limiting activity, decreased activity tolerance, decreased balance, decreased endurance, decreased mobility, difficulty walking, decreased ROM, decreased strength, and pain.   ACTIVITY LIMITATIONS: carrying, lifting, bending, standing, squatting, stairs, transfers, bathing, dressing, and locomotion level  PARTICIPATION LIMITATIONS: meal prep, cleaning, laundry, driving, shopping, community activity, and yard work  PERSONAL FACTORS: 3+ comorbidities: Knees, Loweback, and wrist arthritis. CHF taking lasix, post inflamitory pulmonary fibrosis; vertigo ( still has syncope at times) are also affecting patient's functional outcome.   REHAB POTENTIAL: Good  CLINICAL DECISION MAKING: Evolving/moderate complexity declining overall mobility   EVALUATION COMPLEXITY: Moderate   GOALS: Goals reviewed with patient? Yes  SHORT TERM GOALS: Target date: 02/13/2023   Therapy will perform 6-minute walk test with patient and come up with baseline goals  Baseline: Goal status: MET 01/26/23  2.  Patient will decrease sit to stand time time by 5 seconds Baseline:  Goal status: In Progress (4/15)  3.  Patient will perform 10 minutes of interval training on NuStep while maintaining a heart rate less than 110 Baseline:  Goal status: Met 4/8  LONG TERM GOALS: Target date: 03/13/2023    Patient will have a complete exercise program that she can take to [employer] sne[employer] and continue long-term Baseline:  Goal status: progressing towards program  2.  Patient will ambulate 600 feet without self-report of dyspnea in order to attend appointments without being short of breath Baseline:  Goal status:  not achieved, in progress 4/9  3.  Patient will stand for greater than 20 minutes without increased pain in left knee and  increased dyspnea in order to perform ADLs Baseline:  Goal status: In progress 4/9    PLAN:  PT FREQUENCY: 2x/week  PT DURATION: 8 weeks  PLANNED INTERVENTIONS: Therapeutic exercises, Therapeutic activity, Neuromuscular re-education, Balance training, Gait training, Patient/Family education, Self Care, Joint mobilization, Stair training, DME instructions, Aquatic Therapy, Dry Needling, Electrical stimulation, Spinal mobilization, Cryotherapy, Moist heat, Ultrasound, and Manual therapy  PLAN FOR NEXT SESSION: Continue to progress patient HEP. Consider 2 laps of gait training. Consider standing exercise; mini squats and standing marches on foam pad to work on balance. Consider forward step ups with 2in.   Lorayne Bender PT DPT  03/23/2023, 2:37 PM   t.

## 2023-04-10 DIAGNOSIS — J449 Chronic obstructive pulmonary disease, unspecified: Secondary | ICD-10-CM | POA: Diagnosis not present

## 2023-04-10 DIAGNOSIS — K449 Diaphragmatic hernia without obstruction or gangrene: Secondary | ICD-10-CM | POA: Insufficient documentation

## 2023-04-10 DIAGNOSIS — I5032 Chronic diastolic (congestive) heart failure: Secondary | ICD-10-CM | POA: Diagnosis not present

## 2023-04-11 ENCOUNTER — Ambulatory Visit (HOSPITAL_BASED_OUTPATIENT_CLINIC_OR_DEPARTMENT_OTHER): Payer: PPO

## 2023-04-11 ENCOUNTER — Encounter (HOSPITAL_BASED_OUTPATIENT_CLINIC_OR_DEPARTMENT_OTHER): Payer: Self-pay

## 2023-04-11 ENCOUNTER — Encounter (HOSPITAL_BASED_OUTPATIENT_CLINIC_OR_DEPARTMENT_OTHER): Payer: PPO

## 2023-04-11 DIAGNOSIS — R2689 Other abnormalities of gait and mobility: Secondary | ICD-10-CM

## 2023-04-11 DIAGNOSIS — R5381 Other malaise: Secondary | ICD-10-CM | POA: Diagnosis not present

## 2023-04-11 NOTE — Therapy (Signed)
OUTPATIENT PHYSICAL THERAPY LOWER EXTREMITY treatment    Patient Name: Bonnie Gonzalez MRN: [id number] DOB:January 13, 1956, 67 y.o., female Today's Date: 04/11/2023  END OF SESSION:  PT End of Session - 04/11/23 0910     Visit Number 18    Number of Visits 22    Date for PT Re-Evaluation 04/20/23    Authorization Type progress note done on visit 10    PT Start Time 0838    PT Stop Time 0923    PT Time Calculation (min) 45 min    Activity Tolerance Patient tolerated treatment well    Behavior During Therapy [facility] for tasks assessed/performed                 Past Medical History:  Diagnosis Date   Acquired hypothyroidism 10/01/2010   Allergic rhinitis 07/20/2009   Arthritis    Back pain 10/18/2015   Bilateral leg edema 03/31/2018   Centrilobular emphysema 02/04/2019   HRCT 09/15/18   Chronic diastolic CHF (congestive heart failure) 01/28/2019   Chronic respiratory failure with hypoxia 02/04/2019   6 Min Walk 08/31/18 at [facility]   Chronic urticaria 12/21/2019   COPD (chronic obstructive pulmonary disease) 09/01/2018   Quit smoking 2010  - Spirometry 08/31/2018  FEV1 1.1 (51%)  Ratio 66 s prior rx with typical curvature  - 08/31/2018  After extensive coaching inhaler device,  effectiveness =    90% with elipta > try anoro sample > no better so d/c'  - PFT's  11/10/2018  FEV1 1.24 (59 % ) ratio 75 (ratio with fev1/VC =  63)  p no % improvement from saba p nothing prior to study with DLCO  105  % corrects to 132  %    DOE (dyspnea on exertion) 08/31/2018   Onset 05/2018 with background of unexplained leg swelling x 01/2018   -echo 04/13/18 just G1   diastolic dysfunction s PH   -  08/31/2018   Walked RA x one lap @ 185 stopped due to  Sob/ sats 89% at nl pace  - Spirometry 08/31/2018  FEV1 1.1 (51%)  Ratio 66 s prior rx  - 09/28/2018 could not do full pfts 09/28/2018  - Collagen vasc profile 09/28/18  For ESR =  54 (was 102 before prednisone)  Neg collag   Dyshidrotic eczema  12/13/2010   Essential hypertension 01/26/2019   Generalized anxiety disorder with panic attacks 01/20/2018   GERD (gastroesophageal reflux disease) 11/03/2007   Headache 11/03/2007   Irritable bowel syndrome 07/13/2009   Major depressive disorder 07/17/2020   Mixed hyperlipidemia 07/20/2009   Obesity    OSA (obstructive sleep apnea) 07/26/2015   HST 08/2015 mild OSA, AHI 8/hour, lowest desaturation 81%, desaturations noted without respiratory events  Formatting of this note might be different from the original. PSG 05/20/19 AHI 25.1   Osteopenia of multiple sites 06/01/2019   DEXA 05/24/2019: Right femur neck T- 2.2, left femur neck T- 1.5, right total femur T -0.5, left total femur T -0.5, AP total spine T- 1.9. FRAX 10-year probability of major osteoporotic fracture is 8.8% and a hip fracture is 1.2%  DEXA 05/05/2019: Right femur neck T- 1.9, left femur neck T- 2.0, right total femur T -1.3, left total femur   Oxygen deficiency    Postinflammatory pulmonary fibrosis 09/15/2018   ESR    10/161/9  = 102   > rx short term prednisone ? slt improvement in symptoms HRCT 09/15/2018  1. Very mild basilar subpleural ground-glass, reticulation and  traction bronchiolectasis, findings which may be minimally progressive from 06/29/2017 and are indicative of interstitial lung disease such as nonspecific interstitial pneumonitis. Findings are indeterminate for UIP per consensus guidelin   Psychophysiological insomnia 03/29/2019   Type 2 diabetes mellitus 02/09/2017   UTI (urinary tract infection)    Vertigo 03/29/2014   Vitamin D deficiency 09/15/2020   Past Surgical History:  Procedure Laterality Date   ANKLE FRACTURE SURGERY Right    benign tumor      removed from groin   COLONOSCOPY WITH PROPOFOL N/A 05/15/2022   Procedure: COLONOSCOPY WITH PROPOFOL;  Surgeon: Napoleon Form, MD;  Location: [facility];  Service: Gastroenterology;  Laterality: N/A;   HOT HEMOSTASIS N/A 05/15/2022   Procedure: HOT  HEMOSTASIS (ARGON PLASMA COAGULATION/BICAP);  Surgeon: Napoleon Form, MD;  Location: [facility];  Service: Gastroenterology;  Laterality: N/A;   POLYPECTOMY  05/15/2022   Procedure: POLYPECTOMY;  Surgeon: Napoleon Form, MD;  Location: [facility];  Service: Gastroenterology;;   WRIST ARTHROCENTESIS N/A 2001   Patient Active Problem List   Diagnosis Date Noted   Morbid obesity (HCC) 06/16/2022   Physical deconditioning 06/16/2022   AVM (arteriovenous malformation) of colon, acquired with hemorrhage    Positive colorectal cancer screening using Cologuard test    Polyp of sigmoid colon    Polyp of transverse colon    Polyp of cecum    COVID-19 virus infection 11/21/2021   Vitamin D deficiency 09/15/2020   Major depressive disorder 07/17/2020   Chronic urticaria 12/21/2019   Osteopenia of multiple sites 06/01/2019   Psychophysiological insomnia 03/29/2019   Centrilobular emphysema 02/04/2019   Chronic respiratory failure with hypoxia 02/04/2019   Chronic diastolic CHF (congestive heart failure) 01/28/2019   Essential hypertension 01/26/2019   Postinflammatory pulmonary fibrosis 09/15/2018   COPD (chronic obstructive pulmonary disease) 09/01/2018   DOE (dyspnea on exertion) 08/31/2018   Bilateral leg edema 03/31/2018   Generalized anxiety disorder with panic attacks 01/20/2018   Type 2 diabetes mellitus 02/09/2017   Back pain 10/18/2015   OSA (obstructive sleep apnea) 07/26/2015   Vertigo 03/29/2014   Dyshidrotic eczema 12/13/2010   Acquired hypothyroidism 10/01/2010   Mixed hyperlipidemia 07/20/2009   Allergic rhinitis 07/20/2009   Irritable bowel syndrome 07/13/2009   GERD (gastroesophageal reflux disease) 11/03/2007   Headache 11/03/2007    PCP: Gershon Crane MD   REFERRING PROVIDER: Gershon Crane MD   REFERRING DIAG:  Diagnosis  J96.11 (ICD-10-CM) - Chronic respiratory failure with hypoxia (HCC)    THERAPY DIAG:  Physical deconditioning  Other  abnormalities of gait and mobility  Rationale for Evaluation and Treatment: Rehabilitation  ONSET DATE: has been on O2 for 3 years   SUBJECTIVE:   SUBJECTIVE STATEMENT: Patient reports no soreness at after last session. "I did some good stretches." End of [place] is when her breathing test is scheduled.  PERTINENT HISTORY: Knees, Loweback, and wrist arthritis. CHF taking lasix, post inflamitory pulmonary fibrosis; vertigo ( still has syncope at times)  PAIN:  Are you having pain? Yes: NPRS scale: 4/10 Pain location: Left knee Pain description: Aching Aggravating factors: Standing walking Relieving factors: Sitting  PRECAUTIONS: falls   WEIGHT BEARING RESTRICTIONS: No  FALLS:  Has patient fallen in last 6 months? No but has ad some near falls   LIVING ENVIRONMENT: Ramp into the house   OCCUPATION: retired  Recreation: would like to be able to get back to gardening  PLOF: Independent with household mobility with device  PATIENT GOALS: To increase endurance  NEXT MD VISIT: Nothing scheduled  OBJECTIVE:   DIAGNOSTIC FINDINGS: Nothing of the knee   COGNITION: Overall cognitive status: Within functional limits for tasks assessed     SENSATION: Has had tinglining in her hands. Has been using wrist guards   MUSCLE LENGTH:  POSTURE: rounded shoulders, forward head, and flexed trunk    LOWER EXTREMITY ROM:  Passive ROM Right eval Left eval  Hip flexion    Hip extension    Hip abduction    Hip adduction    Hip internal rotation    Hip external rotation    Knee flexion  Painful at end range  Knee extension    Ankle dorsiflexion    Ankle plantarflexion    Ankle inversion    Ankle eversion     (Blank rows = not tested)  LOWER EXTREMITY MMT:  MMT Right eval Left eval Right 4/8 Left 4//8  Hip flexion 19.7 17.0 33.4 29  Hip extension      Hip abduction 36.9 40.3 35 40.9  Hip adduction      Hip internal rotation      Hip external rotation      Knee  flexion      Knee extension 20 18.7 45 35  Ankle dorsiflexion      Ankle plantarflexion      Ankle inversion      Ankle eversion       (Blank rows = not tested)  LOWER EXTREMITY SPECIAL TESTS:   IE: 5x sit to stand: 24 sec   Hr 85  Sao2 96   01/26/23: walk test: 265ft total- seated rest break at 190ft and paused timer. 4/15: 5xSTS-21sec  GAIT:  TODAY'S TREATMENT:                                                                                                                               DATE:  Nustep x4 mins L3 UE and LE 90 O2 at entry after first set at 95 moderate dyspnea notes  Shuttle press 56-68# x15, 68#x15 (O2 dropped to 85% afterwards) LF row machine 2x15 20# Hip abduction 3x10 55# LF Triceps press down 40# 2x15  Sao2 monitored throughout the session. Frequently fluctuated between 85% and 96%   Gait: Ambulation 2x150' with seated rest break of 5 min Sao2 dropped to 83%, rose back to 96%     5/2 Nustep x90mins L2 95 O2 at entry  Bicep curls 3x10 3lbs Chest Press 3x10 3lbs Overhead press 3x10 3lbs  green band horizontal abduction 2x10 Green band er 2x10 Hamstring curls green band 2x10 Standing heel raises 2x10   Sao2 remained > 88 with frequent monitoring 4/26 Nustep x44mins L2 95 O2 at entry  Bicep curls 3x10 3lbs Chest Press 3x10 3lbs 1 full lap with break in between  green band horizontal abduction 2x10 Green band er 2x10 Hamstring curls green band 2x10 Standing heel raises 2x10    4/22 NU step  x5 mins L3 Gait 166ft x4 with seated rest breaks and O2 monitoring Bicep curls 3x10 3lbs Chest Press 3x10 3lbs Overhead shoulder press 3x10 3lbs Hip abduction green band 3x10 Seated bil ER 2x10 red band  PATIENT EDUCATION:  Education details: HEP, symptom management, activity progression Person educated: Patient Education method: Explanation, Demonstration, Tactile cues, and Verbal cues Education comprehension: verbalized understanding, returned  demonstration, verbal cues required, tactile cues required, and needs further education  HOME EXERCISE PROGRAM: Access Code: [email or web address] URL: [email or web address] Date: 01/19/2023 Prepared by: Riki Altes  Exercises - Heel Raises with Counter Support  - 1-2 x daily - 7 x weekly - 2 sets - 10 reps - Standing March with Counter Support  - 1-2 x daily - 7 x weekly - 2 sets - 10 reps - Standing Hip Abduction with Counter Support  - 1-2 x daily - 7 x weekly - 2 sets - 10 reps - Standing Hip Extension with Counter Support  - 1-2 x daily - 7 x weekly - 2 sets - 10 reps - Mini Squat with Counter Support  - 1-2 x daily - 7 x weekly - 2 sets - 10 reps - Standing Knee Flexion with Counter Support  - 1-2 x daily - 7 x weekly - 2 sets - 10 reps  ASSESSMENT:  CLINICAL IMPRESSION: Initial O2 level taken at 88% after walk into clinic from her car. This dropped to 83% but improved instantly to 96% after she warmed her hands up. She managed to maintain O2 level at 96% during LE strengthening exercises. Able to increase resistance with machines and add in triceps press without complaint. Upon leaving clinic, saO2 was captured at 99%. Will continue to progress strengthening/endurance while monitoring O2 level.   OBJECTIVE IMPAIRMENTS: Abnormal gait, cardiopulmonary status limiting activity, decreased activity tolerance, decreased balance, decreased endurance, decreased mobility, difficulty walking, decreased ROM, decreased strength, and pain.   ACTIVITY LIMITATIONS: carrying, lifting, bending, standing, squatting, stairs, transfers, bathing, dressing, and locomotion level  PARTICIPATION LIMITATIONS: meal prep, cleaning, laundry, driving, shopping, community activity, and yard work  PERSONAL FACTORS: 3+ comorbidities: Knees, Loweback, and wrist arthritis. CHF taking lasix, post inflamitory pulmonary fibrosis; vertigo ( still has syncope at times) are also affecting patient's functional outcome.    REHAB POTENTIAL: Good  CLINICAL DECISION MAKING: Evolving/moderate complexity declining overall mobility   EVALUATION COMPLEXITY: Moderate   GOALS: Goals reviewed with patient? Yes  SHORT TERM GOALS: Target date: 02/13/2023   Therapy will perform 6-minute walk test with patient and come up with baseline goals Baseline: Goal status: MET 01/26/23  2.  Patient will decrease sit to stand time time by 5 seconds Baseline:  Goal status: In Progress (4/15)  3.  Patient will perform 10 minutes of interval training on NuStep while maintaining a heart rate less than 110 Baseline:  Goal status: Met 4/8  LONG TERM GOALS: Target date: 03/13/2023    Patient will have a complete exercise program that she can take to [employer] sne[employer] and continue long-term Baseline:  Goal status: progressing towards program  2.  Patient will ambulate 600 feet without self-report of dyspnea in order to attend appointments without being short of breath Baseline:  Goal status:  not achieved, in progress 4/9  3.  Patient will stand for greater than 20 minutes without increased pain in left knee and increased dyspnea in order to perform ADLs Baseline:  Goal status: In progress 4/9    PLAN:  PT FREQUENCY: 2x/week  PT DURATION: 8 weeks  PLANNED INTERVENTIONS: Therapeutic exercises, Therapeutic activity, Neuromuscular re-education, Balance training, Gait training, Patient/Family education, Self Care, Joint mobilization, Stair training, DME instructions, Aquatic Therapy, Dry Needling, Electrical stimulation, Spinal mobilization, Cryotherapy, Moist heat, Ultrasound, and Manual therapy  PLAN FOR NEXT SESSION: Continue to progress patient HEP. Consider 2 laps of gait training. Consider standing exercise; mini squats and standing marches on foam pad to work on balance. Consider forward step ups with 2in.  Riki Altes, PTA  04/11/2023, 9:35 AM

## 2023-04-14 NOTE — Progress Notes (Unsigned)
Care Management & Coordination Services Pharmacy Note  04/14/2023 Name:  Bonnie Gonzalez MRN:  [id number] DOB:  January 01, 1956  Summary: ***  Recommendations/Changes made from today's visit: ***  Follow up plan: ***   Subjective: Bonnie Gonzalez is an 67 y.o. year old female who is a primary patient of Nelwyn Salisbury, MD.  The care coordination team was consulted for assistance with disease management and care coordination needs.    Engaged with patient by telephone for initial visit.  Recent office visits: 12/05/22 Nelwyn Salisbury, MD - Patient presented for Acute bronchitis with COPD and other concerns. Prescribed Levofloxacin.    11/11/22 Nelwyn Salisbury, MD - Patient presented for Bilateral hand numbness and other concerns. Prescribed Terbinafine. Stopped Methylprednisolone.  Recent consult visits: 03/27/23 Dessie Coma, PT - Patient presented for Physical deconditioning and other concerns No medication changes.   03/19/23 Tat, Octaviano Batty, DO (Neurology) - Patient presented via video for Essential tremor. No medication changes.    03/03/23 Benita Stabile, FNP (Endo) - Patient presented for Type 2 diabetes mellitus with hyperglycemia with long term current use of insulin. Increased Toujeo to 80u 2x daily   01/06/25 Napoleon Form, MD  [facility]) - Patient presented for Chronic idiopathic constipation and other concerns. No medication changes   01/05/23 Love, Synetta Shadow., MD  [facility]) - Patient presented via telemedicine for major depressive disorder recurrent and other concerns. Stopped Ultravate.    10/28/22 Benita Stabile, FNP (Endocrinology) - Patient presented for Type 2 diabetes mellitus with hyperglycemia with long term current use of insulin. No medication changes.   Hospital visits: None in previous 6 months   Objective:  Lab Results  Component Value Date   CREATININE 0.64 05/28/2022   BUN 13 05/28/2022   GFR 91.44 03/04/2022   EGFR 97  05/28/2022   GFRNONAA >60 02/11/2019   GFRAA >60 02/11/2019   NA 141 05/28/2022   K 4.1 05/28/2022   CALCIUM 9.6 05/28/2022   CO2 30 (H) 05/28/2022   GLUCOSE 137 (H) 05/28/2022    Lab Results  Component Value Date/Time   HGBA1C 7.5 (H) 03/04/2022 10:03 AM   HGBA1C 9.1 06/14/2021 12:00 AM   HGBA1C 10.0 (H) 01/11/2019 02:10 AM   GFR 91.44 03/04/2022 10:03 AM   GFR 80.60 10/25/2018 04:55 PM   MICROALBUR <0.7 10/18/2015 11:26 AM   MICROALBUR 0.5 04/11/2014 02:11 PM    Last diabetic Eye exam: No results found for: "HMDIABEYEEXA"  Last diabetic Foot exam: No results found for: "HMDIABFOOTEX"   Lab Results  Component Value Date   CHOL 131 03/04/2022   HDL 42.10 03/04/2022   LDLCALC 73 03/04/2022   LDLDIRECT 176.0 04/10/2021   TRIG 81.0 03/04/2022   CHOLHDL 3 03/04/2022       Latest Ref Rng & Units 03/04/2022   10:03 AM 11/13/2021    9:14 AM 09/30/2021    9:15 AM  Hepatic Function  Total Protein 6.0 - 8.3 g/dL 7.2     Albumin 3.5 - 5.2 g/dL 3.9     AST 0 - 37 U/L 25     ALT 0 - 35 U/L 29  44  35   Alk Phosphatase 39 - 117 U/L 92     Total Bilirubin 0.2 - 1.2 mg/dL 0.4     Bilirubin, Direct 0.0 - 0.3 mg/dL 0.1       Lab Results  Component Value Date/Time   TSH 2.87 03/04/2022 10:03 AM  TSH 3.110 09/03/2021 12:12 PM   FREET4 0.98 03/24/2017 10:45 AM       Latest Ref Rng & Units 03/04/2022   10:03 AM 09/03/2021   12:12 PM 02/11/2019    2:16 AM  CBC  WBC 4.0 - 10.5 K/uL 9.9  11.0  9.1   Hemoglobin 12.0 - 15.0 g/dL 16.1  09.6  04.5   Hematocrit 36.0 - 46.0 % 38.1  40.6  44.2   Platelets 150.0 - 400.0 K/uL 196.0  208  225     Lab Results  Component Value Date/Time   VD25OH 6.42 (L) 02/03/2017 09:14 AM   VITAMINB12 918 (H) 01/03/2014 09:53 AM    Clinical ASCVD: No  The 10-year ASCVD risk score (Arnett DK, et al., 2019) is: 21.8%   Values used to calculate the score:     Age: 35 years     Sex: Female     Is Non-Hispanic African American: No     Diabetic:  Yes     Tobacco smoker: No     Systolic Blood Pressure: 163 mmHg     Is BP treated: Yes     HDL Cholesterol: 42.1 mg/dL     Total Cholesterol: 131 mg/dL       4/0/9811   91:47 PM 11/11/2022    1:46 PM 06/16/2022    4:49 PM  Depression screen PHQ 2/9  Decreased Interest 3 1 1   Down, Depressed, Hopeless 2 1 1   PHQ - 2 Score 5 2 2   Altered sleeping 2 1 1   Tired, decreased energy 3 1 2   Change in appetite 0 1 2  Feeling bad or failure about yourself  2 1 1   Trouble concentrating 2 1 2   Moving slowly or fidgety/restless 1 0 0  Suicidal thoughts 1 0 0  PHQ-9 Score 16 7 10   Difficult doing work/chores Very difficult Somewhat difficult Somewhat difficult     Social History   Tobacco Use  Smoking Status Former   Packs/day: 1.00   Years: 35.00   Additional pack years: 0.00   Total pack years: 35.00   Types: Cigarettes   Quit date: 12/01/2008   Years since quitting: 14.3  Smokeless Tobacco Never   BP Readings from Last 3 Encounters:  01/06/23 138/70  12/05/22 118/72  11/11/22 136/70   Pulse Readings from Last 3 Encounters:  01/06/23 69  12/05/22 72  11/11/22 74   Wt Readings from Last 3 Encounters:  01/06/23 266 lb (120.7 kg)  12/05/22 265 lb (120.2 kg)  11/11/22 252 lb (114.3 kg)   BMI Readings from Last 3 Encounters:  01/06/23 51.95 kg/m  12/05/22 51.75 kg/m  11/11/22 49.22 kg/m    Allergies  Allergen Reactions   Aspirin     Mother had to be resuscitated after ingesting this   Hydrocodone-Acetaminophen Hives   Oxycodone-Acetaminophen Hives   Penicillins Hives    Did it involve swelling of the face/tongue/throat, SOB, or low BP? Yes-hives Did it involve sudden or severe rash/hives, skin peeling, or any reaction on the inside of your mouth or nose? Unk Did you need to seek medical attention at a hospital or doctor's office? Yes When did it last happen? Was in her 30's If all above answers are "NO", may proceed with cephalosporin use.  Did it involve  swelling of the face/tongue/throat, SOB, or low BP? Yes-hives Did it involve sudden or severe rash/hives, skin peeling, or any reaction on the inside of your mouth or nose? Unk Did you  need to seek medical attention at a hospital or doctor's office? Yes When did it last happen? Was in her 30's If all above answers are "NO", may proceed with cephalosporin use.   Codeine Hives   Fluoxetine Hcl Itching and Other (See Comments)    "Hair itching"   Trazodone Other (See Comments)    Couldn't walk    Medications Reviewed Today     Reviewed by Lenore Manner, PTA (Physical Therapy Assistant) on 04/11/23 at 0909  Med List Status: <None>   Medication Order Taking? Sig Documenting Provider Last Dose Status Informant  albuterol (PROVENTIL HFA;VENTOLIN HFA) 108 (90 Base) MCG/ACT inhaler [id number] No Inhale 2 puffs into the lungs every 4 (four) hours as needed for wheezing or shortness of breath. Nelwyn Salisbury, MD Taking Active Self  ALPRAZolam Prudy Feeler) 0.5 MG tablet [id number] No TAKE 1 TABLET (0.5 MG TOTAL) BY MOUTH 2 (TWO) TIMES DAILY AS NEEDED.  Patient taking differently: Take 0.25-0.5 mg by mouth 2 (two) times daily as needed for anxiety or sleep.   Nelwyn Salisbury, MD Taking Active Self           Med Note Farris Has May 12, 2022 12:00 PM)    atorvastatin (LIPITOR) 80 MG tablet [id number] No Take 1 tablet by mouth once daily Nelwyn Salisbury, MD Taking Active   azelastine (ASTELIN) 0.1 % nasal spray [id number] No Place 1 spray into both nostrils 2 (two) times daily as needed for allergies. Nelwyn Salisbury, MD Taking Active   benzonatate (TESSALON) 200 MG capsule [id number] No TAKE 1 CAPSULE BY MOUTH EVERY 8 HOURS AS NEEDED FOR COUGH Nelwyn Salisbury, MD Taking Active   buPROPion [facility]) 100 MG 12 hr tablet [id number] No Take 100 mg by mouth daily. [provider] Taking Active   cetirizine (ZYRTEC) 10 MG tablet [id number] No Take 10 mg by mouth daily. [provider] Taking Active Self  Cholecalciferol 125 MCG (5000 UT) TABS [id number] No Take 5,000 Units by mouth in the morning. [provider] Taking Active Self  Dextromethorphan-guaiFENesin [facility] COUGH & CHEST CONGEST PO) [id number] No Take 1 capsule by mouth as needed (Patient taking as needed). [provider] Taking Active Self  DULoxetine (CYMBALTA) 60 MG capsule [id number] No Take 1 capsule (60 mg total) by mouth daily.  Patient taking differently: Take 120 mg by mouth at bedtime. Take two capsules daily   Nelwyn Salisbury, MD Taking Active Self  esomeprazole (NEXIUM) 20 MG capsule [id number]  TAKE 1 CAPSULE BY MOUTH ONCE DAILY BEFORE BREAKFAST Nelwyn Salisbury, MD  Active   Fluticasone-Umeclidin-Vilant (TRELEGY ELLIPTA) 100-62.5-25 MCG/INH AEPB [id number] No Inhale 1 puff into the lungs daily. [provider] Taking Active Self  furosemide (LASIX) 40 MG tablet [id number]  Take 1 tablet by mouth once daily Nelwyn Salisbury, MD  Active   glucose 4 GM chewable tablet [id number] No Chew 1 tablet (4 g total) by mouth as needed for low blood sugar (< 70). Noralee Stain, DO Taking Active Self  hydrOXYzine (VISTARIL) 25 MG capsule [id number]  Take 1 capsule by mouth three times daily as needed Nelwyn Salisbury, MD  Active   Hyprom-Naphaz-Polysorb-Zn Sulf (CLEAR EYES COMPLETE) SOLN [id number] No Place 1-2 drops into both eyes as needed (for itching). [provider] Taking Active Self  insulin aspart (NOVOLOG) 100 UNIT/ML injection [id number] No Use sliding scale provided at time of discharge, administer 3 times daily with meals  Patient taking differently: Inject 15-48 Units into the skin See admin instructions. Inject 20-40 units subcutaneously 3 times daily with meals and inject 15-35 units subcutaneously at night if needed with nighttime snacking   Noralee Stain, DO Taking Active Self  insulin glargine, 2 Unit Dial, (TOUJEO MAX SOLOSTAR) 300 UNIT/ML Solostar Pen [id number] No Inject 80  Units into the skin 2 (two) times daily. [provider] Taking Active Self  Insulin Pen Needle (B-D UF III MINI PEN NEEDLES) 31G X 5 MM MISC [id number] No Use to administer insulin five times a day [provider] Taking Active Self  levothyroxine (SYNTHROID) 175 MCG tablet [id number]  TAKE 1 TABLET BY MOUTH ONCE DAILY BEFORE BREAKFAST. *Labs required for future refills* Nelwyn Salisbury, MD  Active   linaclotide Karlene Einstein) 290 MCG CAPS capsule [id number]  Take 1 capsule (290 mcg total) by mouth daily before breakfast. Napoleon Form, MD  Active   methocarbamol (ROBAXIN) 500 MG tablet [id number] No Take 1 tablet (500 mg total) by mouth every 6 (six) hours as needed for muscle spasms. Nelwyn Salisbury, MD Taking Active Self  naproxen (NAPROSYN) 500 MG tablet [id number] No Take 1 tablet by mouth twice daily Nelwyn Salisbury, MD Taking Active   Nerve Stimulator (CLEVER CHOICE TENS UNIT) DEVI [id number] No Place 1 Dose onto the skin as needed (pain.). [provider] Taking Active Self  nystatin (MYCOSTATIN/NYSTOP) powder [id number]  Apply 1 Application topically 2 (two) times daily as needed (rash). Nelwyn Salisbury, MD  Active   OXYGEN [id number] No Inhale 2 L into the lungs continuous. [provider] Taking Active Self  primidone (MYSOLINE) 50 MG tablet [id number]  Take 1 tablet (50 mg total) by mouth at bedtime. Vladimir Faster, DO  Active   Probiotic Product (MISC INTESTINAL FLORA REGULAT) CAPS [id number] No Take 1 capsule by mouth daily. [provider] Taking Active Self  temazepam (RESTORIL) 15 MG capsule [id number] No Take 15 mg by mouth at bedtime. [provider] Taking Active Self  terbinafine (LAMISIL) 250 MG tablet [id number] No Take 1 tablet (250 mg total) by mouth daily. Nelwyn Salisbury, MD Taking Active   tirzepatide [facility]) 7.5 MG/0.5ML Pen [id number] No Inject 7.5 mg into the skin every Sunday. [provider] Taking Active Self             SDOH:  (Social Determinants of Health) assessments and interventions performed: Yes SDOH Interventions    Flowsheet Row Clinical Support from 04/15/2022 in [facility]  SDOH Interventions   Food Insecurity Interventions Intervention Not Indicated  Housing Interventions Intervention Not Indicated  Transportation Interventions Intervention Not Indicated  Financial Strain Interventions Intervention Not Indicated  Physical Activity Interventions Other (Comments)  [Due to COPD]  Stress Interventions Intervention Not Indicated  Social Connections Interventions Intervention Not Indicated       Medication Assistance: None required.  Patient affirms current coverage meets needs.  Medication Access: Within the past 30 days, how often has patient missed a dose of medication? None Is a pillbox or other method used to improve adherence? Yes  Factors that may affect medication adherence? no barriers identified Are meds synced by current pharmacy? No  Are meds delivered by current pharmacy? No  Does patient experience delays in picking up medications due to transportation concerns? No   [facility] Reviewed: Is patient disadvantaged to use [facility]?: Yes  Current Rx insurance plan: HTA Name and location of Current pharmacy:  [facility] - [place], [place] - [address] [place] [address] Phone: [phone] Fax: [phone]  [facility] - [place], [place] - [address] [place] [address] Phone: [phone] Fax: [phone]  [facility] services reviewed with patient today?: No  Patient requests to transfer care to [facility]?: No  Reason patient declined to change pharmacies: Disadvantaged due to insurance/mail order  Compliance/Adherence/Medication fill history: Care Gaps: Diabetic  Kidney eval urine - Overdue Foot Exam - Overdue Eye Exam - Overdue COVID Booster - Overdue HGB A1C - Overdue Lung Cancer Screen - Overdue AWV Overdue Hepatitis C Screen - Postponed  Star-Rating Drugs: Atorvastatin 80 mg - Last filled 03/22/23 90 DS at [facility] Verified    Assessment/Plan Hypertension (BP goal <140/90) -{US controlled/uncontrolled:25276} -Current treatment: *** -Medications previously tried: None  -Current home readings: *** -Current dietary habits: *** -Current exercise habits: *** -{ACTIONS;DENIES/REPORTS:21021675::"Denies"} hypotensive/hypertensive symptoms -Educated on {CCM BP Counseling:25124} -Counseled to monitor BP at home ***, document, and provide log at future appointments -{CCMPHARMDINTERVENTION:25122}  Hyperlipidemia: (LDL goal < 70) -{US controlled/uncontrolled:25276} -Current treatment: Atorvastatin 80mg  1 qd  -Medications previously tried: Crestor  -Current dietary patterns: *** -Current exercise habits: *** -Educated on {CCM HLD Counseling:25126} -{CCMPHARMDINTERVENTION:25122}  Diabetes (A1c goal <7%) -{US controlled/uncontrolled:25276} -Current medications: Glucose 4mg  chewable prn sugars <70 Novolog ss Mounjaro 7.5mg  once weekly?? Toujeo 80 units BID -Medications previously tried: Victoza, Januvia, Lantus  -Current home glucose readings fasting glucose: *** post prandial glucose: *** -{ACTIONS;DENIES/REPORTS:21021675::"Denies"} hypoglycemic/hyperglycemic symptoms -Current meal patterns:  breakfast: ***  lunch: ***  dinner: *** snacks: *** drinks: *** -Current exercise: *** -Educated on {CCM DM COUNSELING:25123} -Counseled to check feet daily and get yearly eye exams -{CCMPHARMDINTERVENTION:25122}  Heart Failure (Goal: manage symptoms and prevent exacerbations) -{US controlled/uncontrolled:25276} -Last ejection fraction: 60-5% (Date: 09/23/21) -HF type: HFpEF (EF > 50%) -NYHA Class: II (slight limitation of  activity) -AHA HF Stage: C (Heart disease and symptoms present) -Current treatment: Lasix 40mg  1 qd -Medications previously tried: None  -Current home BP/HR readings: *** -Current home daily weights: *** -Current dietary habits: *** -Current exercise habits: *** -Educated on {CCM HF Counseling:25125} -{CCMPHARMDINTERVENTION:25122}  COPD (Goal: control symptoms and prevent exacerbations) -{US controlled/uncontrolled:25276} -Current treatment  Albuterol HFA 1-2 puffs q 4-6h prn Azelastine nasal spray prn Zyrtec 10mg  1 qd Trelegy 1 puff daily Oxygen -Medications previously tried: ***  -Gold Grade: Gold 2 (FEV1 50-79%) -Current COPD Classification:  A (low sx, 0-1 moderate exacerbations, no hospitalizations) -Pulmonary function testing: 68.3% -Exacerbations requiring treatment in last 6 months: Anoro -Patient {Actions; denies-reports:120008} consistent use of maintenance inhaler -Frequency of rescue inhaler use: *** -Counseled on {CCMINHALERCOUNSELING:25121} -{CCMPHARMDINTERVENTION:25122}  Depression/Anxiety/Insomnia (Goal: Well-controlled mood that still allows for ADLs) -Not assessed today -Current treatment: Xanax 0.5mg  BID PRN Wellbutrin SR 100mg  1 qd Cymbalta 60mg  1 qd Hydroxyzine 25mg  1 TID prn Primidone 50mg  1 qhs Temazepm 15mg  1 qhs   GERD (Goal: minimize symptoms of reflux ) -Not assessed today -Current treatment  Nexium 20mg  1 qd before breakfast   Hypothyroidism (Goal TSH: 0.4-4.5 -Not assessed today -Current treatment: Levothyroxine 1 qd   I  Sherrill Raring Clinical Pharmacist [phone]

## 2023-04-15 ENCOUNTER — Encounter (HOSPITAL_BASED_OUTPATIENT_CLINIC_OR_DEPARTMENT_OTHER): Payer: Self-pay | Admitting: Physical Therapy

## 2023-04-15 ENCOUNTER — Ambulatory Visit (HOSPITAL_BASED_OUTPATIENT_CLINIC_OR_DEPARTMENT_OTHER): Payer: PPO | Admitting: Physical Therapy

## 2023-04-15 DIAGNOSIS — R2689 Other abnormalities of gait and mobility: Secondary | ICD-10-CM

## 2023-04-15 DIAGNOSIS — R5381 Other malaise: Secondary | ICD-10-CM | POA: Diagnosis not present

## 2023-04-15 NOTE — Therapy (Signed)
OUTPATIENT PHYSICAL THERAPY LOWER EXTREMITY treatment    Patient Name: Bonnie Gonzalez MRN: [id number] DOB:06-18-1956, 67 y.o., female Today's Date: 04/11/2023  END OF SESSION:  PT End of Session - 04/11/23 0910     Visit Number 18    Number of Visits 22    Date for PT Re-Evaluation 04/20/23    Authorization Type progress note done on visit 10    PT Start Time 0838    PT Stop Time 0923    PT Time Calculation (min) 45 min    Activity Tolerance Patient tolerated treatment well    Behavior During Therapy [facility] for tasks assessed/performed                 Past Medical History:  Diagnosis Date   Acquired hypothyroidism 10/01/2010   Allergic rhinitis 07/20/2009   Arthritis    Back pain 10/18/2015   Bilateral leg edema 03/31/2018   Centrilobular emphysema 02/04/2019   HRCT 09/15/18   Chronic diastolic CHF (congestive heart failure) 01/28/2019   Chronic respiratory failure with hypoxia 02/04/2019   6 Min Walk 08/31/18 at [facility]   Chronic urticaria 12/21/2019   COPD (chronic obstructive pulmonary disease) 09/01/2018   Quit smoking 2010  - Spirometry 08/31/2018  FEV1 1.1 (51%)  Ratio 66 s prior rx with typical curvature  - 08/31/2018  After extensive coaching inhaler device,  effectiveness =    90% with elipta > try anoro sample > no better so d/c'  - PFT's  11/10/2018  FEV1 1.24 (59 % ) ratio 75 (ratio with fev1/VC =  63)  p no % improvement from saba p nothing prior to study with DLCO  105  % corrects to 132  %    DOE (dyspnea on exertion) 08/31/2018   Onset 05/2018 with background of unexplained leg swelling x 01/2018   -echo 04/13/18 just G1   diastolic dysfunction s PH   -  08/31/2018   Walked RA x one lap @ 185 stopped due to  Sob/ sats 89% at nl pace  - Spirometry 08/31/2018  FEV1 1.1 (51%)  Ratio 66 s prior rx  - 09/28/2018 could not do full pfts 09/28/2018  - Collagen vasc profile 09/28/18  For ESR =  54 (was 102 before prednisone)  Neg collag   Dyshidrotic eczema  12/13/2010   Essential hypertension 01/26/2019   Generalized anxiety disorder with panic attacks 01/20/2018   GERD (gastroesophageal reflux disease) 11/03/2007   Headache 11/03/2007   Irritable bowel syndrome 07/13/2009   Major depressive disorder 07/17/2020   Mixed hyperlipidemia 07/20/2009   Obesity    OSA (obstructive sleep apnea) 07/26/2015   HST 08/2015 mild OSA, AHI 8/hour, lowest desaturation 81%, desaturations noted without respiratory events  Formatting of this note might be different from the original. PSG 05/20/19 AHI 25.1   Osteopenia of multiple sites 06/01/2019   DEXA 05/24/2019: Right femur neck T- 2.2, left femur neck T- 1.5, right total femur T -0.5, left total femur T -0.5, AP total spine T- 1.9. FRAX 10-year probability of major osteoporotic fracture is 8.8% and a hip fracture is 1.2%  DEXA 05/05/2019: Right femur neck T- 1.9, left femur neck T- 2.0, right total femur T -1.3, left total femur   Oxygen deficiency    Postinflammatory pulmonary fibrosis 09/15/2018   ESR    10/161/9  = 102   > rx short term prednisone ? slt improvement in symptoms HRCT 09/15/2018  1. Very mild basilar subpleural ground-glass, reticulation and  traction bronchiolectasis, findings which may be minimally progressive from 06/29/2017 and are indicative of interstitial lung disease such as nonspecific interstitial pneumonitis. Findings are indeterminate for UIP per consensus guidelin   Psychophysiological insomnia 03/29/2019   Type 2 diabetes mellitus 02/09/2017   UTI (urinary tract infection)    Vertigo 03/29/2014   Vitamin D deficiency 09/15/2020   Past Surgical History:  Procedure Laterality Date   ANKLE FRACTURE SURGERY Right    benign tumor      removed from groin   COLONOSCOPY WITH PROPOFOL N/A 05/15/2022   Procedure: COLONOSCOPY WITH PROPOFOL;  Surgeon: Napoleon Form, MD;  Location: [facility];  Service: Gastroenterology;  Laterality: N/A;   HOT HEMOSTASIS N/A 05/15/2022   Procedure: HOT  HEMOSTASIS (ARGON PLASMA COAGULATION/BICAP);  Surgeon: Napoleon Form, MD;  Location: [facility];  Service: Gastroenterology;  Laterality: N/A;   POLYPECTOMY  05/15/2022   Procedure: POLYPECTOMY;  Surgeon: Napoleon Form, MD;  Location: [facility];  Service: Gastroenterology;;   WRIST ARTHROCENTESIS N/A 2001   Patient Active Problem List   Diagnosis Date Noted   Morbid obesity (HCC) 06/16/2022   Physical deconditioning 06/16/2022   AVM (arteriovenous malformation) of colon, acquired with hemorrhage    Positive colorectal cancer screening using Cologuard test    Polyp of sigmoid colon    Polyp of transverse colon    Polyp of cecum    COVID-19 virus infection 11/21/2021   Vitamin D deficiency 09/15/2020   Major depressive disorder 07/17/2020   Chronic urticaria 12/21/2019   Osteopenia of multiple sites 06/01/2019   Psychophysiological insomnia 03/29/2019   Centrilobular emphysema 02/04/2019   Chronic respiratory failure with hypoxia 02/04/2019   Chronic diastolic CHF (congestive heart failure) 01/28/2019   Essential hypertension 01/26/2019   Postinflammatory pulmonary fibrosis 09/15/2018   COPD (chronic obstructive pulmonary disease) 09/01/2018   DOE (dyspnea on exertion) 08/31/2018   Bilateral leg edema 03/31/2018   Generalized anxiety disorder with panic attacks 01/20/2018   Type 2 diabetes mellitus 02/09/2017   Back pain 10/18/2015   OSA (obstructive sleep apnea) 07/26/2015   Vertigo 03/29/2014   Dyshidrotic eczema 12/13/2010   Acquired hypothyroidism 10/01/2010   Mixed hyperlipidemia 07/20/2009   Allergic rhinitis 07/20/2009   Irritable bowel syndrome 07/13/2009   GERD (gastroesophageal reflux disease) 11/03/2007   Headache 11/03/2007    PCP: Gershon Crane MD   REFERRING PROVIDER: Gershon Crane MD   REFERRING DIAG:  Diagnosis  J96.11 (ICD-10-CM) - Chronic respiratory failure with hypoxia (HCC)    THERAPY DIAG:  Physical deconditioning  Other  abnormalities of gait and mobility  Rationale for Evaluation and Treatment: Rehabilitation  ONSET DATE: has been on O2 for 3 years   SUBJECTIVE:   SUBJECTIVE STATEMENT: Patient reports no soreness at after last session. "I did some good stretches." End of Janki is when her breathing test is scheduled.  PERTINENT HISTORY: Knees, Loweback, and wrist arthritis. CHF taking lasix, post inflamitory pulmonary fibrosis; vertigo ( still has syncope at times)  PAIN:  Are you having pain? Yes: NPRS scale: 4/10 Pain location: Left knee Pain description: Aching Aggravating factors: Standing walking Relieving factors: Sitting  PRECAUTIONS: falls   WEIGHT BEARING RESTRICTIONS: No  FALLS:  Has patient fallen in last 6 months? No but has ad some near falls   LIVING ENVIRONMENT: Ramp into the house   OCCUPATION: retired  Recreation: would like to be able to get back to gardening  PLOF: Independent with household mobility with device  PATIENT GOALS: To increase endurance  NEXT MD VISIT: Nothing scheduled  OBJECTIVE:   DIAGNOSTIC FINDINGS: Nothing of the knee   COGNITION: Overall cognitive status: Within functional limits for tasks assessed     SENSATION: Has had tinglining in her hands. Has been using wrist guards   MUSCLE LENGTH:  POSTURE: rounded shoulders, forward head, and flexed trunk    LOWER EXTREMITY ROM:  Passive ROM Right eval Left eval  Hip flexion    Hip extension    Hip abduction    Hip adduction    Hip internal rotation    Hip external rotation    Knee flexion  Painful at end range  Knee extension    Ankle dorsiflexion    Ankle plantarflexion    Ankle inversion    Ankle eversion     (Blank rows = not tested)  LOWER EXTREMITY MMT:  MMT Right eval Left eval Right 4/8 Left 4//8  Hip flexion 19.7 17.0 33.4 29  Hip extension      Hip abduction 36.9 40.3 35 40.9  Hip adduction      Hip internal rotation      Hip external rotation      Knee  flexion      Knee extension 20 18.7 45 35  Ankle dorsiflexion      Ankle plantarflexion      Ankle inversion      Ankle eversion       (Blank rows = not tested)  LOWER EXTREMITY SPECIAL TESTS:   IE: 5x sit to stand: 24 sec   Hr 85  Sao2 96   01/26/23: walk test: 222ft total- seated rest break at 163ft and paused timer. 4/15: 5xSTS-21sec  GAIT:  TODAY'S TREATMENT:                                                                                                                               DATE:   5/15 Nu-step 3 min x2 Sao2 95 at baselin 98 after exercises   Ambulation 300' with 1 seated rest break.   Last visit:  Nustep x4 mins L3 UE and LE 90 O2 at entry after first set at 95 moderate dyspnea notes  Shuttle press 56-68# x15, 68#x15 (O2 dropped to 85% afterwards) LF row machine 2x15 20# Hip abduction 3x10 55# LF Triceps press down 40# 2x15  Sao2 monitored throughout the session. Frequently fluctuated between 85% and 96%   Gait: Ambulation 2x150' with seated rest break of 5 min Sao2 dropped to 83%, rose back to 96%     5/2 Nustep x48mins L2 95 O2 at entry  Bicep curls 3x10 3lbs Chest Press 3x10 3lbs Overhead press 3x10 3lbs  green band horizontal abduction 2x10 Green band er 2x10 Hamstring curls green band 2x10 Standing heel raises 2x10   Sao2 remained > 88 with frequent monitoring 4/26 Nustep x4mins L2 95 O2 at entry  Bicep curls 3x10 3lbs Chest Press 3x10 3lbs 1 full lap with break  in between  green band horizontal abduction 2x10 Green band er 2x10 Hamstring curls green band 2x10 Standing heel raises 2x10    4/22 NU step x5 mins L3 Gait 158ft x4 with seated rest breaks and O2 monitoring Bicep curls 3x10 3lbs Chest Press 3x10 3lbs Overhead shoulder press 3x10 3lbs Hip abduction green band 3x10 Seated bil ER 2x10 red band  PATIENT EDUCATION:  Education details: HEP, symptom management, activity progression Person educated: Patient Education  method: Explanation, Demonstration, Tactile cues, and Verbal cues Education comprehension: verbalized understanding, returned demonstration, verbal cues required, tactile cues required, and needs further education  HOME EXERCISE PROGRAM: Access Code: [email or web address] URL: [email or web address] Date: 01/19/2023 Prepared by: Riki Altes  Exercises - Heel Raises with Counter Support  - 1-2 x daily - 7 x weekly - 2 sets - 10 reps - Standing March with Counter Support  - 1-2 x daily - 7 x weekly - 2 sets - 10 reps - Standing Hip Abduction with Counter Support  - 1-2 x daily - 7 x weekly - 2 sets - 10 reps - Standing Hip Extension with Counter Support  - 1-2 x daily - 7 x weekly - 2 sets - 10 reps - Mini Squat with Counter Support  - 1-2 x daily - 7 x weekly - 2 sets - 10 reps - Standing Knee Flexion with Counter Support  - 1-2 x daily - 7 x weekly - 2 sets - 10 reps  ASSESSMENT:  CLINICAL IMPRESSION: Therapy reviewed knee stretching today with the patient. She felt a good stretch with the hamstring stretch. She felt a little more of a quad stretch with he standing gastroc stretch. We reviewed a seated gastroc stretch as well. Her overall o2 did well today. It was at her lowest when she first walked in. It was in the mid 80's but went up. Her O2 did well otherwise. We will continue to progress exercises as tolerated.   OBJECTIVE IMPAIRMENTS: Abnormal gait, cardiopulmonary status limiting activity, decreased activity tolerance, decreased balance, decreased endurance, decreased mobility, difficulty walking, decreased ROM, decreased strength, and pain.   ACTIVITY LIMITATIONS: carrying, lifting, bending, standing, squatting, stairs, transfers, bathing, dressing, and locomotion level  PARTICIPATION LIMITATIONS: meal prep, cleaning, laundry, driving, shopping, community activity, and yard work  PERSONAL FACTORS: 3+ comorbidities: Knees, Loweback, and wrist arthritis. CHF taking lasix, post  inflamitory pulmonary fibrosis; vertigo ( still has syncope at times) are also affecting patient's functional outcome.   REHAB POTENTIAL: Good  CLINICAL DECISION MAKING: Evolving/moderate complexity declining overall mobility   EVALUATION COMPLEXITY: Moderate   GOALS: Goals reviewed with patient? Yes  SHORT TERM GOALS: Target date: 02/13/2023   Therapy will perform 6-minute walk test with patient and come up with baseline goals Baseline: Goal status: MET 01/26/23  2.  Patient will decrease sit to stand time time by 5 seconds Baseline:  Goal status: In Progress (4/15)  3.  Patient will perform 10 minutes of interval training on NuStep while maintaining a heart rate less than 110 Baseline:  Goal status: Met 4/8  LONG TERM GOALS: Target date: 03/13/2023    Patient will have a complete exercise program that she can take to [employer] sne[employer] and continue long-term Baseline:  Goal status: progressing towards program  2.  Patient will ambulate 600 feet without self-report of dyspnea in order to attend appointments without being short of breath Baseline:  Goal status:  not achieved, in progress 4/9  3.  Patient will stand for greater than 20 minutes  without increased pain in left knee and increased dyspnea in order to perform ADLs Baseline:  Goal status: In progress 4/9    PLAN:  PT FREQUENCY: 2x/week  PT DURATION: 8 weeks  PLANNED INTERVENTIONS: Therapeutic exercises, Therapeutic activity, Neuromuscular re-education, Balance training, Gait training, Patient/Family education, Self Care, Joint mobilization, Stair training, DME instructions, Aquatic Therapy, Dry Needling, Electrical stimulation, Spinal mobilization, Cryotherapy, Moist heat, Ultrasound, and Manual therapy  PLAN FOR NEXT SESSION: Continue to progress patient HEP. Consider 2 laps of gait training. Consider standing exercise; mini squats and standing marches on foam pad to work on balance. Consider forward step ups  with 2in.  Riki Altes, PTA  04/11/2023, 9:35 AM

## 2023-04-16 ENCOUNTER — Telehealth (INDEPENDENT_AMBULATORY_CARE_PROVIDER_SITE_OTHER): Payer: PPO | Admitting: Family Medicine

## 2023-04-16 ENCOUNTER — Telehealth: Payer: Self-pay | Admitting: Family Medicine

## 2023-04-16 ENCOUNTER — Encounter: Payer: Self-pay | Admitting: Family Medicine

## 2023-04-16 VITALS — Wt 267.0 lb

## 2023-04-16 DIAGNOSIS — Z0000 Encounter for general adult medical examination without abnormal findings: Secondary | ICD-10-CM | POA: Diagnosis not present

## 2023-04-16 NOTE — Progress Notes (Signed)
PATIENT CHECK-IN and HEALTH RISK ASSESSMENT QUESTIONNAIRE:  -completed by phone/video for upcoming Medicare Preventive Visit  Pre-Visit Check-in: 1)Vitals (height, wt, BP, etc) - record in vitals section for visit on day of visit 2)Review and Update Medications, Allergies PMH, Surgeries, Social history in Epic 3)Hospitalizations in the last year with date/reason? No  4)Review and Update Care Team (patient's specialists) in Epic 5) Complete PHQ9 in Epic  6) Complete Fall Screening in Epic 7)Review all Health Maintenance Due and order under PCP if not done. Requested records from [facility] 8)Medicare Wellness Questionnaire: Answer theses question about your habits: Do you drink alcohol? No If yes, how many drinks do you have a day? N/a Have you ever smoked? Yes  Quit date if applicable?  2010  How many packs a day do/did you smoke? 1ppd Do you use smokeless tobacco? No Do you use an illicit drugs? No Do you exercises? No IF so, what type and how many days/minutes per week? Goes to PT twice a week Are you sexually active? No Number of partners? N/a Typical breakfast cottage cheese with berries Typical lunch may skip lunch; yogurt with berries Typical dinner protein, veggies; salad Typical snacks: protein bar; cheese, peanut butter & celery; sugar free popsicles  Beverages: Coffee, water, flavored water; zero sugar sprite or lemonade Wt today: 267lbs  Answer theses question about you: Can you perform most household chores? Yes, but takes a while to do them Do you find it hard to follow a conversation in a noisy room? Yes Do you often ask people to speak up or repeat themselves? Yes Do you feel that you have a problem with memory? Yes Do you balance your checkbook and or bank acounts? Yes Do you feel safe at home? Yes Last dentist visit? Only goes when absolutely necessary; 3-4 years "maybe more" Do you need assistance with any of the following: Please note if so    Driving? No  Feeding yourself? No  Getting from bed to chair? No  Getting to the toilet? No  Bathing or showering? "About to get there unless PT can help with getting weight under control"  Dressing yourself? "Takes a little bit, but can do it"  Managing money? No  Climbing a flight of stairs Yes; uses a ramp  Preparing meals? No  Do you have Advanced Directives in place (Living Will, Healthcare Power or Attorney)? No   Last eye Exam and location? [facility] Nov/Dec 2023   Do you currently use prescribed or non-prescribed narcotic or opioid pain medications? No  Do you have a history or close family history of breast, ovarian, tubal or peritoneal cancer or a family member with BRCA (breast cancer susceptibility 1 and 2) gene mutations? No  Nurse/Assistant Credentials/time stamp: Claudette Laws BSN, Editor, commissioning [facility] Clinical RN Supervisor    ----------------------------------------------------------------------------------------------------------------------------------------------------------------------------------------------------------------------   MEDICARE ANNUAL PREVENTIVE VISIT WITH PROVIDER: (Welcome to [facility], initial annual wellness or annual wellness exam)  Virtual Visit via Video /Phone Note  I connected with Bonnie Gonzalez on 04/16/23 by  a video enabled telemedicine application and verified that I am speaking with the correct person using two identifiers. Started out as video visit but then video stopped working so completed visit as a phone call. Over 50% of visit in telephone mode.   Location patient: home Location provider:work or home office Persons participating in the virtual visit: patient, provider  Concerns and/or follow up today: Reports is doing well. Has started physical therapy at [facility] -  she wears O2 so it is going slowly.  Pulmonologist did bump up her O2 a little for her exercise - on 3L via [place]. Now is able to get  around with her walker - so making some headway. She would like to lose some weight.  See HM section in Epic for other details of completed HM.    ROS: negative for report of fevers, unintentional weight loss, vision changes, vision loss, hearing loss or change, chest pain, change in sob, hemoptysis, melena, hematochezia, hematuria, falls, bleeding or bruising, thoughts of suicide or self harm, memory loss  Patient-completed extensive health risk assessment - reviewed and discussed with the patient: See Health Risk Assessment completed with patient prior to the visit either above or in recent phone note. This was reviewed in detailed with the patient today and appropriate recommendations, orders and referrals were placed as needed per Summary below and patient instructions.   Review of Medical History: -PMH, PSH, Family History and current specialty and care providers reviewed and updated and listed below   Patient Care Team: Nelwyn Salisbury, MD as PCP - General (Family Medicine) Quintella Reichert, MD as PCP - Cardiology (Cardiology) Tat, Octaviano Batty, DO as Consulting Physician (Neurology) [facility] [place]   Past Medical History:  Diagnosis Date   Acquired hypothyroidism 10/01/2010   Allergic rhinitis 07/20/2009   Arthritis    Back pain 10/18/2015   Bilateral leg edema 03/31/2018   Centrilobular emphysema 02/04/2019   HRCT 09/15/18   Chronic diastolic CHF (congestive heart failure) 01/28/2019   Chronic respiratory failure with hypoxia 02/04/2019   6 Min Walk 08/31/18 at [facility]   Chronic urticaria 12/21/2019   COPD (chronic obstructive pulmonary disease) 09/01/2018   Quit smoking 2010  - Spirometry 08/31/2018  FEV1 1.1 (51%)  Ratio 66 s prior rx with typical curvature  - 08/31/2018  After extensive coaching inhaler device,  effectiveness =    90% with elipta > try anoro sample > no better so d/c'  - PFT's  11/10/2018  FEV1 1.24 (59 % ) ratio 75 (ratio with fev1/VC =   63)  p no % improvement from saba p nothing prior to study with DLCO  105  % corrects to 132  %    DOE (dyspnea on exertion) 08/31/2018   Onset 05/2018 with background of unexplained leg swelling x 01/2018   -echo 04/13/18 just G1   diastolic dysfunction s PH   -  08/31/2018   Walked RA x one lap @ 185 stopped due to  Sob/ sats 89% at nl pace  - Spirometry 08/31/2018  FEV1 1.1 (51%)  Ratio 66 s prior rx  - 09/28/2018 could not do full pfts 09/28/2018  - Collagen vasc profile 09/28/18  For ESR =  54 (was 102 before prednisone)  Neg collag   Dyshidrotic eczema 12/13/2010   Essential hypertension 01/26/2019   Generalized anxiety disorder with panic attacks 01/20/2018   GERD (gastroesophageal reflux disease) 11/03/2007   Headache 11/03/2007   Irritable bowel syndrome 07/13/2009   Major depressive disorder 07/17/2020   Mixed hyperlipidemia 07/20/2009   Obesity    OSA (obstructive sleep apnea) 07/26/2015   HST 08/2015 mild OSA, AHI 8/hour, lowest desaturation 81%, desaturations noted without respiratory events  Formatting of this note might be different from the original. PSG 05/20/19 AHI 25.1   Osteopenia of multiple sites 06/01/2019   DEXA 05/24/2019: Right femur neck T- 2.2, left femur neck T- 1.5, right total femur T -  0.5, left total femur T -0.5, AP total spine T- 1.9. FRAX 10-year probability of major osteoporotic fracture is 8.8% and a hip fracture is 1.2%  DEXA 05/05/2019: Right femur neck T- 1.9, left femur neck T- 2.0, right total femur T -1.3, left total femur   Oxygen deficiency    Postinflammatory pulmonary fibrosis 09/15/2018   ESR    10/161/9  = 102   > rx short term prednisone ? slt improvement in symptoms HRCT 09/15/2018  1. Very mild basilar subpleural ground-glass, reticulation and traction bronchiolectasis, findings which may be minimally progressive from 06/29/2017 and are indicative of interstitial lung disease such as nonspecific interstitial pneumonitis. Findings are indeterminate for UIP  per consensus guidelin   Psychophysiological insomnia 03/29/2019   Type 2 diabetes mellitus 02/09/2017   UTI (urinary tract infection)    Vertigo 03/29/2014   Vitamin D deficiency 09/15/2020    Past Surgical History:  Procedure Laterality Date   ANKLE FRACTURE SURGERY Right    benign tumor      removed from groin   COLONOSCOPY WITH PROPOFOL N/A 05/15/2022   Procedure: COLONOSCOPY WITH PROPOFOL;  Surgeon: Napoleon Form, MD;  Location: [facility];  Service: Gastroenterology;  Laterality: N/A;   EYE SURGERY     HOT HEMOSTASIS N/A 05/15/2022   Procedure: HOT HEMOSTASIS (ARGON PLASMA COAGULATION/BICAP);  Surgeon: Napoleon Form, MD;  Location: [facility];  Service: Gastroenterology;  Laterality: N/A;   POLYPECTOMY  05/15/2022   Procedure: POLYPECTOMY;  Surgeon: Napoleon Form, MD;  Location: [facility];  Service: Gastroenterology;;   WRIST ARTHROCENTESIS N/A 2001    Social History   Socioeconomic History   Marital status: Divorced    Spouse name: Not on file   Number of children: 4   Years of education: 13   Highest education level: Some college, no degree  Occupational History    Comment: retired  Tobacco Use   Smoking status: Former    Packs/day: 1.00    Years: 35.00    Additional pack years: 0.00    Total pack years: 35.00    Types: Cigarettes    Quit date: 12/01/2008    Years since quitting: 14.3   Smokeless tobacco: Never  Vaping Use   Vaping Use: Never used  Substance and Sexual Activity   Alcohol use: Not Currently   Drug use: No   Sexual activity: Not Currently  Other Topics Concern   Not on file  Social History Narrative   Lives alone.   She has four grown children.   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   Highest level of education:  1.5 years of college      Right Handed   Is on 3 L of oxygen    Social Determinants of Health   Financial Resource Strain: Low Risk  (04/15/2022)   Overall Financial  Resource Strain (CARDIA)    Difficulty of Paying Living Expenses: Not hard at all  Food Insecurity: No Food Insecurity (04/15/2022)   Hunger Vital Sign    Worried About Running Out of Food in the Last Year: Never true    Ran Out of Food in the Last Year: Never true  Transportation Needs: No Transportation Needs (04/15/2022)   PRAPARE - Administrator, Civil Service (Medical): No    Lack of Transportation (Non-Medical): No  Physical Activity: Inactive (04/16/2023)   Exercise Vital Sign    Days of Exercise per Week: 0 days    Minutes  of Exercise per Session: 0 min  Stress: No Stress Concern Present (04/15/2022)   [employer] of Occupational Health - Occupational Stress Questionnaire    Feeling of Stress : Only a little  Social Connections: Socially Isolated (04/15/2022)   Social Connection and Isolation Panel [NHANES]    Frequency of Communication with Friends and Family: More than three times a week    Frequency of Social Gatherings with Friends and Family: More than three times a week    Attends Religious Services: Never    Database administrator or Organizations: No    Attends Banker Meetings: Never    Marital Status: Divorced  Catering manager Violence: Not At Risk (04/15/2022)   Humiliation, Afraid, Rape, and Kick questionnaire    Fear of Current or Ex-Partner: No    Emotionally Abused: No    Physically Abused: No    Sexually Abused: No    Family History  Problem Relation Age of Onset   Arthritis/Rheumatoid Mother    Alzheimer's disease Father    Diabetes Mellitus II Father    Dementia Father    Diabetes Mellitus II Sister    Dementia Brother    Diabetes Mellitus II Brother    Asthma Other        fhx   Depression Other        fhx   Diabetes Other        fhx   Parkinsonism Other        fhx   Lupus Other        fhx    Current Outpatient Medications on File Prior to Visit  Medication Sig Dispense Refill   albuterol (PROVENTIL  HFA;VENTOLIN HFA) 108 (90 Base) MCG/ACT inhaler Inhale 2 puffs into the lungs every 4 (four) hours as needed for wheezing or shortness of breath. 1 Inhaler 11   ALPRAZolam (XANAX) 0.5 MG tablet TAKE 1 TABLET (0.5 MG TOTAL) BY MOUTH 2 (TWO) TIMES DAILY AS NEEDED. (Patient taking differently: Take 0.25-0.5 mg by mouth 2 (two) times daily as needed for anxiety or sleep.) 60 tablet 5   atorvastatin (LIPITOR) 80 MG tablet Take 1 tablet by mouth once daily 90 tablet 3   azelastine (ASTELIN) 0.1 % nasal spray Place 1 spray into both nostrils 2 (two) times daily as needed for allergies. 30 mL 1   benzonatate (TESSALON) 200 MG capsule TAKE 1 CAPSULE BY MOUTH EVERY 8 HOURS AS NEEDED FOR COUGH 60 capsule 1   buPROPion ER (WELLBUTRIN SR) 100 MG 12 hr tablet Take 100 mg by mouth daily.     cetirizine (ZYRTEC) 10 MG tablet Take 10 mg by mouth daily.     Cholecalciferol 125 MCG (5000 UT) TABS Take 5,000 Units by mouth in the morning.     Continuous Glucose Sensor (FREESTYLE LIBRE 2 SENSOR) MISC Inject 1 kit into the skin every 14 (fourteen) days.     Dextromethorphan-guaiFENesin (MUCINEX COUGH & CHEST CONGEST PO) Take 1 capsule by mouth as needed (Patient taking as needed).     DULoxetine (CYMBALTA) 60 MG capsule Take 1 capsule (60 mg total) by mouth daily. (Patient taking differently: Take 120 mg by mouth at bedtime. Take two capsules daily) 90 capsule 3   esomeprazole (NEXIUM) 20 MG capsule TAKE 1 CAPSULE BY MOUTH ONCE DAILY BEFORE BREAKFAST 30 capsule 2   Fluticasone-Umeclidin-Vilant (TRELEGY ELLIPTA) 100-62.5-25 MCG/INH AEPB Inhale 1 puff into the lungs daily.     furosemide (LASIX) 40 MG tablet Take 1 tablet by  mouth once daily (Patient taking differently: Take 20 mg by mouth daily.) 90 tablet 0   glucose 4 GM chewable tablet Chew 1 tablet (4 g total) by mouth as needed for low blood sugar (< 70). 50 tablet 0   hydrOXYzine (VISTARIL) 25 MG capsule Take 1 capsule by mouth three times daily as needed 90 capsule 1    Hyprom-Naphaz-Polysorb-Zn Sulf (CLEAR EYES COMPLETE) SOLN Place 1-2 drops into both eyes as needed (for itching).     insulin aspart (NOVOLOG) 100 UNIT/ML injection Use sliding scale provided at time of discharge, administer 3 times daily with meals (Patient taking differently: Inject 15-48 Units into the skin See admin instructions. Inject 20-40 units subcutaneously 3 times daily with meals and inject 15-35 units subcutaneously at night if needed with nighttime snacking) 10 mL 0   insulin glargine, 2 Unit Dial, (TOUJEO MAX SOLOSTAR) 300 UNIT/ML Solostar Pen Inject 80 Units into the skin 2 (two) times daily.     Insulin Pen Needle (B-D UF III MINI PEN NEEDLES) 31G X 5 MM MISC Use to administer insulin five times a day     levothyroxine (SYNTHROID) 175 MCG tablet TAKE 1 TABLET BY MOUTH ONCE DAILY BEFORE BREAKFAST. *Labs required for future refills* 90 tablet 0   linaclotide (LINZESS) 290 MCG CAPS capsule Take 1 capsule (290 mcg total) by mouth daily before breakfast. 30 capsule 11   methocarbamol (ROBAXIN) 500 MG tablet Take 1 tablet (500 mg total) by mouth every 6 (six) hours as needed for muscle spasms. 120 tablet 5   MOUNJARO 12.5 MG/0.5ML Pen Inject 12.5 mg into the skin once a week.     naproxen (NAPROSYN) 500 MG tablet Take 1 tablet by mouth twice daily 60 tablet 5   Nerve Stimulator (CLEVER CHOICE TENS UNIT) DEVI Place 1 Dose onto the skin as needed (pain.).     nystatin (MYCOSTATIN/NYSTOP) powder Apply 1 Application topically 2 (two) times daily as needed (rash). 15 g 1   OXYGEN Inhale 3 L into the lungs continuous.     primidone (MYSOLINE) 50 MG tablet Take 1 tablet (50 mg total) by mouth at bedtime. 90 tablet 3   Rhubarb (ESTROVEN COMPLETE PO) Take 1 tablet by mouth daily.     temazepam (RESTORIL) 15 MG capsule Take 15 mg by mouth at bedtime.     terbinafine (LAMISIL) 250 MG tablet Take 1 tablet (250 mg total) by mouth daily. 90 tablet 1   Probiotic Product (MISC INTESTINAL FLORA REGULAT)  CAPS Take 1 capsule by mouth daily. (Patient not taking: Reported on 04/16/2023)     No current facility-administered medications on file prior to visit.    Allergies  Allergen Reactions   Aspirin     Mother had to be resuscitated after ingesting this   Hydrocodone-Acetaminophen Hives   Oxycodone-Acetaminophen Hives   Penicillins Hives    Did it involve swelling of the face/tongue/throat, SOB, or low BP? Yes-hives Did it involve sudden or severe rash/hives, skin peeling, or any reaction on the inside of your mouth or nose? Unk Did you need to seek medical attention at a hospital or doctor's office? Yes When did it last happen? Was in her 30's If all above answers are "NO", may proceed with cephalosporin use.  Did it involve swelling of the face/tongue/throat, SOB, or low BP? Yes-hives Did it involve sudden or severe rash/hives, skin peeling, or any reaction on the inside of your mouth or nose? Unk Did you need to seek medical attention at  a hospital or doctor's office? Yes When did it last happen? Was in her 30's If all above answers are "NO", may proceed with cephalosporin use.   Codeine Hives   Fluoxetine Hcl Itching and Other (See Comments)    "Hair itching"   Trazodone Other (See Comments)    Couldn't walk       Physical Exam There were no vitals filed for this visit. Estimated body mass index is 52.14 kg/m as calculated from the following:   Height as of 01/06/23: 5' (1.524 m).   Weight as of this encounter: 267 lb (121.1 kg).   EKG (optional): deferred due to virtual visit  GENERAL: alert, oriented, no acute distress detected, full vision exam deferred due to pandemic and/or virtual encounter  PSYCH/NEURO: pleasant and cooperative, no obvious depression or anxiety, speech and thought processing grossly intact, Cognitive function grossly intact  Flowsheet Row Video Visit from 04/16/2023 in [facility]  PHQ-9 Total Score 5            04/16/2023   10:40 AM 12/05/2022   12:13 PM 11/11/2022    1:46 PM 06/16/2022    4:49 PM 04/15/2022   10:51 AM  Depression screen PHQ 2/9  Decreased Interest 0 3 1 1  0  Down, Depressed, Hopeless 0 2 1 1  0  PHQ - 2 Score 0 5 2 2  0  Altered sleeping 1 2 1 1  0  Tired, decreased energy 1 3 1 2  0  Change in appetite 0 0 1 2 0  Feeling bad or failure about yourself  0 2 1 1  0  Trouble concentrating 2 2 1 2  0  Moving slowly or fidgety/restless 1 1 0 0 0  Suicidal thoughts 0 1 0 0 0  PHQ-9 Score 5 16 7 10  0  Difficult doing work/chores Somewhat difficult Very difficult Somewhat difficult Somewhat difficult Not difficult at all  Feels is doing well.      06/16/2022    4:49 PM 06/17/2022   11:22 AM 11/11/2022    1:45 PM 12/05/2022   12:13 PM 04/16/2023   10:36 AM  Fall Risk  Falls in the past year? 0 0 0 0 0  Was there an injury with Fall? 0 0 0 0 0  Fall Risk Category Calculator 0 0 0 0 0  Fall Risk Category (Retired) Low Low Low Low   (RETIRED) Patient Fall Risk Level Moderate fall risk Moderate fall risk Low fall risk Low fall risk   Patient at Risk for Falls Due to No Fall Risks  No Fall Risks No Fall Risks Impaired balance/gait;Impaired mobility  Fall risk Follow up Falls evaluation completed  Falls evaluation completed Falls evaluation completed Falls evaluation completed     SUMMARY AND PLAN:  Encounter for Medicare annual wellness exam   Discussed applicable health maintenance/preventive health measures and advised and referred or ordered per patient preferences: -reports had her mammogram every year - nurse is requesting report  -reports pulmonologist did ct of chest recently and was ok, advised staff to request - staff found report in care everywhere done 04/09/23 per staff -reports endocrinologist does foot exam at each visit -reports had eye exam with [facility] [facility] in nov/dec, staff requesting report -reports does her hgba1c and renal labs with her endocrinologist - seeing pcp soon  and plans to get labs(on May 21st) -she has declined the covid shot - discussed recommendations  Health Maintenance  Topic Date Due   Diabetic kidney evaluation -  Urine ACR  10/17/2016   FOOT EXAM  03/18/2019   OPHTHALMOLOGY EXAM  04/25/2019   MAMMOGRAM  05/16/2022   HEMOGLOBIN A1C  09/03/2022   Hepatitis C Screening  04/16/2023 (Originally 04/26/1974)   COVID-19 Vaccine (4 - 2023-24 season) 05/02/2023 (Originally 08/01/2022)   Diabetic kidney evaluation - eGFR measurement  05/29/2023   INFLUENZA VACCINE  07/02/2023   Lung Cancer Screening  04/08/2024   Medicare Annual Wellness (AWV)  04/15/2024   COLONOSCOPY (Pts 45-72yrs Insurance coverage will need to be confirmed)  05/15/2025   DTaP/Tdap/Td (4 - Td or Tdap) 12/24/2030   Pneumonia Vaccine 15+ Years old  Completed   DEXA SCAN  Completed   Zoster Vaccines- Shingrix  Completed   HPV VACCINES  Aged Out    Education and counseling on the following was provided based on the above review of health and a plan/checklist for the patient, along with additional information discussed, was provided for the patient in the patient instructions :  -Provided information on advanced directives - see patient instructions  -Provided counseling and plan for difficulty hearing, she declined audiology eval for now but is considering and agrees with check with her insurance if opts to pursue -Advised and counseled on a healthy lifestyle - including the importance of a healthy diet, regular physical activity, social connections and stress management. -Reviewed patient's current diet. Advised and counseled on a whole foods based healthy diet. A summary of a healthy diet was provided in the Patient Instructions.Congratulated on changes she has made in her diet and discussed several healthy whole foods based dietary patterns she could use as a guide to continue to improve her health.  -reviewed patient's current physical activity level and discussed exercise  guidelines for adults. Discussed community resources and ideas for safe exercise at home to assist in meeting exercise guideline recommendations in a safe and healthy way. She is working with PT and is making progress - congratulated and encouraged her to continue to work with her doctors and PT.  -Advise yearly dental visits at minimum and regular eye exams   Follow up: see patient instructions     Patient Instructions  I really enjoyed getting to talk with you today! I am available on Tuesdays and Thursdays for virtual visits if you have any questions or concerns, or if I can be of any further assistance.   CHECKLIST FROM ANNUAL WELLNESS VISIT:  -Follow up (please call to schedule if not scheduled after visit):   -yearly for annual wellness visit with primary care office  Here is a list of your preventive care/health maintenance measures and the plan for each if any are due:  PLAN For any measures below that may be due:  -nurse has requested copies of your mammogram and eye report -if you decide to get the covid booster can do so at the pharmacy -please keep your appointment with Dr. Clent Ridges for labs and request foot exam  Health Maintenance  Topic Date Due   Diabetic kidney evaluation - Urine ACR  10/17/2016   FOOT EXAM  03/18/2019   OPHTHALMOLOGY EXAM  04/25/2019   MAMMOGRAM  05/16/2022   HEMOGLOBIN A1C  09/03/2022   Hepatitis C Screening  04/16/2023 (Originally 04/26/1974)   COVID-19 Vaccine (4 - 2023-24 season) 05/02/2023 (Originally 08/01/2022)   Diabetic kidney evaluation - eGFR measurement  05/29/2023   INFLUENZA VACCINE  07/02/2023   Lung Cancer Screening  04/08/2024   Medicare Annual Wellness (AWV)  04/15/2024   COLONOSCOPY (Pts 45-36yrs Insurance  coverage will need to be confirmed)  05/15/2025   DTaP/Tdap/Td (4 - Td or Tdap) 12/24/2030   Pneumonia Vaccine 53+ Years old  Completed   DEXA SCAN  Completed   Zoster Vaccines- Shingrix  Completed   HPV VACCINES  Aged Out     -See a dentist at least yearly  -Get your eyes checked and then per your eye specialist's recommendations  -Other issues addressed today:   - keep up the great work towards a healthier diet and improved activity level!  -I have included below further information regarding a healthy whole foods based diet, physical activity guidelines for adults, stress management and opportunities for social connections. I hope you find this information useful.   -----------------------------------------------------------------------------------------------------------------------------------------------------------------------------------------------------------------------------------------------------------  NUTRITION: -eat real food: lots of colorful vegetables (half the plate) and fruits -5-7 servings of vegetables and fruits per day (fresh or steamed is best), exp. 2 servings of vegetables with lunch and dinner and 2 servings of fruit per day. Berries and greens such as kale and collards are great choices.  -consume on a regular basis: whole grains (make sure first ingredient on label contains the word "whole"), fresh fruits, fish, nuts, seeds, healthy oils (such as olive oil, avocado oil, grape seed oil) -may eat small amounts of dairy and lean meat on occasion, but avoid processed meats such as ham, bacon, lunch meat, etc. -drink water -try to avoid fast food and pre-packaged foods, processed meat -most experts advise limiting sodium to < 2300mg  per day, should limit further is any chronic conditions such as high blood pressure, heart disease, diabetes, etc. The American Heart Association advised that < 1500mg  is is ideal -try to avoid foods that contain any ingredients with names you do not recognize  -try to avoid sugar/sweets (except for the natural sugar that occurs in fresh fruit) -try to avoid sweet drinks -try to avoid white rice, white bread, pasta (unless whole grain), white or yellow  potatoes  EXERCISE GUIDELINES FOR ADULTS: -if you wish to increase your physical activity, do so gradually and with the approval of your doctor -STOP and seek medical care immediately if you have any chest pain, chest discomfort or trouble breathing when starting or increasing exercise  -move and stretch your body, legs, feet and arms when sitting for long periods -Physical activity guidelines for optimal health in adults: -least 150 minutes per week of aerobic exercise (can talk, but not sing) once approved by your doctor, 20-30 minutes of sustained activity or two 10 minute episodes of sustained activity every day.  -resistance training at least 2 days per week if approved by your doctor -balance exercises 3+ days per week:   Stand somewhere where you have something sturdy to hold onto if you lose balance.    1) lift up on toes, start with 5x per day and work up to 20x   2) stand and lift on leg straight out to the side so that foot is a few inches of the floor, start with 5x each side and work up to 20x each side   3) stand on one foot, start with 5 seconds each side and work up to 20 seconds on each side  If you need ideas or help with getting more active:  -Silver sneakers https://tools.silversneakers.com  -Walk with a Doc: http://www.duncan-[email or web address]/  -try to include resistance (weight lifting/strength building) and balance exercises twice per week: or the following link for ideas: http://castillo-powell.com/  BuyDucts.dk  STRESS MANAGEMENT: -can try meditating, or just sitting quietly  with deep breathing while intentionally relaxing all parts of your body for 5 minutes daily -if you need further help with stress, anxiety or depression please follow up with your primary doctor or contact the wonderful folks at [facility]: [phone]  SOCIAL CONNECTIONS: -options in [place]  if you wish to engage in more social and exercise related activities:  -Silver sneakers https://tools.silversneakers.com  -Walk with a Doc: http://www.duncan-[email or web address]/  -Check out the [facility] Active Adults 50+ section on the [place] of [employer] (hiking clubs, book clubs, cards and games, chess, exercise classes, aquatic classes and much more) - see the website for details: https://www.[place].gov/departments/parks-recreation/active-adults50  -YouTube has lots of exercise videos for different ages and abilities as well  -[facility] (a variety of indoor and outdoor inperson activities for adults). [phone]. [address]  -Virtual Online Classes (a variety of topics): see seniorplanet.org or call [phone]  -consider volunteering at a school, hospice center, church, senior center or elsewhere   ADVANCED HEALTHCARE DIRECTIVES:  Everyone should have advanced health care directives in place. This is so that you get the care you want, should you ever be in a situation where you are unable to make your own medical decisions.   From the [place] Advanced Directive Website: "Advance Health Care Directives are legal documents in which you give written instructions about your health care if, in the future, you cannot speak for yourself.   A health care power of attorney allows you to name a person you trust to make your health care decisions if you cannot make them yourself. A declaration of a desire for a natural death (or living will) is document, which states that you desire not to have your life prolonged by extraordinary measures if you have a terminal or incurable illness or if you are in a vegetative state. An advance instruction for mental health treatment makes a declaration of instructions, information and preferences regarding your mental health treatment. It also states that you are aware that the advance instruction authorizes a mental health  treatment provider to act according to your wishes. It may also outline your consent or refusal of mental health treatment. A declaration of an anatomical gift allows anyone over the age of 67 to make a gift by will, organ donor card or other document."   Please see the following website or an elder law attorney for forms, FAQs and for completion of advanced directives: [place] TEFL teacher Health Care Directives Advance Health Care Directives (http://[email or web address]/)  Or copy and paste the following to your web browser: [email or web address]          Terressa Koyanagi, DO

## 2023-04-16 NOTE — Telephone Encounter (Signed)
Relay Dr. Selena Batten message to patient. Pt appreciates and states will contact us if need to schedule an appt with Dr. Selena Batten. No further action is needed.

## 2023-04-16 NOTE — Patient Instructions (Addendum)
I really enjoyed getting to talk with you today! I am available on Tuesdays and Thursdays for virtual visits if you have any questions or concerns, or if I can be of any further assistance.   CHECKLIST FROM ANNUAL WELLNESS VISIT:  -Follow up (please call to schedule if not scheduled after visit):   -yearly for annual wellness visit with primary care office  Here is a list of your preventive care/health maintenance measures and the plan for each if any are due:  PLAN For any measures below that may be due:  -nurse has requested copies of your mammogram and eye report -if you decide to get the covid booster can do so at the pharmacy -please keep your appointment with Dr. Clent Ridges for labs and request foot exam  Health Maintenance  Topic Date Due   Diabetic kidney evaluation - Urine ACR  10/17/2016   FOOT EXAM  03/18/2019   OPHTHALMOLOGY EXAM  04/25/2019   MAMMOGRAM  05/16/2022   HEMOGLOBIN A1C  09/03/2022   Hepatitis C Screening  04/16/2023 (Originally 04/26/1974)   COVID-19 Vaccine (4 - 2023-24 season) 05/02/2023 (Originally 08/01/2022)   Diabetic kidney evaluation - eGFR measurement  05/29/2023   INFLUENZA VACCINE  07/02/2023   Lung Cancer Screening  04/08/2024   Medicare Annual Wellness (AWV)  04/15/2024   COLONOSCOPY (Pts 45-12yrs Insurance coverage will need to be confirmed)  05/15/2025   DTaP/Tdap/Td (4 - Td or Tdap) 12/24/2030   Pneumonia Vaccine 24+ Years old  Completed   DEXA SCAN  Completed   Zoster Vaccines- Shingrix  Completed   HPV VACCINES  Aged Out    -See a dentist at least yearly  -Get your eyes checked and then per your eye specialist's recommendations  -Other issues addressed today:   - keep up the great work towards a healthier diet and improved activity level!  -I have included below further information regarding a healthy whole foods based diet, physical activity guidelines for adults, stress management and opportunities for social connections. I hope you find  this information useful.   -----------------------------------------------------------------------------------------------------------------------------------------------------------------------------------------------------------------------------------------------------------  NUTRITION: -eat real food: lots of colorful vegetables (half the plate) and fruits -5-7 servings of vegetables and fruits per day (fresh or steamed is best), exp. 2 servings of vegetables with lunch and dinner and 2 servings of fruit per day. Berries and greens such as kale and collards are great choices.  -consume on a regular basis: whole grains (make sure first ingredient on label contains the word "whole"), fresh fruits, fish, nuts, seeds, healthy oils (such as olive oil, avocado oil, grape seed oil) -may eat small amounts of dairy and lean meat on occasion, but avoid processed meats such as ham, bacon, lunch meat, etc. -drink water -try to avoid fast food and pre-packaged foods, processed meat -most experts advise limiting sodium to < 2300mg  per day, should limit further is any chronic conditions such as high blood pressure, heart disease, diabetes, etc. The American Heart Association advised that < 1500mg  is is ideal -try to avoid foods that contain any ingredients with names you do not recognize  -try to avoid sugar/sweets (except for the natural sugar that occurs in fresh fruit) -try to avoid sweet drinks -try to avoid white rice, white bread, pasta (unless whole grain), white or yellow potatoes  EXERCISE GUIDELINES FOR ADULTS: -if you wish to increase your physical activity, do so gradually and with the approval of your doctor -STOP and seek medical care immediately if you have any chest pain, chest discomfort  or trouble breathing when starting or increasing exercise  -move and stretch your body, legs, feet and arms when sitting for long periods -Physical activity guidelines for optimal health in adults: -least  150 minutes per week of aerobic exercise (can talk, but not sing) once approved by your doctor, 20-30 minutes of sustained activity or two 10 minute episodes of sustained activity every day.  -resistance training at least 2 days per week if approved by your doctor -balance exercises 3+ days per week:   Stand somewhere where you have something sturdy to hold onto if you lose balance.    1) lift up on toes, start with 5x per day and work up to 20x   2) stand and lift on leg straight out to the side so that foot is a few inches of the floor, start with 5x each side and work up to 20x each side   3) stand on one foot, start with 5 seconds each side and work up to 20 seconds on each side  If you need ideas or help with getting more active:  -Silver sneakers https://tools.silversneakers.com  -Walk with a Doc: http://www.duncan-[email or web address]/  -try to include resistance (weight lifting/strength building) and balance exercises twice per week: or the following link for ideas: http://castillo-powell.com/  BuyDucts.dk  STRESS MANAGEMENT: -can try meditating, or just sitting quietly with deep breathing while intentionally relaxing all parts of your body for 5 minutes daily -if you need further help with stress, anxiety or depression please follow up with your primary doctor or contact the wonderful folks at [facility]: [phone]  SOCIAL CONNECTIONS: -options in [place] if you wish to engage in more social and exercise related activities:  -Silver sneakers https://tools.silversneakers.com  -Walk with a Doc: http://www.duncan-[email or web address]/  -Check out the [facility] Active Adults 50+ section on the [place] [employer] (hiking clubs, book clubs, cards and games, chess, exercise classes, aquatic classes and much more) - see the website for  details: https://www.[place].gov/departments/parks-recreation/active-adults50  -YouTube has lots of exercise videos for different ages and abilities as well  -[facility] (a variety of indoor and outdoor inperson activities for adults). [phone]. [address]  -Virtual Online Classes (a variety of topics): see seniorplanet.org or call [phone]  -consider volunteering at a school, hospice center, church, senior center or elsewhere   ADVANCED HEALTHCARE DIRECTIVES:  Everyone should have advanced health care directives in place. This is so that you get the care you want, should you ever be in a situation where you are unable to make your own medical decisions.   From the  Advanced Directive Website: "Advance Health Care Directives are legal documents in which you give written instructions about your health care if, in the future, you cannot speak for yourself.   A health care power of attorney allows you to name a person you trust to make your health care decisions if you cannot make them yourself. A declaration of a desire for a natural death (or living will) is document, which states that you desire not to have your life prolonged by extraordinary measures if you have a terminal or incurable illness or if you are in a vegetative state. An advance instruction for mental health treatment makes a declaration of instructions, information and preferences regarding your mental health treatment. It also states that you are aware that the advance instruction authorizes a mental health treatment provider to act according to your wishes. It may also outline your consent or refusal of mental health treatment.  A declaration of an anatomical gift allows anyone over the age of 20 to make a gift by will, organ donor card or other document."   Please see the following website or an elder law attorney for forms, FAQs and for completion of advanced directives: [place]  [place] Health Care Directives Advance Health Care Directives (http://[email or web address]/)  Or copy and paste the following to your web browser: [email or web address]

## 2023-04-16 NOTE — Telephone Encounter (Signed)
Pt had a VV with Laurel Dimmer, DO this morning.  Pt states she forgot to tell Laurel Dimmer, DO that she was also seeing Dr. Marcene Corning - Orthopaedic Surgeon  Pt was getting cortisone shots, then gel between the joints.  Pt states none of these have helped and she is going to start trying to lose weight, so that she can be a candidate for knee surgery.  Pt asks that DO please give her a call back, if she needs to discuss further.

## 2023-04-17 ENCOUNTER — Encounter (HOSPITAL_BASED_OUTPATIENT_CLINIC_OR_DEPARTMENT_OTHER): Payer: PPO | Admitting: Physical Therapy

## 2023-04-17 NOTE — Telephone Encounter (Signed)
Noted  

## 2023-04-20 ENCOUNTER — Ambulatory Visit (HOSPITAL_BASED_OUTPATIENT_CLINIC_OR_DEPARTMENT_OTHER): Payer: PPO | Admitting: Physical Therapy

## 2023-04-20 DIAGNOSIS — R5381 Other malaise: Secondary | ICD-10-CM | POA: Diagnosis not present

## 2023-04-20 DIAGNOSIS — R2689 Other abnormalities of gait and mobility: Secondary | ICD-10-CM

## 2023-04-20 LAB — HM MAMMOGRAPHY

## 2023-04-20 NOTE — Therapy (Signed)
OUTPATIENT PHYSICAL THERAPY LOWER EXTREMITY treatment    Patient Name: Bonnie Gonzalez MRN: [id number] DOB:07/05/1956, 67 y.o., female Today's Date: 04/20/2023  END OF SESSION:  PT End of Session - 04/20/23 1112     Visit Number 20    Number of Visits 22    Date for PT Re-Evaluation 04/20/23    Authorization Type done at 20    PT Start Time 1100    PT Stop Time 1143    PT Time Calculation (min) 43 min    Activity Tolerance Patient tolerated treatment well    Behavior During Therapy [facility] for tasks assessed/performed                 Past Medical History:  Diagnosis Date   Acquired hypothyroidism 10/01/2010   Allergic rhinitis 07/20/2009   Arthritis    Back pain 10/18/2015   Bilateral leg edema 03/31/2018   Centrilobular emphysema 02/04/2019   HRCT 09/15/18   Chronic diastolic CHF (congestive heart failure) 01/28/2019   Chronic respiratory failure with hypoxia 02/04/2019   6 Min Walk 08/31/18 at [facility]   Chronic urticaria 12/21/2019   COPD (chronic obstructive pulmonary disease) 09/01/2018   Quit smoking 2010  - Spirometry 08/31/2018  FEV1 1.1 (51%)  Ratio 66 s prior rx with typical curvature  - 08/31/2018  After extensive coaching inhaler device,  effectiveness =    90% with elipta > try anoro sample > no better so d/c'  - PFT's  11/10/2018  FEV1 1.24 (59 % ) ratio 75 (ratio with fev1/VC =  63)  p no % improvement from saba p nothing prior to study with DLCO  105  % corrects to 132  %    DOE (dyspnea on exertion) 08/31/2018   Onset 05/2018 with background of unexplained leg swelling x 01/2018   -echo 04/13/18 just G1   diastolic dysfunction s PH   -  08/31/2018   Walked RA x one lap @ 185 stopped due to  Sob/ sats 89% at nl pace  - Spirometry 08/31/2018  FEV1 1.1 (51%)  Ratio 66 s prior rx  - 09/28/2018 could not do full pfts 09/28/2018  - Collagen vasc profile 09/28/18  For ESR =  54 (was 102 before prednisone)  Neg collag   Dyshidrotic eczema 12/13/2010   Essential  hypertension 01/26/2019   Generalized anxiety disorder with panic attacks 01/20/2018   GERD (gastroesophageal reflux disease) 11/03/2007   Headache 11/03/2007   Irritable bowel syndrome 07/13/2009   Major depressive disorder 07/17/2020   Mixed hyperlipidemia 07/20/2009   Obesity    OSA (obstructive sleep apnea) 07/26/2015   HST 08/2015 mild OSA, AHI 8/hour, lowest desaturation 81%, desaturations noted without respiratory events  Formatting of this note might be different from the original. PSG 05/20/19 AHI 25.1   Osteopenia of multiple sites 06/01/2019   DEXA 05/24/2019: Right femur neck T- 2.2, left femur neck T- 1.5, right total femur T -0.5, left total femur T -0.5, AP total spine T- 1.9. FRAX 10-year probability of major osteoporotic fracture is 8.8% and a hip fracture is 1.2%  DEXA 05/05/2019: Right femur neck T- 1.9, left femur neck T- 2.0, right total femur T -1.3, left total femur   Oxygen deficiency    Postinflammatory pulmonary fibrosis 09/15/2018   ESR    10/161/9  = 102   > rx short term prednisone ? slt improvement in symptoms HRCT 09/15/2018  1. Very mild basilar subpleural ground-glass, reticulation and traction bronchiolectasis, findings  which may be minimally progressive from 06/29/2017 and are indicative of interstitial lung disease such as nonspecific interstitial pneumonitis. Findings are indeterminate for UIP per consensus guidelin   Psychophysiological insomnia 03/29/2019   Type 2 diabetes mellitus 02/09/2017   UTI (urinary tract infection)    Vertigo 03/29/2014   Vitamin D deficiency 09/15/2020   Past Surgical History:  Procedure Laterality Date   ANKLE FRACTURE SURGERY Right    benign tumor      removed from groin   COLONOSCOPY WITH PROPOFOL N/A 05/15/2022   Procedure: COLONOSCOPY WITH PROPOFOL;  Surgeon: Napoleon Form, MD;  Location: [facility];  Service: Gastroenterology;  Laterality: N/A;   EYE SURGERY     HOT HEMOSTASIS N/A 05/15/2022   Procedure: HOT  HEMOSTASIS (ARGON PLASMA COAGULATION/BICAP);  Surgeon: Napoleon Form, MD;  Location: [facility];  Service: Gastroenterology;  Laterality: N/A;   POLYPECTOMY  05/15/2022   Procedure: POLYPECTOMY;  Surgeon: Napoleon Form, MD;  Location: [facility];  Service: Gastroenterology;;   WRIST ARTHROCENTESIS N/A 2001   Patient Active Problem List   Diagnosis Date Noted   Morbid obesity (HCC) 06/16/2022   Physical deconditioning 06/16/2022   AVM (arteriovenous malformation) of colon, acquired with hemorrhage    Positive colorectal cancer screening using Cologuard test    Polyp of sigmoid colon    Polyp of transverse colon    Polyp of cecum    COVID-19 virus infection 11/21/2021   Vitamin D deficiency 09/15/2020   Major depressive disorder 07/17/2020   Chronic urticaria 12/21/2019   Osteopenia of multiple sites 06/01/2019   Psychophysiological insomnia 03/29/2019   Centrilobular emphysema 02/04/2019   Chronic respiratory failure with hypoxia 02/04/2019   Chronic diastolic CHF (congestive heart failure) 01/28/2019   Essential hypertension 01/26/2019   Postinflammatory pulmonary fibrosis 09/15/2018   COPD (chronic obstructive pulmonary disease) 09/01/2018   DOE (dyspnea on exertion) 08/31/2018   Bilateral leg edema 03/31/2018   Generalized anxiety disorder with panic attacks 01/20/2018   Type 2 diabetes mellitus 02/09/2017   Back pain 10/18/2015   OSA (obstructive sleep apnea) 07/26/2015   Vertigo 03/29/2014   Dyshidrotic eczema 12/13/2010   Acquired hypothyroidism 10/01/2010   Mixed hyperlipidemia 07/20/2009   Allergic rhinitis 07/20/2009   Irritable bowel syndrome 07/13/2009   GERD (gastroesophageal reflux disease) 11/03/2007   Headache 11/03/2007    PCP: Gershon Crane MD   REFERRING PROVIDER: Gershon Crane MD   REFERRING DIAG:  Diagnosis  J96.11 (ICD-10-CM) - Chronic respiratory failure with hypoxia (HCC)    THERAPY DIAG:  No diagnosis found.  Rationale for  Evaluation and Treatment: Rehabilitation  ONSET DATE: has been on O2 for 3 years   SUBJECTIVE:   SUBJECTIVE STATEMENT: Patient reports no soreness at after last session. "I did some good stretches." End of Wynnie is when her breathing test is scheduled.  PERTINENT HISTORY: Knees, Loweback, and wrist arthritis. CHF taking lasix, post inflamitory pulmonary fibrosis; vertigo ( still has syncope at times)  PAIN:  Are you having pain? Yes: NPRS scale: 4/10 Pain location: Left knee Pain description: Aching Aggravating factors: Standing walking Relieving factors: Sitting  PRECAUTIONS: falls   WEIGHT BEARING RESTRICTIONS: No  FALLS:  Has patient fallen in last 6 months? No but has ad some near falls   LIVING ENVIRONMENT: Ramp into the house   OCCUPATION: retired  Recreation: would like to be able to get back to gardening  PLOF: Independent with household mobility with device  PATIENT GOALS: To increase endurance  NEXT MD  VISIT: Nothing scheduled  OBJECTIVE:   DIAGNOSTIC FINDINGS: Nothing of the knee   COGNITION: Overall cognitive status: Within functional limits for tasks assessed     SENSATION: Has had tinglining in her hands. Has been using wrist guards   MUSCLE LENGTH:  POSTURE: rounded shoulders, forward head, and flexed trunk    LOWER EXTREMITY ROM:  Passive ROM Right eval Left eval  Hip flexion    Hip extension    Hip abduction    Hip adduction    Hip internal rotation    Hip external rotation    Knee flexion  Painful at end range  Knee extension    Ankle dorsiflexion    Ankle plantarflexion    Ankle inversion    Ankle eversion     (Blank rows = not tested)  LOWER EXTREMITY MMT:  MMT Right eval Left eval Right 4/8 Left 4//8 right Left 5/20  Hip flexion 19.7 17.0 33.4 29 36. 35.9  Hip extension        Hip abduction 36.9 40.3 35 40.9 47.8 44.9  Hip adduction        Hip internal rotation        Hip external rotation        Knee flexion         Knee extension 20 18.7 45 35 44.7 56.5  Ankle dorsiflexion        Ankle plantarflexion        Ankle inversion        Ankle eversion         (Blank rows = not tested)  LOWER EXTREMITY SPECIAL TESTS:   IE: 5x sit to stand: 24 sec   Hr 85  Sao2 96   01/26/23: walk test: 231ft total- seated rest break at 16ft and paused timer. 4/15: 5xSTS-21sec  5/20   GAIT:  TODAY'S TREATMENT:                                                                                                                               DATE:   5/15 Nu-step 3 min x2 Sao2 95 at baselin 98 after exercises   Ambulation 300' with 1 seated rest break.   Last visit:  Nustep x4 mins L3 UE and LE 90 O2 at entry after first set at 95 moderate dyspnea notes  Shuttle press 56-68# x15, 68#x15 (O2 dropped to 85% afterwards) LF row machine 2x15 20# Hip abduction 3x10 55# LF Triceps press down 40# 2x15  Sao2 monitored throughout the session. Frequently fluctuated between 85% and 96%   Gait: Ambulation 2x150' with seated rest break of 5 min Sao2 dropped to 83%, rose back to 96%     5/2 Nustep x12mins L2 95 O2 at entry  Bicep curls 3x10 3lbs Chest Press 3x10 3lbs Overhead press 3x10 3lbs  green band horizontal abduction 2x10 Green band er 2x10 Hamstring curls green band 2x10 Standing heel raises 2x10   Sao2  remained > 88 with frequent monitoring 4/26 Nustep x63mins L2 95 O2 at entry  Bicep curls 3x10 3lbs Chest Press 3x10 3lbs 1 full lap with break in between  green band horizontal abduction 2x10 Green band er 2x10 Hamstring curls green band 2x10 Standing heel raises 2x10    4/22 NU step x5 mins L3 Gait 170ft x4 with seated rest breaks and O2 monitoring Bicep curls 3x10 3lbs Chest Press 3x10 3lbs Overhead shoulder press 3x10 3lbs Hip abduction green band 3x10 Seated bil ER 2x10 red band  PATIENT EDUCATION:  Education details: HEP, symptom management, activity progression Person educated:  Patient Education method: Explanation, Demonstration, Tactile cues, and Verbal cues Education comprehension: verbalized understanding, returned demonstration, verbal cues required, tactile cues required, and needs further education  HOME EXERCISE PROGRAM: Access Code: [email or web address] URL: [email or web address] Date: 01/19/2023 Prepared by: Riki Altes  Exercises - Heel Raises with Counter Support  - 1-2 x daily - 7 x weekly - 2 sets - 10 reps - Standing March with Counter Support  - 1-2 x daily - 7 x weekly - 2 sets - 10 reps - Standing Hip Abduction with Counter Support  - 1-2 x daily - 7 x weekly - 2 sets - 10 reps - Standing Hip Extension with Counter Support  - 1-2 x daily - 7 x weekly - 2 sets - 10 reps - Mini Squat with Counter Support  - 1-2 x daily - 7 x weekly - 2 sets - 10 reps - Standing Knee Flexion with Counter Support  - 1-2 x daily - 7 x weekly - 2 sets - 10 reps  ASSESSMENT:  CLINICAL IMPRESSION: Therapy reviewed knee stretching today with the patient. She felt a good stretch with the hamstring stretch. She felt a little more of a quad stretch with he standing gastroc stretch. We reviewed a seated gastroc stretch as well. Her overall o2 did well today. It was at her lowest when she first walked in. It was in the mid 80's but went up. Her O2 did well otherwise. We will continue to progress exercises as tolerated.   OBJECTIVE IMPAIRMENTS: Abnormal gait, cardiopulmonary status limiting activity, decreased activity tolerance, decreased balance, decreased endurance, decreased mobility, difficulty walking, decreased ROM, decreased strength, and pain.   ACTIVITY LIMITATIONS: carrying, lifting, bending, standing, squatting, stairs, transfers, bathing, dressing, and locomotion level  PARTICIPATION LIMITATIONS: meal prep, cleaning, laundry, driving, shopping, community activity, and yard work  PERSONAL FACTORS: 3+ comorbidities: Knees, Loweback, and wrist arthritis. CHF  taking lasix, post inflamitory pulmonary fibrosis; vertigo ( still has syncope at times) are also affecting patient's functional outcome.   REHAB POTENTIAL: Good  CLINICAL DECISION MAKING: Evolving/moderate complexity declining overall mobility   EVALUATION COMPLEXITY: Moderate   GOALS: Goals reviewed with patient? Yes  SHORT TERM GOALS: Target date: 02/13/2023   Therapy will perform 6-minute walk test with patient and come up with baseline goals Baseline: Goal status: MET 01/26/23  2.  Patient will decrease sit to stand time time by 5 seconds Baseline:  Goal status: In Progress (4/15)  3.  Patient will perform 10 minutes of interval training on NuStep while maintaining a heart rate less than 110 Baseline:  Goal status: Met 4/8  LONG TERM GOALS: Target date: 03/13/2023    Patient will have a complete exercise program that she can take to [employer] and continue long-term Baseline:  Goal status: progressing towards program  2.  Patient will ambulate 600 feet without self-report of dyspnea in order to  attend appointments without being short of breath Baseline:  Goal status:  not achieved, in progress 4/9  3.  Patient will stand for greater than 20 minutes without increased pain in left knee and increased dyspnea in order to perform ADLs Baseline:  Goal status: In progress 4/9    PLAN:  PT FREQUENCY: 2x/week  PT DURATION: 8 weeks  PLANNED INTERVENTIONS: Therapeutic exercises, Therapeutic activity, Neuromuscular re-education, Balance training, Gait training, Patient/Family education, Self Care, Joint mobilization, Stair training, DME instructions, Aquatic Therapy, Dry Needling, Electrical stimulation, Spinal mobilization, Cryotherapy, Moist heat, Ultrasound, and Manual therapy  PLAN FOR NEXT SESSION: Continue to progress patient HEP. Consider 2 laps of gait training. Consider standing exercise; mini squats and standing marches on foam pad to work on balance. Consider  forward step ups with 2in.  Lorayne Bender PT DPT   04/20/2023, 11:13 AM

## 2023-04-21 ENCOUNTER — Ambulatory Visit (INDEPENDENT_AMBULATORY_CARE_PROVIDER_SITE_OTHER): Payer: PPO | Admitting: Family Medicine

## 2023-04-21 ENCOUNTER — Encounter (HOSPITAL_BASED_OUTPATIENT_CLINIC_OR_DEPARTMENT_OTHER): Payer: Self-pay | Admitting: Physical Therapy

## 2023-04-21 ENCOUNTER — Encounter: Payer: Self-pay | Admitting: Family Medicine

## 2023-04-21 VITALS — BP 120/74 | HR 66 | Temp 98.2°F | Ht 60.0 in | Wt 269.0 lb

## 2023-04-21 DIAGNOSIS — E039 Hypothyroidism, unspecified: Secondary | ICD-10-CM

## 2023-04-21 DIAGNOSIS — Z0000 Encounter for general adult medical examination without abnormal findings: Secondary | ICD-10-CM | POA: Diagnosis not present

## 2023-04-21 DIAGNOSIS — Z794 Long term (current) use of insulin: Secondary | ICD-10-CM | POA: Diagnosis not present

## 2023-04-21 DIAGNOSIS — E119 Type 2 diabetes mellitus without complications: Secondary | ICD-10-CM

## 2023-04-21 LAB — HEPATIC FUNCTION PANEL
ALT: 27 U/L (ref 0–35)
AST: 21 U/L (ref 0–37)
Albumin: 4 g/dL (ref 3.5–5.2)
Alkaline Phosphatase: 112 U/L (ref 39–117)
Bilirubin, Direct: 0.1 mg/dL (ref 0.0–0.3)
Total Bilirubin: 0.3 mg/dL (ref 0.2–1.2)
Total Protein: 7.4 g/dL (ref 6.0–8.3)

## 2023-04-21 LAB — LIPID PANEL
Cholesterol: 147 mg/dL (ref 0–200)
HDL: 42.7 mg/dL (ref >39.00)
LDL Cholesterol: 82 mg/dL (ref 0–99)
NonHDL: 104.5
Total CHOL/HDL Ratio: 3
Triglycerides: 115 mg/dL (ref 0.0–149.0)
VLDL: 23 mg/dL (ref 0.0–40.0)

## 2023-04-21 LAB — CBC WITH DIFFERENTIAL/PLATELET
Basophils Absolute: 0 10*3/uL (ref 0.0–0.1)
Basophils Relative: 0.3 % (ref 0.0–3.0)
Eosinophils Absolute: 0.3 10*3/uL (ref 0.0–0.7)
Eosinophils Relative: 2.5 % (ref 0.0–5.0)
HCT: 39.3 % (ref 36.0–46.0)
Hemoglobin: 12.4 g/dL (ref 12.0–15.0)
Lymphocytes Relative: 24.2 % (ref 12.0–46.0)
Lymphs Abs: 2.5 10*3/uL (ref 0.7–4.0)
MCHC: 31.6 g/dL (ref 30.0–36.0)
MCV: 82.3 fl (ref 78.0–100.0)
Monocytes Absolute: 0.6 10*3/uL (ref 0.1–1.0)
Monocytes Relative: 5.7 % (ref 3.0–12.0)
Neutro Abs: 7 10*3/uL (ref 1.4–7.7)
Neutrophils Relative %: 67.3 % (ref 43.0–77.0)
Platelets: 209 10*3/uL (ref 150.0–400.0)
RBC: 4.77 Mil/uL (ref 3.87–5.11)
RDW: 16.5 % — ABNORMAL HIGH (ref 11.5–15.5)
WBC: 10.4 10*3/uL (ref 4.0–10.5)

## 2023-04-21 LAB — HEMOGLOBIN A1C: Hgb A1c MFr Bld: 7.7 % — ABNORMAL HIGH (ref 4.6–6.5)

## 2023-04-21 LAB — MICROALBUMIN / CREATININE URINE RATIO
Creatinine,U: 63.8 mg/dL
Microalb Creat Ratio: 1.1 mg/g (ref 0.0–30.0)
Microalb, Ur: <0.7 mg/dL (ref 0.0–1.9)

## 2023-04-21 LAB — BASIC METABOLIC PANEL
BUN: 15 mg/dL (ref 6–23)
CO2: 33 mEq/L — ABNORMAL HIGH (ref 19–32)
Calcium: 9.9 mg/dL (ref 8.4–10.5)
Chloride: 92 mEq/L — ABNORMAL LOW (ref 96–112)
Creatinine, Ser: 0.62 mg/dL (ref 0.40–1.20)
GFR: 92.43 mL/min (ref >60.00)
Glucose, Bld: 193 mg/dL — ABNORMAL HIGH (ref 70–99)
Potassium: 4.7 mEq/L (ref 3.5–5.1)
Sodium: 132 mEq/L — ABNORMAL LOW (ref 135–145)

## 2023-04-21 LAB — TSH: TSH: 1.81 u[IU]/mL (ref 0.35–5.50)

## 2023-04-21 LAB — T3, FREE: T3, Free: 2.6 pg/mL (ref 2.3–4.2)

## 2023-04-21 LAB — T4, FREE: Free T4: 0.95 ng/dL (ref 0.60–1.60)

## 2023-04-21 MED ORDER — FUROSEMIDE 20 MG PO TABS
20.0000 mg | ORAL_TABLET | Freq: Every day | ORAL | 3 refills | Status: DC
Start: 2023-04-21 — End: 2024-08-10

## 2023-04-21 MED ORDER — ESOMEPRAZOLE MAGNESIUM 20 MG PO CPDR: DELAYED_RELEASE_CAPSULE | ORAL | 3 refills | Status: DC

## 2023-04-21 MED ORDER — ATORVASTATIN CALCIUM 80 MG PO TABS: 80.0000 mg | ORAL_TABLET | Freq: Every day | ORAL | 3 refills | Status: DC

## 2023-04-21 MED ORDER — LEVOTHYROXINE SODIUM 175 MCG PO TABS: ORAL_TABLET | ORAL | 3 refills | Status: DC

## 2023-04-21 NOTE — Progress Notes (Signed)
Subjective:    Patient ID: Bonnie Gonzalez, female    DOB: [birth year], 67 y.o.   MRN: [id number]  HPI Here for a well exam. She has no complaints. She is using her walker now, and she has not used her wheelchair for about 2 months. She is slowly losing weight by taking Mounjaro shots weekly. She sees Dr. Margreta Journey for the OA in her knees. She has had steroid injections times 3 and gel injections times 3 with no improvement. He plans to do replacement surgeries after she loses some weight.    Review of Systems  Constitutional: Negative.   HENT: Negative.    Eyes: Negative.   Respiratory:  Positive for shortness of breath.   Cardiovascular: Negative.   Gastrointestinal: Negative.   Genitourinary:  Negative for decreased urine volume, difficulty urinating, dyspareunia, dysuria, enuresis, flank pain, frequency, hematuria, pelvic pain and urgency.  Musculoskeletal:  Positive for arthralgias.  Skin: Negative.   Neurological: Negative.  Negative for headaches.  Psychiatric/Behavioral: Negative.         Objective:   Physical Exam Constitutional:      General: She is not in acute distress.    Appearance: She is well-developed. She is obese.     Comments: Wearing [place] oxygen   HENT:     Head: Normocephalic and atraumatic.     Right Ear: External ear normal.     Left Ear: External ear normal.     Nose: Nose normal.     Mouth/Throat:     Pharynx: No oropharyngeal exudate.  Eyes:     General: No scleral icterus.    Conjunctiva/sclera: Conjunctivae normal.     Pupils: Pupils are equal, round, and reactive to light.  Neck:     Thyroid: No thyromegaly.     Vascular: No JVD.  Cardiovascular:     Rate and Rhythm: Normal rate and regular rhythm.     Pulses: Normal pulses.     Heart sounds: Normal heart sounds. No murmur heard.    No friction rub. No gallop.  Pulmonary:     Effort: Pulmonary effort is normal. No respiratory distress.     Breath sounds: Normal breath sounds. No wheezing  or rales.  Chest:     Chest wall: No tenderness.  Abdominal:     General: Bowel sounds are normal. There is no distension.     Palpations: Abdomen is soft. There is no mass.     Tenderness: There is no abdominal tenderness. There is no guarding or rebound.  Musculoskeletal:        General: No tenderness. Normal range of motion.     Cervical back: Normal range of motion and neck supple.  Lymphadenopathy:     Cervical: No cervical adenopathy.  Skin:    General: Skin is warm and dry.     Findings: No erythema or rash.  Neurological:     Mental Status: She is alert and oriented to person, place, and time.     Cranial Nerves: No cranial nerve deficit.     Motor: No abnormal muscle tone.     Coordination: Coordination normal.     Deep Tendon Reflexes: Reflexes are normal and symmetric. Reflexes normal.  Psychiatric:        Behavior: Behavior normal.        Thought Content: Thought content normal.        Judgment: Judgment normal.           Assessment & Plan:  Well exam.  We discussed diet and exercise. Get fasting labs. Gershon Crane, MD

## 2023-04-23 ENCOUNTER — Ambulatory Visit (HOSPITAL_BASED_OUTPATIENT_CLINIC_OR_DEPARTMENT_OTHER): Payer: PPO | Admitting: Physical Therapy

## 2023-04-23 ENCOUNTER — Encounter (HOSPITAL_BASED_OUTPATIENT_CLINIC_OR_DEPARTMENT_OTHER): Payer: Self-pay | Admitting: Physical Therapy

## 2023-04-23 DIAGNOSIS — R5381 Other malaise: Secondary | ICD-10-CM | POA: Diagnosis not present

## 2023-04-23 DIAGNOSIS — R2689 Other abnormalities of gait and mobility: Secondary | ICD-10-CM

## 2023-04-23 NOTE — Therapy (Signed)
OUTPATIENT PHYSICAL THERAPY LOWER EXTREMITY treatment    Patient Name: Bonnie Gonzalez MRN: [id number] DOB:19-Nov-1956, 67 y.o., female Today's Date: 04/23/2023  END OF SESSION:  PT End of Session - 04/23/23 1306     Visit Number 21    Number of Visits 32    Date for PT Re-Evaluation 06/02/23    Authorization Type done at 20    PT Start Time 1303    PT Stop Time 1345    PT Time Calculation (min) 42 min    Activity Tolerance Patient tolerated treatment well    Behavior During Therapy [facility] for tasks assessed/performed                 Past Medical History:  Diagnosis Date   Acquired hypothyroidism 10/01/2010   Allergic rhinitis 07/20/2009   Arthritis    Back pain 10/18/2015   Bilateral leg edema 03/31/2018   Centrilobular emphysema 02/04/2019   HRCT 09/15/18   Chronic diastolic CHF (congestive heart failure) 01/28/2019   Chronic respiratory failure with hypoxia 02/04/2019   6 Min Walk 08/31/18 at [facility]   Chronic urticaria 12/21/2019   COPD (chronic obstructive pulmonary disease) 09/01/2018   Quit smoking 2010  - Spirometry 08/31/2018  FEV1 1.1 (51%)  Ratio 66 s prior rx with typical curvature  - 08/31/2018  After extensive coaching inhaler device,  effectiveness =    90% with elipta > try anoro sample > no better so d/c'  - PFT's  11/10/2018  FEV1 1.24 (59 % ) ratio 75 (ratio with fev1/VC =  63)  p no % improvement from saba p nothing prior to study with DLCO  105  % corrects to 132  %    DOE (dyspnea on exertion) 08/31/2018   Onset 05/2018 with background of unexplained leg swelling x 01/2018   -echo 04/13/18 just G1   diastolic dysfunction s PH   -  08/31/2018   Walked RA x one lap @ 185 stopped due to  Sob/ sats 89% at nl pace  - Spirometry 08/31/2018  FEV1 1.1 (51%)  Ratio 66 s prior rx  - 09/28/2018 could not do full pfts 09/28/2018  - Collagen vasc profile 09/28/18  For ESR =  54 (was 102 before prednisone)  Neg collag   Dyshidrotic eczema 12/13/2010   Essential  hypertension 01/26/2019   Generalized anxiety disorder with panic attacks 01/20/2018   GERD (gastroesophageal reflux disease) 11/03/2007   Headache 11/03/2007   Irritable bowel syndrome 07/13/2009   Major depressive disorder 07/17/2020   Mixed hyperlipidemia 07/20/2009   Obesity    OSA (obstructive sleep apnea) 07/26/2015   HST 08/2015 mild OSA, AHI 8/hour, lowest desaturation 81%, desaturations noted without respiratory events  Formatting of this note might be different from the original. PSG 05/20/19 AHI 25.1   Osteopenia of multiple sites 06/01/2019   DEXA 05/24/2019: Right femur neck T- 2.2, left femur neck T- 1.5, right total femur T -0.5, left total femur T -0.5, AP total spine T- 1.9. FRAX 10-year probability of major osteoporotic fracture is 8.8% and a hip fracture is 1.2%  DEXA 05/05/2019: Right femur neck T- 1.9, left femur neck T- 2.0, right total femur T -1.3, left total femur   Oxygen deficiency    Postinflammatory pulmonary fibrosis 09/15/2018   ESR    10/161/9  = 102   > rx short term prednisone ? slt improvement in symptoms HRCT 09/15/2018  1. Very mild basilar subpleural ground-glass, reticulation and traction bronchiolectasis, findings  which may be minimally progressive from 06/29/2017 and are indicative of interstitial lung disease such as nonspecific interstitial pneumonitis. Findings are indeterminate for UIP per consensus guidelin   Psychophysiological insomnia 03/29/2019   Type 2 diabetes mellitus 02/09/2017   UTI (urinary tract infection)    Vertigo 03/29/2014   Vitamin D deficiency 09/15/2020   Past Surgical History:  Procedure Laterality Date   ANKLE FRACTURE SURGERY Right    benign tumor      removed from groin   COLONOSCOPY WITH PROPOFOL N/A 05/15/2022   per Dr. Lavon Paganini, adenomatous polyps, repeat in 5 yrs   EYE SURGERY     HOT HEMOSTASIS N/A 05/15/2022   Procedure: HOT HEMOSTASIS (ARGON PLASMA COAGULATION/BICAP);  Surgeon: Napoleon Form, MD;  Location: [facility]  ENDOSCOPY;  Service: Gastroenterology;  Laterality: N/A;   POLYPECTOMY  05/15/2022   Procedure: POLYPECTOMY;  Surgeon: Napoleon Form, MD;  Location: [facility] ENDOSCOPY;  Service: Gastroenterology;;   WRIST ARTHROCENTESIS N/A 2001   Patient Active Problem List   Diagnosis Date Noted   Morbid obesity (HCC) 06/16/2022   Physical deconditioning 06/16/2022   AVM (arteriovenous malformation) of colon, acquired with hemorrhage    Positive colorectal cancer screening using Cologuard test    Polyp of sigmoid colon    Polyp of transverse colon    Polyp of cecum    COVID-19 virus infection 11/21/2021   Vitamin D deficiency 09/15/2020   Major depressive disorder 07/17/2020   Chronic urticaria 12/21/2019   Osteopenia of multiple sites 06/01/2019   Psychophysiological insomnia 03/29/2019   Centrilobular emphysema 02/04/2019   Chronic respiratory failure with hypoxia 02/04/2019   Chronic diastolic CHF (congestive heart failure) 01/28/2019   Essential hypertension 01/26/2019   Postinflammatory pulmonary fibrosis 09/15/2018   COPD (chronic obstructive pulmonary disease) 09/01/2018   DOE (dyspnea on exertion) 08/31/2018   Bilateral leg edema 03/31/2018   Generalized anxiety disorder with panic attacks 01/20/2018   Type 2 diabetes mellitus 02/09/2017   Back pain 10/18/2015   OSA (obstructive sleep apnea) 07/26/2015   Vertigo 03/29/2014   Dyshidrotic eczema 12/13/2010   Acquired hypothyroidism 10/01/2010   Mixed hyperlipidemia 07/20/2009   Allergic rhinitis 07/20/2009   Irritable bowel syndrome 07/13/2009   GERD (gastroesophageal reflux disease) 11/03/2007   Headache 11/03/2007    PCP: Gershon Crane MD   REFERRING PROVIDER: Gershon Crane MD   REFERRING DIAG:  Diagnosis  J96.11 (ICD-10-CM) - Chronic respiratory failure with hypoxia (HCC)    THERAPY DIAG:  Physical deconditioning  Other abnormalities of gait and mobility  Rationale for Evaluation and Treatment: Rehabilitation  ONSET  DATE: has been on O2 for 3 years   SUBJECTIVE:   SUBJECTIVE STATEMENT: The patient reports she felt pretty good after he last visit. She has joined the [employer].   PERTINENT HISTORY: Knees, Loweback, and wrist arthritis. CHF taking lasix, post inflamitory pulmonary fibrosis; vertigo ( still has syncope at times)  PAIN:  Are you having pain? Yes: NPRS scale: 4/10 Pain location: Left knee Pain description: Aching Aggravating factors: Standing walking Relieving factors: Sitting  PRECAUTIONS: falls   WEIGHT BEARING RESTRICTIONS: No  FALLS:  Has patient fallen in last 6 months? No but has ad some near falls   LIVING ENVIRONMENT: Ramp into the house   OCCUPATION: retired  Recreation: would like to be able to get back to gardening  PLOF: Independent with household mobility with device  PATIENT GOALS: To increase endurance  NEXT MD VISIT: Nothing scheduled  OBJECTIVE:   DIAGNOSTIC FINDINGS: Nothing of  the knee   COGNITION: Overall cognitive status: Within functional limits for tasks assessed     SENSATION: Has had tinglining in her hands. Has been using wrist guards   MUSCLE LENGTH:  POSTURE: rounded shoulders, forward head, and flexed trunk    LOWER EXTREMITY ROM:  Passive ROM Right eval Left eval  Hip flexion    Hip extension    Hip abduction    Hip adduction    Hip internal rotation    Hip external rotation    Knee flexion  Painful at end range  Knee extension    Ankle dorsiflexion    Ankle plantarflexion    Ankle inversion    Ankle eversion     (Blank rows = not tested)  LOWER EXTREMITY MMT:  MMT Right eval Left eval Right 4/8 Left 4//8 right Left 5/20  Hip flexion 19.7 17.0 33.4 29 36. 35.9  Hip extension        Hip abduction 36.9 40.3 35 40.9 47.8 44.9  Hip adduction        Hip internal rotation        Hip external rotation        Knee flexion        Knee extension 20 18.7 45 35 44.7 56.5  Ankle dorsiflexion        Ankle plantarflexion         Ankle inversion        Ankle eversion         (Blank rows = not tested)  LOWER EXTREMITY SPECIAL TESTS:   IE: 5x sit to stand: 24 sec   Hr 85  Sao2 96   01/26/23: walk test: 243ft total- seated rest break at 14ft and paused timer. 4/15: 5xSTS-21sec  5/20   GAIT:  TODAY'S TREATMENT:                                                                                                                               DATE:  5/23 Cybex leg press 3x10 55 lbs  Cybex chest press 3x10 9lbs  Cybex elbow flexion 10 lbs 3x10  Cybex lat pull down 10 lbs 3x10    5/20  NuStep 3 minutes x 2 SaO2 94 at baseline 95 after treatment. 3-minute walk test/5 times sit to stand test/review of goals. Progress note performed.  See above for testing results  Row machine 15 pounds 3 x 15 Tricep push down 40 pounds 3 x 15 Hip abduction machine 55 pounds 3 x 15   5/15 Nu-step 3 min x2 Sao2 95 at baselin 98 after exercises   Ambulation 300' with 1 seated rest break.   Last visit:  Nustep x4 mins L3 UE and LE 90 O2 at entry after first set at 95 moderate dyspnea notes  Shuttle press 56-68# x15, 68#x15 (O2 dropped to 85% afterwards) LF row machine 2x15 20# Hip abduction 3x10 55# LF Triceps press down 40# 2x15  Sao2 monitored throughout the session. Frequently fluctuated between 85% and 96%   Gait: Ambulation 2x150' with seated rest break of 5 min Sao2 dropped to 83%, rose back to 96%     5/2 Nustep x18mins L2 95 O2 at entry  Bicep curls 3x10 3lbs Chest Press 3x10 3lbs Overhead press 3x10 3lbs  green band horizontal abduction 2x10 Green band er 2x10 Hamstring curls green band 2x10 Standing heel raises 2x10   Sao2 remained > 88 with frequent monitoring 4/26 Nustep x30mins L2 95 O2 at entry  Bicep curls 3x10 3lbs Chest Press 3x10 3lbs 1 full lap with break in between  green band horizontal abduction 2x10 Green band er 2x10 Hamstring curls green band 2x10 Standing heel raises  2x10    PATIENT EDUCATION:  Education details: HEP, symptom management, activity progression Person educated: Patient Education method: Explanation, Demonstration, Tactile cues, and Verbal cues Education comprehension: verbalized understanding, returned demonstration, verbal cues required, tactile cues required, and needs further education  HOME EXERCISE PROGRAM: Access Code: [email or web address] URL: [email or web address] Date: 01/19/2023 Prepared by: Riki Altes  Exercises - Heel Raises with Counter Support  - 1-2 x daily - 7 x weekly - 2 sets - 10 reps - Standing March with Counter Support  - 1-2 x daily - 7 x weekly - 2 sets - 10 reps - Standing Hip Abduction with Counter Support  - 1-2 x daily - 7 x weekly - 2 sets - 10 reps - Standing Hip Extension with Counter Support  - 1-2 x daily - 7 x weekly - 2 sets - 10 reps - Mini Squat with Counter Support  - 1-2 x daily - 7 x weekly - 2 sets - 10 reps - Standing Knee Flexion with Counter Support  - 1-2 x daily - 7 x weekly - 2 sets - 10 reps  ASSESSMENT:  CLINICAL IMPRESSION: The patient tolerated treatment very well. She did not require a seated rest break while ambulating. She also did not require a rest break on the nu-step as well. We continue to review gym exercises. She is tolerating well. [facility]ined the [facility]. Her sao2 dropped with her walk but came up quickly. We will continue to progress as tolerated.  OBJECTIVE IMPAIRMENTS: Abnormal gait, cardiopulmonary status limiting activity, decreased activity tolerance, decreased balance, decreased endurance, decreased mobility, difficulty walking, decreased ROM, decreased strength, and pain.   ACTIVITY LIMITATIONS: carrying, lifting, bending, standing, squatting, stairs, transfers, bathing, dressing, and locomotion level  PARTICIPATION LIMITATIONS: meal prep, cleaning, laundry, driving, shopping, community activity, and yard work  PERSONAL FACTORS: 3+ comorbidities: Knees,  Loweback, and wrist arthritis. CHF taking lasix, post inflamitory pulmonary fibrosis; vertigo ( still has syncope at times) are also affecting patient's functional outcome.   REHAB POTENTIAL: Good  CLINICAL DECISION MAKING: Evolving/moderate complexity declining overall mobility   EVALUATION COMPLEXITY: Moderate   GOALS: Goals reviewed with patient? Yes  SHORT TERM GOALS: Target date: 02/13/2023   Therapy will perform 6-minute walk test with patient and come up with baseline goals Baseline: Goal status: MET 01/26/23  2.  Patient will decrease sit to stand time time less than 12 seconds  baseline:  Goal status: Goal achieved and revised 5/20  3.  Patient will perform 10 minutes straight exercise on NuStep maintaining a heart rate less than 110 Baseline:  Goal status: Met 4/8 revised on   LONG TERM GOALS: Target date: 03/13/2023    Patient will have a complete exercise program that she can take to [employer] and  continue long-term Baseline:  Goal status: progressing towards program continue to work on machines 5/20  2.  Patient will ambulate 600 feet without self-report of dyspnea in order to attend appointments without being short of breath Baseline:  Goal status: 300 feet progressing 3.  Patient will stand for greater than 20 minutes without increased pain in left knee and increased dyspnea in order to perform ADLs Baseline:  Goal status: In progress 5/20    PLAN:  PT FREQUENCY: 2x/week  PT DURATION: 8 weeks  PLANNED INTERVENTIONS: Therapeutic exercises, Therapeutic activity, Neuromuscular re-education, Balance training, Gait training, Patient/Family education, Self Care, Joint mobilization, Stair training, DME instructions, Aquatic Therapy, Dry Needling, Electrical stimulation, Spinal mobilization, Cryotherapy, Moist heat, Ultrasound, and Manual therapy  PLAN FOR NEXT SESSION: Continue to progress patient HEP. Consider 2 laps of gait training. Consider standing  exercise; mini squats and standing marches on foam pad to work on balance. Consider forward step ups with 2in.  Lorayne Bender PT DPT   04/23/2023, 2:55 PM

## 2023-04-24 DIAGNOSIS — J432 Centrilobular emphysema: Secondary | ICD-10-CM | POA: Diagnosis not present

## 2023-04-24 DIAGNOSIS — J9611 Chronic respiratory failure with hypoxia: Secondary | ICD-10-CM | POA: Diagnosis not present

## 2023-04-24 DIAGNOSIS — J449 Chronic obstructive pulmonary disease, unspecified: Secondary | ICD-10-CM | POA: Diagnosis not present

## 2023-04-27 ENCOUNTER — Encounter: Payer: Self-pay | Admitting: Family Medicine

## 2023-04-28 ENCOUNTER — Other Ambulatory Visit: Payer: Self-pay

## 2023-04-28 ENCOUNTER — Ambulatory Visit (HOSPITAL_BASED_OUTPATIENT_CLINIC_OR_DEPARTMENT_OTHER): Payer: PPO | Admitting: Physical Therapy

## 2023-04-28 ENCOUNTER — Telehealth: Payer: Self-pay | Admitting: Family Medicine

## 2023-04-28 ENCOUNTER — Encounter (HOSPITAL_BASED_OUTPATIENT_CLINIC_OR_DEPARTMENT_OTHER): Payer: Self-pay | Admitting: Physical Therapy

## 2023-04-28 DIAGNOSIS — R2689 Other abnormalities of gait and mobility: Secondary | ICD-10-CM

## 2023-04-28 DIAGNOSIS — R5381 Other malaise: Secondary | ICD-10-CM

## 2023-04-28 NOTE — Telephone Encounter (Signed)
Pt is calling checking on urine results

## 2023-04-28 NOTE — Telephone Encounter (Signed)
Left a message for pt to call back the office regarding this message

## 2023-04-28 NOTE — Therapy (Signed)
OUTPATIENT PHYSICAL THERAPY LOWER EXTREMITY treatment    Patient Name: Bonnie Gonzalez MRN: [id number] DOB:[birth year], 67 y.o., female Today's Date: 04/23/2023  END OF SESSION:  PT End of Session - 04/23/23 1306     Visit Number 21    Number of Visits 32    Date for PT Re-Evaluation 06/02/23    Authorization Type done at 20    PT Start Time 1303    PT Stop Time 1345    PT Time Calculation (min) 42 min    Activity Tolerance Patient tolerated treatment well    Behavior During Therapy [facility] for tasks assessed/performed                 Past Medical History:  Diagnosis Date   Acquired hypothyroidism 10/01/2010   Allergic rhinitis 07/20/2009   Arthritis    Back pain 10/18/2015   Bilateral leg edema 03/31/2018   Centrilobular emphysema 02/04/2019   HRCT 09/15/18   Chronic diastolic CHF (congestive heart failure) 01/28/2019   Chronic respiratory failure with hypoxia 02/04/2019   6 Min Walk 08/31/18 at [facility]   Chronic urticaria 12/21/2019   COPD (chronic obstructive pulmonary disease) 09/01/2018   Quit smoking 2010  - Spirometry 08/31/2018  FEV1 1.1 (51%)  Ratio 66 s prior rx with typical curvature  - 08/31/2018  After extensive coaching inhaler device,  effectiveness =    90% with elipta > try anoro sample > no better so d/c'  - PFT's  11/10/2018  FEV1 1.24 (59 % ) ratio 75 (ratio with fev1/VC =  63)  p no % improvement from saba p nothing prior to study with DLCO  105  % corrects to 132  %    DOE (dyspnea on exertion) 08/31/2018   Onset 05/2018 with background of unexplained leg swelling x 01/2018   -echo 04/13/18 just G1   diastolic dysfunction s PH   -  08/31/2018   Walked RA x one lap @ 185 stopped due to  Sob/ sats 89% at nl pace  - Spirometry 08/31/2018  FEV1 1.1 (51%)  Ratio 66 s prior rx  - 09/28/2018 could not do full pfts 09/28/2018  - Collagen vasc profile 09/28/18  For ESR =  54 (was 102 before prednisone)  Neg collag   Dyshidrotic eczema 12/13/2010   Essential  hypertension 01/26/2019   Generalized anxiety disorder with panic attacks 01/20/2018   GERD (gastroesophageal reflux disease) 11/03/2007   Headache 11/03/2007   Irritable bowel syndrome 07/13/2009   Major depressive disorder 07/17/2020   Mixed hyperlipidemia 07/20/2009   Obesity    OSA (obstructive sleep apnea) 07/26/2015   HST 08/2015 mild OSA, AHI 8/hour, lowest desaturation 81%, desaturations noted without respiratory events  Formatting of this note might be different from the original. PSG 05/20/19 AHI 25.1   Osteopenia of multiple sites 06/01/2019   DEXA 05/24/2019: Right femur neck T- 2.2, left femur neck T- 1.5, right total femur T -0.5, left total femur T -0.5, AP total spine T- 1.9. FRAX 10-year probability of major osteoporotic fracture is 8.8% and a hip fracture is 1.2%  DEXA 05/05/2019: Right femur neck T- 1.9, left femur neck T- 2.0, right total femur T -1.3, left total femur   Oxygen deficiency    Postinflammatory pulmonary fibrosis 09/15/2018   ESR    10/161/9  = 102   > rx short term prednisone ? slt improvement in symptoms HRCT 09/15/2018  1. Very mild basilar subpleural ground-glass, reticulation and traction bronchiolectasis, findings  which may be minimally progressive from 06/29/2017 and are indicative of interstitial lung disease such as nonspecific interstitial pneumonitis. Findings are indeterminate for UIP per consensus guidelin   Psychophysiological insomnia 03/29/2019   Type 2 diabetes mellitus 02/09/2017   UTI (urinary tract infection)    Vertigo 03/29/2014   Vitamin D deficiency 09/15/2020   Past Surgical History:  Procedure Laterality Date   ANKLE FRACTURE SURGERY Right    benign tumor      removed from groin   COLONOSCOPY WITH PROPOFOL N/A 05/15/2022   per Dr. Lavon Paganini, adenomatous polyps, repeat in 5 yrs   EYE SURGERY     HOT HEMOSTASIS N/A 05/15/2022   Procedure: HOT HEMOSTASIS (ARGON PLASMA COAGULATION/BICAP);  Surgeon: Napoleon Form, MD;  Location: [facility];  Service: Gastroenterology;  Laterality: N/A;   POLYPECTOMY  05/15/2022   Procedure: POLYPECTOMY;  Surgeon: Napoleon Form, MD;  Location: [facility];  Service: Gastroenterology;;   WRIST ARTHROCENTESIS N/A 2001   Patient Active Problem List   Diagnosis Date Noted   Morbid obesity (HCC) 06/16/2022   Physical deconditioning 06/16/2022   AVM (arteriovenous malformation) of colon, acquired with hemorrhage    Positive colorectal cancer screening using Cologuard test    Polyp of sigmoid colon    Polyp of transverse colon    Polyp of cecum    COVID-19 virus infection 11/21/2021   Vitamin D deficiency 09/15/2020   Major depressive disorder 07/17/2020   Chronic urticaria 12/21/2019   Osteopenia of multiple sites 06/01/2019   Psychophysiological insomnia 03/29/2019   Centrilobular emphysema 02/04/2019   Chronic respiratory failure with hypoxia 02/04/2019   Chronic diastolic CHF (congestive heart failure) 01/28/2019   Essential hypertension 01/26/2019   Postinflammatory pulmonary fibrosis 09/15/2018   COPD (chronic obstructive pulmonary disease) 09/01/2018   DOE (dyspnea on exertion) 08/31/2018   Bilateral leg edema 03/31/2018   Generalized anxiety disorder with panic attacks 01/20/2018   Type 2 diabetes mellitus 02/09/2017   Back pain 10/18/2015   OSA (obstructive sleep apnea) 07/26/2015   Vertigo 03/29/2014   Dyshidrotic eczema 12/13/2010   Acquired hypothyroidism 10/01/2010   Mixed hyperlipidemia 07/20/2009   Allergic rhinitis 07/20/2009   Irritable bowel syndrome 07/13/2009   GERD (gastroesophageal reflux disease) 11/03/2007   Headache 11/03/2007    PCP: Gershon Crane MD   REFERRING PROVIDER: Gershon Crane MD   REFERRING DIAG:  Diagnosis  J96.11 (ICD-10-CM) - Chronic respiratory failure with hypoxia (HCC)    THERAPY DIAG:  Physical deconditioning  Other abnormalities of gait and mobility  Rationale for Evaluation and Treatment: Rehabilitation  ONSET  DATE: has been on O2 for 3 years   SUBJECTIVE:   SUBJECTIVE STATEMENT: The patient reports her right knee has been hurting some this weekend. She has otherwise been doing well.  PERTINENT HISTORY: Knees, Loweback, and wrist arthritis. CHF taking lasix, post inflamitory pulmonary fibrosis; vertigo ( still has syncope at times)  PAIN:  Are you having pain? Yes: NPRS scale: 4/10 Pain location: Left knee Pain description: Aching Aggravating factors: Standing walking Relieving factors: Sitting  PRECAUTIONS: falls   WEIGHT BEARING RESTRICTIONS: No  FALLS:  Has patient fallen in last 6 months? No but has ad some near falls   LIVING ENVIRONMENT: Ramp into the house   OCCUPATION: retired  Recreation: would like to be able to get back to gardening  PLOF: Independent with household mobility with device  PATIENT GOALS: To increase endurance  NEXT MD VISIT: Nothing scheduled  OBJECTIVE:   DIAGNOSTIC FINDINGS: Nothing  of the knee   COGNITION: Overall cognitive status: Within functional limits for tasks assessed     SENSATION: Has had tinglining in her hands. Has been using wrist guards   MUSCLE LENGTH:  POSTURE: rounded shoulders, forward head, and flexed trunk    LOWER EXTREMITY ROM:  Passive ROM Right eval Left eval  Hip flexion    Hip extension    Hip abduction    Hip adduction    Hip internal rotation    Hip external rotation    Knee flexion  Painful at end range  Knee extension    Ankle dorsiflexion    Ankle plantarflexion    Ankle inversion    Ankle eversion     (Blank rows = not tested)  LOWER EXTREMITY MMT:  MMT Right eval Left eval Right 4/8 Left 4//8 right Left 5/20  Hip flexion 19.7 17.0 33.4 29 36. 35.9  Hip extension        Hip abduction 36.9 40.3 35 40.9 47.8 44.9  Hip adduction        Hip internal rotation        Hip external rotation        Knee flexion        Knee extension 20 18.7 45 35 44.7 56.5  Ankle dorsiflexion        Ankle  plantarflexion        Ankle inversion        Ankle eversion         (Blank rows = not tested)  LOWER EXTREMITY SPECIAL TESTS:   IE: 5x sit to stand: 24 sec   Hr 85  Sao2 96   01/26/23: walk test: 256ft total- seated rest break at 169ft and paused timer. 4/15: 5xSTS-21sec  5/20   GAIT:  TODAY'S TREATMENT:                                                                                                                               DATE:  5/28 Hamstring curl LF 20 lbs 2x15  Cybex leg press 3x10 55 lbs  Hip abduction machine 55 pounds 3 x 15  Bicep curls 3x10 3lbs Chest Press 3x10 3lbs Overhead press 3x10 3lbs   Ambulation 300' with 1 seated rest break. Sao2 down to 84%  Nu-step L4 6 min Sao2 remained> 93%     5/23 Cybex leg press 3x10 55 lbs  Cybex chest press 3x10 9lbs  Cybex elbow flexion 10 lbs 3x10  Cybex lat pull down 10 lbs 3x10    Ambulation 300' with 1 seated rest break.     5/20  NuStep 3 minutes x 2 SaO2 94 at baseline 95 after treatment. 3-minute walk test/5 times sit to stand test/review of goals. Progress note performed.  See above for testing results  Row machine 15 pounds 3 x 15 Tricep push down 40 pounds 3 x 15 Hip abduction machine 55 pounds 3 x 15  5/15 Nu-step 3 min x2 Sao2 95 at baselin 98 after exercises   Ambulation 300' with 1 seated rest break.   Last visit:  Nustep x4 mins L3 UE and LE 90 O2 at entry after first set at 95 moderate dyspnea notes  Shuttle press 56-68# x15, 68#x15 (O2 dropped to 85% afterwards) LF row machine 2x15 20# Hip abduction 3x10 55# LF Triceps press down 40# 2x15  Sao2 monitored throughout the session. Frequently fluctuated between 85% and 96%  Gait: Ambulation 2x150' with seated rest break of 5 min Sao2 dropped to 83%, rose back to 96%      PATIENT EDUCATION:  Education details: HEP, symptom management, activity progression Person educated: Patient Education method: Explanation,  Demonstration, Tactile cues, and Verbal cues Education comprehension: verbalized understanding, returned demonstration, verbal cues required, tactile cues required, and needs further education  HOME EXERCISE PROGRAM: Access Code: [email or web address] URL: [email or web address] Date: 01/19/2023 Prepared by: Riki Altes  Exercises - Heel Raises with Counter Support  - 1-2 x daily - 7 x weekly - 2 sets - 10 reps - Standing March with Counter Support  - 1-2 x daily - 7 x weekly - 2 sets - 10 reps - Standing Hip Abduction with Counter Support  - 1-2 x daily - 7 x weekly - 2 sets - 10 reps - Standing Hip Extension with Counter Support  - 1-2 x daily - 7 x weekly - 2 sets - 10 reps - Mini Squat with Counter Support  - 1-2 x daily - 7 x weekly - 2 sets - 10 reps - Standing Knee Flexion with Counter Support  - 1-2 x daily - 7 x weekly - 2 sets - 10 reps  ASSESSMENT:  CLINICAL IMPRESSION: The patient had some dips in her sao2 today. She was able to recover with a 1-2 min seated rest break. She still tolerated her exercises while. She has been doing well with her gym exercises. We will continue to advance as tolerated.    OBJECTIVE IMPAIRMENTS: Abnormal gait, cardiopulmonary status limiting activity, decreased activity tolerance, decreased balance, decreased endurance, decreased mobility, difficulty walking, decreased ROM, decreased strength, and pain.   ACTIVITY LIMITATIONS: carrying, lifting, bending, standing, squatting, stairs, transfers, bathing, dressing, and locomotion level  PARTICIPATION LIMITATIONS: meal prep, cleaning, laundry, driving, shopping, community activity, and yard work  PERSONAL FACTORS: 3+ comorbidities: Knees, Loweback, and wrist arthritis. CHF taking lasix, post inflamitory pulmonary fibrosis; vertigo ( still has syncope at times) are also affecting patient's functional outcome.   REHAB POTENTIAL: Good  CLINICAL DECISION MAKING: Evolving/moderate complexity  declining overall mobility   EVALUATION COMPLEXITY: Moderate   GOALS: Goals reviewed with patient? Yes  SHORT TERM GOALS: Target date: 02/13/2023   Therapy will perform 6-minute walk test with patient and come up with baseline goals Baseline: Goal status: MET 01/26/23  2.  Patient will decrease sit to stand time time less than 12 seconds  baseline:  Goal status: Goal achieved and revised 5/20  3.  Patient will perform 10 minutes straight exercise on NuStep maintaining a heart rate less than 110 Baseline:  Goal status: Met 4/8 revised on   LONG TERM GOALS: Target date: 03/13/2023    Patient will have a complete exercise program that she can take to [employer] sne[employer] and continue long-term Baseline:  Goal status: progressing towards program continue to work on machines 5/20  2.  Patient will ambulate 600 feet without self-report of dyspnea in order to attend appointments without being short of breath Baseline:  Goal status: 300 feet progressing 3.  Patient will stand for greater than 20 minutes without increased pain in left knee and increased dyspnea in order to perform ADLs Baseline:  Goal status: In progress 5/20    PLAN:  PT FREQUENCY: 2x/week  PT DURATION: 8 weeks  PLANNED INTERVENTIONS: Therapeutic exercises, Therapeutic activity, Neuromuscular re-education, Balance training, Gait training, Patient/Family education, Self Care, Joint mobilization, Stair training, DME instructions, Aquatic Therapy, Dry Needling, Electrical stimulation, Spinal mobilization, Cryotherapy, Moist heat, Ultrasound, and Manual therapy  PLAN FOR NEXT SESSION: Continue to progress patient HEP. Consider 2 laps of gait training. Consider standing exercise; mini squats and standing marches on foam pad to work on balance. Consider forward step ups with 2in.  Lorayne Bender PT DPT   04/23/2023, 2:55 PM

## 2023-04-29 ENCOUNTER — Ambulatory Visit (INDEPENDENT_AMBULATORY_CARE_PROVIDER_SITE_OTHER): Payer: PPO | Admitting: Adult Health

## 2023-04-29 ENCOUNTER — Encounter: Payer: Self-pay | Admitting: Adult Health

## 2023-04-29 VITALS — BP 150/76 | HR 75 | Temp 97.7°F | Ht 60.0 in

## 2023-04-29 DIAGNOSIS — R350 Frequency of micturition: Secondary | ICD-10-CM

## 2023-04-29 DIAGNOSIS — Z87891 Personal history of nicotine dependence: Secondary | ICD-10-CM | POA: Diagnosis not present

## 2023-04-29 DIAGNOSIS — R1030 Lower abdominal pain, unspecified: Secondary | ICD-10-CM | POA: Diagnosis not present

## 2023-04-29 DIAGNOSIS — K5900 Constipation, unspecified: Secondary | ICD-10-CM | POA: Diagnosis not present

## 2023-04-29 DIAGNOSIS — J984 Other disorders of lung: Secondary | ICD-10-CM | POA: Diagnosis not present

## 2023-04-29 LAB — POC URINALSYSI DIPSTICK (AUTOMATED)
Bilirubin, UA: NEGATIVE
Blood, UA: NEGATIVE
Glucose, UA: NEGATIVE
Ketones, UA: NEGATIVE
Leukocytes, UA: NEGATIVE
Nitrite, UA: NEGATIVE
Protein, UA: NEGATIVE
Spec Grav, UA: 1.02 (ref 1.010–1.025)
Urobilinogen, UA: 0.2 E.U./dL
pH, UA: 5 (ref 5.0–8.0)

## 2023-04-29 NOTE — Telephone Encounter (Signed)
Spoke with pt this morning, scheduled with Kandee Keen this afternoon for UTI

## 2023-04-29 NOTE — Progress Notes (Signed)
Subjective:    Patient ID: Bonnie Gonzalez, female    DOB: January 11, 1956, 67 y.o.   MRN: [id number]  HPI  67 year old female who  has a past medical history of Acquired hypothyroidism (10/01/2010), Allergic rhinitis (07/20/2009), Arthritis, Back pain (10/18/2015), Bilateral leg edema (03/31/2018), Centrilobular emphysema (02/04/2019), Chronic diastolic CHF (congestive heart failure) (01/28/2019), Chronic respiratory failure with hypoxia (02/04/2019), Chronic urticaria (12/21/2019), COPD (chronic obstructive pulmonary disease) (09/01/2018), DOE (dyspnea on exertion) (08/31/2018), Dyshidrotic eczema (12/13/2010), Essential hypertension (01/26/2019), Generalized anxiety disorder with panic attacks (01/20/2018), GERD (gastroesophageal reflux disease) (11/03/2007), Headache (11/03/2007), Irritable bowel syndrome (07/13/2009), Major depressive disorder (07/17/2020), Mixed hyperlipidemia (07/20/2009), Obesity, OSA (obstructive sleep apnea) (07/26/2015), Osteopenia of multiple sites (06/01/2019), Oxygen deficiency, Postinflammatory pulmonary fibrosis (09/15/2018), Psychophysiological insomnia (03/29/2019), Type 2 diabetes mellitus (02/09/2017), UTI (urinary tract infection), Vertigo (03/29/2014), and Vitamin D deficiency (09/15/2020).  She is a patient of Dr. Clent Ridges who I am seeing today for an acute issue. She reports that for the last few few days she has been experiencing a urinary frequency with decreased urination,  abdominal pressure and sharp pain in her lower abdomen. Pain is not present all the time.  She does not drink more than 2 cups of coffee a day, Does not eat chocolate or spicy food on occasion.   She denies hematuria or dysuria, fevers, or chills.   She does report that she has been very constipated, Linzess has been increased recently to  290 mg daily.    Review of Systems See HPI   Past Medical History:  Diagnosis Date   Acquired hypothyroidism 10/01/2010   Allergic rhinitis 07/20/2009    Arthritis    Back pain 10/18/2015   Bilateral leg edema 03/31/2018   Centrilobular emphysema 02/04/2019   HRCT 09/15/18   Chronic diastolic CHF (congestive heart failure) 01/28/2019   Chronic respiratory failure with hypoxia 02/04/2019   6 Min Walk 08/31/18 at [facility]   Chronic urticaria 12/21/2019   COPD (chronic obstructive pulmonary disease) 09/01/2018   Quit smoking 2010  - Spirometry 08/31/2018  FEV1 1.1 (51%)  Ratio 66 s prior rx with typical curvature  - 08/31/2018  After extensive coaching inhaler device,  effectiveness =    90% with elipta > try anoro sample > no better so d/c'  - PFT's  11/10/2018  FEV1 1.24 (59 % ) ratio 75 (ratio with fev1/VC =  63)  p no % improvement from saba p nothing prior to study with DLCO  105  % corrects to 132  %    DOE (dyspnea on exertion) 08/31/2018   Onset 05/2018 with background of unexplained leg swelling x 01/2018   -echo 04/13/18 just G1   diastolic dysfunction s PH   -  08/31/2018   Walked RA x one lap @ 185 stopped due to  Sob/ sats 89% at nl pace  - Spirometry 08/31/2018  FEV1 1.1 (51%)  Ratio 66 s prior rx  - 09/28/2018 could not do full pfts 09/28/2018  - Collagen vasc profile 09/28/18  For ESR =  54 (was 102 before prednisone)  Neg collag   Dyshidrotic eczema 12/13/2010   Essential hypertension 01/26/2019   Generalized anxiety disorder with panic attacks 01/20/2018   GERD (gastroesophageal reflux disease) 11/03/2007   Headache 11/03/2007   Irritable bowel syndrome 07/13/2009   Major depressive disorder 07/17/2020   Mixed hyperlipidemia 07/20/2009   Obesity    OSA (obstructive sleep apnea) 07/26/2015   HST 08/2015 mild OSA, AHI 8/hour, lowest  desaturation 81%, desaturations noted without respiratory events  Formatting of this note might be different from the original. PSG 05/20/19 AHI 25.1   Osteopenia of multiple sites 06/01/2019   DEXA 05/24/2019: Right femur neck T- 2.2, left femur neck T- 1.5, right total femur T -0.5, left total  femur T -0.5, AP total spine T- 1.9. FRAX 10-year probability of major osteoporotic fracture is 8.8% and a hip fracture is 1.2%  DEXA 05/05/2019: Right femur neck T- 1.9, left femur neck T- 2.0, right total femur T -1.3, left total femur   Oxygen deficiency    Postinflammatory pulmonary fibrosis 09/15/2018   ESR    10/161/9  = 102   > rx short term prednisone ? slt improvement in symptoms HRCT 09/15/2018  1. Very mild basilar subpleural ground-glass, reticulation and traction bronchiolectasis, findings which may be minimally progressive from 06/29/2017 and are indicative of interstitial lung disease such as nonspecific interstitial pneumonitis. Findings are indeterminate for UIP per consensus guidelin   Psychophysiological insomnia 03/29/2019   Type 2 diabetes mellitus 02/09/2017   UTI (urinary tract infection)    Vertigo 03/29/2014   Vitamin D deficiency 09/15/2020    Social History   Socioeconomic History   Marital status: Divorced    Spouse name: Not on file   Number of children: 4   Years of education: 13   Highest education level: Some college, no degree  Occupational History    Comment: retired  Tobacco Use   Smoking status: Former    Packs/day: 1.00    Years: 35.00    Additional pack years: 0.00    Total pack years: 35.00    Types: Cigarettes    Quit date: 12/01/2008    Years since quitting: 14.4   Smokeless tobacco: Never  Vaping Use   Vaping Use: Never used  Substance and Sexual Activity   Alcohol use: Not Currently   Drug use: No   Sexual activity: Not Currently  Other Topics Concern   Not on file  Social History Narrative   Lives alone.   She has four grown children.   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   Highest level of education:  1.5 years of college      Right Handed   Is on 3 L of oxygen    Social Determinants of Health   Financial Resource Strain: Low Risk  (04/15/2022)   Overall Financial Resource Strain (CARDIA)     Difficulty of Paying Living Expenses: Not hard at all  Food Insecurity: No Food Insecurity (04/15/2022)   Hunger Vital Sign    Worried About Running Out of Food in the Last Year: Never true    Ran Out of Food in the Last Year: Never true  Transportation Needs: No Transportation Needs (04/15/2022)   PRAPARE - Administrator, Civil Service (Medical): No    Lack of Transportation (Non-Medical): No  Physical Activity: Inactive (04/16/2023)   Exercise Vital Sign    Days of Exercise per Week: 0 days    Minutes of Exercise per Session: 0 min  Stress: No Stress Concern Present (04/15/2022)   [employer] of Occupational Health - Occupational Stress Questionnaire    Feeling of Stress : Only a little  Social Connections: Socially Isolated (04/15/2022)   Social Connection and Isolation Panel [NHANES]    Frequency of Communication with Friends and Family: More than three times a week    Frequency of Social Gatherings with Friends and Family:  More than three times a week    Attends Religious Services: Never    Active Member of Clubs or Organizations: No    Attends Banker Meetings: Never    Marital Status: Divorced  Catering manager Violence: Not At Risk (04/15/2022)   Humiliation, Afraid, Rape, and Kick questionnaire    Fear of Current or Ex-Partner: No    Emotionally Abused: No    Physically Abused: No    Sexually Abused: No    Past Surgical History:  Procedure Laterality Date   ANKLE FRACTURE SURGERY Right    benign tumor      removed from groin   COLONOSCOPY WITH PROPOFOL N/A 05/15/2022   per Dr. Lavon Paganini, adenomatous polyps, repeat in 5 yrs   EYE SURGERY     HOT HEMOSTASIS N/A 05/15/2022   Procedure: HOT HEMOSTASIS (ARGON PLASMA COAGULATION/BICAP);  Surgeon: Napoleon Form, MD;  Location: [facility];  Service: Gastroenterology;  Laterality: N/A;   POLYPECTOMY  05/15/2022   Procedure: POLYPECTOMY;  Surgeon: Napoleon Form, MD;  Location: [facility];  Service: Gastroenterology;;   WRIST ARTHROCENTESIS N/A 2001    Family History  Problem Relation Age of Onset   Arthritis/Rheumatoid Mother    Alzheimer's disease Father    Diabetes Mellitus II Father    Dementia Father    Diabetes Mellitus II Sister    Dementia Brother    Diabetes Mellitus II Brother    Asthma Other        fhx   Depression Other        fhx   Diabetes Other        fhx   Parkinsonism Other        fhx   Lupus Other        fhx    Allergies  Allergen Reactions   Aspirin     Mother had to be resuscitated after ingesting this   Hydrocodone-Acetaminophen Hives   Oxycodone-Acetaminophen Hives   Penicillins Hives    Did it involve swelling of the face/tongue/throat, SOB, or low BP? Yes-hives Did it involve sudden or severe rash/hives, skin peeling, or any reaction on the inside of your mouth or nose? Unk Did you need to seek medical attention at a hospital or doctor's office? Yes When did it last happen? Was in her 30's If all above answers are "NO", may proceed with cephalosporin use.  Did it involve swelling of the face/tongue/throat, SOB, or low BP? Yes-hives Did it involve sudden or severe rash/hives, skin peeling, or any reaction on the inside of your mouth or nose? Unk Did you need to seek medical attention at a hospital or doctor's office? Yes When did it last happen? Was in her 30's If all above answers are "NO", may proceed with cephalosporin use.   Codeine Hives   Fluoxetine Hcl Itching and Other (See Comments)    "Hair itching"   Trazodone Other (See Comments)    Couldn't walk    Current Outpatient Medications on File Prior to Visit  Medication Sig Dispense Refill   albuterol (PROVENTIL HFA;VENTOLIN HFA) 108 (90 Base) MCG/ACT inhaler Inhale 2 puffs into the lungs every 4 (four) hours as needed for wheezing or shortness of breath. 1 Inhaler 11   ALPRAZolam (XANAX) 0.5 MG tablet TAKE 1 TABLET (0.5 MG TOTAL) BY MOUTH 2 (TWO) TIMES DAILY  AS NEEDED. (Patient taking differently: Take 0.25-0.5 mg by mouth 2 (two) times daily as needed for anxiety or sleep.) 60 tablet 5  atorvastatin (LIPITOR) 80 MG tablet Take 1 tablet (80 mg total) by mouth daily. 90 tablet 3   azelastine (ASTELIN) 0.1 % nasal spray Place 1 spray into both nostrils 2 (two) times daily as needed for allergies. 30 mL 1   benzonatate (TESSALON) 200 MG capsule TAKE 1 CAPSULE BY MOUTH EVERY 8 HOURS AS NEEDED FOR COUGH 60 capsule 1   buPROPion ER (WELLBUTRIN SR) 100 MG 12 hr tablet Take 100 mg by mouth daily.     cetirizine (ZYRTEC) 10 MG tablet Take 10 mg by mouth daily.     Cholecalciferol 125 MCG (5000 UT) TABS Take 5,000 Units by mouth in the morning.     Continuous Glucose Sensor (FREESTYLE LIBRE 2 SENSOR) MISC Inject 1 kit into the skin every 14 (fourteen) days.     Dextromethorphan-guaiFENesin (MUCINEX COUGH & CHEST CONGEST PO) Take 1 capsule by mouth as needed (Patient taking as needed).     DULoxetine (CYMBALTA) 60 MG capsule Take 1 capsule (60 mg total) by mouth daily. (Patient taking differently: Take 120 mg by mouth at bedtime. Take two capsules daily) 90 capsule 3   esomeprazole (NEXIUM) 20 MG capsule TAKE 1 CAPSULE BY MOUTH ONCE DAILY BEFORE BREAKFAST 90 capsule 3   Fluticasone-Umeclidin-Vilant (TRELEGY ELLIPTA) 100-62.5-25 MCG/INH AEPB Inhale 1 puff into the lungs daily.     furosemide (LASIX) 20 MG tablet Take 1 tablet (20 mg total) by mouth daily. 90 tablet 3   glucose 4 GM chewable tablet Chew 1 tablet (4 g total) by mouth as needed for low blood sugar (< 70). 50 tablet 0   hydrOXYzine (VISTARIL) 25 MG capsule Take 1 capsule by mouth three times daily as needed 90 capsule 1   Hyprom-Naphaz-Polysorb-Zn Sulf (CLEAR EYES COMPLETE) SOLN Place 1-2 drops into both eyes as needed (for itching).     insulin aspart (NOVOLOG) 100 UNIT/ML injection Use sliding scale provided at time of discharge, administer 3 times daily with meals (Patient taking differently: Inject  15-48 Units into the skin See admin instructions. Inject 20-40 units subcutaneously 3 times daily with meals and inject 15-35 units subcutaneously at night if needed with nighttime snacking) 10 mL 0   insulin glargine, 2 Unit Dial, (TOUJEO MAX SOLOSTAR) 300 UNIT/ML Solostar Pen Inject 80 Units into the skin 2 (two) times daily.     Insulin Pen Needle (B-D UF III MINI PEN NEEDLES) 31G X 5 MM MISC Use to administer insulin five times a day     levothyroxine (SYNTHROID) 175 MCG tablet TAKE 1 TABLET BY MOUTH ONCE DAILY BEFORE BREAKFAST. *Labs required for future refills* 90 tablet 3   linaclotide (LINZESS) 290 MCG CAPS capsule Take 1 capsule (290 mcg total) by mouth daily before breakfast. 30 capsule 11   methocarbamol (ROBAXIN) 500 MG tablet Take 1 tablet (500 mg total) by mouth every 6 (six) hours as needed for muscle spasms. 120 tablet 5   Misc Natural Products (COMPLETE MENOPAUSE HEALTH PO) Take by mouth. [place] complete menopause gummies     MOUNJARO 12.5 MG/0.5ML Pen Inject 12.5 mg into the skin once a week.     naproxen (NAPROSYN) 500 MG tablet Take 1 tablet by mouth twice daily 60 tablet 5   Nerve Stimulator (CLEVER CHOICE TENS UNIT) DEVI Place 1 Dose onto the skin as needed (pain.). Tens 7000     nystatin (MYCOSTATIN/NYSTOP) powder Apply 1 Application topically 2 (two) times daily as needed (rash). 15 g 1   OXYGEN Inhale 3 L into the  lungs continuous.     primidone (MYSOLINE) 50 MG tablet Take 1 tablet (50 mg total) by mouth at bedtime. 90 tablet 3   Probiotic Product (MISC INTESTINAL FLORA REGULAT) CAPS Take 1 capsule by mouth daily.     Rhubarb (ESTROVEN COMPLETE PO) Take by mouth.     temazepam (RESTORIL) 15 MG capsule Take 15 mg by mouth at bedtime.     terbinafine (LAMISIL) 250 MG tablet Take 1 tablet (250 mg total) by mouth daily. 90 tablet 1   No current facility-administered medications on file prior to visit.    BP (!) 150/76 (BP Location: Left Arm, Patient Position:  Sitting, Cuff Size: Large)   Pulse 75   Temp 97.7 F (36.5 C) (Oral)   Ht 5' (1.524 m)   SpO2 98%   BMI 52.54 kg/m       Objective:   Physical Exam Vitals and nursing note reviewed.  Constitutional:      Appearance: Normal appearance.  Cardiovascular:     Rate and Rhythm: Normal rate and regular rhythm.     Pulses: Normal pulses.     Heart sounds: Normal heart sounds.  Pulmonary:     Effort: Pulmonary effort is normal.     Breath sounds: Normal breath sounds.  Abdominal:     General: Abdomen is flat. Bowel sounds are normal.     Palpations: Abdomen is soft. There is no mass.     Tenderness: There is no abdominal tenderness. There is no guarding or rebound.  Skin:    General: Skin is warm and dry.  Neurological:     General: No focal deficit present.     Mental Status: She is alert and oriented to person, place, and time.  Psychiatric:        Mood and Affect: Mood normal.        Behavior: Behavior normal.        Thought Content: Thought content normal.        Judgment: Judgment normal.       Assessment & Plan:   1. Urinary frequency - UA negative. OAB vs Constipation - POCT Urinalysis Dipstick (Automated)  2. Lower abdominal pain - likely due to constipation. Does not appear as kidney stone   3. Constipation, unspecified constipation type - Encouraged one time use of Magnesium citrate - Increase water and fiber  - if symptoms sill present then follow up with PCP next week   Shirline Frees, NP

## 2023-05-05 ENCOUNTER — Ambulatory Visit (HOSPITAL_BASED_OUTPATIENT_CLINIC_OR_DEPARTMENT_OTHER): Payer: PPO | Attending: Family Medicine | Admitting: Physical Therapy

## 2023-05-05 DIAGNOSIS — R2689 Other abnormalities of gait and mobility: Secondary | ICD-10-CM | POA: Diagnosis not present

## 2023-05-05 DIAGNOSIS — R5381 Other malaise: Secondary | ICD-10-CM | POA: Insufficient documentation

## 2023-05-06 ENCOUNTER — Other Ambulatory Visit: Payer: Self-pay | Admitting: Family Medicine

## 2023-05-06 DIAGNOSIS — J309 Allergic rhinitis, unspecified: Secondary | ICD-10-CM

## 2023-05-06 NOTE — Therapy (Signed)
OUTPATIENT PHYSICAL THERAPY LOWER EXTREMITY treatment    Patient Name: Bonnie Gonzalez MRN: [id number] DOB:[birth year], 67 y.o., female Today's Date: 04/23/2023  END OF SESSION:  PT End of Session - 04/23/23 1306     Visit Number 21    Number of Visits 32    Date for PT Re-Evaluation 06/02/23    Authorization Type done at 20    PT Start Time 1303    PT Stop Time 1345    PT Time Calculation (min) 42 min    Activity Tolerance Patient tolerated treatment well    Behavior During Therapy [facility] for tasks assessed/performed                 Past Medical History:  Diagnosis Date   Acquired hypothyroidism 10/01/2010   Allergic rhinitis 07/20/2009   Arthritis    Back pain 10/18/2015   Bilateral leg edema 03/31/2018   Centrilobular emphysema 02/04/2019   HRCT 09/15/18   Chronic diastolic CHF (congestive heart failure) 01/28/2019   Chronic respiratory failure with hypoxia 02/04/2019   6 Min Walk 08/31/18 at [facility]   Chronic urticaria 12/21/2019   COPD (chronic obstructive pulmonary disease) 09/01/2018   Quit smoking 2010  - Spirometry 08/31/2018  FEV1 1.1 (51%)  Ratio 66 s prior rx with typical curvature  - 08/31/2018  After extensive coaching inhaler device,  effectiveness =    90% with elipta > try anoro sample > no better so d/c'  - PFT's  11/10/2018  FEV1 1.24 (59 % ) ratio 75 (ratio with fev1/VC =  63)  p no % improvement from saba p nothing prior to study with DLCO  105  % corrects to 132  %    DOE (dyspnea on exertion) 08/31/2018   Onset 05/2018 with background of unexplained leg swelling x 01/2018   -echo 04/13/18 just G1   diastolic dysfunction s PH   -  08/31/2018   Walked RA x one lap @ 185 stopped due to  Sob/ sats 89% at nl pace  - Spirometry 08/31/2018  FEV1 1.1 (51%)  Ratio 66 s prior rx  - 09/28/2018 could not do full pfts 09/28/2018  - Collagen vasc profile 09/28/18  For ESR =  54 (was 102 before prednisone)  Neg collag   Dyshidrotic eczema 12/13/2010   Essential  hypertension 01/26/2019   Generalized anxiety disorder with panic attacks 01/20/2018   GERD (gastroesophageal reflux disease) 11/03/2007   Headache 11/03/2007   Irritable bowel syndrome 07/13/2009   Major depressive disorder 07/17/2020   Mixed hyperlipidemia 07/20/2009   Obesity    OSA (obstructive sleep apnea) 07/26/2015   HST 08/2015 mild OSA, AHI 8/hour, lowest desaturation 81%, desaturations noted without respiratory events  Formatting of this note might be different from the original. PSG 05/20/19 AHI 25.1   Osteopenia of multiple sites 06/01/2019   DEXA 05/24/2019: Right femur neck T- 2.2, left femur neck T- 1.5, right total femur T -0.5, left total femur T -0.5, AP total spine T- 1.9. FRAX 10-year probability of major osteoporotic fracture is 8.8% and a hip fracture is 1.2%  DEXA 05/05/2019: Right femur neck T- 1.9, left femur neck T- 2.0, right total femur T -1.3, left total femur   Oxygen deficiency    Postinflammatory pulmonary fibrosis 09/15/2018   ESR    10/161/9  = 102   > rx short term prednisone ? slt improvement in symptoms HRCT 09/15/2018  1. Very mild basilar subpleural ground-glass, reticulation and traction bronchiolectasis, findings  which may be minimally progressive from 06/29/2017 and are indicative of interstitial lung disease such as nonspecific interstitial pneumonitis. Findings are indeterminate for UIP per consensus guidelin   Psychophysiological insomnia 03/29/2019   Type 2 diabetes mellitus 02/09/2017   UTI (urinary tract infection)    Vertigo 03/29/2014   Vitamin D deficiency 09/15/2020   Past Surgical History:  Procedure Laterality Date   ANKLE FRACTURE SURGERY Right    benign tumor      removed from groin   COLONOSCOPY WITH PROPOFOL N/A 05/15/2022   per Dr. Lavon Paganini, adenomatous polyps, repeat in 5 yrs   EYE SURGERY     HOT HEMOSTASIS N/A 05/15/2022   Procedure: HOT HEMOSTASIS (ARGON PLASMA COAGULATION/BICAP);  Surgeon: Napoleon Form, MD;  Location: [facility];  Service: Gastroenterology;  Laterality: N/A;   POLYPECTOMY  05/15/2022   Procedure: POLYPECTOMY;  Surgeon: Napoleon Form, MD;  Location: [facility];  Service: Gastroenterology;;   WRIST ARTHROCENTESIS N/A 2001   Patient Active Problem List   Diagnosis Date Noted   Morbid obesity (HCC) 06/16/2022   Physical deconditioning 06/16/2022   AVM (arteriovenous malformation) of colon, acquired with hemorrhage    Positive colorectal cancer screening using Cologuard test    Polyp of sigmoid colon    Polyp of transverse colon    Polyp of cecum    COVID-19 virus infection 11/21/2021   Vitamin D deficiency 09/15/2020   Major depressive disorder 07/17/2020   Chronic urticaria 12/21/2019   Osteopenia of multiple sites 06/01/2019   Psychophysiological insomnia 03/29/2019   Centrilobular emphysema 02/04/2019   Chronic respiratory failure with hypoxia 02/04/2019   Chronic diastolic CHF (congestive heart failure) 01/28/2019   Essential hypertension 01/26/2019   Postinflammatory pulmonary fibrosis 09/15/2018   COPD (chronic obstructive pulmonary disease) 09/01/2018   DOE (dyspnea on exertion) 08/31/2018   Bilateral leg edema 03/31/2018   Generalized anxiety disorder with panic attacks 01/20/2018   Type 2 diabetes mellitus 02/09/2017   Back pain 10/18/2015   OSA (obstructive sleep apnea) 07/26/2015   Vertigo 03/29/2014   Dyshidrotic eczema 12/13/2010   Acquired hypothyroidism 10/01/2010   Mixed hyperlipidemia 07/20/2009   Allergic rhinitis 07/20/2009   Irritable bowel syndrome 07/13/2009   GERD (gastroesophageal reflux disease) 11/03/2007   Headache 11/03/2007    PCP: Gershon Crane MD   REFERRING PROVIDER: Gershon Crane MD   REFERRING DIAG:  Diagnosis  J96.11 (ICD-10-CM) - Chronic respiratory failure with hypoxia (HCC)    THERAPY DIAG:  Physical deconditioning  Other abnormalities of gait and mobility  Rationale for Evaluation and Treatment: Rehabilitation  ONSET  DATE: has been on O2 for 3 years   SUBJECTIVE:   SUBJECTIVE STATEMENT: The patient has joined the [facility] and has gone 2x. She feels like she is doing well.    PERTINENT HISTORY: Knees, Loweback, and wrist arthritis. CHF taking lasix, post inflamitory pulmonary fibrosis; vertigo ( still has syncope at times)  PAIN:  Are you having pain? Yes: NPRS scale: 4/10 Pain location: Left knee Pain description: Aching Aggravating factors: Standing walking Relieving factors: Sitting  PRECAUTIONS: falls   WEIGHT BEARING RESTRICTIONS: No  FALLS:  Has patient fallen in last 6 months? No but has ad some near falls   LIVING ENVIRONMENT: Ramp into the house   OCCUPATION: retired  Recreation: would like to be able to get back to gardening  PLOF: Independent with household mobility with device  PATIENT GOALS: To increase endurance  NEXT MD VISIT: Nothing scheduled  OBJECTIVE:   DIAGNOSTIC FINDINGS:  Nothing of the knee   COGNITION: Overall cognitive status: Within functional limits for tasks assessed     SENSATION: Has had tinglining in her hands. Has been using wrist guards   MUSCLE LENGTH:  POSTURE: rounded shoulders, forward head, and flexed trunk    LOWER EXTREMITY ROM:  Passive ROM Right eval Left eval  Hip flexion    Hip extension    Hip abduction    Hip adduction    Hip internal rotation    Hip external rotation    Knee flexion  Painful at end range  Knee extension    Ankle dorsiflexion    Ankle plantarflexion    Ankle inversion    Ankle eversion     (Blank rows = not tested)  LOWER EXTREMITY MMT:  MMT Right eval Left eval Right 4/8 Left 4//8 right Left 5/20  Hip flexion 19.7 17.0 33.4 29 36. 35.9  Hip extension        Hip abduction 36.9 40.3 35 40.9 47.8 44.9  Hip adduction        Hip internal rotation        Hip external rotation        Knee flexion        Knee extension 20 18.7 45 35 44.7 56.5  Ankle dorsiflexion        Ankle plantarflexion         Ankle inversion        Ankle eversion         (Blank rows = not tested)  LOWER EXTREMITY SPECIAL TESTS:   IE: 5x sit to stand: 24 sec   Hr 85  Sao2 96   01/26/23: walk test: 255ft total- seated rest break at 133ft and paused timer. 4/15: 5xSTS-21sec  5/20   GAIT:  TODAY'S TREATMENT:                                                                                                                               DATE:  6/5 Hip abduction machine 55 pounds 3 x 15 Hip Adduction machine 40 lbs 3x15  Row machine 3x15   Ambulation 300' with no seated rest break. Sao2 down to 88    Nu-step L4 7  min Sao2 remained> 93%     5/28 Hamstring curl LF 20 lbs 2x15  Cybex leg press 3x10 55 lbs  Hip abduction machine 55 pounds 3 x 15  Bicep curls 3x10 3lbs Chest Press 3x10 3lbs Overhead press 3x10 3lbs   Ambulation 300' with 1 seated rest break. Sao2 down to 84%  Nu-step L4 6 min Sao2 remained> 93%     5/23 Cybex leg press 3x10 55 lbs  Cybex chest press 3x10 9lbs  Cybex elbow flexion 10 lbs 3x10  Cybex lat pull down 10 lbs 3x10    Ambulation 300' with 1 seated rest break.     5/20  NuStep 3 minutes x 2 SaO2 94 at  baseline 95 after treatment. 3-minute walk test/5 times sit to stand test/review of goals. Progress note performed.  See above for testing results  Row machine 15 pounds 3 x 15 Tricep push down 40 pounds 3 x 15 Hip abduction machine 55 pounds 3 x 15   5/15 Nu-step 3 min x2 Sao2 95 at baselin 98 after exercises   Ambulation 300' with 1 seated rest break.   Last visit:  Nustep x4 mins L3 UE and LE 90 O2 at entry after first set at 95 moderate dyspnea notes  Shuttle press 56-68# x15, 68#x15 (O2 dropped to 85% afterwards) LF row machine 2x15 20# Hip abduction 3x10 55# LF Triceps press down 40# 2x15  Sao2 monitored throughout the session. Frequently fluctuated between 85% and 96%  Gait: Ambulation 2x150' with seated rest break of 5 min Sao2  dropped to 83%, rose back to 96%      PATIENT EDUCATION:  Education details: HEP, symptom management, activity progression Person educated: Patient Education method: Explanation, Demonstration, Tactile cues, and Verbal cues Education comprehension: verbalized understanding, returned demonstration, verbal cues required, tactile cues required, and needs further education  HOME EXERCISE PROGRAM: Access Code: [email or web address] URL: [email or web address] Date: 01/19/2023 Prepared by: Riki Altes  Exercises - Heel Raises with Counter Support  - 1-2 x daily - 7 x weekly - 2 sets - 10 reps - Standing March with Counter Support  - 1-2 x daily - 7 x weekly - 2 sets - 10 reps - Standing Hip Abduction with Counter Support  - 1-2 x daily - 7 x weekly - 2 sets - 10 reps - Standing Hip Extension with Counter Support  - 1-2 x daily - 7 x weekly - 2 sets - 10 reps - Mini Squat with Counter Support  - 1-2 x daily - 7 x weekly - 2 sets - 10 reps - Standing Knee Flexion with Counter Support  - 1-2 x daily - 7 x weekly - 2 sets - 10 reps  ASSESSMENT:  CLINICAL IMPRESSION: The patient continues to make great progress. She has been able to do a whole lap without a seated rest break. She had less dyspnea today with ambulation. Therapy continues to review gym exercises. She did back extensions at the gym and reports they make her back feels great. Therapy advised her that sometimes they can be irritating to the back.   OBJECTIVE IMPAIRMENTS: Abnormal gait, cardiopulmonary status limiting activity, decreased activity tolerance, decreased balance, decreased endurance, decreased mobility, difficulty walking, decreased ROM, decreased strength, and pain.   ACTIVITY LIMITATIONS: carrying, lifting, bending, standing, squatting, stairs, transfers, bathing, dressing, and locomotion level  PARTICIPATION LIMITATIONS: meal prep, cleaning, laundry, driving, shopping, community activity, and yard work  PERSONAL  FACTORS: 3+ comorbidities: Knees, Loweback, and wrist arthritis. CHF taking lasix, post inflamitory pulmonary fibrosis; vertigo ( still has syncope at times) are also affecting patient's functional outcome.   REHAB POTENTIAL: Good  CLINICAL DECISION MAKING: Evolving/moderate complexity declining overall mobility   EVALUATION COMPLEXITY: Moderate   GOALS: Goals reviewed with patient? Yes  SHORT TERM GOALS: Target date: 02/13/2023   Therapy will perform 6-minute walk test with patient and come up with baseline goals Baseline: Goal status: MET 01/26/23  2.  Patient will decrease sit to stand time time less than 12 seconds  baseline:  Goal status: Goal achieved and revised 5/20  3.  Patient will perform 10 minutes straight exercise on NuStep maintaining a heart rate less than 110 Baseline:  Goal status: Met 4/8  revised on   LONG TERM GOALS: Target date: 03/13/2023    Patient will have a complete exercise program that she can take to [employer] and continue long-term Baseline:  Goal status: progressing towards program continue to work on machines 5/20  2.  Patient will ambulate 600 feet without self-report of dyspnea in order to attend appointments without being short of breath Baseline:  Goal status: 300 feet progressing 3.  Patient will stand for greater than 20 minutes without increased pain in left knee and increased dyspnea in order to perform ADLs Baseline:  Goal status: In progress 5/20    PLAN:  PT FREQUENCY: 2x/week  PT DURATION: 8 weeks  PLANNED INTERVENTIONS: Therapeutic exercises, Therapeutic activity, Neuromuscular re-education, Balance training, Gait training, Patient/Family education, Self Care, Joint mobilization, Stair training, DME instructions, Aquatic Therapy, Dry Needling, Electrical stimulation, Spinal mobilization, Cryotherapy, Moist heat, Ultrasound, and Manual therapy  PLAN FOR NEXT SESSION: Continue to progress patient HEP. Consider 2 laps of  gait training. Consider standing exercise; mini squats and standing marches on foam pad to work on balance. Consider forward step ups with 2in.  Lorayne Bender PT DPT   04/23/2023, 2:55 PM

## 2023-05-07 DIAGNOSIS — J449 Chronic obstructive pulmonary disease, unspecified: Secondary | ICD-10-CM | POA: Diagnosis not present

## 2023-05-07 DIAGNOSIS — J432 Centrilobular emphysema: Secondary | ICD-10-CM | POA: Diagnosis not present

## 2023-05-07 DIAGNOSIS — Z87891 Personal history of nicotine dependence: Secondary | ICD-10-CM | POA: Diagnosis not present

## 2023-05-07 DIAGNOSIS — J9611 Chronic respiratory failure with hypoxia: Secondary | ICD-10-CM | POA: Diagnosis not present

## 2023-05-08 ENCOUNTER — Encounter (HOSPITAL_BASED_OUTPATIENT_CLINIC_OR_DEPARTMENT_OTHER): Payer: Self-pay

## 2023-05-08 ENCOUNTER — Ambulatory Visit (HOSPITAL_BASED_OUTPATIENT_CLINIC_OR_DEPARTMENT_OTHER): Payer: PPO

## 2023-05-08 DIAGNOSIS — Z7951 Long term (current) use of inhaled steroids: Secondary | ICD-10-CM | POA: Diagnosis not present

## 2023-05-08 DIAGNOSIS — R2689 Other abnormalities of gait and mobility: Secondary | ICD-10-CM

## 2023-05-08 DIAGNOSIS — R5381 Other malaise: Secondary | ICD-10-CM | POA: Diagnosis not present

## 2023-05-08 DIAGNOSIS — K219 Gastro-esophageal reflux disease without esophagitis: Secondary | ICD-10-CM | POA: Diagnosis not present

## 2023-05-08 DIAGNOSIS — E119 Type 2 diabetes mellitus without complications: Secondary | ICD-10-CM | POA: Diagnosis not present

## 2023-05-08 DIAGNOSIS — G4733 Obstructive sleep apnea (adult) (pediatric): Secondary | ICD-10-CM | POA: Diagnosis not present

## 2023-05-08 DIAGNOSIS — Z794 Long term (current) use of insulin: Secondary | ICD-10-CM | POA: Diagnosis not present

## 2023-05-08 DIAGNOSIS — I509 Heart failure, unspecified: Secondary | ICD-10-CM | POA: Diagnosis not present

## 2023-05-08 DIAGNOSIS — J449 Chronic obstructive pulmonary disease, unspecified: Secondary | ICD-10-CM | POA: Diagnosis not present

## 2023-05-08 DIAGNOSIS — Z6841 Body Mass Index (BMI) 40.0 and over, adult: Secondary | ICD-10-CM | POA: Diagnosis not present

## 2023-05-08 DIAGNOSIS — Z79890 Hormone replacement therapy (postmenopausal): Secondary | ICD-10-CM | POA: Diagnosis not present

## 2023-05-08 DIAGNOSIS — E039 Hypothyroidism, unspecified: Secondary | ICD-10-CM | POA: Diagnosis not present

## 2023-05-08 DIAGNOSIS — E785 Hyperlipidemia, unspecified: Secondary | ICD-10-CM | POA: Diagnosis not present

## 2023-05-08 NOTE — Therapy (Signed)
OUTPATIENT PHYSICAL THERAPY LOWER EXTREMITY treatment    Patient Name: Bonnie Gonzalez MRN: [id number] DOB:Aug 29, 1956, 67 y.o., female Today's Date: 05/08/2023  END OF SESSION:  PT End of Session - 05/08/23 1305     Visit Number 24    Number of Visits 32    Date for PT Re-Evaluation 06/02/23    Authorization Type done at 20    PT Start Time 1302    PT Stop Time 1343    PT Time Calculation (min) 41 min    Activity Tolerance Patient tolerated treatment well    Behavior During Therapy [facility] for tasks assessed/performed                 Past Medical History:  Diagnosis Date   Acquired hypothyroidism 10/01/2010   Allergic rhinitis 07/20/2009   Arthritis    Back pain 10/18/2015   Bilateral leg edema 03/31/2018   Centrilobular emphysema 02/04/2019   HRCT 09/15/18   Chronic diastolic CHF (congestive heart failure) 01/28/2019   Chronic respiratory failure with hypoxia 02/04/2019   6 Min Walk 08/31/18 at [facility]   Chronic urticaria 12/21/2019   COPD (chronic obstructive pulmonary disease) 09/01/2018   Quit smoking 2010  - Spirometry 08/31/2018  FEV1 1.1 (51%)  Ratio 66 s prior rx with typical curvature  - 08/31/2018  After extensive coaching inhaler device,  effectiveness =    90% with elipta > try anoro sample > no better so d/c'  - PFT's  11/10/2018  FEV1 1.24 (59 % ) ratio 75 (ratio with fev1/VC =  63)  p no % improvement from saba p nothing prior to study with DLCO  105  % corrects to 132  %    DOE (dyspnea on exertion) 08/31/2018   Onset 05/2018 with background of unexplained leg swelling x 01/2018   -echo 04/13/18 just G1   diastolic dysfunction s PH   -  08/31/2018   Walked RA x one lap @ 185 stopped due to  Sob/ sats 89% at nl pace  - Spirometry 08/31/2018  FEV1 1.1 (51%)  Ratio 66 s prior rx  - 09/28/2018 could not do full pfts 09/28/2018  - Collagen vasc profile 09/28/18  For ESR =  54 (was 102 before prednisone)  Neg collag   Dyshidrotic eczema 12/13/2010   Essential  hypertension 01/26/2019   Generalized anxiety disorder with panic attacks 01/20/2018   GERD (gastroesophageal reflux disease) 11/03/2007   Headache 11/03/2007   Irritable bowel syndrome 07/13/2009   Major depressive disorder 07/17/2020   Mixed hyperlipidemia 07/20/2009   Obesity    OSA (obstructive sleep apnea) 07/26/2015   HST 08/2015 mild OSA, AHI 8/hour, lowest desaturation 81%, desaturations noted without respiratory events  Formatting of this note might be different from the original. PSG 05/20/19 AHI 25.1   Osteopenia of multiple sites 06/01/2019   DEXA 05/24/2019: Right femur neck T- 2.2, left femur neck T- 1.5, right total femur T -0.5, left total femur T -0.5, AP total spine T- 1.9. FRAX 10-year probability of major osteoporotic fracture is 8.8% and a hip fracture is 1.2%  DEXA 05/05/2019: Right femur neck T- 1.9, left femur neck T- 2.0, right total femur T -1.3, left total femur   Oxygen deficiency    Postinflammatory pulmonary fibrosis 09/15/2018   ESR    10/161/9  = 102   > rx short term prednisone ? slt improvement in symptoms HRCT 09/15/2018  1. Very mild basilar subpleural ground-glass, reticulation and traction bronchiolectasis, findings  which may be minimally progressive from 06/29/2017 and are indicative of interstitial lung disease such as nonspecific interstitial pneumonitis. Findings are indeterminate for UIP per consensus guidelin   Psychophysiological insomnia 03/29/2019   Type 2 diabetes mellitus 02/09/2017   UTI (urinary tract infection)    Vertigo 03/29/2014   Vitamin D deficiency 09/15/2020   Past Surgical History:  Procedure Laterality Date   ANKLE FRACTURE SURGERY Right    benign tumor      removed from groin   COLONOSCOPY WITH PROPOFOL N/A 05/15/2022   per Dr. Lavon Paganini, adenomatous polyps, repeat in 5 yrs   EYE SURGERY     HOT HEMOSTASIS N/A 05/15/2022   Procedure: HOT HEMOSTASIS (ARGON PLASMA COAGULATION/BICAP);  Surgeon: Napoleon Form, MD;  Location: [facility];  Service: Gastroenterology;  Laterality: N/A;   POLYPECTOMY  05/15/2022   Procedure: POLYPECTOMY;  Surgeon: Napoleon Form, MD;  Location: [facility];  Service: Gastroenterology;;   WRIST ARTHROCENTESIS N/A 2001   Patient Active Problem List   Diagnosis Date Noted   Morbid obesity (HCC) 06/16/2022   Physical deconditioning 06/16/2022   AVM (arteriovenous malformation) of colon, acquired with hemorrhage    Positive colorectal cancer screening using Cologuard test    Polyp of sigmoid colon    Polyp of transverse colon    Polyp of cecum    COVID-19 virus infection 11/21/2021   Vitamin D deficiency 09/15/2020   Major depressive disorder 07/17/2020   Chronic urticaria 12/21/2019   Osteopenia of multiple sites 06/01/2019   Psychophysiological insomnia 03/29/2019   Centrilobular emphysema 02/04/2019   Chronic respiratory failure with hypoxia 02/04/2019   Chronic diastolic CHF (congestive heart failure) 01/28/2019   Essential hypertension 01/26/2019   Postinflammatory pulmonary fibrosis 09/15/2018   COPD (chronic obstructive pulmonary disease) 09/01/2018   DOE (dyspnea on exertion) 08/31/2018   Bilateral leg edema 03/31/2018   Generalized anxiety disorder with panic attacks 01/20/2018   Type 2 diabetes mellitus 02/09/2017   Back pain 10/18/2015   OSA (obstructive sleep apnea) 07/26/2015   Vertigo 03/29/2014   Dyshidrotic eczema 12/13/2010   Acquired hypothyroidism 10/01/2010   Mixed hyperlipidemia 07/20/2009   Allergic rhinitis 07/20/2009   Irritable bowel syndrome 07/13/2009   GERD (gastroesophageal reflux disease) 11/03/2007   Headache 11/03/2007    PCP: Gershon Crane MD   REFERRING PROVIDER: Gershon Crane MD   REFERRING DIAG:  Diagnosis  J96.11 (ICD-10-CM) - Chronic respiratory failure with hypoxia (HCC)    THERAPY DIAG:  Physical deconditioning  Other abnormalities of gait and mobility  Rationale for Evaluation and Treatment: Rehabilitation  ONSET  DATE: has been on O2 for 3 years   SUBJECTIVE:   SUBJECTIVE STATEMENT: Pt reports she has been attending the [employer] regularly and is enjoying it. Saw pulmonologist yesterday who wants to run some heart tests.    PERTINENT HISTORY: Knees, Loweback, and wrist arthritis. CHF taking lasix, post inflamitory pulmonary fibrosis; vertigo ( still has syncope at times)  PAIN:  Are you having pain? Yes: NPRS scale: 2/10 Pain location:Right knee worse than left  Pain description: Aching Aggravating factors: Standing walking Relieving factors: Sitting  PRECAUTIONS: falls   WEIGHT BEARING RESTRICTIONS: No  FALLS:  Has patient fallen in last 6 months? No but has ad some near falls   LIVING ENVIRONMENT: Ramp into the house   OCCUPATION: retired  Recreation: would like to be able to get back to gardening  PLOF: Independent with household mobility with device  PATIENT GOALS: To increase endurance  NEXT MD  VISIT: Nothing scheduled  OBJECTIVE:   DIAGNOSTIC FINDINGS: Nothing of the knee   COGNITION: Overall cognitive status: Within functional limits for tasks assessed     SENSATION: Has had tinglining in her hands. Has been using wrist guards   MUSCLE LENGTH:  POSTURE: rounded shoulders, forward head, and flexed trunk    LOWER EXTREMITY ROM:  Passive ROM Right eval Left eval  Hip flexion    Hip extension    Hip abduction    Hip adduction    Hip internal rotation    Hip external rotation    Knee flexion  Painful at end range  Knee extension    Ankle dorsiflexion    Ankle plantarflexion    Ankle inversion    Ankle eversion     (Blank rows = not tested)  LOWER EXTREMITY MMT:  MMT Right eval Left eval Right 4/8 Left 4//8 right Left 5/20  Hip flexion 19.7 17.0 33.4 29 36. 35.9  Hip extension        Hip abduction 36.9 40.3 35 40.9 47.8 44.9  Hip adduction        Hip internal rotation        Hip external rotation        Knee flexion        Knee extension 20 18.7  45 35 44.7 56.5  Ankle dorsiflexion        Ankle plantarflexion        Ankle inversion        Ankle eversion         (Blank rows = not tested)  LOWER EXTREMITY SPECIAL TESTS:   IE: 5x sit to stand: 24 sec   Hr 85  Sao2 96   01/26/23: walk test: 251ft total- seated rest break at 159ft and paused timer. 4/15: 5xSTS-21sec  5/20   GAIT:  TODAY'S TREATMENT:                                                                                                                               DATE:   6/7 Hip abduction machine 55 pounds 3 x 15 Hip Adduction machine 40 lbs 3x15  Row machine 3x10 25# Abdominal crunch machine 40# 2x10 Lumbar extension machine 55# 2x10  Ambulation 300' with no seated rest break. Sao2 down to 92%   Nu-step L4 7  min Sao2  95%  6/5 Hip abduction machine 55 pounds 3 x 15 Hip Adduction machine 40 lbs 3x15  Row machine 3x15   Ambulation 300' with no seated rest break. Sao2 down to 88    Nu-step L4 7  min Sao2 remained> 93%     5/28 Hamstring curl LF 20 lbs 2x15  Cybex leg press 3x10 55 lbs  Hip abduction machine 55 pounds 3 x 15  Bicep curls 3x10 3lbs Chest Press 3x10 3lbs Overhead press 3x10 3lbs   Ambulation 300' with 1 seated rest break. Sao2 down to 84%  Nu-step L4 6 min Sao2 remained> 93%   PATIENT EDUCATION:  Education details: HEP, symptom management, activity progression Person educated: Patient Education method: Explanation, Demonstration, Tactile cues, and Verbal cues Education comprehension: verbalized understanding, returned demonstration, verbal cues required, tactile cues required, and needs further education  HOME EXERCISE PROGRAM: Access Code: [email or web address] URL: [email or web address] Date: 01/19/2023 Prepared by: Riki Altes  Exercises - Heel Raises with Counter Support  - 1-2 x daily - 7 x weekly - 2 sets - 10 reps - Standing March with Counter Support  - 1-2 x daily - 7 x weekly - 2 sets - 10 reps -  Standing Hip Abduction with Counter Support  - 1-2 x daily - 7 x weekly - 2 sets - 10 reps - Standing Hip Extension with Counter Support  - 1-2 x daily - 7 x weekly - 2 sets - 10 reps - Mini Squat with Counter Support  - 1-2 x daily - 7 x weekly - 2 sets - 10 reps - Standing Knee Flexion with Counter Support  - 1-2 x daily - 7 x weekly - 2 sets - 10 reps  ASSESSMENT:  CLINICAL IMPRESSION: Did have some knee pain with walking, so montiored this throuhgout session. Great tolerance for gym machine program and pt did not have decrease in O2 with this. Again able to complete 327ft in clinic without rest break. O2% decreased to 92% following this. Pt has exhibited improvements in O2 saturation with exercise recently. Will continue to monitor this.   OBJECTIVE IMPAIRMENTS: Abnormal gait, cardiopulmonary status limiting activity, decreased activity tolerance, decreased balance, decreased endurance, decreased mobility, difficulty walking, decreased ROM, decreased strength, and pain.   ACTIVITY LIMITATIONS: carrying, lifting, bending, standing, squatting, stairs, transfers, bathing, dressing, and locomotion level  PARTICIPATION LIMITATIONS: meal prep, cleaning, laundry, driving, shopping, community activity, and yard work  PERSONAL FACTORS: 3+ comorbidities: Knees, Loweback, and wrist arthritis. CHF taking lasix, post inflamitory pulmonary fibrosis; vertigo ( still has syncope at times) are also affecting patient's functional outcome.   REHAB POTENTIAL: Good  CLINICAL DECISION MAKING: Evolving/moderate complexity declining overall mobility   EVALUATION COMPLEXITY: Moderate   GOALS: Goals reviewed with patient? Yes  SHORT TERM GOALS: Target date: 02/13/2023   Therapy will perform 6-minute walk test with patient and come up with baseline goals Baseline: Goal status: MET 01/26/23  2.  Patient will decrease sit to stand time time less than 12 seconds  baseline:  Goal status: Goal achieved and  revised 5/20  3.  Patient will perform 10 minutes straight exercise on NuStep maintaining a heart rate less than 110 Baseline:  Goal status: Met 4/8 revised on   LONG TERM GOALS: Target date: 03/13/2023    Patient will have a complete exercise program that she can take to [employer] and continue long-term Baseline:  Goal status: progressing towards program continue to work on machines 5/20  2.  Patient will ambulate 600 feet without self-report of dyspnea in order to attend appointments without being short of breath Baseline:  Goal status: 300 feet progressing 3.  Patient will stand for greater than 20 minutes without increased pain in left knee and increased dyspnea in order to perform ADLs Baseline:  Goal status: In progress 5/20    PLAN:  PT FREQUENCY: 2x/week  PT DURATION: 8 weeks  PLANNED INTERVENTIONS: Therapeutic exercises, Therapeutic activity, Neuromuscular re-education, Balance training, Gait training, Patient/Family education, Self Care, Joint mobilization, Stair training, DME instructions, Aquatic Therapy, Dry Needling, Electrical stimulation, Spinal mobilization, Cryotherapy, Moist  heat, Ultrasound, and Manual therapy  PLAN FOR NEXT SESSION: Continue to progress patient HEP. Consider 2 laps of gait training. Consider standing exercise; mini squats and standing marches on foam pad to work on balance. Consider forward step ups with 2in.  Riki Altes, PTA   05/08/2023, 3:39 PM

## 2023-05-11 ENCOUNTER — Other Ambulatory Visit: Payer: Self-pay | Admitting: Family Medicine

## 2023-05-11 ENCOUNTER — Ambulatory Visit (HOSPITAL_BASED_OUTPATIENT_CLINIC_OR_DEPARTMENT_OTHER): Payer: PPO | Admitting: Physical Therapy

## 2023-05-11 DIAGNOSIS — J449 Chronic obstructive pulmonary disease, unspecified: Secondary | ICD-10-CM | POA: Diagnosis not present

## 2023-05-11 DIAGNOSIS — I5032 Chronic diastolic (congestive) heart failure: Secondary | ICD-10-CM | POA: Diagnosis not present

## 2023-05-11 DIAGNOSIS — R5381 Other malaise: Secondary | ICD-10-CM

## 2023-05-11 DIAGNOSIS — R2689 Other abnormalities of gait and mobility: Secondary | ICD-10-CM

## 2023-05-11 NOTE — Therapy (Unsigned)
OUTPATIENT PHYSICAL THERAPY LOWER EXTREMITY treatment    Patient Name: Ambert Nadege Pecot MRN: [id number] DOB:[birth year], 67 y.o., female Today's Date: 05/12/2023  END OF SESSION:  PT End of Session - 05/12/23 0806     Visit Number 25    Number of Visits 32    Date for PT Re-Evaluation 06/02/23    Authorization Type done at 20    PT Start Time 1015    PT Stop Time 1058    PT Time Calculation (min) 43 min    Activity Tolerance Patient tolerated treatment well    Behavior During Therapy [facility] for tasks assessed/performed                  Past Medical History:  Diagnosis Date   Acquired hypothyroidism 10/01/2010   Allergic rhinitis 07/20/2009   Arthritis    Back pain 10/18/2015   Bilateral leg edema 03/31/2018   Centrilobular emphysema 02/04/2019   HRCT 09/15/18   Chronic diastolic CHF (congestive heart failure) 01/28/2019   Chronic respiratory failure with hypoxia 02/04/2019   6 Min Walk 08/31/18 at [facility]   Chronic urticaria 12/21/2019   COPD (chronic obstructive pulmonary disease) 09/01/2018   Quit smoking 2010  - Spirometry 08/31/2018  FEV1 1.1 (51%)  Ratio 66 s prior rx with typical curvature  - 08/31/2018  After extensive coaching inhaler device,  effectiveness =    90% with elipta > try anoro sample > no better so d/c'  - PFT's  11/10/2018  FEV1 1.24 (59 % ) ratio 75 (ratio with fev1/VC =  63)  p no % improvement from saba p nothing prior to study with DLCO  105  % corrects to 132  %    DOE (dyspnea on exertion) 08/31/2018   Onset 05/2018 with background of unexplained leg swelling x 01/2018   -echo 04/13/18 just G1   diastolic dysfunction s PH   -  08/31/2018   Walked RA x one lap @ 185 stopped due to  Sob/ sats 89% at nl pace  - Spirometry 08/31/2018  FEV1 1.1 (51%)  Ratio 66 s prior rx  - 09/28/2018 could not do full pfts 09/28/2018  - Collagen vasc profile 09/28/18  For ESR =  54 (was 102 before prednisone)  Neg collag   Dyshidrotic eczema 12/13/2010    Essential hypertension 01/26/2019   Generalized anxiety disorder with panic attacks 01/20/2018   GERD (gastroesophageal reflux disease) 11/03/2007   Headache 11/03/2007   Irritable bowel syndrome 07/13/2009   Major depressive disorder 07/17/2020   Mixed hyperlipidemia 07/20/2009   Obesity    OSA (obstructive sleep apnea) 07/26/2015   HST 08/2015 mild OSA, AHI 8/hour, lowest desaturation 81%, desaturations noted without respiratory events  Formatting of this note might be different from the original. PSG 05/20/19 AHI 25.1   Osteopenia of multiple sites 06/01/2019   DEXA 05/24/2019: Right femur neck T- 2.2, left femur neck T- 1.5, right total femur T -0.5, left total femur T -0.5, AP total spine T- 1.9. FRAX 10-year probability of major osteoporotic fracture is 8.8% and a hip fracture is 1.2%  DEXA 05/05/2019: Right femur neck T- 1.9, left femur neck T- 2.0, right total femur T -1.3, left total femur   Oxygen deficiency    Postinflammatory pulmonary fibrosis 09/15/2018   ESR    10/161/9  = 102   > rx short term prednisone ? slt improvement in symptoms HRCT 09/15/2018  1. Very mild basilar subpleural ground-glass, reticulation and traction bronchiolectasis,  findings which may be minimally progressive from 06/29/2017 and are indicative of interstitial lung disease such as nonspecific interstitial pneumonitis. Findings are indeterminate for UIP per consensus guidelin   Psychophysiological insomnia 03/29/2019   Type 2 diabetes mellitus 02/09/2017   UTI (urinary tract infection)    Vertigo 03/29/2014   Vitamin D deficiency 09/15/2020   Past Surgical History:  Procedure Laterality Date   ANKLE FRACTURE SURGERY Right    benign tumor      removed from groin   COLONOSCOPY WITH PROPOFOL N/A 05/15/2022   per Dr. Lavon Paganini, adenomatous polyps, repeat in 5 yrs   EYE SURGERY     HOT HEMOSTASIS N/A 05/15/2022   Procedure: HOT HEMOSTASIS (ARGON PLASMA COAGULATION/BICAP);  Surgeon: Napoleon Form, MD;   Location: [facility];  Service: Gastroenterology;  Laterality: N/A;   POLYPECTOMY  05/15/2022   Procedure: POLYPECTOMY;  Surgeon: Napoleon Form, MD;  Location: [facility];  Service: Gastroenterology;;   WRIST ARTHROCENTESIS N/A 2001   Patient Active Problem List   Diagnosis Date Noted   Morbid obesity (HCC) 06/16/2022   Physical deconditioning 06/16/2022   AVM (arteriovenous malformation) of colon, acquired with hemorrhage    Positive colorectal cancer screening using Cologuard test    Polyp of sigmoid colon    Polyp of transverse colon    Polyp of cecum    COVID-19 virus infection 11/21/2021   Vitamin D deficiency 09/15/2020   Major depressive disorder 07/17/2020   Chronic urticaria 12/21/2019   Osteopenia of multiple sites 06/01/2019   Psychophysiological insomnia 03/29/2019   Centrilobular emphysema 02/04/2019   Chronic respiratory failure with hypoxia 02/04/2019   Chronic diastolic CHF (congestive heart failure) 01/28/2019   Essential hypertension 01/26/2019   Postinflammatory pulmonary fibrosis 09/15/2018   COPD (chronic obstructive pulmonary disease) 09/01/2018   DOE (dyspnea on exertion) 08/31/2018   Bilateral leg edema 03/31/2018   Generalized anxiety disorder with panic attacks 01/20/2018   Type 2 diabetes mellitus 02/09/2017   Back pain 10/18/2015   OSA (obstructive sleep apnea) 07/26/2015   Vertigo 03/29/2014   Dyshidrotic eczema 12/13/2010   Acquired hypothyroidism 10/01/2010   Mixed hyperlipidemia 07/20/2009   Allergic rhinitis 07/20/2009   Irritable bowel syndrome 07/13/2009   GERD (gastroesophageal reflux disease) 11/03/2007   Headache 11/03/2007    PCP: Gershon Crane MD   REFERRING PROVIDER: Gershon Crane MD   REFERRING DIAG:  Diagnosis  J96.11 (ICD-10-CM) - Chronic respiratory failure with hypoxia (HCC)    THERAPY DIAG:  Physical deconditioning  Other abnormalities of gait and mobility  Rationale for Evaluation and Treatment:  Rehabilitation  ONSET DATE: has been on O2 for 3 years   SUBJECTIVE:   SUBJECTIVE STATEMENT: The patient continues to go to the [facility]. She has been doing well. She does not feel any significant difference with her endurance yet.    PERTINENT HISTORY: Knees, Loweback, and wrist arthritis. CHF taking lasix, post inflamitory pulmonary fibrosis; vertigo ( still has syncope at times)  PAIN:  Are you having pain? Yes: NPRS scale: 2/10 Pain location:Right knee worse than left  Pain description: Aching Aggravating factors: Standing walking Relieving factors: Sitting  PRECAUTIONS: falls   WEIGHT BEARING RESTRICTIONS: No  FALLS:  Has patient fallen in last 6 months? No but has ad some near falls   LIVING ENVIRONMENT: Ramp into the house   OCCUPATION: retired  Recreation: would like to be able to get back to gardening  PLOF: Independent with household mobility with device  PATIENT GOALS: To increase endurance  NEXT MD VISIT: Nothing scheduled  OBJECTIVE:   DIAGNOSTIC FINDINGS: Nothing of the knee   COGNITION: Overall cognitive status: Within functional limits for tasks assessed     SENSATION: Has had tinglining in her hands. Has been using wrist guards   MUSCLE LENGTH:  POSTURE: rounded shoulders, forward head, and flexed trunk    LOWER EXTREMITY ROM:  Passive ROM Right eval Left eval  Hip flexion    Hip extension    Hip abduction    Hip adduction    Hip internal rotation    Hip external rotation    Knee flexion  Painful at end range  Knee extension    Ankle dorsiflexion    Ankle plantarflexion    Ankle inversion    Ankle eversion     (Blank rows = not tested)  LOWER EXTREMITY MMT:  MMT Right eval Left eval Right 4/8 Left 4//8 right Left 5/20  Hip flexion 19.7 17.0 33.4 29 36. 35.9  Hip extension        Hip abduction 36.9 40.3 35 40.9 47.8 44.9  Hip adduction        Hip internal rotation        Hip external rotation        Knee flexion         Knee extension 20 18.7 45 35 44.7 56.5  Ankle dorsiflexion        Ankle plantarflexion        Ankle inversion        Ankle eversion         (Blank rows = not tested)  LOWER EXTREMITY SPECIAL TESTS:   IE: 5x sit to stand: 24 sec   Hr 85  Sao2 96   01/26/23: walk test: 2107ft total- seated rest break at 133ft and paused timer. 4/15: 5xSTS-21sec  5/20   GAIT:  TODAY'S TREATMENT:                                                                                                                               DATE:  6/11 Hip abduction machine 55 pounds 3 x 15 Hip Adduction machine 40 lbs 3x15  Row machine 3x10 25# Lumbar extension machine 55# 2x10  Ambulation 300' with 1 seated rest break. Sao2 down to 92%   Nu-step L4 7  min Sao2  95%  6/7 Hip abduction machine 55 pounds 3 x 15 Hip Adduction machine 40 lbs 3x15  Row machine 3x10 25# Abdominal crunch machine 40# 2x10 Lumbar extension machine 55# 2x10  Ambulation 300' with no seated rest break. Sao2 down to 92%   Nu-step L4 7  min Sao2  95%  6/5 Hip abduction machine 55 pounds 3 x 15 Hip Adduction machine 40 lbs 3x15  Row machine 3x15   Ambulation 300' with no seated rest break. Sao2 down to 88    Nu-step L4 7  min Sao2 remained> 93%     5/28  Hamstring curl LF 20 lbs 2x15  Cybex leg press 3x10 55 lbs  Hip abduction machine 55 pounds 3 x 15  Bicep curls 3x10 3lbs Chest Press 3x10 3lbs Overhead press 3x10 3lbs   Ambulation 300' with 1 seated rest break. Sao2 down to 84%  Nu-step L4 6 min Sao2 remained> 93%   PATIENT EDUCATION:  Education details: HEP, symptom management, activity progression Person educated: Patient Education method: Explanation, Demonstration, Tactile cues, and Verbal cues Education comprehension: verbalized understanding, returned demonstration, verbal cues required, tactile cues required, and needs further education  HOME EXERCISE PROGRAM: Access Code: [email or web address] URL:  [email or web address] Date: 01/19/2023 Prepared by: Riki Altes  Exercises - Heel Raises with Counter Support  - 1-2 x daily - 7 x weekly - 2 sets - 10 reps - Standing March with Counter Support  - 1-2 x daily - 7 x weekly - 2 sets - 10 reps - Standing Hip Abduction with Counter Support  - 1-2 x daily - 7 x weekly - 2 sets - 10 reps - Standing Hip Extension with Counter Support  - 1-2 x daily - 7 x weekly - 2 sets - 10 reps - Mini Squat with Counter Support  - 1-2 x daily - 7 x weekly - 2 sets - 10 reps - Standing Knee Flexion with Counter Support  - 1-2 x daily - 7 x weekly - 2 sets - 10 reps  ASSESSMENT:  CLINICAL IMPRESSION: The patient needed a seated rest break with ambulation today but overall did well.  She is becoming more independent with her set-up and weights in the gym. She is nearing independence with her gym program. We will likely D/C soon.   OBJECTIVE IMPAIRMENTS: Abnormal gait, cardiopulmonary status limiting activity, decreased activity tolerance, decreased balance, decreased endurance, decreased mobility, difficulty walking, decreased ROM, decreased strength, and pain.   ACTIVITY LIMITATIONS: carrying, lifting, bending, standing, squatting, stairs, transfers, bathing, dressing, and locomotion level  PARTICIPATION LIMITATIONS: meal prep, cleaning, laundry, driving, shopping, community activity, and yard work  PERSONAL FACTORS: 3+ comorbidities: Knees, Loweback, and wrist arthritis. CHF taking lasix, post inflamitory pulmonary fibrosis; vertigo ( still has syncope at times) are also affecting patient's functional outcome.   REHAB POTENTIAL: Good  CLINICAL DECISION MAKING: Evolving/moderate complexity declining overall mobility   EVALUATION COMPLEXITY: Moderate   GOALS: Goals reviewed with patient? Yes  SHORT TERM GOALS: Target date: 02/13/2023   Therapy will perform 6-minute walk test with patient and come up with baseline goals Baseline: Goal  status: MET 01/26/23  2.  Patient will decrease sit to stand time time less than 12 seconds  baseline:  Goal status: Goal achieved and revised 5/20  3.  Patient will perform 10 minutes straight exercise on NuStep maintaining a heart rate less than 110 Baseline:  Goal status: Met 4/8 revised on   LONG TERM GOALS: Target date: 03/13/2023    Patient will have a complete exercise program that she can take to [employer] and continue long-term Baseline:  Goal status: progressing towards program continue to work on machines 5/20  2.  Patient will ambulate 600 feet without self-report of dyspnea in order to attend appointments without being short of breath Baseline:  Goal status: 300 feet progressing 3.  Patient will stand for greater than 20 minutes without increased pain in left knee and increased dyspnea in order to perform ADLs Baseline:  Goal status: In progress 5/20    PLAN:  PT FREQUENCY: 2x/week  PT DURATION: 8 weeks  PLANNED  INTERVENTIONS: Therapeutic exercises, Therapeutic activity, Neuromuscular re-education, Balance training, Gait training, Patient/Family education, Self Care, Joint mobilization, Stair training, DME instructions, Aquatic Therapy, Dry Needling, Electrical stimulation, Spinal mobilization, Cryotherapy, Moist heat, Ultrasound, and Manual therapy  PLAN FOR NEXT SESSION: Continue to progress patient HEP. Consider 2 laps of gait training. Consider standing exercise; mini squats and standing marches on foam pad to work on balance. Consider forward step ups with 2in.  Lorayne Bender PT DPT    05/12/2023, 8:30 AM

## 2023-05-12 ENCOUNTER — Encounter (HOSPITAL_BASED_OUTPATIENT_CLINIC_OR_DEPARTMENT_OTHER): Payer: Self-pay | Admitting: Physical Therapy

## 2023-05-12 ENCOUNTER — Telehealth: Payer: Self-pay | Admitting: Family Medicine

## 2023-05-12 NOTE — Telephone Encounter (Signed)
Please call in Nexium 40 mg BID, #180 with 3 rf

## 2023-05-12 NOTE — Telephone Encounter (Signed)
Pt call and stated she want dr.Fry to send the esomeprazole 40mg  and some extra  20mg  so she can have them because she use more.pt stated you can call her back.

## 2023-05-12 NOTE — Telephone Encounter (Signed)
Pt is requesting to go back to taking Nexium 40 mg BID. Pt is currently taking 20 mg daily. Please advise

## 2023-05-13 ENCOUNTER — Other Ambulatory Visit: Payer: Self-pay

## 2023-05-13 MED ORDER — ESOMEPRAZOLE MAGNESIUM 40 MG PO CPDR: 40.0000 mg | DELAYED_RELEASE_CAPSULE | Freq: Two times a day (BID) | ORAL | 3 refills | Status: DC

## 2023-05-13 NOTE — Telephone Encounter (Signed)
Pt Rx sent to pt pharmacy, left detailed message for pt to pick up Rx from the pharmacy

## 2023-05-14 ENCOUNTER — Encounter (HOSPITAL_BASED_OUTPATIENT_CLINIC_OR_DEPARTMENT_OTHER): Payer: Self-pay

## 2023-05-14 ENCOUNTER — Ambulatory Visit (HOSPITAL_BASED_OUTPATIENT_CLINIC_OR_DEPARTMENT_OTHER): Payer: PPO

## 2023-05-14 DIAGNOSIS — R5381 Other malaise: Secondary | ICD-10-CM | POA: Diagnosis not present

## 2023-05-14 DIAGNOSIS — R2689 Other abnormalities of gait and mobility: Secondary | ICD-10-CM

## 2023-05-14 NOTE — Therapy (Signed)
OUTPATIENT PHYSICAL THERAPY LOWER EXTREMITY treatment    Patient Name: Bonnie Gonzalez MRN: [id number] DOB:1956/08/06, 67 y.o., female Today's Date: 05/14/2023  END OF SESSION:  PT End of Session - 05/14/23 1152     Visit Number 26    Number of Visits 32    Date for PT Re-Evaluation 06/02/23    Authorization Type done at 20    PT Start Time 1102    PT Stop Time 1148    PT Time Calculation (min) 46 min    Activity Tolerance Patient tolerated treatment well    Behavior During Therapy [facility] for tasks assessed/performed                   Past Medical History:  Diagnosis Date   Acquired hypothyroidism 10/01/2010   Allergic rhinitis 07/20/2009   Arthritis    Back pain 10/18/2015   Bilateral leg edema 03/31/2018   Centrilobular emphysema 02/04/2019   HRCT 09/15/18   Chronic diastolic CHF (congestive heart failure) 01/28/2019   Chronic respiratory failure with hypoxia 02/04/2019   6 Min Walk 08/31/18 at [facility]   Chronic urticaria 12/21/2019   COPD (chronic obstructive pulmonary disease) 09/01/2018   Quit smoking 2010  - Spirometry 08/31/2018  FEV1 1.1 (51%)  Ratio 66 s prior rx with typical curvature  - 08/31/2018  After extensive coaching inhaler device,  effectiveness =    90% with elipta > try anoro sample > no better so d/c'  - PFT's  11/10/2018  FEV1 1.24 (59 % ) ratio 75 (ratio with fev1/VC =  63)  p no % improvement from saba p nothing prior to study with DLCO  105  % corrects to 132  %    DOE (dyspnea on exertion) 08/31/2018   Onset 05/2018 with background of unexplained leg swelling x 01/2018   -echo 04/13/18 just G1   diastolic dysfunction s PH   -  08/31/2018   Walked RA x one lap @ 185 stopped due to  Sob/ sats 89% at nl pace  - Spirometry 08/31/2018  FEV1 1.1 (51%)  Ratio 66 s prior rx  - 09/28/2018 could not do full pfts 09/28/2018  - Collagen vasc profile 09/28/18  For ESR =  54 (was 102 before prednisone)  Neg collag   Dyshidrotic eczema 12/13/2010    Essential hypertension 01/26/2019   Generalized anxiety disorder with panic attacks 01/20/2018   GERD (gastroesophageal reflux disease) 11/03/2007   Headache 11/03/2007   Irritable bowel syndrome 07/13/2009   Major depressive disorder 07/17/2020   Mixed hyperlipidemia 07/20/2009   Obesity    OSA (obstructive sleep apnea) 07/26/2015   HST 08/2015 mild OSA, AHI 8/hour, lowest desaturation 81%, desaturations noted without respiratory events  Formatting of this note might be different from the original. PSG 05/20/19 AHI 25.1   Osteopenia of multiple sites 06/01/2019   DEXA 05/24/2019: Right femur neck T- 2.2, left femur neck T- 1.5, right total femur T -0.5, left total femur T -0.5, AP total spine T- 1.9. FRAX 10-year probability of major osteoporotic fracture is 8.8% and a hip fracture is 1.2%  DEXA 05/05/2019: Right femur neck T- 1.9, left femur neck T- 2.0, right total femur T -1.3, left total femur   Oxygen deficiency    Postinflammatory pulmonary fibrosis 09/15/2018   ESR    10/161/9  = 102   > rx short term prednisone ? slt improvement in symptoms HRCT 09/15/2018  1. Very mild basilar subpleural ground-glass, reticulation and traction  bronchiolectasis, findings which may be minimally progressive from 06/29/2017 and are indicative of interstitial lung disease such as nonspecific interstitial pneumonitis. Findings are indeterminate for UIP per consensus guidelin   Psychophysiological insomnia 03/29/2019   Type 2 diabetes mellitus 02/09/2017   UTI (urinary tract infection)    Vertigo 03/29/2014   Vitamin D deficiency 09/15/2020   Past Surgical History:  Procedure Laterality Date   ANKLE FRACTURE SURGERY Right    benign tumor      removed from groin   COLONOSCOPY WITH PROPOFOL N/A 05/15/2022   per Dr. Lavon Paganini, adenomatous polyps, repeat in 5 yrs   EYE SURGERY     HOT HEMOSTASIS N/A 05/15/2022   Procedure: HOT HEMOSTASIS (ARGON PLASMA COAGULATION/BICAP);  Surgeon: Napoleon Form, MD;   Location: [facility];  Service: Gastroenterology;  Laterality: N/A;   POLYPECTOMY  05/15/2022   Procedure: POLYPECTOMY;  Surgeon: Napoleon Form, MD;  Location: [facility];  Service: Gastroenterology;;   WRIST ARTHROCENTESIS N/A 2001   Patient Active Problem List   Diagnosis Date Noted   Morbid obesity (HCC) 06/16/2022   Physical deconditioning 06/16/2022   AVM (arteriovenous malformation) of colon, acquired with hemorrhage    Positive colorectal cancer screening using Cologuard test    Polyp of sigmoid colon    Polyp of transverse colon    Polyp of cecum    COVID-19 virus infection 11/21/2021   Vitamin D deficiency 09/15/2020   Major depressive disorder 07/17/2020   Chronic urticaria 12/21/2019   Osteopenia of multiple sites 06/01/2019   Psychophysiological insomnia 03/29/2019   Centrilobular emphysema 02/04/2019   Chronic respiratory failure with hypoxia 02/04/2019   Chronic diastolic CHF (congestive heart failure) 01/28/2019   Essential hypertension 01/26/2019   Postinflammatory pulmonary fibrosis 09/15/2018   COPD (chronic obstructive pulmonary disease) 09/01/2018   DOE (dyspnea on exertion) 08/31/2018   Bilateral leg edema 03/31/2018   Generalized anxiety disorder with panic attacks 01/20/2018   Type 2 diabetes mellitus 02/09/2017   Back pain 10/18/2015   OSA (obstructive sleep apnea) 07/26/2015   Vertigo 03/29/2014   Dyshidrotic eczema 12/13/2010   Acquired hypothyroidism 10/01/2010   Mixed hyperlipidemia 07/20/2009   Allergic rhinitis 07/20/2009   Irritable bowel syndrome 07/13/2009   GERD (gastroesophageal reflux disease) 11/03/2007   Headache 11/03/2007    PCP: Gershon Crane MD   REFERRING PROVIDER: Gershon Crane MD   REFERRING DIAG:  Diagnosis  J96.11 (ICD-10-CM) - Chronic respiratory failure with hypoxia (HCC)    THERAPY DIAG:  Physical deconditioning  Other abnormalities of gait and mobility  Rationale for Evaluation and Treatment:  Rehabilitation  ONSET DATE: has been on O2 for 3 years   SUBJECTIVE:   SUBJECTIVE STATEMENT: The patient continues to go to the [facility]. She has been doing well. She does not feel any significant difference with her endurance yet.    PERTINENT HISTORY: Knees, Loweback, and wrist arthritis. CHF taking lasix, post inflamitory pulmonary fibrosis; vertigo ( still has syncope at times)  PAIN:  Are you having pain? Yes: NPRS scale: 2/10 Pain location:Right knee worse than left  Pain description: Aching Aggravating factors: Standing walking Relieving factors: Sitting  PRECAUTIONS: falls   WEIGHT BEARING RESTRICTIONS: No  FALLS:  Has patient fallen in last 6 months? No but has ad some near falls   LIVING ENVIRONMENT: Ramp into the house   OCCUPATION: retired  Recreation: would like to be able to get back to gardening  PLOF: Independent with household mobility with device  PATIENT GOALS: To increase endurance  NEXT MD VISIT: Nothing scheduled  OBJECTIVE:   DIAGNOSTIC FINDINGS: Nothing of the knee   COGNITION: Overall cognitive status: Within functional limits for tasks assessed     SENSATION: Has had tinglining in her hands. Has been using wrist guards   MUSCLE LENGTH:  POSTURE: rounded shoulders, forward head, and flexed trunk    LOWER EXTREMITY ROM:  Passive ROM Right eval Left eval  Hip flexion    Hip extension    Hip abduction    Hip adduction    Hip internal rotation    Hip external rotation    Knee flexion  Painful at end range  Knee extension    Ankle dorsiflexion    Ankle plantarflexion    Ankle inversion    Ankle eversion     (Blank rows = not tested)  LOWER EXTREMITY MMT:  MMT Right eval Left eval Right 4/8 Left 4//8 right Left 5/20  Hip flexion 19.7 17.0 33.4 29 36. 35.9  Hip extension        Hip abduction 36.9 40.3 35 40.9 47.8 44.9  Hip adduction        Hip internal rotation        Hip external rotation        Knee flexion         Knee extension 20 18.7 45 35 44.7 56.5  Ankle dorsiflexion        Ankle plantarflexion        Ankle inversion        Ankle eversion         (Blank rows = not tested)  LOWER EXTREMITY SPECIAL TESTS:   IE: 5x sit to stand: 24 sec   Hr 85  Sao2 96   01/26/23: walk test: 269ft total- seated rest break at 19ft and paused timer. 4/15: 5xSTS-21sec    GAIT:  TODAY'S TREATMENT:                                                                                                                               DATE:   6/13 Hip abduction machine 55 pounds 2 x 15, 1x15 at 70lbs Hip Adduction machine 70lbs 2x10, 55lbs 1x10 Row machine 3x10 25# Lumbar extension machine 55# 2x10  Ambulation 300' without break x2 saO2 was 98% after 1st lap, 96% after 2nd lap.     Nu-step L4 7  min Sao2  95%  6/11 Hip abduction machine 55 pounds 3 x 15 Hip Adduction machine 40 lbs 3x15  Row machine 3x10 25# Lumbar extension machine 55# 2x10  Ambulation 300' with 1 seated rest break. Sao2 down to 92%   Nu-step L4 7  min Sao2  95%  6/7 Hip abduction machine 55 pounds 3 x 15 Hip Adduction machine 40 lbs 3x15  Row machine 3x10 25# Abdominal crunch machine 40# 2x10 Lumbar extension machine 55# 2x10  Ambulation 300' with no seated rest break. Sao2 down to 92%  Nu-step L4 7  min Sao2  95%    PATIENT EDUCATION:  Education details: HEP, symptom management, activity progression Person educated: Patient Education method: Explanation, Demonstration, Tactile cues, and Verbal cues Education comprehension: verbalized understanding, returned demonstration, verbal cues required, tactile cues required, and needs further education  HOME EXERCISE PROGRAM: Access Code: [email or web address] URL: [email or web address] Date: 01/19/2023 Prepared by: Riki Altes  Exercises - Heel Raises with Counter Support  - 1-2 x daily - 7 x weekly - 2 sets - 10 reps - Standing March with Counter Support  -  1-2 x daily - 7 x weekly - 2 sets - 10 reps - Standing Hip Abduction with Counter Support  - 1-2 x daily - 7 x weekly - 2 sets - 10 reps - Standing Hip Extension with Counter Support  - 1-2 x daily - 7 x weekly - 2 sets - 10 reps - Mini Squat with Counter Support  - 1-2 x daily - 7 x weekly - 2 sets - 10 reps - Standing Knee Flexion with Counter Support  - 1-2 x daily - 7 x weekly - 2 sets - 10 reps  ASSESSMENT:  CLINICAL IMPRESSION: O2% was 88% at entry prior to exercises. Waited until 95% to initiate exercise program. She was able to ambulate 2x 344ft without rest break, but did have seated breaks in between laps. SaO2 was within appropriate range for this. O2 did drop halfway through machines to 90%, but rose back to 96% quickly.  She is in process of transitioning to gym program at [facility] 3x/week. Plan to d/c next visit due to MMI with PT and pt demonstrating excellent self management of HEP and O2 stats.   OBJECTIVE IMPAIRMENTS: Abnormal gait, cardiopulmonary status limiting activity, decreased activity tolerance, decreased balance, decreased endurance, decreased mobility, difficulty walking, decreased ROM, decreased strength, and pain.   ACTIVITY LIMITATIONS: carrying, lifting, bending, standing, squatting, stairs, transfers, bathing, dressing, and locomotion level  PARTICIPATION LIMITATIONS: meal prep, cleaning, laundry, driving, shopping, community activity, and yard work  PERSONAL FACTORS: 3+ comorbidities: Knees, Loweback, and wrist arthritis. CHF taking lasix, post inflamitory pulmonary fibrosis; vertigo ( still has syncope at times) are also affecting patient's functional outcome.   REHAB POTENTIAL: Good  CLINICAL DECISION MAKING: Evolving/moderate complexity declining overall mobility   EVALUATION COMPLEXITY: Moderate   GOALS: Goals reviewed with patient? Yes  SHORT TERM GOALS: Target date: 02/13/2023   Therapy will perform 6-minute walk test with patient and come up with  baseline goals Baseline: Goal status: MET 01/26/23  2.  Patient will decrease sit to stand time time less than 12 seconds  baseline:  Goal status: Goal achieved and revised 5/20  3.  Patient will perform 10 minutes straight exercise on NuStep maintaining a heart rate less than 110 Baseline:  Goal status: Met 4/8 revised on   LONG TERM GOALS: Target date: 03/13/2023    Patient will have a complete exercise program that she can take to [employer] and continue long-term Baseline:  Goal status: progressing towards program continue to work on machines 5/20  2.  Patient will ambulate 600 feet without self-report of dyspnea in order to attend appointments without being short of breath Baseline:  Goal status: 300 feet progressing 3.  Patient will stand for greater than 20 minutes without increased pain in left knee and increased dyspnea in order to perform ADLs Baseline:  Goal status: In progress 6/13 (Pain in back with standing >2-3 minutes)    PLAN:  PT FREQUENCY: 2x/week  PT DURATION: 8 weeks  PLANNED INTERVENTIONS: Therapeutic exercises, Therapeutic activity, Neuromuscular re-education, Balance training, Gait training, Patient/Family education, Self Care, Joint mobilization, Stair training, DME instructions, Aquatic Therapy, Dry Needling, Electrical stimulation, Spinal mobilization, Cryotherapy, Moist heat, Ultrasound, and Manual therapy  PLAN FOR NEXT SESSION: Continue to progress patient HEP. Consider 2 laps of gait training. Consider standing exercise; mini squats and standing marches on foam pad to work on balance. Consider forward step ups with 2in.  Riki Altes, PTA    05/14/2023, 11:55 AM

## 2023-05-18 ENCOUNTER — Ambulatory Visit (HOSPITAL_BASED_OUTPATIENT_CLINIC_OR_DEPARTMENT_OTHER): Payer: PPO

## 2023-05-18 ENCOUNTER — Encounter (HOSPITAL_BASED_OUTPATIENT_CLINIC_OR_DEPARTMENT_OTHER): Payer: Self-pay

## 2023-05-18 DIAGNOSIS — R2689 Other abnormalities of gait and mobility: Secondary | ICD-10-CM

## 2023-05-18 DIAGNOSIS — R5381 Other malaise: Secondary | ICD-10-CM

## 2023-05-18 NOTE — Therapy (Signed)
OUTPATIENT PHYSICAL THERAPY LOWER EXTREMITY treatment    Patient Name: Bonnie Gonzalez MRN: [id number] DOB:01/15/1956, 68 y.o., female Today's Date: 05/18/2023  END OF SESSION:  PT End of Session - 05/18/23 1025     Visit Number 27    Number of Visits 32    Date for PT Re-Evaluation 06/02/23    Authorization Type done at 20    PT Start Time 1018    PT Stop Time 1058    PT Time Calculation (min) 40 min    Activity Tolerance Patient tolerated treatment well    Behavior During Therapy [facility] for tasks assessed/performed                   Past Medical History:  Diagnosis Date   Acquired hypothyroidism 10/01/2010   Allergic rhinitis 07/20/2009   Arthritis    Back pain 10/18/2015   Bilateral leg edema 03/31/2018   Centrilobular emphysema 02/04/2019   HRCT 09/15/18   Chronic diastolic CHF (congestive heart failure) 01/28/2019   Chronic respiratory failure with hypoxia 02/04/2019   6 Min Walk 08/31/18 at [facility]   Chronic urticaria 12/21/2019   COPD (chronic obstructive pulmonary disease) 09/01/2018   Quit smoking 2010  - Spirometry 08/31/2018  FEV1 1.1 (51%)  Ratio 66 s prior rx with typical curvature  - 08/31/2018  After extensive coaching inhaler device,  effectiveness =    90% with elipta > try anoro sample > no better so d/c'  - PFT's  11/10/2018  FEV1 1.24 (59 % ) ratio 75 (ratio with fev1/VC =  63)  p no % improvement from saba p nothing prior to study with DLCO  105  % corrects to 132  %    DOE (dyspnea on exertion) 08/31/2018   Onset 05/2018 with background of unexplained leg swelling x 01/2018   -echo 04/13/18 just G1   diastolic dysfunction s PH   -  08/31/2018   Walked RA x one lap @ 185 stopped due to  Sob/ sats 89% at nl pace  - Spirometry 08/31/2018  FEV1 1.1 (51%)  Ratio 66 s prior rx  - 09/28/2018 could not do full pfts 09/28/2018  - Collagen vasc profile 09/28/18  For ESR =  54 (was 102 before prednisone)  Neg collag   Dyshidrotic eczema 12/13/2010    Essential hypertension 01/26/2019   Generalized anxiety disorder with panic attacks 01/20/2018   GERD (gastroesophageal reflux disease) 11/03/2007   Headache 11/03/2007   Irritable bowel syndrome 07/13/2009   Major depressive disorder 07/17/2020   Mixed hyperlipidemia 07/20/2009   Obesity    OSA (obstructive sleep apnea) 07/26/2015   HST 08/2015 mild OSA, AHI 8/hour, lowest desaturation 81%, desaturations noted without respiratory events  Formatting of this note might be different from the original. PSG 05/20/19 AHI 25.1   Osteopenia of multiple sites 06/01/2019   DEXA 05/24/2019: Right femur neck T- 2.2, left femur neck T- 1.5, right total femur T -0.5, left total femur T -0.5, AP total spine T- 1.9. FRAX 10-year probability of major osteoporotic fracture is 8.8% and a hip fracture is 1.2%  DEXA 05/05/2019: Right femur neck T- 1.9, left femur neck T- 2.0, right total femur T -1.3, left total femur   Oxygen deficiency    Postinflammatory pulmonary fibrosis 09/15/2018   ESR    10/161/9  = 102   > rx short term prednisone ? slt improvement in symptoms HRCT 09/15/2018  1. Very mild basilar subpleural ground-glass, reticulation and traction  bronchiolectasis, findings which may be minimally progressive from 06/29/2017 and are indicative of interstitial lung disease such as nonspecific interstitial pneumonitis. Findings are indeterminate for UIP per consensus guidelin   Psychophysiological insomnia 03/29/2019   Type 2 diabetes mellitus 02/09/2017   UTI (urinary tract infection)    Vertigo 03/29/2014   Vitamin D deficiency 09/15/2020   Past Surgical History:  Procedure Laterality Date   ANKLE FRACTURE SURGERY Right    benign tumor      removed from groin   COLONOSCOPY WITH PROPOFOL N/A 05/15/2022   per Dr. Lavon Paganini, adenomatous polyps, repeat in 5 yrs   EYE SURGERY     HOT HEMOSTASIS N/A 05/15/2022   Procedure: HOT HEMOSTASIS (ARGON PLASMA COAGULATION/BICAP);  Surgeon: Napoleon Form, MD;   Location: [facility];  Service: Gastroenterology;  Laterality: N/A;   POLYPECTOMY  05/15/2022   Procedure: POLYPECTOMY;  Surgeon: Napoleon Form, MD;  Location: [facility];  Service: Gastroenterology;;   WRIST ARTHROCENTESIS N/A 2001   Patient Active Problem List   Diagnosis Date Noted   Morbid obesity (HCC) 06/16/2022   Physical deconditioning 06/16/2022   AVM (arteriovenous malformation) of colon, acquired with hemorrhage    Positive colorectal cancer screening using Cologuard test    Polyp of sigmoid colon    Polyp of transverse colon    Polyp of cecum    COVID-19 virus infection 11/21/2021   Vitamin D deficiency 09/15/2020   Major depressive disorder 07/17/2020   Chronic urticaria 12/21/2019   Osteopenia of multiple sites 06/01/2019   Psychophysiological insomnia 03/29/2019   Centrilobular emphysema 02/04/2019   Chronic respiratory failure with hypoxia 02/04/2019   Chronic diastolic CHF (congestive heart failure) 01/28/2019   Essential hypertension 01/26/2019   Postinflammatory pulmonary fibrosis 09/15/2018   COPD (chronic obstructive pulmonary disease) 09/01/2018   DOE (dyspnea on exertion) 08/31/2018   Bilateral leg edema 03/31/2018   Generalized anxiety disorder with panic attacks 01/20/2018   Type 2 diabetes mellitus 02/09/2017   Back pain 10/18/2015   OSA (obstructive sleep apnea) 07/26/2015   Vertigo 03/29/2014   Dyshidrotic eczema 12/13/2010   Acquired hypothyroidism 10/01/2010   Mixed hyperlipidemia 07/20/2009   Allergic rhinitis 07/20/2009   Irritable bowel syndrome 07/13/2009   GERD (gastroesophageal reflux disease) 11/03/2007   Headache 11/03/2007    PCP: Gershon Crane MD   REFERRING PROVIDER: Gershon Crane MD   REFERRING DIAG:  Diagnosis  J96.11 (ICD-10-CM) - Chronic respiratory failure with hypoxia (HCC)    THERAPY DIAG:  Physical deconditioning  Other abnormalities of gait and mobility  Rationale for Evaluation and Treatment:  Rehabilitation  ONSET DATE: has been on O2 for 3 years   SUBJECTIVE:   SUBJECTIVE STATEMENT: The patient continues to go to the [facility] Has been doing well. Still limited by SOB with ambulation.    PERTINENT HISTORY: Knees, Loweback, and wrist arthritis. CHF taking lasix, post inflamitory pulmonary fibrosis; vertigo ( still has syncope at times)  PAIN:  Are you having pain? Yes: NPRS scale: 2/10 Pain location:Right knee worse than left  Pain description: Aching Aggravating factors: Standing walking Relieving factors: Sitting  PRECAUTIONS: falls   WEIGHT BEARING RESTRICTIONS: No  FALLS:  Has patient fallen in last 6 months? No but has ad some near falls   LIVING ENVIRONMENT: Ramp into the house   OCCUPATION: retired  Recreation: would like to be able to get back to gardening  PLOF: Independent with household mobility with device  PATIENT GOALS: To increase endurance  NEXT MD VISIT: Nothing scheduled  OBJECTIVE:   DIAGNOSTIC FINDINGS: Nothing of the knee   COGNITION: Overall cognitive status: Within functional limits for tasks assessed     SENSATION: Has had tinglining in her hands. Has been using wrist guards   MUSCLE LENGTH:  POSTURE: rounded shoulders, forward head, and flexed trunk    LOWER EXTREMITY ROM:  Passive ROM Right eval Left eval  Hip flexion    Hip extension    Hip abduction    Hip adduction    Hip internal rotation    Hip external rotation    Knee flexion  Painful at end range  Knee extension    Ankle dorsiflexion    Ankle plantarflexion    Ankle inversion    Ankle eversion     (Blank rows = not tested)  LOWER EXTREMITY MMT:  MMT Right eval Left eval Right 4/8 Left 4//8 right Left 5/20 Right  6/17 Left  6/17  Hip flexion 19.7 17.0 33.4 29 36. 35.9 52.6 46.5  Hip extension          Hip abduction 36.9 40.3 35 40.9 47.8 44.9 45.4 42.2  Hip adduction          Hip internal rotation          Hip external rotation           Knee flexion          Knee extension 20 18.7 45 35 44.7 56.5 47.7 46.1  Ankle dorsiflexion          Ankle plantarflexion          Ankle inversion          Ankle eversion           (Blank rows = not tested)  LOWER EXTREMITY SPECIAL TESTS:   IE: 5x sit to stand: 24 sec   Hr 85  Sao2 96   01/26/23: walk test: 290ft total- seated rest break at 157ft and paused timer. 4/15: 5xSTS-21sec  6/17: 3 min walk test: 366ft without rest break 5xSTS: 13.12seconds    GAIT:  TODAY'S TREATMENT:                                                                                                                               DATE:    6/17  walk test- 336 feet without rest break-using rollator 5xSTS Muscle testing Goal review Review of exercise program  Nu-step L4 7  min Sao2  86%    PATIENT EDUCATION:  Education details: HEP, symptom management, activity progression Person educated: Patient Education method: Explanation, Demonstration, Tactile cues, and Verbal cues Education comprehension: verbalized understanding, returned demonstration, verbal cues required, tactile cues required, and needs further education  HOME EXERCISE PROGRAM: Access Code: [email or web address] URL: [email or web address] Date: 01/19/2023 Prepared by: Riki Altes  Exercises - Heel Raises with Counter Support  - 1-2 x daily - 7 x weekly - 2 sets - 10 reps - Standing March with  Counter Support  - 1-2 x daily - 7 x weekly - 2 sets - 10 reps - Standing Hip Abduction with Counter Support  - 1-2 x daily - 7 x weekly - 2 sets - 10 reps - Standing Hip Extension with Counter Support  - 1-2 x daily - 7 x weekly - 2 sets - 10 reps - Mini Squat with Counter Support  - 1-2 x daily - 7 x weekly - 2 sets - 10 reps - Standing Knee Flexion with Counter Support  - 1-2 x daily - 7 x weekly - 2 sets - 10 reps  ASSESSMENT:  CLINICAL IMPRESSION: Pt has attended 27 visits of PT thus far. She has met most goals  and reached MMI with PT. She has met all STG and 1/3 LTG. Pt had significant improvement in 5x STS test and 3 minute walk test. She is unable to ambulate for 6 minutes continuously, but does demonstrate improvement in endurance since IE.  Pt is now well established at [facility] with gym program. She demonstrates good understanding of self progression with strengthening and avoids pushing past limitations. Pt remains limited with O2 saturation during walking. She has echocardiogram scheduled in near future.   OBJECTIVE IMPAIRMENTS: Abnormal gait, cardiopulmonary status limiting activity, decreased activity tolerance, decreased balance, decreased endurance, decreased mobility, difficulty walking, decreased ROM, decreased strength, and pain.   ACTIVITY LIMITATIONS: carrying, lifting, bending, standing, squatting, stairs, transfers, bathing, dressing, and locomotion level  PARTICIPATION LIMITATIONS: meal prep, cleaning, laundry, driving, shopping, community activity, and yard work  PERSONAL FACTORS: 3+ comorbidities: Knees, Loweback, and wrist arthritis. CHF taking lasix, post inflamitory pulmonary fibrosis; vertigo ( still has syncope at times) are also affecting patient's functional outcome.   REHAB POTENTIAL: Good  CLINICAL DECISION MAKING: Evolving/moderate complexity declining overall mobility   EVALUATION COMPLEXITY: Moderate   GOALS: Goals reviewed with patient? Yes  SHORT TERM GOALS: Target date: 02/13/2023   Therapy will perform 6-minute walk test with patient and come up with baseline goals Baseline: Goal status: MET 01/26/23  2.  Patient will decrease sit to stand time time less than 12 seconds  baseline:  Goal status: Goal achieved and revised 5/20  3.  Patient will perform 10 minutes straight exercise on NuStep maintaining a heart rate less than 110 Baseline:  Goal status: Met 4/8 revised on   LONG TERM GOALS: Target date: 03/13/2023    Patient will have a complete exercise  program that she can take to [employer] and continue long-term Baseline:  Goal status: MET 6/17  2.  Patient will ambulate 600 feet without self-report of dyspnea in order to attend appointments without being short of breath Baseline:  Goal status: 300 feet progressing 3.  Patient will stand for greater than 20 minutes without increased pain in left knee and increased dyspnea in order to perform ADLs Baseline:  Goal status: In progress 6/13 (Pain in back with standing >2-3 minutes)    PLAN:  PT FREQUENCY: 2x/week  PT DURATION: 8 weeks  PLANNED INTERVENTIONS: Therapeutic exercises, Therapeutic activity, Neuromuscular re-education, Balance training, Gait training, Patient/Family education, Self Care, Joint mobilization, Stair training, DME instructions, Aquatic Therapy, Dry Needling, Electrical stimulation, Spinal mobilization, Cryotherapy, Moist heat, Ultrasound, and Manual therapy  PLAN FOR NEXT SESSION: DC to gym program  [employer], PTA    05/18/2023, 4:20 PM

## 2023-05-22 ENCOUNTER — Encounter (HOSPITAL_BASED_OUTPATIENT_CLINIC_OR_DEPARTMENT_OTHER): Payer: PPO | Admitting: Physical Therapy

## 2023-05-25 ENCOUNTER — Encounter (HOSPITAL_BASED_OUTPATIENT_CLINIC_OR_DEPARTMENT_OTHER): Payer: PPO | Admitting: Physical Therapy

## 2023-05-25 DIAGNOSIS — J432 Centrilobular emphysema: Secondary | ICD-10-CM | POA: Diagnosis not present

## 2023-05-25 DIAGNOSIS — J9611 Chronic respiratory failure with hypoxia: Secondary | ICD-10-CM | POA: Diagnosis not present

## 2023-05-25 DIAGNOSIS — J449 Chronic obstructive pulmonary disease, unspecified: Secondary | ICD-10-CM | POA: Diagnosis not present

## 2023-05-26 DIAGNOSIS — Z1231 Encounter for screening mammogram for malignant neoplasm of breast: Secondary | ICD-10-CM | POA: Diagnosis not present

## 2023-05-26 LAB — HM MAMMOGRAPHY

## 2023-05-27 ENCOUNTER — Encounter: Payer: Self-pay | Admitting: Family Medicine

## 2023-05-27 DIAGNOSIS — I5189 Other ill-defined heart diseases: Secondary | ICD-10-CM | POA: Diagnosis not present

## 2023-05-27 DIAGNOSIS — I517 Cardiomegaly: Secondary | ICD-10-CM | POA: Diagnosis not present

## 2023-05-28 ENCOUNTER — Encounter (HOSPITAL_BASED_OUTPATIENT_CLINIC_OR_DEPARTMENT_OTHER): Payer: PPO | Admitting: Physical Therapy

## 2023-05-31 ENCOUNTER — Other Ambulatory Visit: Payer: Self-pay | Admitting: Family Medicine

## 2023-06-02 DIAGNOSIS — Z794 Long term (current) use of insulin: Secondary | ICD-10-CM | POA: Diagnosis not present

## 2023-06-02 DIAGNOSIS — E1165 Type 2 diabetes mellitus with hyperglycemia: Secondary | ICD-10-CM | POA: Diagnosis not present

## 2023-06-03 NOTE — Progress Notes (Signed)
Cardiology Clinic Note   Patient Name: Bonnie Gonzalez Date of Encounter: 06/05/2023  Primary Care Provider:  Nelwyn Salisbury, MD Primary Cardiologist:  Armanda Magic, MD  Patient Profile    Bonnie Gonzalez 67 year old female presents to the clinic today for evaluation of her shortness of breath and follow-up evaluation of her chronic diastolic CHF.  Past Medical History    Past Medical History:  Diagnosis Date   Acquired hypothyroidism 10/01/2010   Allergic rhinitis 07/20/2009   Arthritis    Back pain 10/18/2015   Bilateral leg edema 03/31/2018   Centrilobular emphysema 02/04/2019   HRCT 09/15/18   Chronic diastolic CHF (congestive heart failure) 01/28/2019   Chronic respiratory failure with hypoxia 02/04/2019   6 Min Walk 08/31/18 at [facility] Pulmon[facility]   Chronic urticaria 12/21/2019   COPD (chronic obstructive pulmonary disease) 09/01/2018   Quit smoking 2010  - Spirometry 08/31/2018  FEV1 1.1 (51%)  Ratio 66 s prior rx with typical curvature  - 08/31/2018  After extensive coaching inhaler device,  effectiveness =    90% with elipta > try anoro sample > no better so d/c'  - PFT's  11/10/2018  FEV1 1.24 (59 % ) ratio 75 (ratio with fev1/VC =  63)  p no % improvement from saba p nothing prior to study with DLCO  105  % corrects to 132  %    DOE (dyspnea on exertion) 08/31/2018   Onset 05/2018 with background of unexplained leg swelling x 01/2018   -echo 04/13/18 just G1   diastolic dysfunction s PH   -  08/31/2018   Walked RA x one lap @ 185 stopped due to  Sob/ sats 89% at nl pace  - Spirometry 08/31/2018  FEV1 1.1 (51%)  Ratio 66 s prior rx  - 09/28/2018 could not do full pfts 09/28/2018  - Collagen vasc profile 09/28/18  For ESR =  54 (was 102 before prednisone)  Neg collag   Dyshidrotic eczema 12/13/2010   Essential hypertension 01/26/2019   Generalized anxiety disorder with panic attacks 01/20/2018   GERD (gastroesophageal reflux disease) 11/03/2007   Headache 11/03/2007    Irritable bowel syndrome 07/13/2009   Major depressive disorder 07/17/2020   Mixed hyperlipidemia 07/20/2009   Obesity    OSA (obstructive sleep apnea) 07/26/2015   HST 08/2015 mild OSA, AHI 8/hour, lowest desaturation 81%, desaturations noted without respiratory events  Formatting of this note might be different from the original. PSG 05/20/19 AHI 25.1   Osteopenia of multiple sites 06/01/2019   DEXA 05/24/2019: Right femur neck T- 2.2, left femur neck T- 1.5, right total femur T -0.5, left total femur T -0.5, AP total spine T- 1.9. FRAX 10-year probability of major osteoporotic fracture is 8.8% and a hip fracture is 1.2%  DEXA 05/05/2019: Right femur neck T- 1.9, left femur neck T- 2.0, right total femur T -1.3, left total femur   Oxygen deficiency    Postinflammatory pulmonary fibrosis 09/15/2018   ESR    10/161/9  = 102   > rx short term prednisone ? slt improvement in symptoms HRCT 09/15/2018  1. Very mild basilar subpleural ground-glass, reticulation and traction bronchiolectasis, findings which may be minimally progressive from 06/29/2017 and are indicative of interstitial lung disease such as nonspecific interstitial pneumonitis. Findings are indeterminate for UIP per consensus guidelin   Psychophysiological insomnia 03/29/2019   Type 2 diabetes mellitus 02/09/2017   UTI (urinary tract infection)    Vertigo 03/29/2014   Vitamin D  deficiency 09/15/2020   Past Surgical History:  Procedure Laterality Date   ANKLE FRACTURE SURGERY Right    benign tumor      removed from groin   COLONOSCOPY WITH PROPOFOL N/A 05/15/2022   per Dr. Lavon Paganini, adenomatous polyps, repeat in 5 yrs   EYE SURGERY     HOT HEMOSTASIS N/A 05/15/2022   Procedure: HOT HEMOSTASIS (ARGON PLASMA COAGULATION/BICAP);  Surgeon: Napoleon Form, MD;  Location: [facility];  Service: Gastroenterology;  Laterality: N/A;   POLYPECTOMY  05/15/2022   Procedure: POLYPECTOMY;  Surgeon: Napoleon Form, MD;  Location: [facility]  ENDOSCOPY;  Service: Gastroenterology;;   WRIST ARTHROCENTESIS N/A 2001    Allergies  Allergies  Allergen Reactions   Aspirin     Mother had to be resuscitated after ingesting this   Hydrocodone-Acetaminophen Hives   Oxycodone-Acetaminophen Hives   Penicillins Hives    Did it involve swelling of the face/tongue/throat, SOB, or low BP? Yes-hives Did it involve sudden or severe rash/hives, skin peeling, or any reaction on the inside of your mouth or nose? Unk Did you need to seek medical attention at a hospital or doctor's office? Yes When did it last happen? Was in her 30's If all above answers are "NO", may proceed with cephalosporin use.  Did it involve swelling of the face/tongue/throat, SOB, or low BP? Yes-hives Did it involve sudden or severe rash/hives, skin peeling, or any reaction on the inside of your mouth or nose? Unk Did you need to seek medical attention at a hospital or doctor's office? Yes When did it last happen? Was in her 30's If all above answers are "NO", may proceed with cephalosporin use.   Codeine Hives   Fluoxetine Hcl Itching and Other (See Comments)    "Hair itching"   Trazodone Other (See Comments)    Couldn't walk    History of Present Illness    Bonnie Gonzalez has a PMH of chronic diastolic CHF, HTN, AVM, allergic rhinitis, OSA, COPD, chronic respiratory failure with hypoxia, GERD, eczema, hyperlipidemia, back pain, DOE, COVID-19 infection, physical deconditioning, and morbid obesity.  She was seen and evaluated at the request of her PCP 09/03/2021.  She was seen by Dr. Mayford Knife.  Her echocardiogram 2020 showed normal LV function with an EF of 60 to 65% and G1 DD with no valvular abnormalities.  She reported sharp chest wall pain that was sporadic with radiation to her neck which she described as an ache.  She also noted some diaphoresis and nausea that she could not relate to her chest discomfort.  She used a CPAP at night but denied PND and orthopnea.   She did note some dizziness at times.  She denied palpitations and syncope.  She reported medication compliance and was tolerating her medications well without side effects.  Her repeat echocardiogram 09/23/2021 showed an LVEF of 60-65%, G1 DD, and mild aortic valve sclerosis with no evidence of stenosis.  She had a coronary CTA which showed a coronary calcium score of 10.  She was noted to have 1-24% stenosis in her LAD.  No significant plaque in her circumflex.  No significant plaque in her RCA.  And her aortic was noted to be 36 mm in the mid ascending section.  Her furosemide 40 mg daily was continued.  Her blood pressure was well-controlled.  Her EKG showed prior inferior and septal infarct which was felt to be related to her COPD and history of tobacco use with worsening shortness of breath.  She was connected with virtually on 12/10/2021 by Dr. Mayford Knife.  She was euvolemic.  Her furosemide was continued.  Her blood pressure was well-controlled.  She remained stable from a cardiac standpoint.  LDL 82 on 04/21/2023  She presents to the clinic today for follow-up evaluation and states she has been noticing increased dyspnea.  [facility].  It showed elevated estimated wedge pressures.  She was not noted to be fluid volume overloaded.  They recommended follow-up with cardiology.  Echocardiogram was reviewed with DOD and elevated pressures appear to be related to patient's COPD.  On exam today she is not fluid volume overloaded.  Her weight has improved from 269 pounds to 266 pounds she has been going to the gym 5 days/week and does both upper and lower body exercises.  She does report some back pain in her groin that would last for around 45 seconds and dissipate without intervention.  It appears to be musculoskeletal or nerve related.  She presents today using 3 L nasal cannula and saturating at 94%.  Her blood pressure initially is 144/94 and on  recheck is 136/70.  I recommended that she follow-up with pulmonology for further recommendations.  Today she denies chest pain, shortness of breath, lower extremity edema, fatigue, palpitations, melena, hematuria, hemoptysis, diaphoresis, weakness, presyncope, and syncope.  Home Medications    Prior to Admission medications   Medication Sig Start Date End Date Taking? Authorizing Provider  esomeprazole (NEXIUM) 40 MG capsule Take 1 capsule (40 mg total) by mouth 2 (two) times daily before a meal. 05/13/23   Nelwyn Salisbury, MD  albuterol (PROVENTIL HFA;VENTOLIN HFA) 108 (90 Base) MCG/ACT inhaler Inhale 2 puffs into the lungs every 4 (four) hours as needed for wheezing or shortness of breath. 06/17/18   Nelwyn Salisbury, MD  ALPRAZolam Prudy Feeler) 0.5 MG tablet TAKE 1 TABLET (0.5 MG TOTAL) BY MOUTH 2 (TWO) TIMES DAILY AS NEEDED. Patient taking differently: Take 0.25-0.5 mg by mouth 2 (two) times daily as needed for anxiety or sleep. 07/21/18   Nelwyn Salisbury, MD  atorvastatin (LIPITOR) 80 MG tablet Take 1 tablet (80 mg total) by mouth daily. 04/21/23   Nelwyn Salisbury, MD  azelastine (ASTELIN) 0.1 % nasal spray Place 1 spray into both nostrils 2 (two) times daily as needed for allergies. 07/25/22   Nelwyn Salisbury, MD  benzonatate (TESSALON) 200 MG capsule TAKE 1 CAPSULE BY MOUTH EVERY 8 HOURS AS NEEDED FOR COUGH 12/03/22   Nelwyn Salisbury, MD  buPROPion ER [facility] SR) 100 MG 12 hr tablet Take 100 mg by mouth daily. 01/06/23   [provider]  cetirizine (ZYRTEC) 10 MG tablet Take 10 mg by mouth daily.    [provider]  Cholecalciferol 125 MCG (5000 UT) TABS Take 5,000 Units by mouth in the morning. 12/23/19   [provider]  Continuous Glucose Sensor (FREESTYLE LIBRE 2 SENSOR) MISC Inject 1 kit into the skin every 14 (fourteen) days. 10/28/22   [provider]  Dextromethorphan-guaiFENesin [facility] COUGH & CHEST CONGEST PO) Take 1 capsule by mouth as needed (Patient taking  as needed).    [provider]  DULoxetine (CYMBALTA) 60 MG capsule Take 1 capsule (60 mg total) by mouth daily. Patient taking differently: Take 120 mg by mouth at bedtime. Take two capsules daily 01/19/18   Nelwyn Salisbury, MD  esomeprazole (NEXIUM) 20 MG capsule TAKE 1 CAPSULE BY MOUTH ONCE DAILY  BEFORE BREAKFAST 04/21/23   Nelwyn Salisbury, MD  Fluticasone-Umeclidin-Vilant (TRELEGY ELLIPTA) 100-62.5-25 MCG/INH AEPB Inhale 1 puff into the lungs daily.    [provider]  furosemide (LASIX) 20 MG tablet Take 1 tablet (20 mg total) by mouth daily. 04/21/23   Nelwyn Salisbury, MD  glucose 4 GM chewable tablet Chew 1 tablet (4 g total) by mouth as needed for low blood sugar (< 70). 01/16/19   Noralee Stain, DO  hydrOXYzine (VISTARIL) 25 MG capsule Take 1 capsule by mouth three times daily as needed 05/06/23   Nelwyn Salisbury, MD  Hyprom-Naphaz-Polysorb-Zn Sulf (CLEAR EYES COMPLETE) SOLN Place 1-2 drops into both eyes as needed (for itching).    [provider]  insulin aspart (NOVOLOG) 100 UNIT/ML injection Use sliding scale provided at time of discharge, administer 3 times daily with meals Patient taking differently: Inject 15-48 Units into the skin See admin instructions. Inject 20-40 units subcutaneously 3 times daily with meals and inject 15-35 units subcutaneously at night if needed with nighttime snacking 01/16/19 05/12/24  Noralee Stain, DO  insulin glargine, 2 Unit Dial, (TOUJEO MAX SOLOSTAR) 300 UNIT/ML Solostar Pen Inject 80 Units into the skin 2 (two) times daily.    [provider]  Insulin Pen Needle (B-D UF III MINI PEN NEEDLES) 31G X 5 MM MISC Use to administer insulin five times a day 11/06/20   [provider]  levothyroxine (SYNTHROID) 175 MCG tablet TAKE 1 TABLET BY MOUTH ONCE DAILY BEFORE BREAKFAST. *Labs required for future refills* 04/21/23   Nelwyn Salisbury, MD  linaclotide [facility]) 290 MCG CAPS capsule Take 1 capsule (290 mcg total) by mouth daily  before breakfast. 01/23/23   Nandigam, Eleonore Chiquito, MD  methocarbamol (ROBAXIN) 500 MG tablet Take 1 tablet (500 mg total) by mouth every 6 (six) hours as needed for muscle spasms. 05/24/21   Nelwyn Salisbury, MD  Misc Natural Products (COMPLETE MENOPAUSE HEALTH PO) Take by mouth. [place] complete menopause gummies    [provider]  MOUNJARO 12.5 MG/0.5ML Pen Inject 12.5 mg into the skin once a week. 10/28/22   [provider]  naproxen (NAPROSYN) 500 MG tablet Take 1 tablet by mouth twice daily 12/29/22   Nelwyn Salisbury, MD  Nerve Stimulator (CLEVER CHOICE TENS UNIT) DEVI Place 1 Dose onto the skin as needed (pain.). Tens 7000    [provider]  nystatin (MYCOSTATIN/NYSTOP) powder APPLY 1 APPLICATION TOPICALLY 2 TIMES DAILY AS NEEDED FOR RASH 05/11/23   Nelwyn Salisbury, MD  OXYGEN Inhale 3 L into the lungs continuous.    [provider]  primidone (MYSOLINE) 50 MG tablet Take 1 tablet (50 mg total) by mouth at bedtime. 03/19/23   Tat, Octaviano Batty, DO  Probiotic Product (MISC INTESTINAL FLORA REGULAT) CAPS Take 1 capsule by mouth daily.    [provider]  Rhubarb (ESTROVEN COMPLETE PO) Take by mouth.    [provider]  temazepam (RESTORIL) 15 MG capsule Take 15 mg by mouth at bedtime. 10/07/21   [provider]  terbinafine (LAMISIL) 250 MG tablet Take 1 tablet by mouth once daily 06/02/23   Nelwyn Salisbury, MD    Family History    Family History  Problem Relation Age of Onset   Arthritis/Rheumatoid Mother    Alzheimer's disease Father    Diabetes Mellitus II Father    Dementia Father    Diabetes Mellitus II Sister    Dementia Brother    Diabetes  Mellitus II Brother    Asthma Other        fhx   Depression Other        fhx   Diabetes Other        fhx   Parkinsonism Other        fhx   Lupus Other        fhx   She indicated that her mother is deceased. She indicated that her father is deceased. She indicated that only one of  her two sisters is alive. She indicated that her brother is alive. She indicated that her child is alive.  Social History    Social History   Socioeconomic History   Marital status: Divorced    Spouse name: Not on file   Number of children: 4   Years of education: 13   Highest education level: Some college, no degree  Occupational History    Comment: retired  Tobacco Use   Smoking status: Former    Packs/day: 1.00    Years: 35.00    Additional pack years: 0.00    Total pack years: 35.00    Types: Cigarettes    Quit date: 12/01/2008    Years since quitting: 14.5   Smokeless tobacco: Never  Vaping Use   Vaping Use: Never used  Substance and Sexual Activity   Alcohol use: Not Currently   Drug use: No   Sexual activity: Not Currently  Other Topics Concern   Not on file  Social History Narrative   Lives alone.   She has four grown children.   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   Highest level of education:  1.5 years of college      Right Handed   Is on 3 L of oxygen    Social Determinants of Health   Financial Resource Strain: Low Risk  (04/15/2022)   Overall Financial Resource Strain (CARDIA)    Difficulty of Paying Living Expenses: Not hard at all  Food Insecurity: No Food Insecurity (04/15/2022)   Hunger Vital Sign    Worried About Running Out of Food in the Last Year: Never true    Ran Out of Food in the Last Year: Never true  Transportation Needs: No Transportation Needs (04/15/2022)   PRAPARE - Administrator, Civil Service (Medical): No    Lack of Transportation (Non-Medical): No  Physical Activity: Inactive (04/16/2023)   Exercise Vital Sign    Days of Exercise per Week: 0 days    Minutes of Exercise per Session: 0 min  Stress: No Stress Concern Present (04/15/2022)   [employer] of Occupational Health - Occupational Stress Questionnaire    Feeling of Stress : Only a little  Social Connections: Socially Isolated  (04/15/2022)   Social Connection and Isolation Panel [NHANES]    Frequency of Communication with Friends and Family: More than three times a week    Frequency of Social Gatherings with Friends and Family: More than three times a week    Attends Religious Services: Never    Database administrator or Organizations: No    Attends Banker Meetings: Never    Marital Status: Divorced  Catering manager Violence: Not At Risk (04/15/2022)   Humiliation, Afraid, Rape, and Kick questionnaire    Fear of Current or Ex-Partner: No    Emotionally Abused: No    Physically Abused: No    Sexually Abused: No     Review of Systems  General:  No chills, fever, night sweats or weight changes.  Cardiovascular:  No chest pain, dyspnea on exertion, edema, orthopnea, palpitations, paroxysmal nocturnal dyspnea. Dermatological: No rash, lesions/masses Respiratory: No cough, dyspnea Urologic: No hematuria, dysuria Abdominal:   No nausea, vomiting, diarrhea, bright red blood per rectum, melena, or hematemesis Neurologic:  No visual changes, wkns, changes in mental status. All other systems reviewed and are otherwise negative except as noted above.  Physical Exam    VS:  BP 136/70   Pulse 81   Ht 5' (1.524 m)   Wt 266 lb (120.7 kg)   SpO2 94%   BMI 51.95 kg/m  , BMI Body mass index is 51.95 kg/m. GEN: Well nourished, well developed, in no acute distress. HEENT: normal. Neck: Supple, no JVD, carotid bruits, or masses. Cardiac: RRR, no murmurs, rubs, or gallops. No clubbing, cyanosis, edema.  Radials/DP/PT 2+ and equal bilaterally.  Respiratory:  Respirations regular and unlabored, clear to auscultation bilaterally. GI: Soft, nontender, nondistended, BS + x 4. MS: no deformity or atrophy. Skin: warm and dry, no rash. Neuro:  Strength and sensation are intact. Psych: Normal affect.  Accessory Clinical Findings    Recent Labs: 04/21/2023: ALT 27; BUN 15; Creatinine, Ser 0.62; Hemoglobin  12.4; Platelets 209.0; Potassium 4.7; Sodium 132; TSH 1.81   Recent Lipid Panel    Component Value Date/Time   CHOL 147 04/21/2023 1145   CHOL 167 11/13/2021 0914   TRIG 115.0 04/21/2023 1145   HDL 42.70 04/21/2023 1145   HDL 38 (L) 11/13/2021 0914   CHOLHDL 3 04/21/2023 1145   VLDL 23.0 04/21/2023 1145   LDLCALC 82 04/21/2023 1145   LDLCALC 110 (H) 11/13/2021 0914   LDLDIRECT 176.0 04/10/2021 0716         ECG personally reviewed by me today-none today.  Echocardiogram 09/23/2021  IMPRESSIONS     1. Left ventricular ejection fraction, by estimation, is 60 to 65%. The  left ventricle has normal function. The left ventricle has no regional  wall motion abnormalities. Left ventricular diastolic parameters are  consistent with Grade I diastolic  dysfunction (impaired relaxation).   2. Right ventricular systolic function is normal. The right ventricular  size is normal. There is normal pulmonary artery systolic pressure. The  estimated right ventricular systolic pressure is 33.5 mmHg.   3. The mitral valve is grossly normal. Trivial mitral valve  regurgitation. No evidence of mitral stenosis.   4. The aortic valve is tricuspid. There is mild calcification of the  aortic valve. There is mild thickening of the aortic valve. Aortic valve  regurgitation is not visualized. Mild aortic valve sclerosis is present,  with no evidence of aortic valve  stenosis.   5. The inferior vena cava is normal in size with greater than 50%  respiratory variability, suggesting right atrial pressure of 3 mmHg.   Comparison(s): No significant change from prior study.   FINDINGS   Left Ventricle: Left ventricular ejection fraction, by estimation, is 60  to 65%. The left ventricle has normal function. The left ventricle has no  regional wall motion abnormalities. The left ventricular internal cavity  size was normal in size. There is   no left ventricular hypertrophy. Left ventricular diastolic  parameters  are consistent with Grade I diastolic dysfunction (impaired relaxation).   Right Ventricle: The right ventricular size is normal. No increase in  right ventricular wall thickness. Right ventricular systolic function is  normal. There is normal pulmonary artery systolic pressure. The tricuspid  regurgitant  velocity is 2.76 m/s, and   with an assumed right atrial pressure of 3 mmHg, the estimated right  ventricular systolic pressure is 33.5 mmHg.   Left [facility]: Left atrial size was normal in size.   Right [facility]: Right atrial size was normal in size.   Pericardium: Trivial pericardial effusion is present.   Mitral Valve: The mitral valve is grossly normal. Mild mitral annular  calcification. Trivial mitral valve regurgitation. No evidence of mitral  valve stenosis.   Tricuspid Valve: The tricuspid valve is grossly normal. Tricuspid valve  regurgitation is trivial. No evidence of tricuspid stenosis.   Aortic Valve: The aortic valve is tricuspid. There is mild calcification  of the aortic valve. There is mild thickening of the aortic valve. Aortic  valve regurgitation is not visualized. Mild aortic valve sclerosis is  present, with no evidence of aortic  valve stenosis.   Pulmonic Valve: The pulmonic valve was grossly normal. Pulmonic valve  regurgitation is not visualized. No evidence of pulmonic stenosis.   Aorta: The aortic root and ascending aorta are structurally normal, with  no evidence of dilitation.   Venous: The inferior vena cava is normal in size with greater than 50%  respiratory variability, suggesting right atrial pressure of 3 mmHg.   IAS/Shunts: The atrial septum is grossly normal.    Coronary CTA 09/25/2021 FINDINGS: Coronary calcium score: The patient's coronary artery calcium score is 10, which places the patient in the 60th percentile.   Coronary arteries: Normal coronary origins.  Left dominance.   Right Coronary Artery: Normal caliber  vessel. No significant plaque or stenosis.   Left Main Coronary Artery: Normal caliber vessel. No significant plaque or stenosis.   Left Anterior Descending Coronary Artery: Normal caliber vessel. Trivial mixed calcified and noncalcified plaque with 1-24% stenosis. There is a small myocardial bridge of 11 mm in the mid LAD. Gives rise to 2 diagonal branches.   Left Circumflex Artery: Normal caliber vessel, gives rise to PDA. No significant plaque or stenosis. Gives rise to 2 OM branches.   Aorta: Normal size, 36 mm at the mid ascending aorta (level of the PA bifurcation) measured double oblique. Scattered calcifications consistent with aortic atherosclerosis. No dissection seen in visualized portions of the aorta.   Aortic Valve: No calcifications. Trileaflet.   Other findings:   Normal pulmonary vein drainage into the left [facility].   Normal left atrial appendage without a thrombus.   Normal size of the pulmonary artery.   Normal appearance of the pericardium.   IMPRESSION: 1.  Minimal nonobstructive CAD, CADRADS = 1.   2.  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   3. Coronary calcium score of 10. This was 60th percentile for age and sex matched control.   4. Normal coronary origin with left dominance.   5.  Aortic atherosclerosis.   INTERPRETATION:   1. CAD-RADS 0: No evidence of CAD (0%). Consider non-atherosclerotic causes of chest pain.   2. CAD-RADS 1: Minimal non-obstructive CAD (0-24%). Consider non-atherosclerotic causes of chest pain. Consider preventive therapy and risk factor modification.   3. CAD-RADS 2: Mild non-obstructive CAD (25-49%). Consider non-atherosclerotic causes of chest pain. Consider preventive therapy and risk factor modification.   4. CAD-RADS 3: Moderate stenosis (50-69%). Consider symptom-guided anti-ischemic pharmacotherapy as well as risk factor modification per guideline directed care. Additional analysis with CT FFR will  be submitted.   5. CAD-RADS 4: Severe stenosis. (70-99% or > 50% left main). Cardiac catheterization or CT FFR is  recommended. Consider symptom-guided anti-ischemic pharmacotherapy as well as risk factor modification per guideline directed care. Invasive coronary angiography recommended with revascularization per published guideline statements.   6. CAD-RADS 5: Total coronary occlusion (100%). Consider cardiac catheterization or viability assessment. Consider symptom-guided anti-ischemic pharmacotherapy as well as risk factor modification per guideline directed care.   7. CAD-RADS N: Non-diagnostic study. Obstructive CAD can't be excluded. Alternative evaluation is recommended.     Electronically Signed   By: Jodelle Red M.D.   On: 09/25/2021 20:52             Assessment & Plan   1.  Chronic diastolic CHF, DOE-increased work of breathing with increased physical activity.  Echocardiogram 09/23/2021 showed an LVEF of 60 to 65%, G1 DD, and no significant valvular abnormalities.  Increased work of breathing appears to be related to body habitus and physical versus COPD.  TTE 05/27/2023 at [facility] showed greater than 15 PCWP.  LV function normal and atria appear normal.  Echocardiogram reviewed with DOD. Continue furosemide Heart healthy low-sodium diet Follow-up with pulmonology  Essential hypertension-BP today 136/70 . Maintain blood pressure log Continue current medical therapy Heart healthy low-sodium diet  Hyperlipidemia-LDL 82 on 04/21/2023 High-fiber diet Continue atorvastatin Follows with PCP  COPD-history of tobacco use. Continue current medical therapy Follows with PCP  Disposition: Follow-up with Dr. Mayford Knife  Increase physical activity as tolerated   Thomasene Ripple. Eavan Gonterman NP-C     06/05/2023, 4:29 PM [facility] [address] Office [phone] Fax [phone]    I spent 15 minutes examining this  patient, reviewing medications, and using patient centered shared decision making involving her cardiac care.  Prior to her visit I spent greater than 20 minutes reviewing her past medical history,  medications, and prior cardiac tests.

## 2023-06-05 ENCOUNTER — Telehealth: Payer: Self-pay

## 2023-06-05 ENCOUNTER — Encounter: Payer: Self-pay | Admitting: General Practice

## 2023-06-05 ENCOUNTER — Ambulatory Visit: Payer: PPO | Attending: General Practice | Admitting: General Practice

## 2023-06-05 VITALS — BP 136/70 | HR 81 | Ht 60.0 in | Wt 266.0 lb

## 2023-06-05 DIAGNOSIS — J449 Chronic obstructive pulmonary disease, unspecified: Secondary | ICD-10-CM | POA: Diagnosis not present

## 2023-06-05 DIAGNOSIS — I5032 Chronic diastolic (congestive) heart failure: Secondary | ICD-10-CM | POA: Diagnosis not present

## 2023-06-05 DIAGNOSIS — R0609 Other forms of dyspnea: Secondary | ICD-10-CM | POA: Diagnosis not present

## 2023-06-05 DIAGNOSIS — E782 Mixed hyperlipidemia: Secondary | ICD-10-CM

## 2023-06-05 DIAGNOSIS — I10 Essential (primary) hypertension: Secondary | ICD-10-CM | POA: Diagnosis not present

## 2023-06-05 NOTE — Telephone Encounter (Signed)
*  Primary  PA request received for Terbinafine HCl 250MG  tablets  PA submitted to [employer] and is pending additional questions/determination  *patient previously has paid out of pocket for med  Key: [id number]

## 2023-06-05 NOTE — Patient Instructions (Signed)
Medication Instructions:  The current medical regimen is effective;  continue present plan and medications as directed. Please refer to the Current Medication list given to you today.  *If you need a refill on your cardiac medications before your next appointment, please call your pharmacy*   Lab Work: NONE If you have labs (blood work) drawn today and your tests are completely normal, you will receive your results only by: MyChart Message (if you have MyChart) OR A paper copy in the mail If you have any lab test that is abnormal or we need to change your treatment, we will call you to review the results.   Testing/Procedures: NONE   Follow-Up: At [facility], you and your health needs are our priority.  As part of our continuing mission to provide you with exceptional heart care, we have created designated Provider Care Teams.  These Care Teams include your primary Cardiologist (physician) and Advanced Practice Providers (APPs -  Physician Assistants and Nurse Practitioners) who all work together to provide you with the care you need, when you need it.  We recommend signing up for the patient portal called "MyChart".  Sign up information is provided on this After Visit Summary.  MyChart is used to connect with patients for Virtual Visits (Telemedicine).  Patients are able to view lab/test results, encounter notes, upcoming appointments, etc.  Non-urgent messages can be sent to your provider as well.   To learn more about what you can do with MyChart, go to [email or web address].    9-12 month(s)  Provider:   Armanda Magic, MD OR ANY APP-[facility]    Other Instructions YOUR ELEVATED PRESSURES SEEM TO BE RELATED TO YOUR COPD

## 2023-06-10 DIAGNOSIS — J449 Chronic obstructive pulmonary disease, unspecified: Secondary | ICD-10-CM | POA: Diagnosis not present

## 2023-06-10 DIAGNOSIS — I5032 Chronic diastolic (congestive) heart failure: Secondary | ICD-10-CM | POA: Diagnosis not present

## 2023-06-13 NOTE — Telephone Encounter (Signed)
Pharmacy Patient Advocate Encounter  Received notification from [employer] that Prior Authorization for Terbinafine HCl 250MG  tablets has been DENIED because under Medicare Part D utilization management rules are not met. When used for a medically accepted indication (approved medical condition), the Plan covers the requested drug at 84 tablets per 180 days. Per Approved [employer], the duration of treatment is 6 weeks for fingernail onychomycosis; and 12 weeks for toenail onychomycosis. Your request for a quantity limit exception has been denied as the requested drug at a quantity of 1 tablet daily for 3 to 6 months, per the Plan's policy, exceeds the dosage that is recommended by the Food and Drug Administration (FDA), as well as supported by [employer] references. ..  PA #/Case ID/Reference #: [id number] Key: [id number]   Please be advised we currently do not have a Pharmacist to review denials, therefore you will need to process appeals accordingly as needed. Thanks for your support at this time. Contact for appeals are as follows: Phone: [phone], Fax: [phone]

## 2023-06-24 DIAGNOSIS — J449 Chronic obstructive pulmonary disease, unspecified: Secondary | ICD-10-CM | POA: Diagnosis not present

## 2023-06-24 DIAGNOSIS — J9611 Chronic respiratory failure with hypoxia: Secondary | ICD-10-CM | POA: Diagnosis not present

## 2023-06-24 DIAGNOSIS — J432 Centrilobular emphysema: Secondary | ICD-10-CM | POA: Diagnosis not present

## 2023-06-25 ENCOUNTER — Ambulatory Visit (INDEPENDENT_AMBULATORY_CARE_PROVIDER_SITE_OTHER): Payer: PPO | Admitting: Podiatry

## 2023-06-25 ENCOUNTER — Ambulatory Visit: Payer: PPO | Admitting: Podiatry

## 2023-06-25 DIAGNOSIS — Q828 Other specified congenital malformations of skin: Secondary | ICD-10-CM | POA: Diagnosis not present

## 2023-06-25 NOTE — Progress Notes (Signed)
Subjective:  Patient ID: Bonnie Gonzalez, female    DOB: 06/26/1956,  MRN: [id number]  Bonnie Gonzalez presents to clinic today for:  Chief Complaint  Patient presents with   Callouses    Left foot lesion,fore foot,  started 4 months ago,    Patient presents with the above complaint.  States that this spot on the bottom of her left foot has been giving her trouble for a few months.  As she is not sure if it is a corn or wart.  Patient is on oxygen.  She uses a walker to assist with ambulation.  PCP is Nelwyn Salisbury, MD.  Past Medical History:  Diagnosis Date   Acquired hypothyroidism 10/01/2010   Allergic rhinitis 07/20/2009   Arthritis    Back pain 10/18/2015   Bilateral leg edema 03/31/2018   Centrilobular emphysema 02/04/2019   HRCT 09/15/18   Chronic diastolic CHF (congestive heart failure) 01/28/2019   Chronic respiratory failure with hypoxia 02/04/2019   6 Min Walk 08/31/18 at [facility]   Chronic urticaria 12/21/2019   COPD (chronic obstructive pulmonary disease) 09/01/2018   Quit smoking 2010  - Spirometry 08/31/2018  FEV1 1.1 (51%)  Ratio 66 s prior rx with typical curvature  - 08/31/2018  After extensive coaching inhaler device,  effectiveness =    90% with elipta > try anoro sample > no better so d/c'  - PFT's  11/10/2018  FEV1 1.24 (59 % ) ratio 75 (ratio with fev1/VC =  63)  p no % improvement from saba p nothing prior to study with DLCO  105  % corrects to 132  %    DOE (dyspnea on exertion) 08/31/2018   Onset 05/2018 with background of unexplained leg swelling x 01/2018   -echo 04/13/18 just G1   diastolic dysfunction s PH   -  08/31/2018   Walked RA x one lap @ 185 stopped due to  Sob/ sats 89% at nl pace  - Spirometry 08/31/2018  FEV1 1.1 (51%)  Ratio 66 s prior rx  - 09/28/2018 could not do full pfts 09/28/2018  - Collagen vasc profile 09/28/18  For ESR =  54 (was 102 before prednisone)  Neg collag   Dyshidrotic eczema 12/13/2010   Essential hypertension  01/26/2019   Generalized anxiety disorder with panic attacks 01/20/2018   GERD (gastroesophageal reflux disease) 11/03/2007   Headache 11/03/2007   Irritable bowel syndrome 07/13/2009   Major depressive disorder 07/17/2020   Mixed hyperlipidemia 07/20/2009   Obesity    OSA (obstructive sleep apnea) 07/26/2015   HST 08/2015 mild OSA, AHI 8/hour, lowest desaturation 81%, desaturations noted without respiratory events  Formatting of this note might be different from the original. PSG 05/20/19 AHI 25.1   Osteopenia of multiple sites 06/01/2019   DEXA 05/24/2019: Right femur neck T- 2.2, left femur neck T- 1.5, right total femur T -0.5, left total femur T -0.5, AP total spine T- 1.9. FRAX 10-year probability of major osteoporotic fracture is 8.8% and a hip fracture is 1.2%  DEXA 05/05/2019: Right femur neck T- 1.9, left femur neck T- 2.0, right total femur T -1.3, left total femur   Oxygen deficiency    Postinflammatory pulmonary fibrosis 09/15/2018   ESR    10/161/9  = 102   > rx short term prednisone ? slt improvement in symptoms HRCT 09/15/2018  1. Very mild basilar subpleural ground-glass, reticulation and traction bronchiolectasis, findings which may be minimally progressive from 06/29/2017 and are  indicative of interstitial lung disease such as nonspecific interstitial pneumonitis. Findings are indeterminate for UIP per consensus guidelin   Psychophysiological insomnia 03/29/2019   Type 2 diabetes mellitus 02/09/2017   UTI (urinary tract infection)    Vertigo 03/29/2014   Vitamin D deficiency 09/15/2020    Past Surgical History:  Procedure Laterality Date   ANKLE FRACTURE SURGERY Right    benign tumor      removed from groin   COLONOSCOPY WITH PROPOFOL N/A 05/15/2022   per Dr. Lavon Paganini, adenomatous polyps, repeat in 5 yrs   EYE SURGERY     HOT HEMOSTASIS N/A 05/15/2022   Procedure: HOT HEMOSTASIS (ARGON PLASMA COAGULATION/BICAP);  Surgeon: Napoleon Form, MD;  Location: [facility];   Service: Gastroenterology;  Laterality: N/A;   POLYPECTOMY  05/15/2022   Procedure: POLYPECTOMY;  Surgeon: Napoleon Form, MD;  Location: [facility];  Service: Gastroenterology;;   WRIST ARTHROCENTESIS N/A 2001    Allergies  Allergen Reactions   Aspirin     Mother had to be resuscitated after ingesting this   Hydrocodone-Acetaminophen Hives   Oxycodone-Acetaminophen Hives   Penicillins Hives    Did it involve swelling of the face/tongue/throat, SOB, or low BP? Yes-hives Did it involve sudden or severe rash/hives, skin peeling, or any reaction on the inside of your mouth or nose? Unk Did you need to seek medical attention at a hospital or doctor's office? Yes When did it last happen? Was in her 30's If all above answers are "NO", may proceed with cephalosporin use.  Did it involve swelling of the face/tongue/throat, SOB, or low BP? Yes-hives Did it involve sudden or severe rash/hives, skin peeling, or any reaction on the inside of your mouth or nose? Unk Did you need to seek medical attention at a hospital or doctor's office? Yes When did it last happen? Was in her 30's If all above answers are "NO", may proceed with cephalosporin use.   Codeine Hives   Fluoxetine Hcl Itching and Other (See Comments)    "Hair itching"   Trazodone Other (See Comments)    Couldn't walk    ROS   Objective:  There were no vitals filed for this visit.  Bonnie Gonzalez is a pleasant 67 y.o. female in NAD. AAO x 3.  Vascular Examination: Capillary refill time is 3-5 seconds to toes bilateral. Palpable pedal pulses b/l LE. Digital hair present b/l. No pedal edema b/l. Skin temperature gradient WNL b/l. No varicosities b/l. No cyanosis or clubbing noted b/l.   Dermatological Examination: Pedal skin with normal turgor, texture and tone b/l. No open wounds. No interdigital macerations b/l.   Focal hyperkeratotic lesion present, with pain on palpation, located submet 3 left foot.  No surrounding  erythema or signs of drainage are noted.  The gross appearance of the lesion is not consistent with a wart.  Neurological Examination: Epicritic sensation is intact.     Latest Ref Rng & Units 04/21/2023   11:45 AM  Hemoglobin A1C  Hemoglobin-A1c 4.6 - 6.5 % 7.7    Assessment/Plan: 1. Porokeratosis     The hyperkeratotic lesion was sharply debrided/shaved with sterile #312 blade.  Following shaving the lesion.  The Cantharone solution could not be located in the office, therefore Salinocaine ointment was applied to the area followed by Band-Aid.  She will remove this at bedtime.  Informed patient this may come back couple times.  She may also use a pumice stone at home if she would like.  She can  also use Kerasal cream on the hyperkeratotic lesion nightly.  A felt offloading pad was also applied at the end of treatment today.  She noted immediate improvement upon weightbearing  Discussed with the patient what is causing the corns/calluses and reviewed treatment options today, including shaving the the painful lesion(s), off-loading techniques and pads, custom orthotics / shoe modifications.   Patient can follow-up in approximately 6 weeks for recheck of the lesion and possible retreatment with Dr. Alma Downs, DPM, FACFAS [facility]     [address] [place], [place] [address]                Office [phone]  Fax [phone]

## 2023-06-26 ENCOUNTER — Encounter: Payer: Self-pay | Admitting: Podiatry

## 2023-07-01 ENCOUNTER — Encounter (INDEPENDENT_AMBULATORY_CARE_PROVIDER_SITE_OTHER): Payer: Self-pay

## 2023-07-11 DIAGNOSIS — J449 Chronic obstructive pulmonary disease, unspecified: Secondary | ICD-10-CM | POA: Diagnosis not present

## 2023-07-11 DIAGNOSIS — I5032 Chronic diastolic (congestive) heart failure: Secondary | ICD-10-CM | POA: Diagnosis not present

## 2023-07-16 DIAGNOSIS — F5101 Primary insomnia: Secondary | ICD-10-CM | POA: Diagnosis not present

## 2023-07-16 DIAGNOSIS — F411 Generalized anxiety disorder: Secondary | ICD-10-CM | POA: Diagnosis not present

## 2023-07-16 DIAGNOSIS — F330 Major depressive disorder, recurrent, mild: Secondary | ICD-10-CM | POA: Diagnosis not present

## 2023-07-20 ENCOUNTER — Other Ambulatory Visit: Payer: Self-pay

## 2023-07-20 ENCOUNTER — Ambulatory Visit (INDEPENDENT_AMBULATORY_CARE_PROVIDER_SITE_OTHER): Payer: PPO | Admitting: Family Medicine

## 2023-07-20 ENCOUNTER — Encounter: Payer: Self-pay | Admitting: Family Medicine

## 2023-07-20 VITALS — BP 132/70 | HR 71 | Temp 98.2°F | Wt 263.0 lb

## 2023-07-20 DIAGNOSIS — G5603 Carpal tunnel syndrome, bilateral upper limbs: Secondary | ICD-10-CM

## 2023-07-20 DIAGNOSIS — R202 Paresthesia of skin: Secondary | ICD-10-CM

## 2023-07-20 HISTORY — DX: Carpal tunnel syndrome, bilateral upper limbs: G56.03

## 2023-07-20 MED ORDER — VITAMIN D3 125 MCG (5000 UT) PO CAPS: 5000.0000 [IU] | ORAL_CAPSULE | Freq: Every day | ORAL | 3 refills | Status: AC

## 2023-07-20 MED ORDER — CETIRIZINE HCL 10 MG PO TABS: 10.0000 mg | ORAL_TABLET | Freq: Every day | ORAL | 3 refills | Status: DC

## 2023-07-20 MED ORDER — BETAMETHASONE DIPROPIONATE 0.05 % EX CREA: TOPICAL_CREAM | Freq: Two times a day (BID) | CUTANEOUS | 2 refills | Status: DC

## 2023-07-20 MED ORDER — NYSTATIN 100000 UNIT/GM EX POWD: 1.0000 | Freq: Three times a day (TID) | CUTANEOUS | 5 refills | Status: DC

## 2023-07-20 NOTE — Progress Notes (Signed)
   Subjective:    Patient ID: Bonnie Gonzalez, female    DOB: January 29, 1956, 67 y.o.   MRN: [id number]  HPI Here to follow up on issues and to discuss likely carpal tunnel symptoms. For the past year she has had intermittent numbness or pain in both hands and wrists. This is especially a problem when she is driving her car. She has been wearing wrist braces at home, but this has not helped much.    Review of Systems  Constitutional: Negative.   Respiratory: Negative.    Cardiovascular: Negative.   Neurological:  Positive for numbness.       Objective:   Physical Exam Constitutional:      Appearance: Normal appearance.     Comments: Wearing [place] oxygen and walking with a walker   Cardiovascular:     Rate and Rhythm: Normal rate and regular rhythm.     Pulses: Normal pulses.     Heart sounds: Normal heart sounds.  Pulmonary:     Effort: Pulmonary effort is normal.     Breath sounds: Normal breath sounds.  Neurological:     General: No focal deficit present.     Mental Status: She is alert and oriented to person, place, and time.           Assessment & Plan:  Carpal tunnel syndrome. We will set up NCS on both wrists.  Gershon Crane, MD

## 2023-07-24 ENCOUNTER — Other Ambulatory Visit: Payer: Self-pay | Admitting: Family Medicine

## 2023-07-24 DIAGNOSIS — J309 Allergic rhinitis, unspecified: Secondary | ICD-10-CM

## 2023-07-25 DIAGNOSIS — J432 Centrilobular emphysema: Secondary | ICD-10-CM | POA: Diagnosis not present

## 2023-07-25 DIAGNOSIS — J9611 Chronic respiratory failure with hypoxia: Secondary | ICD-10-CM | POA: Diagnosis not present

## 2023-07-25 DIAGNOSIS — J449 Chronic obstructive pulmonary disease, unspecified: Secondary | ICD-10-CM | POA: Diagnosis not present

## 2023-08-07 ENCOUNTER — Ambulatory Visit: Payer: PPO | Admitting: Neurology

## 2023-08-07 DIAGNOSIS — G5603 Carpal tunnel syndrome, bilateral upper limbs: Secondary | ICD-10-CM

## 2023-08-07 DIAGNOSIS — R202 Paresthesia of skin: Secondary | ICD-10-CM | POA: Diagnosis not present

## 2023-08-07 NOTE — Procedures (Signed)
[facility]  [address] [place], [address]  [place], [place] [address] Tel: [phone] Fax: [phone] Test Date:  08/07/2023  Patient: Bonnie Gonzalez DOB: [birth year] Physician: Nita Sickle, DO  Sex: Female Height: 5\' 0"  Ref Phys: Gershon Crane, MD  ID#: [id number]   Technician:    History: This is a 67 year old female referred for evaluation of bilateral hand paresthesias.  NCV & EMG Findings: Extensive electrodiagnostic testing of the right upper extremity and additional studies of the left shows:  Bilateral median sensory responses show prolonged latency (R4.5, L5.4 ms) and reduced amplitude (R5.4, L7.4 V).  Bilateral ulnar sensory responses are within normal limits. Bilateral median motor responses show prolonged latency (R4.5, R5.1 ms).  Bilateral ulnar motor responses are within normal limits. Chronic motor axon loss changes are seen affecting bilateral abductor pollicis brevis muscles, without accompanied active denervation.  Impression: Bilateral median neuropathy at or distal to the wrist, consistent with a clinical diagnosis of carpal tunnel syndrome.  Overall, these findings are moderate-to-severe in degree electrically.   ___________________________ Nita Sickle, DO    Nerve Conduction Studies   Stim Site NR Peak (ms) Norm Peak (ms) O-P Amp (V) Norm O-P Amp  Left Median Anti Sensory (2nd Digit)  32 C  Wrist    *5.4 <3.8 *7.4 >10  Right Median Anti Sensory (2nd Digit)  32 C  Wrist    *4.5 <3.8 *5.4 >10  Left Ulnar Anti Sensory (5th Digit)  32 C  Wrist    2.7 <3.2 19.1 >5  Right Ulnar Anti Sensory (5th Digit)  32 C  Wrist    2.6 <3.2 17.7 >5     Stim Site NR Onset (ms) Norm Onset (ms) O-P Amp (mV) Norm O-P Amp Site1 Site2 Delta-0 (ms) Dist (cm) Vel (m/s) Norm Vel (m/s)  Left Median Motor (Abd Poll Brev)  32 C  Wrist    *5.1 <4.0 8.4 >5 Elbow Wrist 5.2 27.0 52 >50  Elbow    10.3  8.0         Right Median Motor (Abd Poll Brev)  32 C  Wrist     *5.4 <4.0 8.3 >5 Elbow Wrist 5.1 28.0 55 >50  Elbow    10.5  7.7         Left Ulnar Motor (Abd Dig Minimi)  32 C  Wrist    2.5 <3.1 8.3 >7 B Elbow Wrist 3.9 21.0 54 >50  B Elbow    6.4  8.0  A Elbow B Elbow 1.9 10.0 53 >50  A Elbow    8.3  7.7         Right Ulnar Motor (Abd Dig Minimi)  32 C  Wrist    2.3 <3.1 8.6 >7 B Elbow Wrist 4.0 21.0 53 >50  B Elbow    6.3  8.4  A Elbow B Elbow 1.5 8.0 53 >50  A Elbow    7.8  8.4          Electromyography   Side Muscle Ins.Act Fibs Fasc Recrt Amp Dur Poly Activation Comment  Right 1stDorInt Nml Nml Nml Nml Nml Nml Nml Nml N/A  Right Abd Poll Brev Nml Nml Nml *1- *1+ *1+ *1+ Nml N/A  Right PronatorTeres Nml Nml Nml Nml Nml Nml Nml Nml N/A  Right Biceps Nml Nml Nml Nml Nml Nml Nml Nml N/A  Right Deltoid Nml Nml Nml Nml Nml Nml Nml Nml N/A  Left 1stDorInt Nml Nml Nml Nml Nml Nml Nml Nml  N/A  Left Abd Poll Brev Nml Nml Nml *1- *1+ *1+ *1+ Nml N/A  Left PronatorTeres Nml Nml Nml Nml Nml Nml Nml Nml N/A  Left Biceps Nml Nml Nml Nml Nml Nml Nml Nml N/A  Left Deltoid Nml Nml Nml Nml Nml Nml Nml Nml N/A      Waveforms:

## 2023-08-10 NOTE — Addendum Note (Signed)
Addended by: Gershon Crane A on: 08/10/2023 12:52 PM   Modules accepted: Orders

## 2023-08-20 DIAGNOSIS — G5603 Carpal tunnel syndrome, bilateral upper limbs: Secondary | ICD-10-CM | POA: Diagnosis not present

## 2023-08-25 DIAGNOSIS — J432 Centrilobular emphysema: Secondary | ICD-10-CM | POA: Diagnosis not present

## 2023-08-25 DIAGNOSIS — J449 Chronic obstructive pulmonary disease, unspecified: Secondary | ICD-10-CM | POA: Diagnosis not present

## 2023-08-25 DIAGNOSIS — J9611 Chronic respiratory failure with hypoxia: Secondary | ICD-10-CM | POA: Diagnosis not present

## 2023-08-31 DIAGNOSIS — G4733 Obstructive sleep apnea (adult) (pediatric): Secondary | ICD-10-CM | POA: Diagnosis not present

## 2023-09-03 ENCOUNTER — Other Ambulatory Visit: Payer: Self-pay | Admitting: Family Medicine

## 2023-09-03 DIAGNOSIS — Z794 Long term (current) use of insulin: Secondary | ICD-10-CM | POA: Diagnosis not present

## 2023-09-03 DIAGNOSIS — J309 Allergic rhinitis, unspecified: Secondary | ICD-10-CM

## 2023-09-03 DIAGNOSIS — E1165 Type 2 diabetes mellitus with hyperglycemia: Secondary | ICD-10-CM | POA: Diagnosis not present

## 2023-09-23 DIAGNOSIS — E119 Type 2 diabetes mellitus without complications: Secondary | ICD-10-CM | POA: Diagnosis not present

## 2023-09-24 DIAGNOSIS — J432 Centrilobular emphysema: Secondary | ICD-10-CM | POA: Diagnosis not present

## 2023-09-24 DIAGNOSIS — J449 Chronic obstructive pulmonary disease, unspecified: Secondary | ICD-10-CM | POA: Diagnosis not present

## 2023-09-24 DIAGNOSIS — J9611 Chronic respiratory failure with hypoxia: Secondary | ICD-10-CM | POA: Diagnosis not present

## 2023-09-28 ENCOUNTER — Telehealth: Payer: Self-pay | Admitting: *Deleted

## 2023-09-28 NOTE — Telephone Encounter (Signed)
Name: Bonnie Gonzalez  DOB: [birth year]  MRN: [id number]  Primary Cardiologist: Armanda Magic, MD   Preoperative team, please contact this patient and set up a phone call appointment for further preoperative risk assessment. Please obtain consent and complete medication review. Thank you for your help.  I confirm that guidance regarding antiplatelet and oral anticoagulation therapy has been completed and, if necessary, noted below.  None  I also confirmed the patient resides in the state of [place]. As per [facility] Medical Board telemedicine laws, the patient must reside in the state in which the provider is licensed.   Napoleon Form, Leodis Rains, NP 09/28/2023, 3:45 PM [facility]

## 2023-09-28 NOTE — Telephone Encounter (Signed)
Pre-operative Risk Assessment    Patient Name: Bonnie Gonzalez  DOB: [birth year] MRN: [id number]    DATE OF LAST VISIT: 06/05/23 Edd Fabian, FNP DATE OF NEXT VISIT: NONE   Request for Surgical Clearance    Procedure:   LEFT HAND CARPAL TUNNEL RELEASE   Date of Surgery:  Clearance TBD                                 Surgeon:  DR. Kelli Hope Surgeon's Group or Practice Name:  [facility] Phone number:  [phone] Fax number:  [phone]   Type of Clearance Requested:   - Medical ; NO MEDICATIONS INDICATED AS NEEDING TO BE HELD   Type of Anesthesia:  Not Indicated    Additional requests/questions:    Elpidio Anis   09/28/2023, 3:32 PM

## 2023-09-28 NOTE — Telephone Encounter (Signed)
Pt has been scheduled for tele pre op appt so the surgeon's office will schedule her surgery. Med rec and consent are done.

## 2023-09-28 NOTE — Telephone Encounter (Signed)
Pt has been scheduled for tele pre op appt so the surgeon's office will schedule her surgery. Med rec and consent are done.     Patient Consent for Virtual Visit        Bonnie Gonzalez has provided verbal consent on 09/28/2023 for a virtual visit (video or telephone).   CONSENT FOR VIRTUAL VISIT FOR:  Bonnie Gonzalez  By participating in this virtual visit I agree to the following:  I hereby voluntarily request, consent and authorize [facility] and its employed or contracted physicians, physician assistants, nurse practitioners or other licensed health care professionals (the Practitioner), to provide me with telemedicine health care services (the "Services") as deemed necessary by the treating Practitioner. I acknowledge and consent to receive the Services by the Practitioner via telemedicine. I understand that the telemedicine visit will involve communicating with the Practitioner through live audiovisual communication technology and the disclosure of certain medical information by electronic transmission. I acknowledge that I have been given the opportunity to request an in-person assessment or other available alternative prior to the telemedicine visit and am voluntarily participating in the telemedicine visit.  I understand that I have the right to withhold or withdraw my consent to the use of telemedicine in the course of my care at any time, without affecting my right to future care or treatment, and that the Practitioner or I may terminate the telemedicine visit at any time. I understand that I have the right to inspect all information obtained and/or recorded in the course of the telemedicine visit and may receive copies of available information for a reasonable fee.  I understand that some of the potential risks of receiving the Services via telemedicine include:  Delay or interruption in medical evaluation due to technological equipment failure or disruption; Information  transmitted may not be sufficient (e.g. poor resolution of images) to allow for appropriate medical decision making by the Practitioner; and/or  In rare instances, security protocols could fail, causing a breach of personal health information.  Furthermore, I acknowledge that it is my responsibility to provide information about my medical history, conditions and care that is complete and accurate to the best of my ability. I acknowledge that Practitioner's advice, recommendations, and/or decision may be based on factors not within their control, such as incomplete or inaccurate data provided by me or distortions of diagnostic images or specimens that may result from electronic transmissions. I understand that the practice of medicine is not an exact science and that Practitioner makes no warranties or guarantees regarding treatment outcomes. I acknowledge that a copy of this consent can be made available to me via my patient portal [facility] MyChart), or I can request a printed copy by calling the office of [facility].    I understand that my insurance will be billed for this visit.   I have read or had this consent read to me. I understand the contents of this consent, which adequately explains the benefits and risks of the Services being provided via telemedicine.  I have been provided ample opportunity to ask questions regarding this consent and the Services and have had my questions answered to my satisfaction. I give my informed consent for the services to be provided through the use of telemedicine in my medical care

## 2023-10-01 ENCOUNTER — Other Ambulatory Visit: Payer: Self-pay | Admitting: Family Medicine

## 2023-10-01 DIAGNOSIS — J309 Allergic rhinitis, unspecified: Secondary | ICD-10-CM

## 2023-10-08 ENCOUNTER — Ambulatory Visit: Payer: PPO | Attending: Nurse Practitioner

## 2023-10-08 DIAGNOSIS — Z01810 Encounter for preprocedural cardiovascular examination: Secondary | ICD-10-CM | POA: Diagnosis not present

## 2023-10-08 NOTE — Progress Notes (Signed)
Virtual Visit via Telephone Note   Because of Hiilei Iona Gonzalez's co-morbid illnesses, she is at least at moderate risk for complications without adequate follow up.  This format is felt to be most appropriate for this patient at this time.  The patient did not have access to video technology/had technical difficulties with video requiring transitioning to audio format only (telephone).  All issues noted in this document were discussed and addressed.  No physical exam could be performed with this format.  Please refer to the patient's chart for her consent to telehealth for [facility].  Evaluation Performed:  Preoperative cardiovascular risk assessment _____________   Date:  10/08/2023   Patient ID:  Bonnie Gonzalez, Bonnie Gonzalez 1956/03/29, MRN [id number] Patient Location:  Home Provider location:   Office  Primary Care Provider:  Nelwyn Salisbury, MD Primary Cardiologist:  Armanda Magic, MD  Chief Complaint / Patient Profile   67 y.o. y/o female with a h/o HFpEF,, HTN, HLD, OSA DM type II who is pending left hand carpal tunnel release and presents today for telephonic preoperative cardiovascular risk assessment.  History of Present Illness    Bonnie Gonzalez is a 67 y.o. female who presents via audio/video conferencing for a telehealth visit today.  Pt was last seen in cardiology clinic on 06/05/2023 by Edd Fabian, NP.  At that time Girtrude Chloe Miyoshi was doing well complaints of chest pain or SOB.  Reported some back pain would dissipate blood pressure was initially elevated but normal on recheck the patient is now pending procedure as outlined above. Since her last visit, she has been doing well with no new cardiac complaints.  She goes to the [facility] 3 to 4 days/week and works on various machines for activity.  She is also able to complete all of her ADLs without any limitations.  She denies chest pain, shortness of breath, lower extremity edema, fatigue, palpitations, melena, hematuria,  hemoptysis, diaphoresis, weakness, presyncope, syncope, orthopnea, and PND.   None  Past Medical History    Past Medical History:  Diagnosis Date   Acquired hypothyroidism 10/01/2010   Allergic rhinitis 07/20/2009   Arthritis    Back pain 10/18/2015   Bilateral leg edema 03/31/2018   Centrilobular emphysema 02/04/2019   HRCT 09/15/18   Chronic diastolic CHF (congestive heart failure) 01/28/2019   Chronic respiratory failure with hypoxia 02/04/2019   6 Min Walk 08/31/18 at [facility]   Chronic urticaria 12/21/2019   COPD (chronic obstructive pulmonary disease) 09/01/2018   Quit smoking 2010  - Spirometry 08/31/2018  FEV1 1.1 (51%)  Ratio 66 s prior rx with typical curvature  - 08/31/2018  After extensive coaching inhaler device,  effectiveness =    90% with elipta > try anoro sample > no better so d/c'  - PFT's  11/10/2018  FEV1 1.24 (59 % ) ratio 75 (ratio with fev1/VC =  63)  p no % improvement from saba p nothing prior to study with DLCO  105  % corrects to 132  %    DOE (dyspnea on exertion) 08/31/2018   Onset 05/2018 with background of unexplained leg swelling x 01/2018   -echo 04/13/18 just G1   diastolic dysfunction s PH   -  08/31/2018   Walked RA x one lap @ 185 stopped due to  Sob/ sats 89% at nl pace  - Spirometry 08/31/2018  FEV1 1.1 (51%)  Ratio 66 s prior rx  - 09/28/2018 could not do full pfts 09/28/2018  -  Collagen vasc profile 09/28/18  For ESR =  54 (was 102 before prednisone)  Neg collag   Dyshidrotic eczema 12/13/2010   Essential hypertension 01/26/2019   Generalized anxiety disorder with panic attacks 01/20/2018   GERD (gastroesophageal reflux disease) 11/03/2007   Headache 11/03/2007   Irritable bowel syndrome 07/13/2009   Major depressive disorder 07/17/2020   Mixed hyperlipidemia 07/20/2009   Obesity    OSA (obstructive sleep apnea) 07/26/2015   HST 08/2015 mild OSA, AHI 8/hour, lowest desaturation 81%, desaturations noted without respiratory events  Formatting  of this note might be different from the original. PSG 05/20/19 AHI 25.1   Osteopenia of multiple sites 06/01/2019   DEXA 05/24/2019: Right femur neck T- 2.2, left femur neck T- 1.5, right total femur T -0.5, left total femur T -0.5, AP total spine T- 1.9. FRAX 10-year probability of major osteoporotic fracture is 8.8% and a hip fracture is 1.2%  DEXA 05/05/2019: Right femur neck T- 1.9, left femur neck T- 2.0, right total femur T -1.3, left total femur   Oxygen deficiency    Postinflammatory pulmonary fibrosis 09/15/2018   ESR    10/161/9  = 102   > rx short term prednisone ? slt improvement in symptoms HRCT 09/15/2018  1. Very mild basilar subpleural ground-glass, reticulation and traction bronchiolectasis, findings which may be minimally progressive from 06/29/2017 and are indicative of interstitial lung disease such as nonspecific interstitial pneumonitis. Findings are indeterminate for UIP per consensus guidelin   Psychophysiological insomnia 03/29/2019   Type 2 diabetes mellitus 02/09/2017   UTI (urinary tract infection)    Vertigo 03/29/2014   Vitamin D deficiency 09/15/2020   Past Surgical History:  Procedure Laterality Date   ANKLE FRACTURE SURGERY Right    benign tumor      removed from groin   COLONOSCOPY WITH PROPOFOL N/A 05/15/2022   per Dr. Lavon Paganini, adenomatous polyps, repeat in 5 yrs   EYE SURGERY     HOT HEMOSTASIS N/A 05/15/2022   Procedure: HOT HEMOSTASIS (ARGON PLASMA COAGULATION/BICAP);  Surgeon: Napoleon Form, MD;  Location: [facility];  Service: Gastroenterology;  Laterality: N/A;   POLYPECTOMY  05/15/2022   Procedure: POLYPECTOMY;  Surgeon: Napoleon Form, MD;  Location: [facility];  Service: Gastroenterology;;   WRIST ARTHROCENTESIS N/A 2001    Allergies  Allergies  Allergen Reactions   Aspirin     Mother had to be resuscitated after ingesting this   Hydrocodone-Acetaminophen Hives   Oxycodone-Acetaminophen Hives   Penicillins Hives    Did it  involve swelling of the face/tongue/throat, SOB, or low BP? Yes-hives Did it involve sudden or severe rash/hives, skin peeling, or any reaction on the inside of your mouth or nose? Unk Did you need to seek medical attention at a hospital or doctor's office? Yes When did it last happen? Was in her 30's If all above answers are "NO", may proceed with cephalosporin use.  Did it involve swelling of the face/tongue/throat, SOB, or low BP? Yes-hives Did it involve sudden or severe rash/hives, skin peeling, or any reaction on the inside of your mouth or nose? Unk Did you need to seek medical attention at a hospital or doctor's office? Yes When did it last happen? Was in her 30's If all above answers are "NO", may proceed with cephalosporin use.   Codeine Hives   Fluoxetine Hcl Itching and Other (See Comments)    "Hair itching"   Trazodone Other (See Comments)    Couldn't walk    Home  Medications    Prior to Admission medications   Medication Sig Start Date End Date Taking? Authorizing Provider  albuterol (PROVENTIL HFA;VENTOLIN HFA) 108 (90 Base) MCG/ACT inhaler Inhale 2 puffs into the lungs every 4 (four) hours as needed for wheezing or shortness of breath. 06/17/18   Nelwyn Salisbury, MD  ALPRAZolam Prudy Feeler) 0.5 MG tablet TAKE 1 TABLET (0.5 MG TOTAL) BY MOUTH 2 (TWO) TIMES DAILY AS NEEDED. Patient taking differently: Take 0.25-0.5 mg by mouth 2 (two) times daily as needed for anxiety or sleep. 07/21/18   Nelwyn Salisbury, MD  atorvastatin (LIPITOR) 80 MG tablet Take 1 tablet (80 mg total) by mouth daily. 04/21/23   Nelwyn Salisbury, MD  azelastine (ASTELIN) 0.1 % nasal spray Place 1 spray into both nostrils 2 (two) times daily as needed for allergies. 07/25/22   Nelwyn Salisbury, MD  benzonatate (TESSALON) 200 MG capsule TAKE 1 CAPSULE BY MOUTH EVERY 8 HOURS AS NEEDED FOR COUGH 12/03/22   Nelwyn Salisbury, MD  betamethasone dipropionate 0.05 % cream Apply topically 2 (two) times daily. 07/20/23   Nelwyn Salisbury, MD  buPROPion ER [facility] SR) 100 MG 12 hr tablet Take 100 mg by mouth daily. 01/06/23   [provider]  cetirizine (ZYRTEC) 10 MG tablet Take 1 tablet (10 mg total) by mouth daily. 07/20/23   Nelwyn Salisbury, MD  Cholecalciferol (VITAMIN D3) 125 MCG (5000 UT) CAPS Take 1 capsule (5,000 Units total) by mouth daily. 07/20/23   Nelwyn Salisbury, MD  Cholecalciferol 125 MCG (5000 UT) TABS Take 5,000 Units by mouth in the morning. 12/23/19   [provider]  Continuous Glucose Sensor (FREESTYLE LIBRE 2 SENSOR) MISC Inject 1 kit into the skin every 14 (fourteen) days. 10/28/22   [provider]  Dextromethorphan-guaiFENesin [facility] COUGH & CHEST CONGEST PO) Take 1 capsule by mouth as needed (Patient taking as needed).    [provider]  DULoxetine (CYMBALTA) 60 MG capsule Take 1 capsule (60 mg total) by mouth daily. Patient taking differently: Take 120 mg by mouth at bedtime. Take two capsules daily 01/19/18   Nelwyn Salisbury, MD  esomeprazole (NEXIUM) 40 MG capsule Take 1 capsule (40 mg total) by mouth 2 (two) times daily before a meal. 05/13/23   Nelwyn Salisbury, MD  Fluticasone-Umeclidin-Vilant (TRELEGY ELLIPTA) 100-62.5-25 MCG/INH AEPB Inhale 1 puff into the lungs daily.    [provider]  furosemide (LASIX) 20 MG tablet Take 1 tablet (20 mg total) by mouth daily. 04/21/23   Nelwyn Salisbury, MD  glucose 4 GM chewable tablet Chew 1 tablet (4 g total) by mouth as needed for low blood sugar (< 70). 01/16/19   Noralee Stain, DO  hydrOXYzine (VISTARIL) 25 MG capsule Take 1 capsule by mouth three times daily as needed 10/01/23   Nelwyn Salisbury, MD  insulin aspart (NOVOLOG) 100 UNIT/ML injection Use sliding scale provided at time of discharge, administer 3 times daily with meals Patient taking differently: Inject 15-48 Units into the skin See admin instructions. Inject 20-40 units subcutaneously 3 times daily with meals and inject 15-35 units subcutaneously at night if  needed with nighttime snacking 01/16/19 05/12/24  Noralee Stain, DO  insulin glargine, 2 Unit Dial, (TOUJEO MAX SOLOSTAR) 300 UNIT/ML Solostar Pen Inject 80 Units into the skin 2 (two) times daily.    [provider]  Insulin Pen Needle (B-D UF III MINI PEN NEEDLES) 31G X 5 MM MISC Use to administer insulin  five times a day 11/06/20   [provider]  levothyroxine (SYNTHROID) 175 MCG tablet TAKE 1 TABLET BY MOUTH ONCE DAILY BEFORE BREAKFAST. *Labs required for future refills* 04/21/23   Nelwyn Salisbury, MD  linaclotide [facility]) 290 MCG CAPS capsule Take 1 capsule (290 mcg total) by mouth daily before breakfast. 01/23/23   Nandigam, Eleonore Chiquito, MD  methocarbamol (ROBAXIN) 500 MG tablet Take 1 tablet (500 mg total) by mouth every 6 (six) hours as needed for muscle spasms. 05/24/21   Nelwyn Salisbury, MD  Misc Natural Products (COMPLETE MENOPAUSE HEALTH PO) Take by mouth. [place] complete menopause gummies    [provider]  MOUNJARO 12.5 MG/0.5ML Pen Inject 12.5 mg into the skin once a week. 10/28/22   [provider]  naproxen (NAPROSYN) 500 MG tablet Take 1 tablet by mouth twice daily 12/29/22   Nelwyn Salisbury, MD  Nerve Stimulator (CLEVER CHOICE TENS UNIT) DEVI Place 1 Dose onto the skin as needed (pain.). Tens 7000    [provider]  nystatin (MYCOSTATIN/NYSTOP) powder Apply 1 Application topically 3 (three) times daily. 07/20/23   Nelwyn Salisbury, MD  OXYGEN Inhale 3 L into the lungs continuous.    [provider]  Polyethyl Glycol-Propyl Glycol (SYSTANE OP) Apply to eye.    [provider]  primidone (MYSOLINE) 50 MG tablet Take 1 tablet (50 mg total) by mouth at bedtime. 03/19/23   Tat, Octaviano Batty, DO  Rhubarb (ESTROVEN COMPLETE PO) Take by mouth.    [provider]  temazepam (RESTORIL) 15 MG capsule Take 15 mg by mouth at bedtime. 10/07/21   [provider]  terbinafine (LAMISIL) 250 MG tablet Take 1 tablet by mouth  once daily 06/02/23   Nelwyn Salisbury, MD    Physical Exam    Vital Signs:  Genasis Brantlee Hinde does not have vital signs available for review today.132/70  Given telephonic nature of communication, physical exam is limited. AAOx3. NAD. Normal affect.  Speech and respirations are unlabored.  Accessory Clinical Findings    None  Assessment & Plan    1.  Preoperative Cardiovascular Risk Assessment: -Patient's RCRI score is 6.6%  The patient affirms she has been doing well without any new cardiac symptoms. They are able to achieve 4 METS without cardiac limitations. Therefore, based on ACC/AHA guidelines, the patient would be at acceptable risk for the planned procedure without further cardiovascular testing. The patient was advised that if she develops new symptoms prior to surgery to contact our office to arrange for a follow-up visit, and she verbalized understanding.   The patient was advised that if she develops new symptoms prior to surgery to contact our office to arrange for a follow-up visit, and she verbalized understanding.  None  A copy of this note will be routed to requesting surgeon.  Time:   Today, I have spent 7 minutes with the patient with telehealth technology discussing medical history, symptoms, and management plan.     Napoleon Form, Leodis Rains, NP  10/08/2023, 7:12 AM

## 2023-10-13 DIAGNOSIS — F330 Major depressive disorder, recurrent, mild: Secondary | ICD-10-CM | POA: Diagnosis not present

## 2023-10-13 DIAGNOSIS — F5101 Primary insomnia: Secondary | ICD-10-CM | POA: Diagnosis not present

## 2023-10-13 DIAGNOSIS — F411 Generalized anxiety disorder: Secondary | ICD-10-CM | POA: Diagnosis not present

## 2023-10-17 ENCOUNTER — Other Ambulatory Visit: Payer: Self-pay | Admitting: Family Medicine

## 2023-10-20 ENCOUNTER — Encounter: Payer: Self-pay | Admitting: Family Medicine

## 2023-10-25 DIAGNOSIS — J432 Centrilobular emphysema: Secondary | ICD-10-CM | POA: Diagnosis not present

## 2023-10-25 DIAGNOSIS — J449 Chronic obstructive pulmonary disease, unspecified: Secondary | ICD-10-CM | POA: Diagnosis not present

## 2023-10-25 DIAGNOSIS — J9611 Chronic respiratory failure with hypoxia: Secondary | ICD-10-CM | POA: Diagnosis not present

## 2023-10-27 ENCOUNTER — Other Ambulatory Visit: Payer: Self-pay | Admitting: Family Medicine

## 2023-10-27 DIAGNOSIS — J309 Allergic rhinitis, unspecified: Secondary | ICD-10-CM

## 2023-10-28 ENCOUNTER — Telehealth: Payer: PPO | Admitting: Family Medicine

## 2023-10-28 ENCOUNTER — Encounter: Payer: Self-pay | Admitting: Family Medicine

## 2023-10-28 DIAGNOSIS — R232 Flushing: Secondary | ICD-10-CM | POA: Diagnosis not present

## 2023-10-28 HISTORY — DX: Flushing: R23.2

## 2023-10-28 MED ORDER — VEOZAH 45 MG PO TABS: 45.0000 mg | ORAL_TABLET | Freq: Every day | ORAL | 11 refills | Status: DC

## 2023-10-28 NOTE — Progress Notes (Signed)
Subjective:    Patient ID: Bonnie Gonzalez, female    DOB: [birth year], 67 y.o.   MRN: [id number]  HPI Virtual Visit via Video Note  I connected with the patient on 10/28/23 at 10:30 AM EST by a video enabled telemedicine application and verified that I am speaking with the correct person using two identifiers.  Location patient: home Location provider:work or home office Persons participating in the virtual visit: patient, provider  I discussed the limitations of evaluation and management by telemedicine and the availability of in person appointments. The patient expressed understanding and agreed to proceed.   HPI: Here asking for help with hot flashes. She has dealt with these ever since she went into menopause. She has tried black cohosh and Estroven OTC with no relief. She recently saw an advertisement for [facility], and she wants to try it.    ROS: See pertinent positives and negatives per HPI.  Past Medical History:  Diagnosis Date   Acquired hypothyroidism 10/01/2010   Allergic rhinitis 07/20/2009   Arthritis    Back pain 10/18/2015   Bilateral leg edema 03/31/2018   Centrilobular emphysema 02/04/2019   HRCT 09/15/18   Chronic diastolic CHF (congestive heart failure) 01/28/2019   Chronic respiratory failure with hypoxia 02/04/2019   6 Min Walk 08/31/18 at [facility]   Chronic urticaria 12/21/2019   COPD (chronic obstructive pulmonary disease) 09/01/2018   Quit smoking 2010  - Spirometry 08/31/2018  FEV1 1.1 (51%)  Ratio 66 s prior rx with typical curvature  - 08/31/2018  After extensive coaching inhaler device,  effectiveness =    90% with elipta > try anoro sample > no better so d/c'  - PFT's  11/10/2018  FEV1 1.24 (59 % ) ratio 75 (ratio with fev1/VC =  63)  p no % improvement from saba p nothing prior to study with DLCO  105  % corrects to 132  %    DOE (dyspnea on exertion) 08/31/2018   Onset 05/2018 with background of unexplained leg swelling x 01/2018   -echo  04/13/18 just G1   diastolic dysfunction s PH   -  08/31/2018   Walked RA x one lap @ 185 stopped due to  Sob/ sats 89% at nl pace  - Spirometry 08/31/2018  FEV1 1.1 (51%)  Ratio 66 s prior rx  - 09/28/2018 could not do full pfts 09/28/2018  - Collagen vasc profile 09/28/18  For ESR =  54 (was 102 before prednisone)  Neg collag   Dyshidrotic eczema 12/13/2010   Essential hypertension 01/26/2019   Generalized anxiety disorder with panic attacks 01/20/2018   GERD (gastroesophageal reflux disease) 11/03/2007   Headache 11/03/2007   Irritable bowel syndrome 07/13/2009   Major depressive disorder 07/17/2020   Mixed hyperlipidemia 07/20/2009   Obesity    OSA (obstructive sleep apnea) 07/26/2015   HST 08/2015 mild OSA, AHI 8/hour, lowest desaturation 81%, desaturations noted without respiratory events  Formatting of this note might be different from the original. PSG 05/20/19 AHI 25.1   Osteopenia of multiple sites 06/01/2019   DEXA 05/24/2019: Right femur neck T- 2.2, left femur neck T- 1.5, right total femur T -0.5, left total femur T -0.5, AP total spine T- 1.9. FRAX 10-year probability of major osteoporotic fracture is 8.8% and a hip fracture is 1.2%  DEXA 05/05/2019: Right femur neck T- 1.9, left femur neck T- 2.0, right total femur T -1.3, left total femur   Oxygen deficiency    Postinflammatory pulmonary fibrosis  09/15/2018   ESR    10/161/9  = 102   > rx short term prednisone ? slt improvement in symptoms HRCT 09/15/2018  1. Very mild basilar subpleural ground-glass, reticulation and traction bronchiolectasis, findings which may be minimally progressive from 06/29/2017 and are indicative of interstitial lung disease such as nonspecific interstitial pneumonitis. Findings are indeterminate for UIP per consensus guidelin   Psychophysiological insomnia 03/29/2019   Type 2 diabetes mellitus 02/09/2017   UTI (urinary tract infection)    Vertigo 03/29/2014   Vitamin D deficiency 09/15/2020    Past Surgical  History:  Procedure Laterality Date   ANKLE FRACTURE SURGERY Right    benign tumor      removed from groin   COLONOSCOPY WITH PROPOFOL N/A 05/15/2022   per Dr. Lavon Paganini, adenomatous polyps, repeat in 5 yrs   EYE SURGERY     HOT HEMOSTASIS N/A 05/15/2022   Procedure: HOT HEMOSTASIS (ARGON PLASMA COAGULATION/BICAP);  Surgeon: Napoleon Form, MD;  Location: [facility];  Service: Gastroenterology;  Laterality: N/A;   POLYPECTOMY  05/15/2022   Procedure: POLYPECTOMY;  Surgeon: Napoleon Form, MD;  Location: [facility];  Service: Gastroenterology;;   WRIST ARTHROCENTESIS N/A 2001    Family History  Problem Relation Age of Onset   Arthritis/Rheumatoid Mother    Alzheimer's disease Father    Diabetes Mellitus II Father    Dementia Father    Diabetes Mellitus II Sister    Dementia Brother    Diabetes Mellitus II Brother    Asthma Other        fhx   Depression Other        fhx   Diabetes Other        fhx   Parkinsonism Other        fhx   Lupus Other        fhx     Current Outpatient Medications:    albuterol (PROVENTIL HFA;VENTOLIN HFA) 108 (90 Base) MCG/ACT inhaler, Inhale 2 puffs into the lungs every 4 (four) hours as needed for wheezing or shortness of breath., Disp: 1 Inhaler, Rfl: 11   ALPRAZolam (XANAX) 0.5 MG tablet, TAKE 1 TABLET (0.5 MG TOTAL) BY MOUTH 2 (TWO) TIMES DAILY AS NEEDED. (Patient taking differently: Take 0.25-0.5 mg by mouth 2 (two) times daily as needed for anxiety or sleep.), Disp: 60 tablet, Rfl: 5   atorvastatin (LIPITOR) 80 MG tablet, Take 1 tablet (80 mg total) by mouth daily., Disp: 90 tablet, Rfl: 3   azelastine (ASTELIN) 0.1 % nasal spray, Place 1 spray into both nostrils 2 (two) times daily as needed for allergies., Disp: 30 mL, Rfl: 1   benzonatate (TESSALON) 200 MG capsule, TAKE 1 CAPSULE BY MOUTH EVERY 8 HOURS AS NEEDED FOR COUGH, Disp: 60 capsule, Rfl: 1   betamethasone dipropionate 0.05 % cream, Apply topically 2 (two) times daily.,  Disp: 45 g, Rfl: 2   buPROPion ER (WELLBUTRIN SR) 100 MG 12 hr tablet, Take 100 mg by mouth daily., Disp: , Rfl:    cetirizine (ZYRTEC) 10 MG tablet, Take 1 tablet (10 mg total) by mouth daily., Disp: 90 tablet, Rfl: 3   Cholecalciferol (VITAMIN D3) 125 MCG (5000 UT) CAPS, Take 1 capsule (5,000 Units total) by mouth daily., Disp: 12 capsule, Rfl: 3   Cholecalciferol 125 MCG (5000 UT) TABS, Take 5,000 Units by mouth in the morning., Disp: , Rfl:    Continuous Glucose Sensor (FREESTYLE LIBRE 2 SENSOR) MISC, Inject 1 kit into the skin every 14 (fourteen)  days., Disp: , Rfl:    Dextromethorphan-guaiFENesin (MUCINEX COUGH & CHEST CONGEST PO), Take 1 capsule by mouth as needed (Patient taking as needed)., Disp: , Rfl:    DULoxetine (CYMBALTA) 60 MG capsule, Take 1 capsule (60 mg total) by mouth daily. (Patient taking differently: Take 120 mg by mouth at bedtime. Take two capsules daily), Disp: 90 capsule, Rfl: 3   esomeprazole (NEXIUM) 40 MG capsule, Take 1 capsule (40 mg total) by mouth 2 (two) times daily before a meal., Disp: 180 capsule, Rfl: 3   Fluticasone-Umeclidin-Vilant (TRELEGY ELLIPTA) 100-62.5-25 MCG/INH AEPB, Inhale 1 puff into the lungs daily., Disp: , Rfl:    furosemide (LASIX) 20 MG tablet, Take 1 tablet (20 mg total) by mouth daily., Disp: 90 tablet, Rfl: 3   glucose 4 GM chewable tablet, Chew 1 tablet (4 g total) by mouth as needed for low blood sugar (< 70)., Disp: 50 tablet, Rfl: 0   hydrOXYzine (VISTARIL) 25 MG capsule, Take 1 capsule by mouth three times daily as needed, Disp: 90 capsule, Rfl: 0   insulin aspart (NOVOLOG) 100 UNIT/ML injection, Use sliding scale provided at time of discharge, administer 3 times daily with meals (Patient taking differently: Inject 15-48 Units into the skin See admin instructions. Inject 20-40 units subcutaneously 3 times daily with meals and inject 15-35 units subcutaneously at night if needed with nighttime snacking), Disp: 10 mL, Rfl: 0   insulin  glargine, 2 Unit Dial, (TOUJEO MAX SOLOSTAR) 300 UNIT/ML Solostar Pen, Inject 80 Units into the skin 2 (two) times daily., Disp: , Rfl:    Insulin Pen Needle (B-D UF III MINI PEN NEEDLES) 31G X 5 MM MISC, Use to administer insulin five times a day, Disp: , Rfl:    levothyroxine (SYNTHROID) 175 MCG tablet, TAKE 1 TABLET BY MOUTH ONCE DAILY BEFORE BREAKFAST. *Labs required for future refills*, Disp: 90 tablet, Rfl: 3   linaclotide (LINZESS) 290 MCG CAPS capsule, Take 1 capsule (290 mcg total) by mouth daily before breakfast., Disp: 30 capsule, Rfl: 11   methocarbamol (ROBAXIN) 500 MG tablet, Take 1 tablet (500 mg total) by mouth every 6 (six) hours as needed for muscle spasms., Disp: 120 tablet, Rfl: 5   Misc Natural Products (COMPLETE MENOPAUSE HEALTH PO), Take by mouth. [place] complete menopause gummies, Disp: , Rfl:    MOUNJARO 12.5 MG/0.5ML Pen, Inject 12.5 mg into the skin once a week., Disp: , Rfl:    naproxen (NAPROSYN) 500 MG tablet, Take 1 tablet by mouth twice daily, Disp: 60 tablet, Rfl: 5   Nerve Stimulator (CLEVER CHOICE TENS UNIT) DEVI, Place 1 Dose onto the skin as needed (pain.). Tens 7000, Disp: , Rfl:    NYAMYC powder, APPLY 1 APPLICATION TOPICALLY 2 TIMES DAILY AS NEEDED FOR RASH, Disp: 15 g, Rfl: 0   OXYGEN, Inhale 3 L into the lungs continuous., Disp: , Rfl:    Polyethyl Glycol-Propyl Glycol (SYSTANE OP), Apply to eye., Disp: , Rfl:    primidone (MYSOLINE) 50 MG tablet, Take 1 tablet (50 mg total) by mouth at bedtime., Disp: 90 tablet, Rfl: 3   Rhubarb (ESTROVEN COMPLETE PO), Take by mouth., Disp: , Rfl:    temazepam (RESTORIL) 15 MG capsule, Take 15 mg by mouth at bedtime., Disp: , Rfl:    terbinafine (LAMISIL) 250 MG tablet, Take 1 tablet by mouth once daily, Disp: 90 tablet, Rfl: 0  EXAM:  VITALS per patient if applicable:  GENERAL: alert, oriented, appears well and in no acute distress  HEENT: atraumatic, conjunttiva clear, no obvious abnormalities on inspection  of external nose and ears  NECK: normal movements of the head and neck  LUNGS: on inspection no signs of respiratory distress, breathing rate appears normal, no obvious gross SOB, gasping or wheezing  CV: no obvious cyanosis  MS: moves all visible extremities without noticeable abnormality  PSYCH/NEURO: pleasant and cooperative, no obvious depression or anxiety, speech and thought processing grossly intact  ASSESSMENT AND PLAN: Hot flashes. I reviewed the product information and indeed this medication is non-hormonal. We will start her on Veozah 45 mg daily.  Gershon Crane, MD  Discussed the following assessment and plan:  No diagnosis found.     I discussed the assessment and treatment plan with the patient. The patient was provided an opportunity to ask questions and all were answered. The patient agreed with the plan and demonstrated an understanding of the instructions.   The patient was advised to call back or seek an in-person evaluation if the symptoms worsen or if the condition fails to improve as anticipated.      Review of Systems     Objective:   Physical Exam        Assessment & Plan:

## 2023-11-03 ENCOUNTER — Telehealth: Payer: Self-pay

## 2023-11-03 ENCOUNTER — Other Ambulatory Visit (HOSPITAL_COMMUNITY): Payer: Self-pay

## 2023-11-03 NOTE — Telephone Encounter (Signed)
Pharmacy Patient Advocate Encounter   Received notification from Patient Advice Request messages that prior authorization for Veozah 45MG  tablets is required/requested.   Insurance verification completed.   The patient is insured through [facility] ADVANTAGE/RX ADVANCE .   Per test claim: PA required; PA submitted to above mentioned insurance via CoverMyMeds Key/confirmation #/EOC [id number] Status is pending

## 2023-11-03 NOTE — Telephone Encounter (Signed)
Pharmacy Patient Advocate Encounter  Received notification from [facility] ADVANTAGE/RX ADVANCE that Prior Authorization for Veozah 45MG  tablets has been APPROVED from 11/03/23 to 11/30/24. Ran test claim, Copay is $11.20. This test claim was processed through [facility]- copay amounts may vary at other pharmacies due to pharmacy/plan contracts, or as the patient moves through the different stages of their insurance plan.   PA #/Case ID/Reference #: [id number]

## 2023-11-04 NOTE — Telephone Encounter (Signed)
Noted  

## 2023-11-05 NOTE — Telephone Encounter (Signed)
Spoke with pt advised of the PA approval and to contact her pharmacy for Rx, verbalized understanding

## 2023-11-06 DIAGNOSIS — I5032 Chronic diastolic (congestive) heart failure: Secondary | ICD-10-CM | POA: Diagnosis not present

## 2023-11-06 DIAGNOSIS — J432 Centrilobular emphysema: Secondary | ICD-10-CM | POA: Diagnosis not present

## 2023-11-06 DIAGNOSIS — I5189 Other ill-defined heart diseases: Secondary | ICD-10-CM | POA: Diagnosis not present

## 2023-11-06 DIAGNOSIS — J9611 Chronic respiratory failure with hypoxia: Secondary | ICD-10-CM | POA: Diagnosis not present

## 2023-11-06 DIAGNOSIS — J449 Chronic obstructive pulmonary disease, unspecified: Secondary | ICD-10-CM | POA: Diagnosis not present

## 2023-11-06 NOTE — Telephone Encounter (Signed)
Pt is aware that Rx PA has been approved

## 2023-11-09 DIAGNOSIS — G5602 Carpal tunnel syndrome, left upper limb: Secondary | ICD-10-CM | POA: Diagnosis not present

## 2023-11-09 DIAGNOSIS — E118 Type 2 diabetes mellitus with unspecified complications: Secondary | ICD-10-CM | POA: Diagnosis not present

## 2023-11-09 DIAGNOSIS — Z01810 Encounter for preprocedural cardiovascular examination: Secondary | ICD-10-CM | POA: Diagnosis not present

## 2023-11-24 DIAGNOSIS — J9611 Chronic respiratory failure with hypoxia: Secondary | ICD-10-CM | POA: Diagnosis not present

## 2023-11-24 DIAGNOSIS — J449 Chronic obstructive pulmonary disease, unspecified: Secondary | ICD-10-CM | POA: Diagnosis not present

## 2023-11-24 DIAGNOSIS — J432 Centrilobular emphysema: Secondary | ICD-10-CM | POA: Diagnosis not present

## 2023-12-03 ENCOUNTER — Telehealth: Payer: Self-pay | Admitting: *Deleted

## 2023-12-03 ENCOUNTER — Other Ambulatory Visit: Payer: Self-pay | Admitting: Family Medicine

## 2023-12-03 NOTE — Telephone Encounter (Signed)
 Copied from CRM [id number]. Topic: Clinical - Medication Refill >> Dec 03, 2023 11:53 AM Macario HERO wrote: Most Recent Primary Care Visit:  Provider: JOHNNY SENIOR A  Department: [facility]  Visit Type: [facility] VIDEO VISIT  Date: 10/28/2023  Medication: Fezolinetant  (VEOZAH ) 45 MG TABS [[id number]]  Has the patient contacted their pharmacy? Yes (Agent: If no, request that the patient contact the pharmacy for the refill. If patient does not wish to contact the pharmacy document the reason why and proceed with request.) (Agent: If yes, when and what did the pharmacy advise?)  Is this the correct pharmacy for this prescription? Yes If no, delete pharmacy and type the correct one.  This is the patient's preferred pharmacy:  [facility] [address] [place], [place] - [address] [place] [address] Phone: [phone] Fax: [phone]   Has the prescription been filled recently? Yes  Is the patient out of the medication? No  Has the patient been seen for an appointment in the last year OR does the patient have an upcoming appointment? Yes  Can we respond through MyChart? Yes  Agent: Please be advised that Rx refills may take up to 3 business days. We ask that you follow-up with your pharmacy.

## 2023-12-07 ENCOUNTER — Other Ambulatory Visit (HOSPITAL_COMMUNITY): Payer: Self-pay

## 2023-12-07 ENCOUNTER — Telehealth: Payer: Self-pay

## 2023-12-07 NOTE — Telephone Encounter (Signed)
 Tried to resubmit under new year-patient has same plan, request was cancelled due to approval on file. Test claim shows PA still needed.  Called patients plan-they stated that medication was moved from non-formulary to formulary but that the approval should still stand. Representative will be giving me a call back once they have looked further into this issue.

## 2023-12-07 NOTE — Telephone Encounter (Signed)
 PA request has been Approved. New Encounter created for follow up. For additional info see Pharmacy Prior Auth telephone encounter from 01/06.

## 2023-12-07 NOTE — Telephone Encounter (Signed)
 Pharmacy Patient Advocate Encounter  Received notification from [employer] ADVANTAGE/RX ADVANCE that Prior Authorization for Veozah  has been APPROVED from 12/07/2023 to 11/30/2024. Ran test claim, Copay is $12.15. This test claim was processed through [facility]- copay amounts may vary at other pharmacies due to pharmacy/plan contracts, or as the patient moves through the different stages of their insurance plan.

## 2023-12-08 NOTE — Telephone Encounter (Signed)
 Patient notified of update  and verbalized understanding.

## 2023-12-12 ENCOUNTER — Other Ambulatory Visit: Payer: Self-pay | Admitting: Gastroenterology

## 2023-12-16 DIAGNOSIS — Z794 Long term (current) use of insulin: Secondary | ICD-10-CM | POA: Diagnosis not present

## 2023-12-16 DIAGNOSIS — E1165 Type 2 diabetes mellitus with hyperglycemia: Secondary | ICD-10-CM | POA: Diagnosis not present

## 2023-12-25 DIAGNOSIS — J9611 Chronic respiratory failure with hypoxia: Secondary | ICD-10-CM | POA: Diagnosis not present

## 2023-12-25 DIAGNOSIS — J449 Chronic obstructive pulmonary disease, unspecified: Secondary | ICD-10-CM | POA: Diagnosis not present

## 2023-12-25 DIAGNOSIS — J432 Centrilobular emphysema: Secondary | ICD-10-CM | POA: Diagnosis not present

## 2024-01-12 ENCOUNTER — Other Ambulatory Visit: Payer: Self-pay | Admitting: Gastroenterology

## 2024-01-13 DIAGNOSIS — F411 Generalized anxiety disorder: Secondary | ICD-10-CM | POA: Diagnosis not present

## 2024-01-13 DIAGNOSIS — F330 Major depressive disorder, recurrent, mild: Secondary | ICD-10-CM | POA: Diagnosis not present

## 2024-01-13 DIAGNOSIS — F5101 Primary insomnia: Secondary | ICD-10-CM | POA: Diagnosis not present

## 2024-01-25 DIAGNOSIS — J432 Centrilobular emphysema: Secondary | ICD-10-CM | POA: Diagnosis not present

## 2024-01-25 DIAGNOSIS — J449 Chronic obstructive pulmonary disease, unspecified: Secondary | ICD-10-CM | POA: Diagnosis not present

## 2024-01-25 DIAGNOSIS — J9611 Chronic respiratory failure with hypoxia: Secondary | ICD-10-CM | POA: Diagnosis not present

## 2024-02-08 ENCOUNTER — Other Ambulatory Visit: Payer: Self-pay | Admitting: Gastroenterology

## 2024-02-15 ENCOUNTER — Other Ambulatory Visit: Payer: Self-pay | Admitting: Family Medicine

## 2024-02-24 ENCOUNTER — Other Ambulatory Visit: Payer: Self-pay | Admitting: Family Medicine

## 2024-02-24 DIAGNOSIS — J309 Allergic rhinitis, unspecified: Secondary | ICD-10-CM

## 2024-03-21 NOTE — Progress Notes (Unsigned)
 Assessment/Plan:    1.  Tremor  -As previous, the medications for her lungs are likely exacerbating this, even though she certainly may have a degree of essential tremor as well.  -Patient reports primidone  markedly helped.  We will continue on primidone , 50 mg daily.   2.  Memory change  -Neurocognitive testing in August, 2022 was normal.  No evidence of a neurodegenerative process.  She stopped the donepezil  that she had been on for several years.  She thought potentially memory has declined since then, but she is also caregiving for her older brother who has moved in with her and has been diagnosed with dementia, so I suspect stress is playing a role.  3.  Bilateral carpal tunnel  - Status post release on the left on November 09, 2023.  Subjective:   Bonnie Gonzalez was seen today in follow up for tremor.  My previous records were reviewed prior to todays visit.  She continues on primidone , 50 mg daily.  She is tolerating the medication well, without side effects.  Tremor has been ***.  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  She underwent carpal tunnel surgery on the left on December 9.    PREVIOUS MEDICATIONS: none to date  ALLERGIES:   Allergies  Allergen Reactions   Aspirin     Mother had to be resuscitated after ingesting this   Hydrocodone-Acetaminophen  Hives   Oxycodone-Acetaminophen  Hives   Penicillins Hives    Did it involve swelling of the face/tongue/throat, SOB, or low BP? Yes-hives Did it involve sudden or severe rash/hives, skin peeling, or any reaction on the inside of your mouth or nose? Unk Did you need to seek medical attention at a hospital or doctor's office? Yes When did it last happen? Was in her 30's If all above answers are "NO", may proceed with cephalosporin use.  Did it involve swelling of the face/tongue/throat, SOB,  or low BP? Yes-hives Did it involve sudden or severe rash/hives, skin peeling, or any reaction on the inside of your mouth or nose? Unk Did you need to seek medical attention at a hospital or doctor's office? Yes When did it last happen? Was in her 30's If all above answers are "NO", may proceed with cephalosporin use.   Codeine Hives   Fluoxetine Hcl Itching and Other (See Comments)    "Hair itching"   Trazodone  Other (See Comments)    Couldn't walk    CURRENT MEDICATIONS:  Outpatient Encounter Medications as of 03/22/2024  Medication Sig   albuterol  (PROVENTIL  HFA;VENTOLIN  HFA) 108 (90 Base) MCG/ACT inhaler Inhale 2 puffs into the lungs every 4 (four) hours as needed for wheezing or shortness of breath.   ALPRAZolam  (XANAX ) 0.5 MG tablet TAKE 1 TABLET (0.5 MG TOTAL) BY MOUTH 2 (TWO) TIMES DAILY AS NEEDED. (Patient taking differently: Take 0.25-0.5 mg by mouth 2 (two) times daily as needed for anxiety or sleep.)   atorvastatin  (LIPITOR) 80 MG tablet Take 1 tablet (80 mg total) by mouth daily.   azelastine  (ASTELIN ) 0.1 % nasal spray Place 1 spray into both nostrils 2 (two) times daily as needed for allergies.   benzonatate  (TESSALON ) 200 MG capsule TAKE 1 CAPSULE BY MOUTH EVERY 8 HOURS AS NEEDED FOR COUGH   betamethasone  dipropionate 0.05 % cream Apply topically 2 (two) times daily.   buPROPion ER (WELLBUTRIN SR) 100 MG  12 hr tablet Take 100 mg by mouth daily.   cetirizine  (ZYRTEC ) 10 MG tablet Take 1 tablet (10 mg total) by mouth daily.   Cholecalciferol (VITAMIN D3) 125 MCG (5000 UT) CAPS Take 1 capsule (5,000 Units total) by mouth daily.   Cholecalciferol 125 MCG (5000 UT) TABS Take 5,000 Units by mouth in the morning.   Continuous Glucose Sensor (FREESTYLE LIBRE 2 SENSOR) MISC Inject 1 kit into the skin every 14 (fourteen) days.   Dextromethorphan-guaiFENesin (MUCINEX COUGH & CHEST CONGEST PO) Take 1 capsule by mouth as needed (Patient taking as needed).   DULoxetine  (CYMBALTA ) 60 MG  capsule Take 1 capsule (60 mg total) by mouth daily. (Patient taking differently: Take 120 mg by mouth at bedtime. Take two capsules daily)   esomeprazole  (NEXIUM ) 40 MG capsule TAKE 1 CAPSULE BY MOUTH TWICE DAILY BEFORE A MEAL   Fezolinetant  (VEOZAH ) 45 MG TABS Take 1 tablet (45 mg total) by mouth daily.   Fluticasone-Umeclidin-Vilant (TRELEGY ELLIPTA) 100-62.5-25 MCG/INH AEPB Inhale 1 puff into the lungs daily.   furosemide  (LASIX ) 20 MG tablet Take 1 tablet (20 mg total) by mouth daily.   glucose 4 GM chewable tablet Chew 1 tablet (4 g total) by mouth as needed for low blood sugar (< 70).   hydrOXYzine  (VISTARIL ) 25 MG capsule Take 1 capsule by mouth three times daily as needed   insulin  aspart (NOVOLOG ) 100 UNIT/ML injection Use sliding scale provided at time of discharge, administer 3 times daily with meals (Patient taking differently: Inject 15-48 Units into the skin See admin instructions. Inject 20-40 units subcutaneously 3 times daily with meals and inject 15-35 units subcutaneously at night if needed with nighttime snacking)   insulin  glargine, 2 Unit Dial, (TOUJEO  MAX SOLOSTAR) 300 UNIT/ML Solostar Pen Inject 80 Units into the skin 2 (two) times daily.   Insulin  Pen Needle (B-D UF III MINI PEN NEEDLES) 31G X 5 MM MISC Use to administer insulin  five times a day   levothyroxine  (SYNTHROID ) 175 MCG tablet TAKE 1 TABLET BY MOUTH ONCE DAILY BEFORE BREAKFAST LABS  REQUIRED  FOR  FUTURE  REFILLS   LINZESS  290 MCG CAPS capsule TAKE 1 CAPSULE BY MOUTH ONCE DAILY BEFORE BREAKFAST   methocarbamol  (ROBAXIN ) 500 MG tablet Take 1 tablet (500 mg total) by mouth every 6 (six) hours as needed for muscle spasms.   Misc Natural Products (COMPLETE MENOPAUSE HEALTH PO) Take by mouth. [place] complete menopause gummies   MOUNJARO 12.5 MG/0.5ML Pen Inject 12.5 mg into the skin once a week.   naproxen  (NAPROSYN ) 500 MG tablet Take 1 tablet by mouth twice daily   Nerve Stimulator (CLEVER CHOICE TENS UNIT)  DEVI Place 1 Dose onto the skin as needed (pain.). Tens 7000   NYAMYC  powder APPLY 1 APPLICATION TOPICALLY 2 TIMES DAILY AS NEEDED FOR RASH   OXYGEN  Inhale 3 L into the lungs continuous.   Polyethyl Glycol-Propyl Glycol (SYSTANE OP) Apply to eye.   primidone  (MYSOLINE ) 50 MG tablet Take 1 tablet (50 mg total) by mouth at bedtime.   Rhubarb (ESTROVEN COMPLETE PO) Take by mouth.   temazepam  (RESTORIL ) 15 MG capsule Take 15 mg by mouth at bedtime.   terbinafine  (LAMISIL ) 250 MG tablet Take 1 tablet by mouth once daily   No facility-administered encounter medications on file as of 03/22/2024.     Objective:    PHYSICAL EXAMINATION:    VITALS:   There were no vitals filed for this visit.    Cardiovascular: Regular rate  rhythm Lungs: Clear to auscultation bilaterally.  Wearing oxygen . Neck: No bruits  Orientation:  The patient is alert and oriented x 3.   Cranial nerves: There is good facial symmetry. Extraocular muscles are intact and visual fields are full to confrontational testing. Speech is fluent and clear. Soft palate rises symmetrically and there is no tongue deviation. Hearing is intact to conversational tone. Tone: Tone is good throughout. Sensation: Sensation is intact to light touch touch throughout Coordination:  The patient has no dysdiadichokinesia or dysmetria. Motor: Strength is at least antigravity x4. Gait and Station: Patient reports not ambulating much because of shortness of breath so deferred today   MOVEMENT EXAM: Tremor:  There is no rest tremor.  No postural tremor.  No intention tremor today. I have reviewed and interpreted the following labs independently   Chemistry      Component Value Date/Time   NA 132 (L) 04/21/2023 1145   NA 141 05/28/2022 1448   K 4.7 04/21/2023 1145   CL 92 (L) 04/21/2023 1145   CO2 33 (H) 04/21/2023 1145   BUN 15 04/21/2023 1145   BUN 13 05/28/2022 1448   CREATININE 0.62 04/21/2023 1145      Component Value Date/Time    CALCIUM  9.9 04/21/2023 1145   ALKPHOS 112 04/21/2023 1145   AST 21 04/21/2023 1145   ALT 27 04/21/2023 1145   BILITOT 0.3 04/21/2023 1145      Lab Results  Component Value Date   WBC 10.4 04/21/2023   HGB 12.4 04/21/2023   HCT 39.3 04/21/2023   MCV 82.3 04/21/2023   PLT 209.0 04/21/2023   Lab Results  Component Value Date   TSH 1.81 04/21/2023     Chemistry      Component Value Date/Time   NA 132 (L) 04/21/2023 1145   NA 141 05/28/2022 1448   K 4.7 04/21/2023 1145   CL 92 (L) 04/21/2023 1145   CO2 33 (H) 04/21/2023 1145   BUN 15 04/21/2023 1145   BUN 13 05/28/2022 1448   CREATININE 0.62 04/21/2023 1145      Component Value Date/Time   CALCIUM  9.9 04/21/2023 1145   ALKPHOS 112 04/21/2023 1145   AST 21 04/21/2023 1145   ALT 27 04/21/2023 1145   BILITOT 0.3 04/21/2023 1145      Total time spent on today's visit was 20 minutes, including both face-to-face time and nonface-to-face time.  Time included that spent on review of records (prior notes available to me/labs/imaging if pertinent), discussing treatment and goals, answering patient's questions and coordinating care.    Cc:  Donley Furth, MD

## 2024-03-22 ENCOUNTER — Encounter: Payer: Self-pay | Admitting: Neurology

## 2024-03-22 ENCOUNTER — Ambulatory Visit (INDEPENDENT_AMBULATORY_CARE_PROVIDER_SITE_OTHER): Payer: PPO | Admitting: Neurology

## 2024-03-22 VITALS — BP 130/86 | HR 95 | Resp 20 | Wt 258.0 lb

## 2024-03-22 DIAGNOSIS — R413 Other amnesia: Secondary | ICD-10-CM

## 2024-03-22 DIAGNOSIS — G250 Essential tremor: Secondary | ICD-10-CM

## 2024-03-22 MED ORDER — PRIMIDONE 50 MG PO TABS: 50.0000 mg | ORAL_TABLET | Freq: Every day | ORAL | 3 refills | Status: DC

## 2024-03-22 NOTE — Patient Instructions (Signed)
 You have been referred for a neurocognitive evaluation (i.e., evaluation of memory and thinking abilities). Please bring someone with you to this appointment if possible, as it is helpful for the neuropsychologist to hear from both you and another adult who knows you well. Please bring eyeglasses and hearing aids if you wear them and take any medications as you normally would. Please fully abstain from all alcohol, marijuana, or other substances prior to your appointment.   The evaluation will take approximately 2-3 hours and has two parts:   The first part is a clinical interview with the neuropsychologist, Dr. Milbert Coulter or Dr. Robbie Lis. During the interview, the neuropsychologist will speak with you and the individual you brought to the appointment.    The second part of the evaluation is testing with the doctor's technician, aka psychometrician, Annabelle Harman or [employer]. During the testing, the technician will ask you to remember different types of material, solve problems, and answer some questionnaires. Your family member will not be present for this portion of the evaluation.   Please note: We have to reserve several hours of the neuropsychologist's time and the psychometrician's time for your evaluation appointment. As such, there is a No-Show fee of $100. If you are unable to attend any of your appointments, please contact our office as soon as possible to reschedule.

## 2024-03-24 DIAGNOSIS — E1140 Type 2 diabetes mellitus with diabetic neuropathy, unspecified: Secondary | ICD-10-CM | POA: Diagnosis not present

## 2024-03-24 DIAGNOSIS — Z6841 Body Mass Index (BMI) 40.0 and over, adult: Secondary | ICD-10-CM | POA: Diagnosis not present

## 2024-03-24 DIAGNOSIS — E1165 Type 2 diabetes mellitus with hyperglycemia: Secondary | ICD-10-CM | POA: Diagnosis not present

## 2024-03-24 DIAGNOSIS — Z794 Long term (current) use of insulin: Secondary | ICD-10-CM | POA: Diagnosis not present

## 2024-04-07 DIAGNOSIS — F411 Generalized anxiety disorder: Secondary | ICD-10-CM | POA: Diagnosis not present

## 2024-04-07 DIAGNOSIS — F330 Major depressive disorder, recurrent, mild: Secondary | ICD-10-CM | POA: Diagnosis not present

## 2024-04-07 DIAGNOSIS — F5101 Primary insomnia: Secondary | ICD-10-CM | POA: Diagnosis not present

## 2024-04-12 ENCOUNTER — Other Ambulatory Visit: Payer: Self-pay | Admitting: Gastroenterology

## 2024-05-07 ENCOUNTER — Other Ambulatory Visit: Payer: Self-pay | Admitting: Gastroenterology

## 2024-05-17 ENCOUNTER — Encounter: Payer: Self-pay | Admitting: Psychology

## 2024-05-17 DIAGNOSIS — G250 Essential tremor: Secondary | ICD-10-CM | POA: Insufficient documentation

## 2024-05-18 ENCOUNTER — Ambulatory Visit: Admitting: Psychology

## 2024-05-18 ENCOUNTER — Encounter: Payer: Self-pay | Admitting: Psychology

## 2024-05-18 DIAGNOSIS — R4189 Other symptoms and signs involving cognitive functions and awareness: Secondary | ICD-10-CM

## 2024-05-18 NOTE — Progress Notes (Signed)
   Psychometrician Note   Cognitive testing was administered to Bonnie Gonzalez by Arnulfo Larch, B.S. (psychometrist) under the supervision of Dr. Melville Stade, Ph.D., ABPP, licensed psychologist on 05/18/2024. Bonnie Gonzalez did not appear overtly distressed by the testing session per behavioral observation or responses across self-report questionnaires. Rest breaks were offered.    The battery of tests administered was selected by Dr. Zachary C. Merz, Ph.D., ABPP with consideration to Bonnie Gonzalez's current level of functioning, the nature of her symptoms, emotional and behavioral responses during interview, level of literacy, observed level of motivation/effort, and the nature of the referral question. This battery was communicated to the psychometrist. Communication between Dr. Melville Stade, Ph.D., ABPP and the psychometrist was ongoing throughout the evaluation and Dr. Melville Stade, Ph.D., ABPP was immediately accessible at all times. Dr. Zachary C. Merz, Ph.D., ABPP provided supervision to the psychometrist on the date of this service to the extent necessary to assure the quality of all services provided.    Bonnie Gonzalez will return within approximately 1-2 weeks for an interactive feedback session with Dr. Kitty Perkins at which time her test performances, clinical impressions, and treatment recommendations will be reviewed in detail. Bonnie Gonzalez understands she can contact our office should she require our assistance before this time.  A total of 145 minutes of billable time were spent face-to-face with Bonnie Gonzalez by the psychometrist. This includes both test administration and scoring time. Billing for these services is reflected in the clinical report generated by Dr. Melville Stade, Ph.D., ABPP  This note reflects time spent with the psychometrician and does not include test scores or any clinical interpretations made by Dr. Kitty Perkins. The full report will follow in a separate note.

## 2024-05-18 NOTE — Progress Notes (Signed)
 NEUROPSYCHOLOGICAL EVALUATION [place] Department of Neurology  Date of Evaluation: [place] 18, 2025  Reason for Referral:   Bonnie Gonzalez is a 68 y.o. right-handed Caucasian female referred by Fran Imus, D.O., to characterize her current cognitive functioning and assist with diagnostic clarity and treatment planning in the context of subjective cognitive decline and tremor.   Assessment and Plan:   Clinical Impression(s): Ms. Sako pattern of performance is suggestive of neuropsychological functioning within normal limits. While quite subtle, the argument could be made for a relative weakness across processing speed. However, normatively performances were no lower than the below average range and certainly not suggestive of ongoing impairment. Overall, performances were appropriate relative to age-matched peers across all domains. This includes processing speed, attention/concentration, executive functioning, safety/judgment, receptive and expressive language, visuospatial abilities, and all aspects of learning and memory. Ms. Kreher denied difficulties completing instrumental activities of daily living (ADLs) independently. She does not warrant consideration for a neurocognitive disorder at the present time.   Relative to her previous evaluation in August 2022, current performances reflect stability. No cognitive domains exhibited appreciable cognitive decline.  It remains likely that subjective cognitive dysfunction is due to a combination of psychiatric distress and various psychosocial stressors, fatigue/sleep disturbances, chronic medical conditions, and polypharmacy, superimposed upon the normal aging process. Specific to memory, Ms. Mikel was able to learn novel verbal and visual information efficiently and retain this knowledge after lengthy delays. Overall, memory performance combined with intact performances across other areas of cognitive  functioning is not suggestive of presently symptomatic Alzheimer's disease. Likewise, her cognitive and behavioral profile is not suggestive of any other form of neurodegenerative illness presently.  Recommendations: Should Ms. Shuart report cognitive or functional decline in the future, a repeat evaluation would be warranted at that time.  A combination of medication and psychotherapy has been shown to be most effective at treating symptoms of anxiety and depression. As such, Ms. Sanks is encouraged to speak with her prescribing physician regarding medication adjustments to optimally manage these symptoms. Likewise, Ms. Bells is encouraged to consider engaging in short-term psychotherapy to address symptoms of psychiatric distress. She would benefit from an active and collaborative therapeutic environment, rather than one purely supportive in nature. Recommended treatment modalities include Cognitive Behavioral Therapy (CBT) or Acceptance and Commitment Therapy (ACT).  Ms. Evans is encouraged to attend to lifestyle factors for brain health (e.g., regular physical exercise, good nutrition habits and consideration of the MIND-DASH diet, regular participation in cognitively-stimulating activities, and general stress management techniques), which are likely to have benefits for both emotional adjustment and cognition. In fact, in addition to promoting good general health, regular exercise incorporating aerobic activities (e.g., brisk walking, jogging, cycling, etc.) has been demonstrated to be a very effective treatment for depression and stress, with similar efficacy rates to both antidepressant medication and psychotherapy. Optimal control of vascular risk factors (including safe cardiovascular exercise and adherence to dietary recommendations) is encouraged. Likewise, continued compliance with her CPAP machine will also be important. Continued participation in activities which provide mental stimulation  and social interaction is also recommended.   If interested, there are some activities which have therapeutic value and can be useful in keeping her cognitively stimulated. For suggestions, Ms. Derhammer is encouraged to go to the following website: https://www.[facility].org/get-to-know-barrow/centers-programs/neurorehabilitation-center/neuro-rehab-apps-and-games/ which has options, categorized by level of difficulty. It should be noted that these activities should not be viewed as a substitute for therapy.  Memory can be improved using internal  strategies such as rehearsal, repetition, chunking, mnemonics, association, and imagery. External strategies such as written notes in a consistently used memory journal, visual and nonverbal auditory cues such as a calendar on the refrigerator or appointments with alarm, such as on a cell phone, can also help maximize recall.    To address problems with processing speed, she may wish to consider:   -Ensuring that she is alerted when essential material or instructions are being presented   -Adjusting the speed at which new information is presented   -Allowing for more time in comprehending, processing, and responding in conversation   -Repeating and paraphrasing instructions or conversations aloud  To address problems with fluctuating attention and/or executive dysfunction, she may wish to consider:   -Avoiding external distractions when needing to concentrate   -Limiting exposure to fast paced environments with multiple sensory demands   -Writing down complicated information and using checklists   -Attempting and completing one task at a time (i.e., no multi-tasking)   -Verbalizing aloud each step of a task to maintain focus   -Taking frequent breaks during the completion of steps/tasks to avoid fatigue   -Reducing the amount of information considered at one time   -Scheduling more difficult activities for a time of day where she is usually most  alert  Review of Records:   Ms. Cullimore completed a comprehensive neuropsychological evaluation with myself on 07/08/2021. Results suggested neuropsychological functioning within normal limits relative to age-matched peers. No patterns of weakness emerged as performance across all assessed cognitive domains was appropriate. This included processing speed, attention/concentration, executive functioning, safety/judgment, receptive and expressive language, visuospatial abilities, and learning and memory. Day-to-day subjective memory concerns were likely a combination of significant psychiatric distress, sleep disruption and fatigue, pain and other medical ailments, and polypharmacy.   Past Medical History:  Diagnosis Date   Acquired hypothyroidism 10/01/2010   Allergic rhinitis 07/20/2009   Arthritis    AVM (arteriovenous malformation) of colon, acquired with hemorrhage    Back pain 10/18/2015   Bilateral carpal tunnel syndrome 07/20/2023   Bilateral leg edema 03/31/2018   Centrilobular emphysema 02/04/2019   HRCT 09/15/18; 04/09/23   Chronic diastolic CHF (congestive heart failure) 01/28/2019   TTE 05/25/2023 notes mild diastolic dysfunction, grade 1B elevated left ventricular pressure     Chronic respiratory failure with hypoxia 02/04/2019   6 Min Walk 08/31/18 at [facility]   Chronic urticaria 12/21/2019   COPD (chronic obstructive pulmonary disease) 09/01/2018   Quit smoking 2010  - Spirometry 08/31/2018  FEV1 1.1 (51%)  Ratio 66 s prior rx with typical curvature  - 08/31/2018  After extensive coaching inhaler device,  effectiveness =    90% with elipta > try anoro sample > no better so d/c'  - PFT's  11/10/2018  FEV1 1.24 (59 % ) ratio 75 (ratio with fev1/VC =  63)  p no % improvement from saba p nothing prior to study with DLCO  105  % corrects to 132  %    DOE (dyspnea on exertion) 08/31/2018   Onset 05/2018 with background of unexplained leg swelling x 01/2018   -echo 04/13/18 just G1    diastolic dysfunction s PH   -  08/31/2018   Walked RA x one lap @ 185 stopped due to  Sob/ sats 89% at nl pace  - Spirometry 08/31/2018  FEV1 1.1 (51%)  Ratio 66 s prior rx  - 09/28/2018 could not do full pfts 09/28/2018  - Collagen vasc profile 09/28/18  For ESR =  54 (was 102 before prednisone )  Neg collag   Dyshidrotic eczema 12/13/2010   Epigastric pain 11/03/2007   Esophageal reflux 11/03/2007   Essential hypertension 01/26/2019   Essential tremor    Generalized anxiety disorder with panic attacks 01/20/2018   Headache 11/03/2007   Hiatal hernia 04/10/2023   Noted on chest CT   History of COVID-19 11/21/2021   Hot flashes 10/28/2023   Irritable bowel syndrome 07/13/2009   Major depressive disorder 07/17/2020   Mixed hyperlipidemia 07/20/2009   Obesity    OSA (obstructive sleep apnea) 07/26/2015   HST 08/2015 mild OSA, AHI 8/hour, lowest desaturation 81%, desaturations noted without respiratory events  Formatting of this note might be different from the original. PSG 05/20/19 AHI 25.1   Osteopenia of multiple sites 06/01/2019   DEXA 05/24/2019: Right femur neck T- 2.2, left femur neck T- 1.5, right total femur T -0.5, left total femur T -0.5, AP total spine T- 1.9. FRAX 10-year probability of major osteoporotic fracture is 8.8% and a hip fracture is 1.2%  DEXA 05/05/2019: Right femur neck T- 1.9, left femur neck T- 2.0, right total femur T -1.3, left total femur   Oxygen  deficiency    Polyp of cecum    Polyp of sigmoid colon    Polyp of transverse colon    Positive colorectal cancer screening using Cologuard test    Postinflammatory pulmonary fibrosis 09/15/2018   ESR    10/161/9  = 102   > rx short term prednisone  ? slt improvement in symptoms  HRCT 09/15/2018   1. Very mild basilar subpleural ground-glass, reticulation and traction bronchiolectasis, findings which may be minimally progressive from 06/29/2017 and are indicative of interstitial lung disease such as nonspecific interstitial  pneumonitis. Findings are indeterminate for UIP per consensus gu   Psychophysiological insomnia 03/29/2019   Type 2 diabetes mellitus 02/09/2017   UTI (urinary tract infection)    Vertigo 03/29/2014   Vitamin D  deficiency 09/15/2020    Past Surgical History:  Procedure Laterality Date   ANKLE FRACTURE SURGERY Right    benign tumor      removed from groin   COLONOSCOPY WITH PROPOFOL  N/A 05/15/2022   per Dr. Leonia Raman, adenomatous polyps, repeat in 5 yrs   EYE SURGERY     HOT HEMOSTASIS N/A 05/15/2022   Procedure: HOT HEMOSTASIS (ARGON PLASMA COAGULATION/BICAP);  Surgeon: Nandigam, Kavitha V, MD;  Location: [facility];  Service: Gastroenterology;  Laterality: N/A;   POLYPECTOMY  05/15/2022   Procedure: POLYPECTOMY;  Surgeon: Sergio Dandy, MD;  Location: [facility];  Service: Gastroenterology;;   WRIST ARTHROCENTESIS N/A 2001    Current Outpatient Medications:    albuterol  (PROVENTIL  HFA;VENTOLIN  HFA) 108 (90 Base) MCG/ACT inhaler, Inhale 2 puffs into the lungs every 4 (four) hours as needed for wheezing or shortness of breath., Disp: 1 Inhaler, Rfl: 11   ALPRAZolam  (XANAX ) 0.5 MG tablet, TAKE 1 TABLET (0.5 MG TOTAL) BY MOUTH 2 (TWO) TIMES DAILY AS NEEDED. (Patient taking differently: Take 0.25-0.5 mg by mouth 2 (two) times daily as needed for anxiety or sleep.), Disp: 60 tablet, Rfl: 5   atorvastatin  (LIPITOR) 80 MG tablet, Take 1 tablet (80 mg total) by mouth daily., Disp: 90 tablet, Rfl: 3   azelastine  (ASTELIN ) 0.1 % nasal spray, Place 1 spray into both nostrils 2 (two) times daily as needed for allergies., Disp: 30 mL, Rfl: 1   benzonatate  (TESSALON ) 200 MG capsule, TAKE 1 CAPSULE BY MOUTH EVERY 8 HOURS AS NEEDED  FOR COUGH (Patient not taking: Reported on 03/22/2024), Disp: 60 capsule, Rfl: 1   buPROPion ER (WELLBUTRIN SR) 100 MG 12 hr tablet, Take 100 mg by mouth daily., Disp: , Rfl:    cetirizine  (ZYRTEC ) 10 MG tablet, Take 1 tablet (10 mg total) by mouth daily., Disp: 90  tablet, Rfl: 3   Cholecalciferol (VITAMIN D3) 125 MCG (5000 UT) CAPS, Take 1 capsule (5,000 Units total) by mouth daily., Disp: 12 capsule, Rfl: 3   Cholecalciferol 125 MCG (5000 UT) TABS, Take 5,000 Units by mouth in the morning., Disp: , Rfl:    Continuous Glucose Sensor (FREESTYLE LIBRE 2 SENSOR) MISC, Inject 1 kit into the skin every 14 (fourteen) days., Disp: , Rfl:    Dextromethorphan-guaiFENesin (MUCINEX COUGH & CHEST CONGEST PO), Take 1 capsule by mouth as needed (Patient taking as needed)., Disp: , Rfl:    DULoxetine  (CYMBALTA ) 60 MG capsule, Take 1 capsule (60 mg total) by mouth daily. (Patient taking differently: Take 120 mg by mouth at bedtime. Take two capsules daily), Disp: 90 capsule, Rfl: 3   esomeprazole  (NEXIUM ) 40 MG capsule, TAKE 1 CAPSULE BY MOUTH TWICE DAILY BEFORE A MEAL, Disp: 180 capsule, Rfl: 0   Fezolinetant  (VEOZAH ) 45 MG TABS, Take 1 tablet (45 mg total) by mouth daily., Disp: 30 tablet, Rfl: 11   Fluticasone-Umeclidin-Vilant (TRELEGY ELLIPTA) 100-62.5-25 MCG/INH AEPB, Inhale 1 puff into the lungs daily., Disp: , Rfl:    furosemide  (LASIX ) 20 MG tablet, Take 1 tablet (20 mg total) by mouth daily., Disp: 90 tablet, Rfl: 3   glucose 4 GM chewable tablet, Chew 1 tablet (4 g total) by mouth as needed for low blood sugar (< 70)., Disp: 50 tablet, Rfl: 0   hydrOXYzine  (VISTARIL ) 25 MG capsule, Take 1 capsule by mouth three times daily as needed, Disp: 90 capsule, Rfl: 0   insulin  aspart (NOVOLOG ) 100 UNIT/ML injection, Use sliding scale provided at time of discharge, administer 3 times daily with meals (Patient taking differently: Inject 15-48 Units into the skin See admin instructions. Inject 20-40 units subcutaneously 3 times daily with meals and inject 15-35 units subcutaneously at night if needed with nighttime snacking), Disp: 10 mL, Rfl: 0   insulin  glargine, 2 Unit Dial, (TOUJEO  MAX SOLOSTAR) 300 UNIT/ML Solostar Pen, Inject 80 Units into the skin 2 (two) times daily.,  Disp: , Rfl:    Insulin  Pen Needle (B-D UF III MINI PEN NEEDLES) 31G X 5 MM MISC, Use to administer insulin  five times a day, Disp: , Rfl:    levothyroxine  (SYNTHROID ) 175 MCG tablet, TAKE 1 TABLET BY MOUTH ONCE DAILY BEFORE BREAKFAST LABS  REQUIRED  FOR  FUTURE  REFILLS, Disp: 90 tablet, Rfl: 0   LINZESS  290 MCG CAPS capsule, TAKE 1 CAPSULE BY MOUTH ONCE DAILY BEFORE BREAKFAST, Disp: 30 capsule, Rfl: 0   methocarbamol  (ROBAXIN ) 500 MG tablet, Take 1 tablet (500 mg total) by mouth every 6 (six) hours as needed for muscle spasms. (Patient not taking: Reported on 03/22/2024), Disp: 120 tablet, Rfl: 5   Misc Natural Products (COMPLETE MENOPAUSE HEALTH PO), Take by mouth. [place] complete menopause gummies, Disp: , Rfl:    MOUNJARO 12.5 MG/0.5ML Pen, Inject 12.5 mg into the skin once a week., Disp: , Rfl:    naproxen  (NAPROSYN ) 500 MG tablet, Take 1 tablet by mouth twice daily, Disp: 60 tablet, Rfl: 5   Nerve Stimulator (CLEVER CHOICE TENS UNIT) DEVI, Place 1 Dose onto the skin as needed (pain.). Tens 7000, Disp: ,  Rfl:    NYAMYC  powder, APPLY 1 APPLICATION TOPICALLY 2 TIMES DAILY AS NEEDED FOR RASH, Disp: 15 g, Rfl: 0   OXYGEN , Inhale 3 L into the lungs continuous., Disp: , Rfl:    Polyethyl Glycol-Propyl Glycol (SYSTANE OP), Apply to eye., Disp: , Rfl:    primidone  (MYSOLINE ) 50 MG tablet, Take 1 tablet (50 mg total) by mouth at bedtime., Disp: 90 tablet, Rfl: 3   Rhubarb (ESTROVEN COMPLETE PO), Take by mouth. (Patient not taking: Reported on 03/22/2024), Disp: , Rfl:    temazepam  (RESTORIL ) 15 MG capsule, Take 15 mg by mouth at bedtime., Disp: , Rfl:    terbinafine  (LAMISIL ) 250 MG tablet, Take 1 tablet by mouth once daily (Patient not taking: Reported on 03/22/2024), Disp: 90 tablet, Rfl: 0  Neuroimaging: Brain MRI on 01/10/2014 revealed minimal small vessel ischemic changes. No other neuroimaging was available for review.   Clinical Interview:   The following information was obtained during  a clinical interview with Ms. Remmert and her daughter prior to cognitive testing.  Cognitive Symptoms: Decreased short-term memory: Endorsed. Previously, Ms. Lemmons described symptoms surrounding trouble recalling the details of previous conversations, as well as names of familiar individuals. She also reported trouble misplacing/losing things in her environment. Her and her daughter reported memory concerns being present for the past several years (records suggest mid 2014) and that they have progressively worsened over time. This was stable. Ms. Dembeck and her daughter noted the potential for mild memory decline relative to her previous August 2022 evaluation.  Decreased long-term memory: Denied. Decreased attention/concentration: Endorsed. She previously reported mild difficulties with sustained attention and somewhat more pronounced trouble with increased distractibility. This was stable. Currently, she added trouble reading due to an inability to focus.  Reduced processing speed: Endorsed. She previously highlighted it taking her far longer to accomplish various tasks currently. This was stable.  Difficulties with executive functions: Endorsed. She previously reported feeling that she had been less organized lately. Her daughter also previously reported longstanding personality traits surrounding indecision. This was stable. However, she did add current concerns surrounding multi-tasking. Difficulties with emotion regulation: Denied. No significant personality changes were noted. Her daughter did state that Ms. Kalama may have a more negative world view or be more easily irritated lately.  Difficulties with receptive language: Denied. Difficulties with word finding: Endorsed. Difficulties have have mildly worsened relative to her previous evaluation.  Decreased visuoperceptual ability: Denied.   Difficulties completing ADLs: Largely denied. She previously described occasional issues forgetting to  take her insulin  or questioning herself if she had already taken it. This was stable. She reported financial and bill paying independence and no current driving concerns.  Additional Medical History: History of traumatic brain injury/concussion: Denied. History of stroke: Denied. History of seizure activity: Denied. History of known exposure to toxins: Denied. Symptoms of chronic pain: Endorsed. She reported fairly diffuse chronic pain, with particular areas of emphasis including her back, shoulder, hips, and knees. Pain symptoms were said to impact balance to some extent.  Experience of frequent headaches/migraines: Denied.   Balance/coordination difficulties: Endorsed. She previously reported weakness in her lower extremities. She ambulated with a rolling walker during the previous and current appointment. She was unclear if one side of the body seemed worse than the other. Fatigue presents a significant barrier to balance and ambulation. She reported a few falls over the past couple of years with the most recent being about one month prior to her 2022 evaluation. No more recent falls  were reported.   Other motor difficulties: Endorsed. Mild tremors were said to affect her head and her upper extremities bilaterally. These have been minimized with medication. Per medical records, tremors are believed to be caused by medication side-effects. However, there may also be an essential tremor component.   Sleep History: Estimated hours obtained each night: 5-6 hours.  Difficulties falling asleep: Endorsed. She previously reported difficulties due to having an over-active mind preventing her from falling asleep. She was taking sleep aids at the time of her previous evaluation (including a nighttime Xanax ) to help with this. This was stable.  Difficulties staying asleep: Endorsed. She reported waking frequently to use the restroom throughout the night. Feels rested and refreshed upon awakening: Denied. She  previously reported waking with significant fatigue, stating that she has fallen asleep on the toilet after awakening some mornings. She noted that she may wake up, eat breakfast and have coffee, but then fall back asleep for several hours. This was stable.   History of snoring: Endorsed. History of waking up gasping for air: Endorsed. Witnessed breath cessation while asleep: Endorsed. She has a history of obstructive sleep apnea and continues to use her CPAP machine nightly.   History of vivid dreaming: Denied. Excessive movement while asleep: Denied. Her daughter noted that she will talk in her sleep, which is longstanding in nature.  Instances of acting out her dreams: Denied.  Psychiatric/Behavioral Health History: Depression: Ms. Rinck reported a longstanding history of significant depressive symptoms. She is followed by a psychiatrist and has worked with an Building services engineer in the past with some success. She reported that current medications were said to be helpful at managing her symptoms. She described her current mood as pretty good, a little bit better. Current or remote suicidal ideation, intent, or plan was denied.  Anxiety: She also acknowledged a longstanding history of anxiety symptoms, particularly heightened in social settings, with prior panic attacks. Current medications (including Xanax ) were said to be helpful at managing her symptoms.  Mania: Denied. Trauma History: Denied. Visual/auditory hallucinations: Denied. Delusional thoughts: Denied.   Tobacco: Denied. Alcohol: She denied current alcohol consumption as well as a history of problematic alcohol abuse or dependence.  Recreational drugs: Denied.  Family History: Problem Relation Age of Onset   Arthritis/Rheumatoid Mother    Alzheimer's disease Father    Diabetes Mellitus II Father    Dementia Father    Diabetes Mellitus II Sister    Dementia Brother    Diabetes Mellitus II Brother    Asthma Other         fhx   Depression Other        fhx   Parkinsonism Other        fhx   Lupus Other        fhx   This information was confirmed by Ms. Willey Harrier.  Academic/Vocational History: Highest level of educational attainment: 13 years. She graduated from high school and reported completing 1.5 years of college. She described herself as a fairly good (A/B) student in academic settings. Math was noted as a likely relative weakness.  History of developmental delay: Denied. History of grade repetition: Denied. Enrollment in special education courses: Denied. History of LD/ADHD: Denied.   Employment: Retired. 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   Evaluation Results:   Behavioral Observations: Ms. Fiorenza was accompanied by her daughter, arrived to her appointment on time, and was appropriately dressed and groomed. She appeared alert. She ambulated with the assistance  of a rolling walker and maneuvered this device well. Gross motor functioning appeared intact upon informal observation and no abnormal movements (e.g., tremors) were noted. Her affect was generally relaxed and positive. Spontaneous speech was fluent and word finding difficulties were not observed during the clinical interview. Thought processes were coherent, organized, and normal in content. Insight into her cognitive difficulties appeared adequate.   During testing, sustained attention was appropriate. Task engagement was adequate and she persisted when challenged. Overall, Ms. Beechy was cooperative with the clinical interview and subsequent testing procedures.   Adequacy of Effort: The validity of neuropsychological testing is limited by the extent to which the individual being tested may be assumed to have exerted adequate effort during testing. Ms. Kueker expressed her intention to perform to the best of her abilities and exhibited adequate task engagement and persistence. Scores across stand-alone  and embedded performance validity measures were within expectation. As such, the results of the current evaluation are believed to be a valid representation of Ms. Severa's current cognitive functioning.  Test Results: Ms. Monaco was oriented at the time of the current evaluation. She was one day off when stating the current date and day of the week.  Intellectual abilities based upon educational and vocational attainment were estimated to be in the average range. Premorbid abilities were estimated to be within the average range based upon a single-word reading test.   Processing speed was below average to average. Basic attention was above average. More complex attention (e.g., working memory) was average. Executive functioning was average. She performed in the well above average normative range across a task assessing safety and judgment.   Assessed receptive language abilities were above average. Likewise, Ms. Donavan did not exhibit any difficulties comprehending task instructions and answered all questions asked of her appropriately. Assessed expressive language (e.g., verbal fluency and confrontation naming) was average to exceptionally high.     Assessed visuospatial/visuoconstructional abilities were average to exceptionally high.    Learning (i.e., encoding) of novel verbal and visual information was average to well above average normative ranges. Spontaneous delayed recall (i.e., retrieval) of previously learned information was average to above average. Retention rates were 88% across a list learning task, 81% across a story learning task, 94% across a list learning task, and 86% across a shape learning task. Performance across recognition tasks was average to above average, suggesting evidence for information consolidation.   Results of emotional screening instruments suggested that recent symptoms of generalized anxiety were in the minimal range, while symptoms of depression were within the  moderate range. A screening instrument assessing recent sleep quality suggested the presence of moderate sleep dysfunction.  Table of Scores:   Note: This summary of test scores accompanies the interpretive report and should not be considered in isolation without reference to the appropriate sections in the text. Descriptors are based on appropriate normative data and may be adjusted based on clinical judgment. Terms such as Within Normal Limits and Outside Normal Limits are used when a more specific description of the test score cannot be determined. Descriptors refer to the current evaluation only.        Percentile - Normative Descriptor > 98 - Exceptionally High 91-97 - Well Above Average 75-90 - Above Average 25-74 - Average 9-24 - Below Average 2-8 - Well Below Average < 2 - Exceptionally Low        Validity: August 2022 Current  DESCRIPTOR        DCT: --- --- --- Within Normal  Limits  NAB EVI: --- --- --- Within Normal Limits        Orientation:       Raw Score Raw Score Percentile   NAB Orientation, Form 1 29/29 27/29 --- ---        Cognitive Screening:       Raw Score Raw Score Percentile   SLUMS: 25/30 29/30 --- ---        Intellectual Functioning:       Standard Score Standard Score Percentile   Test of Premorbid Functioning: 109 109 73 Average        Memory:      NAB Memory Module, Form 1: Standard Score/ T Score Standard Score/ T Score Percentile   Total Memory Index 116 107 68 Average  List Learning        Total Trials 1-3 25/36 (54) 21/36 (45) 31 Average    List B 5/12 (54) 7/12 (63) 91 Well Above Average    Short Delay Free Recall 8/12 (53) 8/12 (53) 62 Average    Long Delay Free Recall 9/12 (57) 7/12 (49) 46 Average    Retention Percentage 113 (56) 88 (47) 38 Average    Recognition Discriminability 10 (57) 8 (52) 58 Average  Shape Learning        Total Trials 1-3 20/27 (62) 19/27 (61) 86 Above Average    Delayed Recall 9/9 (69) 6/9 (53) 62 Average     Retention Percentage 100 (51) 86 (46) 34 Average    Recognition Discriminability 7 (52) 9 (62) 88 Above Average  Story Learning        Immediate Recall 58/80 (45) 63/80 (50) 50 Average    Delayed Recall 34/40 (52) 30/40 (45) 31 Average    Retention Percentage 100 (55) 81 (46) 34 Average  Daily Living Memory        Immediate Recall 48/51 (64) 49/51 (68) 96 Well Above Average    Delayed Recall 16/17 (61) 16/17 (61) 86 Above Average    Retention Percentage 94 (55) 94 (55) 69 Average    Recognition Hits 9/10 (52) 10/10 (60) 84 Above Average        Attention/Executive Function:      Trail Making Test (TMT): Raw Score (T Score) Raw Score (T Score) Percentile     Part A 43 secs.,  0 errors (42) 41 secs.,  0 errors (42) 21 Below Average    Part B 86 secs.,  0 errors (49) 94 secs.,  0 errors (44) 27 Average          Scaled Score Scaled Score Percentile   WAIS-IV Coding: 8 7 16  Below Average        NAB Attention Module, Form 1: T Score T Score Percentile     Digits Forward 53 61 86 Above Average    Digits Backwards 49 53 62 Average         Scaled Score Scaled Score Percentile   WAIS-IV Similarities: 9 9 37 Average        D-KEFS Color-Word Interference Test: Raw Score (Scaled Score) Raw Score (Scaled Score) Percentile     Color Naming 31 secs. (11) 37 secs. (8) 25 Average    Word Reading 23 secs. (11) 24 secs. (10) 50 Average    Inhibition 76 secs. (9) 80 secs. (8) 25 Average      Total Errors 1 error (11) 4 errors (8) 25 Average    Inhibition/Switching 91 secs. (8) 78 secs. (10) 50 Average  Total Errors 3 errors (10) 2 errors (11) 63 Average        D-KEFS Verbal Fluency Test: Raw Score (Scaled Score) Raw Score (Scaled Score) Percentile     Letter Total Correct --- 38 (11) 63 Average    Category Total Correct --- 52 (17) 99 Exceptionally High    Category Switching Total Correct --- 13 (11) 63 Average    Category Switching Accuracy --- 11 (10) 50 Average      Total Set Loss  Errors --- 2 (10) 50 Average      Total Repetition Errors --- 0 (13) 84 Above Average        NAB Executive Functions Module, Form 1: T Score T Score Percentile     Judgment 68 68 96 Well Above Average        Language:      Verbal Fluency Test: Raw Score (T Score) Raw Score (T Score) Percentile     Phonemic Fluency (FAS) 37 (45) 38 (48) 42 Average    Animal Fluency 17 (46) 29 (68) 96 Well Above Average         NAB Language Module, Form 1: T Score T Score Percentile     Auditory Comprehension 58 57 75 Above Average    Naming 31/31 (57) 31/31 (57) 75 Above Average        Visuospatial/Visuoconstruction:       Raw Score Raw Score Percentile   Clock Drawing: 10/10 10/10 --- Within Normal Limits        NAB Spatial Module, Form 1: T Score T Score Percentile     Figure Drawing Copy 55 72 99 Exceptionally High         Scaled Score Scaled Score Percentile   WAIS-IV Block Design: 10 10 50 Average        Mood and Personality:       Raw Score Raw Score Percentile   Beck Depression Inventory - II: 29 20 --- Moderate  PROMIS Anxiety Questionnaire: 24 12 --- None to Slight        Additional Questionnaires:       Raw Score Raw Score Percentile   PROMIS Sleep Disturbance Questionnaire: 29 30 --- Moderate   Informed Consent and Coding/Compliance:   The current evaluation represents a clinical evaluation for the purposes previously outlined by the referral source and is in no way reflective of a forensic evaluation.   Ms. Ingle was provided with a verbal description of the nature and purpose of the present neuropsychological evaluation. Also reviewed were the foreseeable risks and/or discomforts and benefits of the procedure, limits of confidentiality, and mandatory reporting requirements of this provider. The patient was given the opportunity to ask questions and receive answers about the evaluation. Oral consent to participate was provided by the patient.   This evaluation was conducted by  Melville Stade, Ph.D., ABPP-CN, board certified clinical neuropsychologist. Ms. Heiney completed a clinical interview with Dr. Kitty Perkins, billed as one unit [phone], and 145 minutes of cognitive testing and scoring, billed as one unit [phone] and four additional units 96139. Psychometrist Arnulfo Larch, B.S. assisted Dr. Kitty Perkins with test administration and scoring procedures. As a separate and discrete service, one unit [phone] and two units 96133 (160 minutes) were billed for Dr. Arsenio Larger time spent in interpretation and report writing.

## 2024-05-25 ENCOUNTER — Ambulatory Visit: Admitting: Psychology

## 2024-05-25 DIAGNOSIS — R4189 Other symptoms and signs involving cognitive functions and awareness: Secondary | ICD-10-CM | POA: Diagnosis not present

## 2024-05-25 NOTE — Progress Notes (Signed)
   Neuropsychology Feedback Session Bonnie Gonzalez [facility] Department of Neurology  Reason for Referral:   Bonnie Gonzalez is a 68 y.o. right-handed Caucasian female referred by Asberry Schneider, D.O., to characterize her current cognitive functioning and assist with diagnostic clarity and treatment planning in the context of subjective cognitive decline and tremor.   Feedback:   [name] completed a comprehensive neuropsychological evaluation on 05/18/2024. Please refer to that encounter for the full report and recommendations. Briefly, results suggested neuropsychological functioning within normal limits. While quite subtle, the argument could be made for a relative weakness across processing speed. However, normatively performances were no lower than the below average range and certainly not suggestive of ongoing impairment. Overall, performances were appropriate relative to age-matched peers across all domains. Relative to her previous evaluation in August 2022, current performances reflect stability. No cognitive domains exhibited appreciable cognitive decline. It remains likely that subjective cognitive dysfunction is due to a combination of psychiatric distress and various psychosocial stressors, fatigue/sleep disturbances, chronic medical conditions, and polypharmacy, superimposed upon the normal aging process.   [name] was unaccompanied during the current feedback session. Content of the current session focused on the results of her neuropsychological evaluation. [name] was given the opportunity to ask questions and her questions were answered. She was encouraged to reach out should additional questions arise. A copy of her report was provided at the conclusion of the visit.      One unit 96132 (31 minutes) was billed for Dr. Loralee time spent preparing for, conducting, and documenting the current feedback session with [name].

## 2024-05-26 DIAGNOSIS — Z1231 Encounter for screening mammogram for malignant neoplasm of breast: Secondary | ICD-10-CM | POA: Diagnosis not present

## 2024-05-26 LAB — HM MAMMOGRAPHY

## 2024-05-27 ENCOUNTER — Encounter: Payer: Self-pay | Admitting: Family Medicine

## 2024-06-01 ENCOUNTER — Other Ambulatory Visit: Payer: Self-pay | Admitting: Family Medicine

## 2024-06-02 ENCOUNTER — Encounter: Payer: Self-pay | Admitting: Family Medicine

## 2024-06-06 NOTE — Telephone Encounter (Signed)
 Please fax my order for a bone density test to [facility]

## 2024-06-07 ENCOUNTER — Ambulatory Visit (INDEPENDENT_AMBULATORY_CARE_PROVIDER_SITE_OTHER): Admitting: Family Medicine

## 2024-06-07 ENCOUNTER — Other Ambulatory Visit: Payer: Self-pay | Admitting: Gastroenterology

## 2024-06-07 DIAGNOSIS — E2839 Other primary ovarian failure: Secondary | ICD-10-CM

## 2024-06-07 DIAGNOSIS — Z0000 Encounter for general adult medical examination without abnormal findings: Secondary | ICD-10-CM | POA: Diagnosis not present

## 2024-06-07 NOTE — Progress Notes (Signed)
 PATIENT CHECK-IN and HEALTH RISK ASSESSMENT QUESTIONNAIRE:  -completed by phone/video for upcoming Medicare Preventive Visit   Pre-Visit Check-in: 1)Vitals (height, wt, BP, etc) - record in vitals section for visit on day of visit Request home vitals (wt, BP, etc.) and enter into vitals, THEN update Vital Signs SmartPhrase below at the top of the HPI. See below.  2)Review and Update Medications, Allergies PMH, Surgeries, Social history in Epic 3)Hospitalizations in the last year with date/reason? n  4)Review and Update Care Team (patient's specialists) in Epic 5) Complete PHQ9 in Epic  6) Complete Fall Screening in Epic 7)Review all Health Maintenance Due and order under PCP if not done.  Medicare Wellness Patient Questionnaire:  Answer theses question about your habits: How often do you have a drink containing alcohol?n How many drinks containing alcohol do you have on a typical day when you are drinking?na How often do you have six or more drinks on one occasion?na Have you ever smoked?y Quit date if applicable? 2010  How many packs a day do/did you smoke? 1 ppd Do you use smokeless tobacco?n Do you use an illicit drugs?n On average, how many days per week do you engage in moderate to strenuous exercise (like a brisk walk)?2-3 days per week, machines, core and upper body On average, how many minutes do you engage in exercise at this level? Goes to the gym Typical breakfast: Typical lunch: skips lunch Typical dinner:has salad with kale romaine Typical snacks: has not been snacking as much  Beverages: coffee, water, zero sugar sprite and body armour  Answer theses question about your everyday activities: Can you perform most household chores?n Are you deaf or have significant trouble hearing?had audiology exam and reports hearing was ok Do you feel that you have a problem with memory? Saw Dr. Evonnie memory recently and reports doing ok Do you feel safe at home?y Last dentist visit?  Does not go, hates dentist 8. Do you have any difficulty performing your everyday activities?some, daughter helps Are you having any difficulty walking, taking medications on your own, and or difficulty managing daily home needs?y Do you have difficulty walking or climbing stairs?can do it but holds on Do you have difficulty dressing or bathing?y Do you have difficulty doing errands alone such as visiting a doctor's office or shopping?n Do you currently have any difficulty preparing food and eating?y Do you currently have any difficulty using the toilet?n Do you have any difficulty managing your finances?y Do you have any difficulties with housekeeping of managing your housekeeping? Daughter does cleaning and she sits when cooking   Do you have Advanced Directives in place (Living Will, Healthcare Power or [place])? n   Last eye Exam and location[facility]   Do you currently use prescribed or non-prescribed narcotic or opioid pain medications?n  Do you have a history or close family history of breast, ovarian, tubal or peritoneal cancer or a family member with BRCA (breast cancer susceptibility 1 and 2) gene mutations?n     ----------------------------------------------------------------------------------------------------------------------------------------------------------------------------------------------------------------------  Because this visit was a virtual/telehealth visit, some criteria may be missing or patient reported. Any vitals not documented were not able to be obtained and vitals that have been documented are patient reported.    MEDICARE ANNUAL PREVENTIVE VISIT WITH PROVIDER: (Welcome to [facility], initial annual wellness or annual wellness exam)  Virtual Visit via Phone Note  I connected with Bonnie Gonzalez on 06/07/24 by phone and verified that I am speaking with the correct person using two identifiers.  Location patient: home Location provider:work or  home office Persons participating in the virtual visit: patient, provider  Concerns and/or follow up today: doing well, reports sees endo on Thursday and will have labs.    See HM section in Epic for other details of completed HM.    ROS: negative for report of fevers, unintentional weight loss, vision changes, vision loss, hearing loss or change, chest pain, sob, hemoptysis, melena, hematochezia, hematuria, falls, bleeding or bruising, thoughts of suicide or self harm, memory loss  Patient-completed extensive health risk assessment - reviewed and discussed with the patient: See Health Risk Assessment completed with patient prior to the visit either above or in recent phone note. This was reviewed in detailed with the patient today and appropriate recommendations, orders and referrals were placed as needed per Summary below and patient instructions.   Review of Medical History: -PMH, PSH, Family History and current specialty and care providers reviewed and updated and listed below   Patient Care Team: Johnny Garnette LABOR, MD as PCP - General (Family Medicine) Shlomo Wilbert SAUNDERS, MD as PCP - Cardiology (Cardiology) Tat, Asberry RAMAN, DO as Consulting Physician (Neurology) [facility], [place]   Past Medical History:  Diagnosis Date   Acquired hypothyroidism 10/01/2010   Allergic rhinitis 07/20/2009   Arthritis    AVM (arteriovenous malformation) of colon, acquired with hemorrhage    Back pain 10/18/2015   Bilateral carpal tunnel syndrome 07/20/2023   Bilateral leg edema 03/31/2018   Centrilobular emphysema 02/04/2019   HRCT 09/15/18; 04/09/23   Chronic diastolic CHF (congestive heart failure) 01/28/2019   TTE 05/25/2023 notes mild diastolic dysfunction, grade 1B elevated left ventricular pressure     Chronic respiratory failure with hypoxia 02/04/2019   6 Min Walk 08/31/18 at [facility]   Chronic urticaria 12/21/2019   COPD (chronic obstructive pulmonary disease) 09/01/2018    Quit smoking 2010  - Spirometry 08/31/2018  FEV1 1.1 (51%)  Ratio 66 s prior rx with typical curvature  - 08/31/2018  After extensive coaching inhaler device,  effectiveness =    90% with elipta > try anoro sample > no better so d/c'  - PFT's  11/10/2018  FEV1 1.24 (59 % ) ratio 75 (ratio with fev1/VC =  63)  p no % improvement from saba p nothing prior to study with DLCO  105  % corrects to 132  %    DOE (dyspnea on exertion) 08/31/2018   Onset 05/2018 with background of unexplained leg swelling x 01/2018   -echo 04/13/18 just G1   diastolic dysfunction s PH   -  08/31/2018   Walked RA x one lap @ 185 stopped due to  Sob/ sats 89% at nl pace  - Spirometry 08/31/2018  FEV1 1.1 (51%)  Ratio 66 s prior rx  - 09/28/2018 could not do full pfts 09/28/2018  - Collagen vasc profile 09/28/18  For ESR =  54 (was 102 before prednisone )  Neg collag   Dyshidrotic eczema 12/13/2010   Epigastric pain 11/03/2007   Esophageal reflux 11/03/2007   Essential hypertension 01/26/2019   Essential tremor    Generalized anxiety disorder with panic attacks 01/20/2018   Headache 11/03/2007   Hiatal hernia 04/10/2023   Noted on chest CT   History of COVID-19 11/21/2021   Hot flashes 10/28/2023   Irritable bowel syndrome 07/13/2009   Major depressive disorder 07/17/2020   Mixed hyperlipidemia 07/20/2009   Obesity    OSA (obstructive sleep apnea) 07/26/2015   HST 08/2015  mild OSA, AHI 8/hour, lowest desaturation 81%, desaturations noted without respiratory events  Formatting of this note might be different from the original. PSG 05/20/19 AHI 25.1   Osteopenia of multiple sites 06/01/2019   DEXA 05/24/2019: Right femur neck T- 2.2, left femur neck T- 1.5, right total femur T -0.5, left total femur T -0.5, AP total spine T- 1.9. FRAX 10-year probability of major osteoporotic fracture is 8.8% and a hip fracture is 1.2%  DEXA 05/05/2019: Right femur neck T- 1.9, left femur neck T- 2.0, right total femur T -1.3, left total femur   Oxygen   deficiency    Polyp of cecum    Polyp of sigmoid colon    Polyp of transverse colon    Positive colorectal cancer screening using Cologuard test    Postinflammatory pulmonary fibrosis 09/15/2018   ESR    10/161/9  = 102   > rx short term prednisone  ? slt improvement in symptoms  HRCT 09/15/2018   1. Very mild basilar subpleural ground-glass, reticulation and traction bronchiolectasis, findings which may be minimally progressive from 06/29/2017 and are indicative of interstitial lung disease such as nonspecific interstitial pneumonitis. Findings are indeterminate for UIP per consensus gu   Psychophysiological insomnia 03/29/2019   Type 2 diabetes mellitus 02/09/2017   UTI (urinary tract infection)    Vertigo 03/29/2014   Vitamin D  deficiency 09/15/2020    Past Surgical History:  Procedure Laterality Date   ANKLE FRACTURE SURGERY Right    benign tumor      removed from groin   COLONOSCOPY WITH PROPOFOL  N/A 05/15/2022   per Dr. Shila, adenomatous polyps, repeat in 5 yrs   EYE SURGERY     HOT HEMOSTASIS N/A 05/15/2022   Procedure: HOT HEMOSTASIS (ARGON PLASMA COAGULATION/BICAP);  Surgeon: Nandigam, Kavitha V, MD;  Location: [facility];  Service: Gastroenterology;  Laterality: N/A;   POLYPECTOMY  05/15/2022   Procedure: POLYPECTOMY;  Surgeon: Shila Gustav GAILS, MD;  Location: [facility];  Service: Gastroenterology;;   WRIST ARTHROCENTESIS N/A 2001    Social History   Socioeconomic History   Marital status: Divorced    Spouse name: Not on file   Number of children: 4   Years of education: 13   Highest education level: Some college, no degree  Occupational History    Comment: retired  Tobacco Use   Smoking status: Former    Current packs/day: 0.00    Average packs/day: 1 pack/day for 35.0 years (35.0 ttl pk-yrs)    Types: Cigarettes    Start date: 12/01/1973    Quit date: 12/01/2008    Years since quitting: 15.5   Smokeless tobacco: Never  Vaping Use   Vaping  status: Never Used  Substance and Sexual Activity   Alcohol use: Not Currently   Drug use: No   Sexual activity: Not Currently  Other Topics Concern   Not on file  Social History Narrative   Lives alone.   She has four grown children.   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   Highest level of education:  1.5 years of college      Right Handed   Is on 3 L of oxygen     Social Drivers of Health   Financial Resource Strain: Low Risk  (04/15/2022)   Overall Financial Resource Strain (CARDIA)    Difficulty of Paying Living Expenses: Not hard at all  Food Insecurity: Low Risk  (12/16/2023)   Received from [facility]   Hunger Vital Sign  Within the past 12 months, you worried that your food would run out before you got money to buy more: Never true    Within the past 12 months, the food you bought just didn't last and you didn't have money to get more. : Never true  Transportation Needs: No Transportation Needs (12/16/2023)   Received from [facility]    In the past 12 months, has lack of reliable transportation kept you from medical appointments, meetings, work or from getting things needed for daily living? : No  Physical Activity: Inactive (04/16/2023)   Exercise Vital Sign    Days of Exercise per Week: 0 days    Minutes of Exercise per Session: 0 min  Stress: No Stress Concern Present (04/15/2022)   [employer] of Occupational Health - Occupational Stress Questionnaire    Feeling of Stress : Only a little  Social Connections: Socially Isolated (04/15/2022)   Social Connection and Isolation Panel    Frequency of Communication with Friends and Family: More than three times a week    Frequency of Social Gatherings with Friends and Family: More than three times a week    Attends Religious Services: Never    Database administrator or Organizations: No    Attends Banker Meetings: Never    Marital Status: Divorced   Catering manager Violence: Not At Risk (04/15/2022)   Humiliation, Afraid, Rape, and Kick questionnaire    Fear of Current or Ex-Partner: No    Emotionally Abused: No    Physically Abused: No    Sexually Abused: No    Family History  Problem Relation Age of Onset   Arthritis/Rheumatoid Mother    Alzheimer's disease Father    Diabetes Mellitus II Father    Dementia Father    Diabetes Mellitus II Sister    Dementia Brother    Diabetes Mellitus II Brother    Asthma Other        fhx   Depression Other        fhx   Parkinsonism Other        fhx   Lupus Other        fhx    Current Outpatient Medications on File Prior to Visit  Medication Sig Dispense Refill   albuterol  (PROVENTIL  HFA;VENTOLIN  HFA) 108 (90 Base) MCG/ACT inhaler Inhale 2 puffs into the lungs every 4 (four) hours as needed for wheezing or shortness of breath. 1 Inhaler 11   ALPRAZolam  (XANAX ) 0.5 MG tablet TAKE 1 TABLET (0.5 MG TOTAL) BY MOUTH 2 (TWO) TIMES DAILY AS NEEDED. (Patient taking differently: Take 0.25-0.5 mg by mouth 2 (two) times daily as needed for anxiety or sleep.) 60 tablet 5   atorvastatin  (LIPITOR) 80 MG tablet Take 1 tablet by mouth once daily 90 tablet 0   azelastine  (ASTELIN ) 0.1 % nasal spray Place 1 spray into both nostrils 2 (two) times daily as needed for allergies. 30 mL 1   benzonatate  (TESSALON ) 200 MG capsule TAKE 1 CAPSULE BY MOUTH EVERY 8 HOURS AS NEEDED FOR COUGH (Patient not taking: Reported on 03/22/2024) 60 capsule 1   buPROPion ER (WELLBUTRIN SR) 100 MG 12 hr tablet Take 100 mg by mouth daily.     cetirizine  (ZYRTEC ) 10 MG tablet Take 1 tablet (10 mg total) by mouth daily. 90 tablet 3   Cholecalciferol (VITAMIN D3) 125 MCG (5000 UT) CAPS Take 1 capsule (5,000 Units total) by mouth daily. 12 capsule 3   Cholecalciferol 125 MCG (  5000 UT) TABS Take 5,000 Units by mouth in the morning.     Continuous Glucose Sensor (FREESTYLE LIBRE 2 SENSOR) MISC Inject 1 kit into the skin every 14  (fourteen) days.     Dextromethorphan-guaiFENesin (MUCINEX COUGH & CHEST CONGEST PO) Take 1 capsule by mouth as needed (Patient taking as needed).     DULoxetine  (CYMBALTA ) 60 MG capsule Take 1 capsule (60 mg total) by mouth daily. (Patient taking differently: Take 120 mg by mouth at bedtime. Take two capsules daily) 90 capsule 3   esomeprazole  (NEXIUM ) 40 MG capsule TAKE 1 CAPSULE BY MOUTH TWICE DAILY BEFORE A MEAL 180 capsule 0   Fezolinetant  (VEOZAH ) 45 MG TABS Take 1 tablet (45 mg total) by mouth daily. 30 tablet 11   Fluticasone-Umeclidin-Vilant (TRELEGY ELLIPTA) 100-62.5-25 MCG/INH AEPB Inhale 1 puff into the lungs daily.     furosemide  (LASIX ) 20 MG tablet Take 1 tablet (20 mg total) by mouth daily. 90 tablet 3   glucose 4 GM chewable tablet Chew 1 tablet (4 g total) by mouth as needed for low blood sugar (< 70). 50 tablet 0   hydrOXYzine  (VISTARIL ) 25 MG capsule Take 1 capsule by mouth three times daily as needed 90 capsule 0   insulin  aspart (NOVOLOG ) 100 UNIT/ML injection Use sliding scale provided at time of discharge, administer 3 times daily with meals (Patient taking differently: Inject 15-48 Units into the skin See admin instructions. Inject 20-40 units subcutaneously 3 times daily with meals and inject 15-35 units subcutaneously at night if needed with nighttime snacking) 10 mL 0   insulin  glargine, 2 Unit Dial, (TOUJEO  MAX SOLOSTAR) 300 UNIT/ML Solostar Pen Inject 80 Units into the skin 2 (two) times daily.     Insulin  Pen Needle (B-D UF III MINI PEN NEEDLES) 31G X 5 MM MISC Use to administer insulin  five times a day     levothyroxine  (SYNTHROID ) 175 MCG tablet TAKE 1 TABLET BY MOUTH ONCE DAILY BEFORE BREAKFAST LABS  REQUIRED  FOR  FUTURE  REFILLS 90 tablet 0   methocarbamol  (ROBAXIN ) 500 MG tablet Take 1 tablet (500 mg total) by mouth every 6 (six) hours as needed for muscle spasms. (Patient not taking: Reported on 03/22/2024) 120 tablet 5   Misc Natural Products (COMPLETE MENOPAUSE  HEALTH PO) Take by mouth. [place] complete menopause gummies     MOUNJARO 12.5 MG/0.5ML Pen Inject 12.5 mg into the skin once a week.     naproxen  (NAPROSYN ) 500 MG tablet Take 1 tablet by mouth twice daily 60 tablet 5   Nerve Stimulator (CLEVER CHOICE TENS UNIT) DEVI Place 1 Dose onto the skin as needed (pain.). Tens 7000     NYAMYC  powder APPLY 1 APPLICATION TOPICALLY 2 TIMES DAILY AS NEEDED FOR RASH 15 g 0   OXYGEN  Inhale 3 L into the lungs continuous.     Polyethyl Glycol-Propyl Glycol (SYSTANE OP) Apply to eye.     primidone  (MYSOLINE ) 50 MG tablet Take 1 tablet (50 mg total) by mouth at bedtime. 90 tablet 3   Rhubarb (ESTROVEN COMPLETE PO) Take by mouth. (Patient not taking: Reported on 03/22/2024)     temazepam  (RESTORIL ) 15 MG capsule Take 15 mg by mouth at bedtime.     terbinafine  (LAMISIL ) 250 MG tablet Take 1 tablet by mouth once daily (Patient not taking: Reported on 03/22/2024) 90 tablet 0   No current facility-administered medications on file prior to visit.    Allergies  Allergen Reactions   Aspirin  Mother had to be resuscitated after ingesting this   Hydrocodone-Acetaminophen  Hives   Oxycodone-Acetaminophen  Hives   Penicillins Hives    Did it involve swelling of the face/tongue/throat, SOB, or low BP? Yes-hives Did it involve sudden or severe rash/hives, skin peeling, or any reaction on the inside of your mouth or nose? Unk Did you need to seek medical attention at a hospital or doctor's office? Yes When did it last happen? Was in her 30's If all above answers are NO, may proceed with cephalosporin use.  Did it involve swelling of the face/tongue/throat, SOB, or low BP? Yes-hives Did it involve sudden or severe rash/hives, skin peeling, or any reaction on the inside of your mouth or nose? Unk Did you need to seek medical attention at a hospital or doctor's office? Yes When did it last happen? Was in her 30's If all above answers are NO, may proceed with  cephalosporin use.   Codeine Hives   Fluoxetine Hcl Itching and Other (See Comments)    Hair itching   Trazodone  Other (See Comments)    Couldn't walk       Physical Exam Vitals requested from patient and listed below if patient had equipment and was able to obtain at home for this virtual visit: There were no vitals filed for this visit. Estimated body mass index is 50.39 kg/m as calculated from the following:   Height as of 06/05/23: 5' (1.524 m).   Weight as of 03/22/24: 258 lb (117 kg).  EKG (optional): deferred due to virtual visit  GENERAL: alert, oriented, no acute distress detected, full vision exam deferred due to pandemic and/or virtual encounter  PSYCH/NEURO: pleasant and cooperative, no obvious depression or anxiety, speech and thought processing grossly intact, Cognitive function grossly intact  Flowsheet Row Office Visit from 07/20/2023 in [facility] [place]  PHQ-9 Total Score 7        06/07/2024    3:59 PM 07/20/2023   10:18 AM 04/16/2023   10:40 AM 12/05/2022   12:13 PM 11/11/2022    1:46 PM  Depression screen PHQ 2/9  Decreased Interest 0 1 0 3 1  Down, Depressed, Hopeless 0 1 0 2 1  PHQ - 2 Score 0 2 0 5 2  Altered sleeping  1 1 2 1   Tired, decreased energy  1 1 3 1   Change in appetite  0 0 0 1  Feeling bad or failure about yourself   1 0 2 1  Trouble concentrating  1 2 2 1   Moving slowly or fidgety/restless  1 1 1  0  Suicidal thoughts  0 0 1 0  PHQ-9 Score  7 5 16 7   Difficult doing work/chores  Somewhat difficult Somewhat difficult Very difficult Somewhat difficult       12/05/2022   12:13 PM 04/16/2023   10:36 AM 07/20/2023   10:17 AM 03/22/2024   10:39 AM 06/07/2024    3:59 PM  Fall Risk  Falls in the past year? 0 0 0 0 0  Was there an injury with Fall? 0 0 0 0 0  Fall Risk Category Calculator 0 0 0 0 0  Fall Risk Category (Retired) Low       (RETIRED) Patient Fall Risk Level Low fall risk       Patient at Risk for Falls  Due to No Fall Risks Impaired balance/gait;Impaired mobility Impaired balance/gait;No Fall Risks    Fall risk Follow up Falls evaluation completed  Falls evaluation completed  Falls evaluation completed Falls evaluation completed Falls evaluation completed     Data saved with a previous flowsheet row definition     SUMMARY AND PLAN:  Encounter for Medicare annual wellness exam  Estrogen deficiency - Plan: DG Bone Density  Discussed applicable health maintenance/preventive health measures and advised and referred or ordered per patient preferences: -she is seeing endo for labs, can do foot exam there -declines covid vaccine -wants to do dexa next Brittanee - placed order per her request -she says will be having eye exam soon Health Maintenance  Topic Date Due   Hepatitis C Screening  Never done   Diabetic kidney evaluation - Urine ACR  09/19/2011   FOOT EXAM  03/18/2019   HEMOGLOBIN A1C  10/22/2023   OPHTHALMOLOGY EXAM  11/29/2023   Hepatitis B Vaccines (2 of 3 - Risk 3-dose series) 03/27/2024   Diabetic kidney evaluation - eGFR measurement  04/20/2024   COVID-19 Vaccine (4 - 2024-25 season) 06/23/2024 (Originally 08/02/2023)   INFLUENZA VACCINE  07/01/2024   Colonoscopy  05/15/2025   MAMMOGRAM  05/26/2025   Medicare Annual Wellness (AWV)  06/07/2025   DTaP/Tdap/Td (4 - Td or Tdap) 12/24/2030   Pneumococcal Vaccine: 50+ Years  Completed   DEXA SCAN  Completed   Zoster Vaccines- Shingrix  Completed   HPV VACCINES  Aged Out   Meningococcal B Vaccine  Aged Out   Lung Cancer Screening  Discontinued      Education and counseling on the following was provided based on the above review of health and a plan/checklist for the patient, along with additional information discussed, was provided for the patient in the patient instructions :  -Advised on importance of completing advanced directives, discussed options for completing and provided information in patient instructions as  well -Advised and counseled on a healthy lifestyle - including the importance of a healthy diet, regular physical activity -Reviewed patient's current diet. Advised and counseled on a whole foods based healthy diet. A summary of a healthy diet was provided in the Patient Instructions.  -reviewed patient's current physical activity level and discussed exercise guidelines for adults. Discussed community resources and ideas for safe exercise at home to assist in meeting exercise guideline recommendations in a safe and healthy way.  -Advise yearly dental visits at minimum and regular eye exams   Follow up: see patient instructions     Patient Instructions  I really enjoyed getting to talk with you today! I am available on Tuesdays and Thursdays for virtual visits if you have any questions or concerns, or if I can be of any further assistance.   CHECKLIST FROM ANNUAL WELLNESS VISIT:  -Follow up (please call to schedule if not scheduled after visit):   -yearly for annual wellness visit with primary care office  Here is a list of your preventive care/health maintenance measures and the plan for each if any are due:  PLAN For any measures below that may be due:    1. Please request labs and foot exam when you see your endocrinolgist   2. Please get your diabeticc eye exam and send the report to Dr. Johnny   3. Sent and order for the bone density test   4. Can get vaccines at the pharmacy     Health Maintenance  Topic Date Due   Hepatitis C Screening  Never done   Diabetic kidney evaluation - Urine ACR  09/19/2011   FOOT EXAM  03/18/2019   COVID-19 Vaccine (4 - 2024-25 season)  08/02/2023   HEMOGLOBIN A1C  10/22/2023   OPHTHALMOLOGY EXAM  11/29/2023   Hepatitis B Vaccines (2 of 3 - Risk 3-dose series) 03/27/2024   Medicare Annual Wellness (AWV)  04/15/2024   Diabetic kidney evaluation - eGFR measurement  04/20/2024   INFLUENZA VACCINE  07/01/2024   Colonoscopy  05/15/2025    MAMMOGRAM  05/26/2025   DTaP/Tdap/Td (4 - Td or Tdap) 12/24/2030   Pneumococcal Vaccine: 50+ Years  Completed   DEXA SCAN  Completed   Zoster Vaccines- Shingrix  Completed   HPV VACCINES  Aged Out   Meningococcal B Vaccine  Aged Out   Lung Cancer Screening  Discontinued    -See a dentist at least yearly  -Get your eyes checked and then per your eye specialist's recommendations  -Other issues addressed today:   -I have included below further information regarding a healthy whole foods based diet, physical activity guidelines for adults, stress management and opportunities for social connections. I hope you find this information useful.   -----------------------------------------------------------------------------------------------------------------------------------------------------------------------------------------------------------------------------------------------------------    NUTRITION: -eat real food: lots of colorful vegetables (half the plate) and fruits -5-7 servings of vegetables and fruits per day (fresh or steamed is best), exp. 2 servings of vegetables with lunch and dinner and 2 servings of fruit per day. Berries and greens such as kale and collards are great choices.  -consume on a regular basis:  fresh fruits, fresh veggies, fish, nuts, seeds, healthy oils (such as olive oil, avocado oil), whole grains (make sure for bread/pasta/crackers/etc., that the first ingredient on label contains the word whole), legumes. -can eat small amounts of dairy and lean meat (no larger than the palm of your hand), but avoid processed meats such as ham, bacon, lunch meat, etc. -drink water -try to avoid fast food and pre-packaged foods, processed meat, ultra processed foods/beverages (donuts, candy, etc.) -most experts advise limiting sodium to < 2300mg  per day, should limit further is any chronic conditions such as high blood pressure, heart disease, diabetes, etc. The American  Heart Association advised that < 1500mg  is is ideal -try to avoid foods/beverages that contain any ingredients with names you do not recognize  -try to avoid foods/beverages  with added sugar or sweeteners/sweets  -try to avoid sweet drinks (including diet drinks): soda, juice, Gatorade, sweet tea, power drinks, diet drinks -try to avoid white rice, white bread, pasta (unless whole grain)  EXERCISE GUIDELINES FOR ADULTS: -if you wish to increase your physical activity, do so gradually and with the approval of your doctor -STOP and seek medical care immediately if you have any chest pain, chest discomfort or trouble breathing when starting or increasing exercise  -move and stretch your body, legs, feet and arms when sitting for long periods -Physical activity guidelines for optimal health in adults: -get at least 150 minutes per week of moderate exercise (can talk, but not sing); this is about 20-30 minutes of sustained activity 5-7 days per week or two 10-15 minute episodes of sustained activity 5-7 days per week -do some muscle building/resistance training/strength training at least 2 days per week  -balance exercises 3+ days per week:   Stand somewhere where you have something sturdy to hold onto if you lose balance    1) lift up on toes, then back down, start with 5x per day and work up to 20x   2) stand and lift one leg straight out to the side so that foot is a few inches of the floor, start with 5x each side  and work up to 20x each side   3) stand on one foot, start with 5 seconds each side and work up to 20 seconds on each side  If you need ideas or help with getting more active:  -Silver sneakers https://tools.silversneakers.com  -Walk with a Doc: http://www.duncan-[email or web address]/  -try to include resistance (weight lifting/strength building) and balance exercises twice per week: or the following link for  ideas: http://castillo-powell.com/  BuyDucts.dk  STRESS MANAGEMENT: -can try meditating, or just sitting quietly with deep breathing while intentionally relaxing all parts of your body for 5 minutes daily -if you need further help with stress, anxiety or depression please follow up with your primary doctor or contact the wonderful folks at [facility]: [phone]  SOCIAL CONNECTIONS: -options in [place] if you wish to engage in more social and exercise related activities:  -Silver sneakers https://tools.silversneakers.com  -Walk with a Doc: http://[email or web address].duncan-[email or web address]  -Check out the [facility] Active Adults 50+ section on the [place] of [employer] (hiking clubs, book clubs, cards and games, chess, exercise classes, aquatic classes and much more) - see the website for details: https://www.[place].gov/departments/parks-recreation/active-adults50  -YouTube has lots of exercise videos for different ages and abilities as well  -[facility] (a variety of indoor and outdoor inperson activities for adults). [phone]. [address]  -Virtual Online Classes (a variety of topics): see seniorplanet.org or call [phone]  -consider volunteering at a school, hospice center, church, senior center or elsewhere    ADVANCED HEALTHCARE DIRECTIVES:  [place] Advanced Directives assistance:   ExpressWeek.com.cy  Everyone should have advanced health care directives in place. This is so that you get the care you want, should you ever be in a situation where you are unable to make your own medical decisions.   From the [place] Advanced Directive Website: Advance Health Care Directives are legal documents in which you give written instructions about your health care if, in the future, you cannot speak for  yourself.   A health care power of attorney allows you to name a person you trust to make your health care decisions if you cannot make them yourself. A declaration of a desire for a natural death (or living will) is document, which states that you desire not to have your life prolonged by extraordinary measures if you have a terminal or incurable illness or if you are in a vegetative state. An advance instruction for mental health treatment makes a declaration of instructions, information and preferences regarding your mental health treatment. It also states that you are aware that the advance instruction authorizes a mental health treatment provider to act according to your wishes. It may also outline your consent or refusal of mental health treatment. A declaration of an anatomical gift allows anyone over the age of 21 to make a gift by will, organ donor card or other document.   Please see the following website or an elder law attorney for forms, FAQs and for completion of advanced directives: [place]  Print production planner Health Care Directives Advance Health Care Directives ([email or web address]  Or copy and paste the following to your web browser: [email or web address]          Chiquita JONELLE Cramp, DO

## 2024-06-07 NOTE — Patient Instructions (Addendum)
 I really enjoyed getting to talk with you today! I am available on Tuesdays and Thursdays for virtual visits if you have any questions or concerns, or if I can be of any further assistance.   CHECKLIST FROM ANNUAL WELLNESS VISIT:  -Follow up (please call to schedule if not scheduled after visit):   -yearly for annual wellness visit with primary care office  Here is a list of your preventive care/health maintenance measures and the plan for each if any are due:  PLAN For any measures below that may be due:    1. Please request labs and foot exam when you see your endocrinolgist   2. Please get your diabeticc eye exam and send the report to Dr. Johnny   3. Sent and order for the bone density test   4. Can get vaccines at the pharmacy     Health Maintenance  Topic Date Due   Hepatitis C Screening  Never done   Diabetic kidney evaluation - Urine ACR  09/19/2011   FOOT EXAM  03/18/2019   COVID-19 Vaccine (4 - 2024-25 season) 08/02/2023   HEMOGLOBIN A1C  10/22/2023   OPHTHALMOLOGY EXAM  11/29/2023   Hepatitis B Vaccines (2 of 3 - Risk 3-dose series) 03/27/2024   Medicare Annual Wellness (AWV)  04/15/2024   Diabetic kidney evaluation - eGFR measurement  04/20/2024   INFLUENZA VACCINE  07/01/2024   Colonoscopy  05/15/2025   MAMMOGRAM  05/26/2025   DTaP/Tdap/Td (4 - Td or Tdap) 12/24/2030   Pneumococcal Vaccine: 50+ Years  Completed   DEXA SCAN  Completed   Zoster Vaccines- Shingrix  Completed   HPV VACCINES  Aged Out   Meningococcal B Vaccine  Aged Out   Lung Cancer Screening  Discontinued    -See a dentist at least yearly  -Get your eyes checked and then per your eye specialist's recommendations  -Other issues addressed today:   -I have included below further information regarding a healthy whole foods based diet, physical activity guidelines for adults, stress management and opportunities for social connections. I hope you find this information useful.    -----------------------------------------------------------------------------------------------------------------------------------------------------------------------------------------------------------------------------------------------------------    NUTRITION: -eat real food: lots of colorful vegetables (half the plate) and fruits -5-7 servings of vegetables and fruits per day (fresh or steamed is best), exp. 2 servings of vegetables with lunch and dinner and 2 servings of fruit per day. Berries and greens such as kale and collards are great choices.  -consume on a regular basis:  fresh fruits, fresh veggies, fish, nuts, seeds, healthy oils (such as olive oil, avocado oil), whole grains (make sure for bread/pasta/crackers/etc., that the first ingredient on label contains the word whole), legumes. -can eat small amounts of dairy and lean meat (no larger than the palm of your hand), but avoid processed meats such as ham, bacon, lunch meat, etc. -drink water -try to avoid fast food and pre-packaged foods, processed meat, ultra processed foods/beverages (donuts, candy, etc.) -most experts advise limiting sodium to < 2300mg  per day, should limit further is any chronic conditions such as high blood pressure, heart disease, diabetes, etc. The American Heart Association advised that < 1500mg  is is ideal -try to avoid foods/beverages that contain any ingredients with names you do not recognize  -try to avoid foods/beverages  with added sugar or sweeteners/sweets  -try to avoid sweet drinks (including diet drinks): soda, juice, Gatorade, sweet tea, power drinks, diet drinks -try to avoid white rice, white bread, pasta (unless whole grain)  EXERCISE GUIDELINES FOR  ADULTS: -if you wish to increase your physical activity, do so gradually and with the approval of your doctor -STOP and seek medical care immediately if you have any chest pain, chest discomfort or trouble breathing when starting or  increasing exercise  -move and stretch your body, legs, feet and arms when sitting for long periods -Physical activity guidelines for optimal health in adults: -get at least 150 minutes per week of moderate exercise (can talk, but not sing); this is about 20-30 minutes of sustained activity 5-7 days per week or two 10-15 minute episodes of sustained activity 5-7 days per week -do some muscle building/resistance training/strength training at least 2 days per week  -balance exercises 3+ days per week:   Stand somewhere where you have something sturdy to hold onto if you lose balance    1) lift up on toes, then back down, start with 5x per day and work up to 20x   2) stand and lift one leg straight out to the side so that foot is a few inches of the floor, start with 5x each side and work up to 20x each side   3) stand on one foot, start with 5 seconds each side and work up to 20 seconds on each side  If you need ideas or help with getting more active:  -Silver sneakers https://tools.silversneakers.com  -Walk with a Doc: http://www.duncan-[email or web address]/  -try to include resistance (weight lifting/strength building) and balance exercises twice per week: or the following link for ideas: http://castillo-powell.com/  BuyDucts.dk  STRESS MANAGEMENT: -can try meditating, or just sitting quietly with deep breathing while intentionally relaxing all parts of your body for 5 minutes daily -if you need further help with stress, anxiety or depression please follow up with your primary doctor or contact the wonderful folks at [facility]: [phone]  SOCIAL CONNECTIONS: -options in [place] if you wish to engage in more social and exercise related activities:  -Silver sneakers https://tools.silversneakers.com  -Walk with a Doc: http://www.duncan-[email or web address]/  -Check out the [facility] Active Adults 50+  section on the [place] [employer] (hiking clubs, book clubs, cards and games, chess, exercise classes, aquatic classes and much more) - see the website for details: https://www.[place].gov/departments/parks-recreation/active-adults50  -YouTube has lots of exercise videos for different ages and abilities as well  -[facility] (a variety of indoor and outdoor inperson activities for adults). [phone]. [address]  -Virtual Online Classes (a variety of topics): see seniorplanet.org or call [phone]  -consider volunteering at a school, hospice center, church, senior center or elsewhere    ADVANCED HEALTHCARE DIRECTIVES:  [place] Advanced Directives assistance:   ExpressWeek.com.cy  Everyone should have advanced health care directives in place. This is so that you get the care you want, should you ever be in a situation where you are unable to make your own medical decisions.   From the [place] Advanced Directive Website: Advance Health Care Directives are legal documents in which you give written instructions about your health care if, in the future, you cannot speak for yourself.   A health care power of attorney allows you to name a person you trust to make your health care decisions if you cannot make them yourself. A declaration of a desire for a natural death (or living will) is document, which states that you desire not to have your life prolonged by extraordinary measures if you have a terminal or incurable illness or if you are in a vegetative state. An advance instruction for mental  health treatment makes a declaration of instructions, information and preferences regarding your mental health treatment. It also states that you are aware that the advance instruction authorizes a mental health treatment provider to act according to your wishes. It may also outline your consent or refusal of  mental health treatment. A declaration of an anatomical gift allows anyone over the age of 52 to make a gift by will, organ donor card or other document.   Please see the following website or an elder law attorney for forms, FAQs and for completion of advanced directives: [place]  Print production planner Health Care Directives Advance Health Care Directives ([email or web address]  Or copy and paste the following to your web browser: [email or web address]

## 2024-06-09 DIAGNOSIS — Z794 Long term (current) use of insulin: Secondary | ICD-10-CM | POA: Diagnosis not present

## 2024-06-09 DIAGNOSIS — E1165 Type 2 diabetes mellitus with hyperglycemia: Secondary | ICD-10-CM | POA: Diagnosis not present

## 2024-06-11 ENCOUNTER — Other Ambulatory Visit: Payer: Self-pay | Admitting: Family Medicine

## 2024-06-27 DIAGNOSIS — F330 Major depressive disorder, recurrent, mild: Secondary | ICD-10-CM | POA: Diagnosis not present

## 2024-06-27 DIAGNOSIS — F5101 Primary insomnia: Secondary | ICD-10-CM | POA: Diagnosis not present

## 2024-06-27 DIAGNOSIS — F411 Generalized anxiety disorder: Secondary | ICD-10-CM | POA: Diagnosis not present

## 2024-07-05 ENCOUNTER — Other Ambulatory Visit: Payer: Self-pay | Admitting: Gastroenterology

## 2024-07-19 DIAGNOSIS — J9611 Chronic respiratory failure with hypoxia: Secondary | ICD-10-CM | POA: Diagnosis not present

## 2024-07-19 DIAGNOSIS — J432 Centrilobular emphysema: Secondary | ICD-10-CM | POA: Diagnosis not present

## 2024-07-19 DIAGNOSIS — J449 Chronic obstructive pulmonary disease, unspecified: Secondary | ICD-10-CM | POA: Diagnosis not present

## 2024-07-20 ENCOUNTER — Other Ambulatory Visit: Payer: Self-pay | Admitting: Gastroenterology

## 2024-07-28 ENCOUNTER — Other Ambulatory Visit: Payer: Self-pay | Admitting: Family Medicine

## 2024-07-28 DIAGNOSIS — J309 Allergic rhinitis, unspecified: Secondary | ICD-10-CM

## 2024-07-31 ENCOUNTER — Other Ambulatory Visit: Payer: Self-pay | Admitting: Family Medicine

## 2024-08-04 DIAGNOSIS — E1165 Type 2 diabetes mellitus with hyperglycemia: Secondary | ICD-10-CM | POA: Diagnosis not present

## 2024-08-04 DIAGNOSIS — Z794 Long term (current) use of insulin: Secondary | ICD-10-CM | POA: Diagnosis not present

## 2024-08-05 ENCOUNTER — Other Ambulatory Visit: Payer: Self-pay | Admitting: Gastroenterology

## 2024-08-07 ENCOUNTER — Other Ambulatory Visit: Payer: Self-pay | Admitting: Family Medicine

## 2024-08-10 ENCOUNTER — Ambulatory Visit (INDEPENDENT_AMBULATORY_CARE_PROVIDER_SITE_OTHER): Admitting: Family Medicine

## 2024-08-10 ENCOUNTER — Encounter: Payer: Self-pay | Admitting: Family Medicine

## 2024-08-10 VITALS — BP 126/74 | HR 89 | Temp 97.8°F | Wt 260.0 lb

## 2024-08-10 DIAGNOSIS — E039 Hypothyroidism, unspecified: Secondary | ICD-10-CM | POA: Diagnosis not present

## 2024-08-10 LAB — LIPID PANEL
Cholesterol: 151 mg/dL (ref 0–200)
HDL: 47.1 mg/dL (ref >39.00)
LDL Cholesterol: 77 mg/dL (ref 0–99)
NonHDL: 103.91
Total CHOL/HDL Ratio: 3
Triglycerides: 134 mg/dL (ref 0.0–149.0)
VLDL: 26.8 mg/dL (ref 0.0–40.0)

## 2024-08-10 LAB — CBC WITH DIFFERENTIAL/PLATELET
Basophils Absolute: 0.1 K/uL (ref 0.0–0.1)
Basophils Relative: 0.4 % (ref 0.0–3.0)
Eosinophils Absolute: 0.3 K/uL (ref 0.0–0.7)
Eosinophils Relative: 2.2 % (ref 0.0–5.0)
HCT: 40.5 % (ref 36.0–46.0)
Hemoglobin: 12.6 g/dL (ref 12.0–15.0)
Lymphocytes Relative: 23.2 % (ref 12.0–46.0)
Lymphs Abs: 3.1 K/uL (ref 0.7–4.0)
MCHC: 31 g/dL (ref 30.0–36.0)
MCV: 80 fl (ref 78.0–100.0)
Monocytes Absolute: 0.9 K/uL (ref 0.1–1.0)
Monocytes Relative: 6.9 % (ref 3.0–12.0)
Neutro Abs: 9 K/uL — ABNORMAL HIGH (ref 1.4–7.7)
Neutrophils Relative %: 67.3 % (ref 43.0–77.0)
Platelets: 267 K/uL (ref 150.0–400.0)
RBC: 5.07 Mil/uL (ref 3.87–5.11)
RDW: 15.9 % — ABNORMAL HIGH (ref 11.5–15.5)
WBC: 13.4 K/uL — ABNORMAL HIGH (ref 4.0–10.5)

## 2024-08-10 LAB — T3, FREE: T3, Free: 3.1 pg/mL (ref 2.3–4.2)

## 2024-08-10 LAB — TSH: TSH: 1.92 u[IU]/mL (ref 0.35–5.50)

## 2024-08-10 LAB — T4, FREE: Free T4: 0.88 ng/dL (ref 0.60–1.60)

## 2024-08-10 MED ORDER — ESOMEPRAZOLE MAGNESIUM 40 MG PO CPDR: 40.0000 mg | DELAYED_RELEASE_CAPSULE | Freq: Two times a day (BID) | ORAL | 3 refills | Status: AC

## 2024-08-10 MED ORDER — ZORYVE 0.15 % EX CREA: 1.0000 | TOPICAL_CREAM | Freq: Every day | CUTANEOUS | 5 refills | Status: AC

## 2024-08-10 MED ORDER — FUROSEMIDE 20 MG PO TABS: 20.0000 mg | ORAL_TABLET | Freq: Every day | ORAL | 3 refills | Status: AC

## 2024-08-10 MED ORDER — ATORVASTATIN CALCIUM 80 MG PO TABS: 80.0000 mg | ORAL_TABLET | Freq: Every day | ORAL | 3 refills | Status: AC

## 2024-08-10 NOTE — Progress Notes (Signed)
   Subjective:    Patient ID: Bonnie Gonzalez, female    DOB: June 12, 1956, 68 y.o.   MRN: [id number]  HPI Here for medication refills. She has been feeling fairly well. She had A1c and CMP drawn last week.    Review of Systems  Constitutional: Negative.  Negative for activity change, appetite change, diaphoresis, fatigue, fever and unexpected weight change.  HENT: Negative.  Negative for congestion, ear pain, hearing loss, nosebleeds, sore throat, tinnitus, trouble swallowing and voice change.   Eyes: Negative.  Negative for photophobia, pain, discharge, redness and visual disturbance.  Respiratory:  Positive for shortness of breath. Negative for apnea, cough, choking, chest tightness, wheezing and stridor.   Cardiovascular: Negative.  Negative for chest pain, palpitations and leg swelling.  Gastrointestinal: Negative.  Negative for abdominal distention, abdominal pain, blood in stool, constipation, diarrhea, nausea, rectal pain and vomiting.  Genitourinary: Negative.  Negative for difficulty urinating, dysuria, enuresis, flank pain, frequency, hematuria, menstrual problem, urgency, vaginal bleeding, vaginal discharge and vaginal pain.  Musculoskeletal: Negative.  Negative for arthralgias, back pain, gait problem, joint swelling, myalgias, neck pain and neck stiffness.  Skin: Negative.  Negative for color change, pallor, rash and wound.  Neurological: Negative.  Negative for dizziness, tremors, seizures, syncope, speech difficulty, weakness, light-headedness, numbness and headaches.  Hematological:  Negative for adenopathy. Does not bruise/bleed easily.  Psychiatric/Behavioral: Negative.  Negative for agitation, behavioral problems, confusion, dysphoric mood, hallucinations and sleep disturbance. The patient is not nervous/anxious.        Objective:   Physical Exam Constitutional:      Appearance: She is obese.     Comments: Wearing [place] oxygen    Cardiovascular:     Rate and Rhythm: Normal  rate and regular rhythm.     Pulses: Normal pulses.     Heart sounds: Normal heart sounds.  Pulmonary:     Effort: Pulmonary effort is normal.     Breath sounds: Normal breath sounds.  Neurological:     Mental Status: She is alert.           Assessment & Plan:  She seems to be doing well overall. We will check thyroid  levels today. Garnette Olmsted, MD

## 2024-08-11 ENCOUNTER — Ambulatory Visit: Payer: Self-pay | Admitting: Family Medicine

## 2024-08-14 ENCOUNTER — Other Ambulatory Visit: Payer: Self-pay | Admitting: Family Medicine

## 2024-08-18 ENCOUNTER — Encounter: Payer: Self-pay | Admitting: Family Medicine

## 2024-08-19 DIAGNOSIS — J9611 Chronic respiratory failure with hypoxia: Secondary | ICD-10-CM | POA: Diagnosis not present

## 2024-08-19 DIAGNOSIS — J449 Chronic obstructive pulmonary disease, unspecified: Secondary | ICD-10-CM | POA: Diagnosis not present

## 2024-08-19 DIAGNOSIS — J432 Centrilobular emphysema: Secondary | ICD-10-CM | POA: Diagnosis not present

## 2024-08-22 NOTE — Telephone Encounter (Signed)
 There were 3 high notices in her CBC. This measures all her blood cells. These are not significantly out of range, so nothing to worry about

## 2024-08-23 ENCOUNTER — Ambulatory Visit: Admitting: Gastroenterology

## 2024-08-23 ENCOUNTER — Encounter: Payer: Self-pay | Admitting: Gastroenterology

## 2024-08-23 ENCOUNTER — Other Ambulatory Visit: Payer: Self-pay | Admitting: Family Medicine

## 2024-08-23 VITALS — BP 130/66 | HR 74 | Ht 60.0 in | Wt 258.0 lb

## 2024-08-23 DIAGNOSIS — J449 Chronic obstructive pulmonary disease, unspecified: Secondary | ICD-10-CM

## 2024-08-23 DIAGNOSIS — J439 Emphysema, unspecified: Secondary | ICD-10-CM

## 2024-08-23 DIAGNOSIS — K5904 Chronic idiopathic constipation: Secondary | ICD-10-CM

## 2024-08-23 DIAGNOSIS — K581 Irritable bowel syndrome with constipation: Secondary | ICD-10-CM

## 2024-08-23 DIAGNOSIS — J42 Unspecified chronic bronchitis: Secondary | ICD-10-CM

## 2024-08-23 DIAGNOSIS — J309 Allergic rhinitis, unspecified: Secondary | ICD-10-CM

## 2024-08-23 NOTE — Progress Notes (Unsigned)
 Bonnie Gonzalez    [id number]    1956-03-27  Primary Care Physician:Bonnie Gonzalez LABOR, MD  Referring Physician: Johnny Gonzalez LABOR, MD [address] [place],  [place] [address]   Chief complaint: IBS constipation  Discussed the use of AI scribe software for clinical note transcription with the patient, who gave verbal consent to proceed.  History of Present Illness Bonnie Darcelle Herrada [name] is a 68 year old female who presents for follow-up on her constipation treatment regimen.  Chronic constipation - Chronic constipation with bowel movements at least every other day - Current Linzess  dose provides partial relief, but bowel movements remain straining and difficult - History of an episode with four bowel movements in one day following a period of constipation - No hematochezia or melena - Difficulty with bowel preparation for colonoscopy, requiring a two-day regimen for effective cleansing  Dietary and fluid intake - Actively increasing dietary fiber by consuming a salad with every evening meal - Drinks at least 64 ounces of water daily due to heavy sweating - Regularly carries water to maintain hydration  Hepatic steatosis - History of fatty liver  Chronic obstructive pulmonary disease and emphysema - History of COPD and emphysema - Uses supplemental oxygen  at 2 liters per minute  Glycemic control and urinary tract health - Currently taking Jardiance - Blood glucose levels are well controlled on current regimen - History of urinary tract infections, but no current urinary symptoms    Colonoscopy Tracey 15, 2023 for positive Cologuard    A. COLON, CECUM, TRANSVERSE, POLYPECTOMY: Tubular adenoma. Negative for high-grade dysplasia.  B. COLON, SIGMOID, POLYPECTOMY: Hyperplastic polyp. Negative for dysplasia.    Outpatient Encounter Medications as of 08/23/2024  Medication Sig   albuterol  (PROVENTIL  HFA;VENTOLIN  HFA) 108 (90 Base) MCG/ACT inhaler  Inhale 2 puffs into the lungs every 4 (four) hours as needed for wheezing or shortness of breath.   ALPRAZolam  (XANAX ) 0.5 MG tablet TAKE 1 TABLET (0.5 MG TOTAL) BY MOUTH 2 (TWO) TIMES DAILY AS NEEDED. (Patient taking differently: Take 0.25-0.5 mg by mouth 2 (two) times daily as needed for anxiety or sleep.)   atorvastatin  (LIPITOR) 80 MG tablet Take 1 tablet (80 mg total) by mouth daily.   azelastine  (ASTELIN ) 0.1 % nasal spray Place 1 spray into both nostrils 2 (two) times daily as needed for allergies.   benzonatate  (TESSALON ) 200 MG capsule TAKE 1 CAPSULE BY MOUTH EVERY 8 HOURS AS NEEDED FOR COUGH   buPROPion ER (WELLBUTRIN SR) 100 MG 12 hr tablet Take 100 mg by mouth daily.   Cholecalciferol (VITAMIN D3) 125 MCG (5000 UT) CAPS Take 1 capsule (5,000 Units total) by mouth daily.   Cholecalciferol 125 MCG (5000 UT) TABS Take 5,000 Units by mouth in the morning.   Continuous Glucose Sensor (FREESTYLE LIBRE 2 SENSOR) MISC Inject 1 kit into the skin every 14 (fourteen) days.   Dextromethorphan-guaiFENesin (MUCINEX COUGH & CHEST CONGEST PO) Take 1 capsule by mouth as needed (Patient taking as needed).   DULoxetine  (CYMBALTA ) 60 MG capsule Take 1 capsule (60 mg total) by mouth daily. (Patient taking differently: Take 120 mg by mouth at bedtime. Take two capsules daily)   empagliflozin (JARDIANCE) 10 MG TABS tablet Take 10 mg by mouth daily.   EQ ALLERGY RELIEF, CETIRIZINE , 10 MG tablet Take 1 tablet by mouth once daily   esomeprazole  (NEXIUM ) 40 MG capsule Take 1 capsule (40 mg total) by mouth 2 (two) times  daily before a meal.   Fezolinetant  (VEOZAH ) 45 MG TABS Take 1 tablet (45 mg total) by mouth daily.   Fluticasone-Umeclidin-Vilant (TRELEGY ELLIPTA) 100-62.5-25 MCG/INH AEPB Inhale 1 puff into the lungs daily.   furosemide  (LASIX ) 20 MG tablet Take 1 tablet (20 mg total) by mouth daily.   glucose 4 GM chewable tablet Chew 1 tablet (4 g total) by mouth as needed for low blood sugar (< 70).    hydrOXYzine  (VISTARIL ) 25 MG capsule Take 1 capsule by mouth three times daily as needed   insulin  aspart (NOVOLOG ) 100 UNIT/ML injection Use sliding scale provided at time of discharge, administer 3 times daily with meals (Patient taking differently: Inject 15-48 Units into the skin See admin instructions. Inject 20-40 units subcutaneously 3 times daily with meals and inject 15-35 units subcutaneously at night if needed with nighttime snacking)   insulin  glargine, 2 Unit Dial, (TOUJEO  MAX SOLOSTAR) 300 UNIT/ML Solostar Pen Inject 80 Units into the skin 2 (two) times daily.   Insulin  Pen Needle (B-D UF III MINI PEN NEEDLES) 31G X 5 MM MISC Use to administer insulin  five times a day   levothyroxine  (SYNTHROID ) 175 MCG tablet TAKE 1 TABLET BY MOUTH ONCE DAILY BEFORE BREAKFAST LABS  REQUIRED  FOR  FUTURE  REFILLS   LINZESS  290 MCG CAPS capsule TAKE 1 CAPSULE BY MOUTH ONCE DAILY BEFORE BREAKFAST   methocarbamol  (ROBAXIN ) 500 MG tablet Take 1 tablet (500 mg total) by mouth every 6 (six) hours as needed for muscle spasms.   MOUNJARO 12.5 MG/0.5ML Pen Inject 12.5 mg into the skin once a week.   naproxen  (NAPROSYN ) 500 MG tablet Take 1 tablet by mouth twice daily   Nerve Stimulator (CLEVER CHOICE TENS UNIT) DEVI Place 1 Dose onto the skin as needed (pain.). Tens 7000   nystatin  (MYCOSTATIN /NYSTOP ) powder APPLY  POWDER TOPICALLY THREE TIMES DAILY   OXYGEN  Inhale 3 L into the lungs continuous.   Polyethyl Glycol-Propyl Glycol (SYSTANE OP) Apply to eye.   primidone  (MYSOLINE ) 50 MG tablet Take 1 tablet (50 mg total) by mouth at bedtime.   Rhubarb (ESTROVEN COMPLETE PO) Take by mouth.   Roflumilast  (ZORYVE ) 0.15 % CREA Apply 1 Application topically daily.   temazepam  (RESTORIL ) 15 MG capsule Take 15 mg by mouth at bedtime.   terbinafine  (LAMISIL ) 250 MG tablet Take 1 tablet by mouth once daily   [DISCONTINUED] Misc Natural Products (COMPLETE MENOPAUSE HEALTH PO) Take by mouth. [place] complete menopause  gummies   No facility-administered encounter medications on file as of 08/23/2024.    Allergies as of 08/23/2024 - Review Complete 08/23/2024  Allergen Reaction Noted   Aspirin  01/10/2019   Hydrocodone-acetaminophen  Hives 11/03/2007   Oxycodone-acetaminophen  Hives 11/03/2007   Penicillins Hives 11/03/2007   Codeine Hives 11/03/2007   Fluoxetine hcl Itching and Other (See Comments) 11/03/2007   Trazodone  Other (See Comments) 02/02/2019    Past Medical History:  Diagnosis Date   Acquired hypothyroidism 10/01/2010   Allergic rhinitis 07/20/2009   Arthritis    AVM (arteriovenous malformation) of colon, acquired with hemorrhage    Back pain 10/18/2015   Bilateral carpal tunnel syndrome 07/20/2023   Bilateral leg edema 03/31/2018   Centrilobular emphysema 02/04/2019   HRCT 09/15/18; 04/09/23   Chronic diastolic CHF (congestive heart failure) 01/28/2019   TTE 05/25/2023 notes mild diastolic dysfunction, grade 1B elevated left ventricular pressure     Chronic respiratory failure with hypoxia 02/04/2019   6 Min Walk 08/31/18 at [facility]  Chronic urticaria 12/21/2019   COPD (chronic obstructive pulmonary disease) 09/01/2018   Quit smoking 2010  - Spirometry 08/31/2018  FEV1 1.1 (51%)  Ratio 66 s prior rx with typical curvature  - 08/31/2018  After extensive coaching inhaler device,  effectiveness =    90% with elipta > try anoro sample > no better so d/c'  - PFT's  11/10/2018  FEV1 1.24 (59 % ) ratio 75 (ratio with fev1/VC =  63)  p no % improvement from saba p nothing prior to study with DLCO  105  % corrects to 132  %    DOE (dyspnea on exertion) 08/31/2018   Onset 05/2018 with background of unexplained leg swelling x 01/2018   -echo 04/13/18 just G1   diastolic dysfunction s PH   -  08/31/2018   Walked RA x one lap @ 185 stopped due to  Sob/ sats 89% at nl pace  - Spirometry 08/31/2018  FEV1 1.1 (51%)  Ratio 66 s prior rx  - 09/28/2018 could not do full pfts 09/28/2018  - Collagen vasc  profile 09/28/18  For ESR =  54 (was 102 before prednisone )  Neg collag   Dyshidrotic eczema 12/13/2010   Epigastric pain 11/03/2007   Esophageal reflux 11/03/2007   Essential hypertension 01/26/2019   Essential tremor    Generalized anxiety disorder with panic attacks 01/20/2018   Headache 11/03/2007   Hiatal hernia 04/10/2023   Noted on chest CT   History of COVID-19 11/21/2021   Hot flashes 10/28/2023   Irritable bowel syndrome 07/13/2009   Major depressive disorder 07/17/2020   Mixed hyperlipidemia 07/20/2009   Obesity    OSA (obstructive sleep apnea) 07/26/2015   HST 08/2015 mild OSA, AHI 8/hour, lowest desaturation 81%, desaturations noted without respiratory events  Formatting of this note might be different from the original. PSG 05/20/19 AHI 25.1   Osteopenia of multiple sites 06/01/2019   DEXA 05/24/2019: Right femur neck T- 2.2, left femur neck T- 1.5, right total femur T -0.5, left total femur T -0.5, AP total spine T- 1.9. FRAX 10-year probability of major osteoporotic fracture is 8.8% and a hip fracture is 1.2%  DEXA 05/05/2019: Right femur neck T- 1.9, left femur neck T- 2.0, right total femur T -1.3, left total femur   Oxygen  deficiency    Polyp of cecum    Polyp of sigmoid colon    Polyp of transverse colon    Positive colorectal cancer screening using Cologuard test    Postinflammatory pulmonary fibrosis 09/15/2018   ESR    10/161/9  = 102   > rx short term prednisone  ? slt improvement in symptoms  HRCT 09/15/2018   1. Very mild basilar subpleural ground-glass, reticulation and traction bronchiolectasis, findings which may be minimally progressive from 06/29/2017 and are indicative of interstitial lung disease such as nonspecific interstitial pneumonitis. Findings are indeterminate for UIP per consensus gu   Psychophysiological insomnia 03/29/2019   Type 2 diabetes mellitus 02/09/2017   UTI (urinary tract infection)    Vertigo 03/29/2014   Vitamin D  deficiency  09/15/2020    Past Surgical History:  Procedure Laterality Date   ANKLE FRACTURE SURGERY Right    benign tumor      removed from groin   COLONOSCOPY WITH PROPOFOL  N/A 05/15/2022   per Dr. Shila, adenomatous polyps, repeat in 5 yrs   EYE SURGERY     HOT HEMOSTASIS N/A 05/15/2022   Procedure: HOT HEMOSTASIS (ARGON PLASMA COAGULATION/BICAP);  Surgeon: Layman Gully V,  MD;  Location: [facility];  Service: Gastroenterology;  Laterality: N/A;   POLYPECTOMY  05/15/2022   Procedure: POLYPECTOMY;  Surgeon: Shila Gustav GAILS, MD;  Location: [facility];  Service: Gastroenterology;;   WRIST ARTHROCENTESIS N/A 2001    Family History  Problem Relation Age of Onset   Arthritis/Rheumatoid Mother    Alzheimer's disease Father    Diabetes Mellitus II Father    Dementia Father    Diabetes Mellitus II Sister    Dementia Brother    Diabetes Mellitus II Brother    Asthma Other        fhx   Depression Other        fhx   Parkinsonism Other        fhx   Lupus Other        fhx    Social History   Socioeconomic History   Marital status: Divorced    Spouse name: Not on file   Number of children: 4   Years of education: 13   Highest education level: Some college, no degree  Occupational History    Comment: retired  Tobacco Use   Smoking status: Former    Current packs/day: 0.00    Average packs/day: 1 pack/day for 35.0 years (35.0 ttl pk-yrs)    Types: Cigarettes    Start date: 12/01/1973    Quit date: 12/01/2008    Years since quitting: 15.7   Smokeless tobacco: Never  Vaping Use   Vaping status: Never Used  Substance and Sexual Activity   Alcohol use: Not Currently   Drug use: No   Sexual activity: Not Currently  Other Topics Concern   Not on file  Social History Narrative   Lives alone.   She has four grown children.   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   Highest level of education:  1.5 years of college      Right Handed   Is on 3  L of oxygen     Social Drivers of Health   Financial Resource Strain: Low Risk  (04/15/2022)   Overall Financial Resource Strain (CARDIA)    Difficulty of Paying Living Expenses: Not hard at all  Food Insecurity: Low Risk  (06/09/2024)   Received from [facility]   Hunger Vital Sign    Within the past 12 months, you worried that your food would run out before you got money to buy more: Never true    Within the past 12 months, the food you bought just didn't last and you didn't have money to get more. : Never true  Transportation Needs: No Transportation Needs (06/09/2024)   Received from [facility]    In the past 12 months, has lack of reliable transportation kept you from medical appointments, meetings, work or from getting things needed for daily living? : No  Physical Activity: Inactive (04/16/2023)   Exercise Vital Sign    Days of Exercise per Week: 0 days    Minutes of Exercise per Session: 0 min  Stress: No Stress Concern Present (04/15/2022)   [employer] of Occupational Health - Occupational Stress Questionnaire    Feeling of Stress : Only a little  Social Connections: Socially Isolated (04/15/2022)   Social Connection and Isolation Panel    Frequency of Communication with Friends and Family: More than three times a week    Frequency of Social Gatherings with Friends and Family: More than three times a week    Attends Religious  Services: Never    Active Member of Clubs or Organizations: No    Attends Banker Meetings: Never    Marital Status: Divorced  Catering manager Violence: Not At Risk (04/15/2022)   Humiliation, Afraid, Rape, and Kick questionnaire    Fear of Current or Ex-Partner: No    Emotionally Abused: No    Physically Abused: No    Sexually Abused: No      Review of systems: All other review of systems negative except as mentioned in the HPI.   Physical Exam: Vitals:   08/23/24 0857  BP: 130/66  Pulse: 74   Body  mass index is 50.39 kg/m. Gen:      No acute distress HEENT:  sclera anicteric CV: s1s2 rrr, no murmur Lungs: B/l clear. Abd:      soft, non-tender; no palpable masses, no distension Ext:    No edema Neuro: alert and oriented x 3 Psych: normal mood and affect  Data Reviewed:  Reviewed labs, radiology imaging, old records and pertinent past GI work up   Assessment & Plan Chronic constipation Chronic constipation with difficulty in bowel movements, requiring straining. Current treatment with Linzess  is partially effective, providing bowel movements every other day. - Provide samples of higher dose Linzess  290 mcg daily for trial - Instruct to take two of the current dose if samples are effective - Monitor for diarrhea; discontinue if experiencing watery diarrhea more than four times a day - Advise to take Linzess  after meals if higher dose is not effective - Encourage increased water intake to at least 64 ounces daily  Chronic obstructive pulmonary disease with emphysema Chronic obstructive pulmonary disease with emphysema, currently managed with supplemental oxygen  at 2 L/min. Decreased air movement and congestion suggest possible exacerbation. - Increase oxygen  to 3 L/min - Monitor symptoms and consult pulmonologist if breathing does not improve  Type 2 diabetes mellitus Type 2 diabetes mellitus under good control with current regimen.  Hyperlipidemia Hyperlipidemia well-managed with Lipitor, contributing to improved liver function tests.  Follow-Up Follow-up plan discussed for ongoing management of chronic conditions and future procedures. - Schedule follow-up appointment in six months - Plan colonoscopy for Jaz[place] 2026 with consideration for hospital setting due to COPD and oxygen  needs - Consider using Miralax and pills for colonoscopy prep     Return in 6 months   This visit required >30 minutes of patient care (this includes precharting, chart review, review of  results, face-to-face time used for counseling as well as treatment plan and follow-up. The patient was provided an opportunity to ask questions and all were answered. The patient agreed with the plan and demonstrated an understanding of the instructions.  LOIS Wilkie Mcgee , MD    CC: Bonnie Gonzalez LABOR, MD

## 2024-08-23 NOTE — Patient Instructions (Addendum)
 VISIT SUMMARY:  [name], a 68 year old female, visited for a follow-up on her chronic constipation treatment. She also has a history of COPD, emphysema, fatty liver, and type 2 diabetes, which are currently well-managed.  YOUR PLAN:  CHRONIC CONSTIPATION: You have chronic constipation with difficulty in bowel movements, requiring straining. Your current treatment with Linzess  is partially effective. -Try samples of a higher dose of Linzess . -If the samples are effective, take two of your current dose. -Monitor for diarrhea and discontinue if you experience watery diarrhea more than four times a day. -Take Linzess  after meals if the higher dose is not effective. -Continue to drink at least 64 ounces of water daily.  CHRONIC OBSTRUCTIVE PULMONARY DISEASE WITH EMPHYSEMA: You have COPD with emphysema, currently managed with supplemental oxygen  at 2 L/min.  -Monitor your symptoms and consult your pulmonologist if your breathing does not improve.  TYPE 2 DIABETES MELLITUS: Your type 2 diabetes is under good control with your current regimen. -Continue your current diabetes management plan.  HYPERLIPIDEMIA: Your hyperlipidemia is well-managed with Lipitor, contributing to improved liver function tests. -Continue taking Lipitor as prescribed.  FOLLOW-UP: We discussed the follow-up plan for your ongoing management and future procedures. -Schedule a follow-up appointment in six months. -Plan for a colonoscopy in [place] 2026, considering a hospital setting due to your COPD and oxygen  needs. -Consider using Miralax and pills for colonoscopy prep.  I appreciate the  opportunity to care for you  Thank You   Kavitha Nandigam , MD

## 2024-08-25 ENCOUNTER — Encounter: Payer: Self-pay | Admitting: Gastroenterology

## 2024-08-29 NOTE — Progress Notes (Signed)
 Bonnie Gonzalez                                          MRN: [id number]   08/29/2024   The [facility]BCI Quality Team Specialist reviewed this patient medical record for the purposes of chart review for care gap closure. The following were reviewed: abstraction for care gap closure-kidney health evaluation for diabetes:eGFR  and uACR.    VBCI Quality Team

## 2024-08-30 ENCOUNTER — Other Ambulatory Visit (INDEPENDENT_AMBULATORY_CARE_PROVIDER_SITE_OTHER): Admitting: Pharmacist

## 2024-08-30 DIAGNOSIS — Z794 Long term (current) use of insulin: Secondary | ICD-10-CM

## 2024-08-30 DIAGNOSIS — E119 Type 2 diabetes mellitus without complications: Secondary | ICD-10-CM

## 2024-08-30 NOTE — Progress Notes (Signed)
 Pharmacy Quality Measure Review  This patient is appearing on a report for being at risk of failing the Glycemic Status Assessment in Diabetes measure this calendar year.    Last documented A1c  7.3 on 08/04/24 at [facility].   No action needed at this time.   Catie IVAR Centers, PharmD, [facility] Clinical Pharmacist [phone]

## 2024-09-10 ENCOUNTER — Other Ambulatory Visit: Payer: Self-pay | Admitting: Family Medicine

## 2024-09-18 DIAGNOSIS — J9611 Chronic respiratory failure with hypoxia: Secondary | ICD-10-CM | POA: Diagnosis not present

## 2024-09-18 DIAGNOSIS — J449 Chronic obstructive pulmonary disease, unspecified: Secondary | ICD-10-CM | POA: Diagnosis not present

## 2024-09-18 DIAGNOSIS — J432 Centrilobular emphysema: Secondary | ICD-10-CM | POA: Diagnosis not present

## 2024-09-19 DIAGNOSIS — F5101 Primary insomnia: Secondary | ICD-10-CM | POA: Diagnosis not present

## 2024-09-19 DIAGNOSIS — F330 Major depressive disorder, recurrent, mild: Secondary | ICD-10-CM | POA: Diagnosis not present

## 2024-09-19 DIAGNOSIS — F411 Generalized anxiety disorder: Secondary | ICD-10-CM | POA: Diagnosis not present

## 2024-09-27 DIAGNOSIS — E1165 Type 2 diabetes mellitus with hyperglycemia: Secondary | ICD-10-CM | POA: Diagnosis not present

## 2024-09-27 DIAGNOSIS — Z794 Long term (current) use of insulin: Secondary | ICD-10-CM | POA: Diagnosis not present

## 2024-10-04 ENCOUNTER — Ambulatory Visit: Payer: Self-pay

## 2024-10-04 NOTE — Telephone Encounter (Signed)
 FYI Only or Action Required?: Action required by provider: request for appointment.  Patient was last seen in primary care on 08/10/2024 by Bonnie Gonzalez LABOR, MD.  Called Nurse Triage reporting Cough.  Symptoms began several days ago.  Interventions attempted: OTC medications: Mucinex.  Symptoms are: gradually worsening.  Triage Disposition: See Physician Within 24 Hours  Patient/caregiver understands and will follow disposition?: Yes   Copied from CRM [id number]. Topic: Clinical - Red Word Triage >> Oct 04, 2024  1:18 PM Frederich PARAS wrote: [facility] that prompted transfer to Nurse Triage: productive cough, head pain  Pt says she thinks she has a respiratory infection, thick mucus, mucinex not working, productive caugh , head pains, chest feels heavy. Reason for Disposition  [1] Continuous (nonstop) coughing interferes with work or school AND [2] no improvement using cough treatment per Care Advice  Answer Assessment - Initial Assessment Questions 1. ONSET: When did the cough begin?      Several Days ago, 4-5 Days  2. SEVERITY: How bad is the cough today?      Intermittent  3. SPUTUM: Describe the color of your sputum (e.g., none, dry cough; clear, white, yellow, green)     Productive, Yellow,Green  4. HEMOPTYSIS: Are you coughing up any blood? If Yes, ask: How much? (e.g., flecks, streaks, tablespoons, etc.)     No  5. DIFFICULTY BREATHING: Are you having difficulty breathing? If Yes, ask: How bad is it? (e.g., mild, moderate, severe)      Mild  6. FEVER: Do you have a fever? If Yes, ask: What is your temperature, how was it measured, and when did it start?     Unsure  7. CARDIAC HISTORY: Do you have any history of heart disease? (e.g., heart attack, congestive heart failure)      CHF  8. LUNG HISTORY: Do you have any history of lung disease?  (e.g., pulmonary embolus, asthma, emphysema)     COPD, Emphysema  9. PE RISK FACTORS: Do you have a history of  blood clots? (or: recent major surgery, recent prolonged travel, bedridden)     :No  10. OTHER SYMPTOMS: Do you have any other symptoms? (e.g., runny nose, wheezing, chest pain)       Congestion, Chest Tightness, Dizziness (Mild)  11. PREGNANCY: Is there any chance you are pregnant? When was your last menstrual period?       No and No  12. TRAVEL: Have you traveled out of the country in the last month? (e.g., travel history, exposures)       Denies  Protocols used: Cough - Acute Productive-A-AH

## 2024-10-05 ENCOUNTER — Encounter: Payer: Self-pay | Admitting: Family Medicine

## 2024-10-05 ENCOUNTER — Ambulatory Visit: Admitting: Family Medicine

## 2024-10-05 VITALS — BP 128/72 | HR 101 | Temp 98.2°F | Wt 256.0 lb

## 2024-10-05 DIAGNOSIS — R059 Cough, unspecified: Secondary | ICD-10-CM

## 2024-10-05 DIAGNOSIS — J440 Chronic obstructive pulmonary disease with acute lower respiratory infection: Secondary | ICD-10-CM | POA: Diagnosis not present

## 2024-10-05 DIAGNOSIS — J209 Acute bronchitis, unspecified: Secondary | ICD-10-CM

## 2024-10-05 LAB — POCT INFLUENZA A/B
Influenza A, POC: NEGATIVE
Influenza B, POC: NEGATIVE

## 2024-10-05 LAB — POC COVID19 BINAXNOW: SARS Coronavirus 2 Ag: NEGATIVE

## 2024-10-05 MED ORDER — DOXYCYCLINE HYCLATE 100 MG PO TABS: 100.0000 mg | ORAL_TABLET | Freq: Two times a day (BID) | ORAL | 0 refills | Status: AC

## 2024-10-05 NOTE — Progress Notes (Signed)
 SABRA

## 2024-10-05 NOTE — Addendum Note (Signed)
 Addended by: LADONNA INOCENTE SAILOR on: 10/05/2024 02:00 PM   Modules accepted: Orders

## 2024-10-05 NOTE — Telephone Encounter (Signed)
 Pt was seen this morning by Dr Johnny for this problem.

## 2024-10-05 NOTE — Progress Notes (Signed)
   Subjective:    Patient ID: Bonnie Gonzalez, female    DOB: 1956/07/07, 68 y.o.   MRN: [id number]  HPI Here for one week of low grade fevers, chest congestion, and coughing up green sputum. No chest pain. No more SOB than usual.    Review of Systems  Constitutional: Negative.   HENT:  Positive for congestion and postnasal drip. Negative for ear pain, sinus pain and sore throat.   Eyes: Negative.   Respiratory:  Positive for cough and wheezing.        Objective:   Physical Exam Constitutional:      General: She is not in acute distress. HENT:     Right Ear: Tympanic membrane, ear canal and external ear normal.     Left Ear: Tympanic membrane, ear canal and external ear normal.     Nose: Nose normal.     Mouth/Throat:     Pharynx: Oropharynx is clear.  Eyes:     Conjunctiva/sclera: Conjunctivae normal.  Pulmonary:     Effort: Pulmonary effort is normal.     Breath sounds: Wheezing and rhonchi present. No rales.  Lymphadenopathy:     Cervical: No cervical adenopathy.  Neurological:     Mental Status: She is alert.           Assessment & Plan:  Bronchitis, treat with 10 days of Doxycycline .  Garnette Olmsted, MD

## 2024-10-07 NOTE — Progress Notes (Addendum)
 Bonnie Gonzalez                                          MRN: [id number]   10/07/2024   The [facility]BCI Quality Team Specialist reviewed this patient medical record for the purposes of chart review for care gap closure. The following were reviewed: abstraction for care gap closure-glycemic status assessment.  12/20/2024- NO NEW GSD SINCE PREVIOUSLY CLOSED.   VBCI Quality Team

## 2024-10-19 DIAGNOSIS — J9611 Chronic respiratory failure with hypoxia: Secondary | ICD-10-CM | POA: Diagnosis not present

## 2024-10-19 DIAGNOSIS — J432 Centrilobular emphysema: Secondary | ICD-10-CM | POA: Diagnosis not present

## 2024-10-22 ENCOUNTER — Other Ambulatory Visit: Payer: Self-pay | Admitting: Family Medicine

## 2024-10-22 DIAGNOSIS — J309 Allergic rhinitis, unspecified: Secondary | ICD-10-CM

## 2024-10-30 ENCOUNTER — Other Ambulatory Visit: Payer: Self-pay | Admitting: Family Medicine

## 2024-11-03 ENCOUNTER — Other Ambulatory Visit: Payer: Self-pay | Admitting: Family Medicine

## 2024-11-04 ENCOUNTER — Other Ambulatory Visit: Payer: Self-pay | Admitting: Gastroenterology

## 2024-11-07 DIAGNOSIS — J432 Centrilobular emphysema: Secondary | ICD-10-CM | POA: Diagnosis not present

## 2024-11-07 DIAGNOSIS — J9611 Chronic respiratory failure with hypoxia: Secondary | ICD-10-CM | POA: Diagnosis not present

## 2024-11-07 DIAGNOSIS — I5189 Other ill-defined heart diseases: Secondary | ICD-10-CM | POA: Diagnosis not present

## 2024-11-07 DIAGNOSIS — I5032 Chronic diastolic (congestive) heart failure: Secondary | ICD-10-CM | POA: Diagnosis not present

## 2024-11-07 DIAGNOSIS — J449 Chronic obstructive pulmonary disease, unspecified: Secondary | ICD-10-CM | POA: Diagnosis not present

## 2024-11-11 NOTE — Progress Notes (Signed)
 Assessment/Plan:   .Assessment and Plan Assessment & Plan Tremor Well-managed with primidone  50 mg at bedtime. COPD medications may contribute but current management is effective. - Continue primidone  50 mg at bedtime. - Sent refill for primidone  for a 90-day supply with refills to the pharmacy on [name].  Memory change Recent neurocognitive testing showed no significant impairment (in August 2022 and [name] 2025). Stress from caregiving for her brother may contribute to perceived memory issues, potentially causing pseudo-dementia. - Encouraged self-care and stress management. - Discussed respite care and caregiver support groups as potential resources.  Major depressive disorder, recurrent, mild Caregiving stress may exacerbate depressive symptoms. - Referred to counseling services   Moderate COPD Following with pulmonary and on Trelegy and albuterol  as needed. -on O2  F/u yearly  Subjective:   Bonnie Gonzalez was seen today in follow up for tremor.  My previous records were reviewed prior to todays visit.  She continues on primidone , 50 mg daily.  She is tolerating it well, without side effects.  She continues to follow-up with pulmonary regarding moderate COPD.  Discussed the use of AI scribe software for clinical note transcription with the patient, who gave verbal consent to proceed.  History of Present Illness Bonnie Keirah Konitzer Ms Zuzanna is a 68 year old female with essential tremor and COPD who presents for a neurology follow-up.  She is currently taking primidone  50 mg once daily at night for her tremors. Tremors are well controlled for the most part.  On O2 for her copd.  Rarely uses her albuterol .    She is experiencing worsening memory, particularly with word recall, which she feels is frequent. Despite good results from neurocognitive testing in [name], her family has noticed her forgetfulness, which concerns her. She attributes some memory issues to  stress, especially from caregiving for her brother, who lives with her after his divorce. She describes the caregiving situation as stressful, noting that her brother's behavior can be childlike and challenging to manage.  She notes the stress of caregiving significantly impacts her mood and interactions with others, leading to sharp and reactive behavior. She has been living alone for over twenty years before her brother moved in, which has been a significant adjustment for her.   PREVIOUS MEDICATIONS: none to date  ALLERGIES:   Allergies  Allergen Reactions   Aspirin     Mother had to be resuscitated after ingesting this   Hydrocodone-Acetaminophen  Hives   Oxycodone-Acetaminophen  Hives   Penicillins Hives    Did it involve swelling of the face/tongue/throat, SOB, or low BP? Yes-hives Did it involve sudden or severe rash/hives, skin peeling, or any reaction on the inside of your mouth or nose? Unk Did you need to seek medical attention at a hospital or doctor's office? Yes When did it last happen? Was in her 30's If all above answers are NO, may proceed with cephalosporin use.  Did it involve swelling of the face/tongue/throat, SOB, or low BP? Yes-hives Did it involve sudden or severe rash/hives, skin peeling, or any reaction on the inside of your mouth or nose? Unk Did you need to seek medical attention at a hospital or doctor's office? Yes When did it last happen? Was in her 30's If all above answers are NO, may proceed with cephalosporin use.   Codeine Hives   Fluoxetine Hcl Itching and Other (See Comments)    Hair itching   Trazodone  Other (See Comments)    Couldn't walk  CURRENT MEDICATIONS:  Outpatient Encounter Medications as of 11/14/2024  Medication Sig   albuterol  (PROVENTIL  HFA;VENTOLIN  HFA) 108 (90 Base) MCG/ACT inhaler Inhale 2 puffs into the lungs every 4 (four) hours as needed for wheezing or shortness of breath.   ALPRAZolam  (XANAX ) 0.5 MG tablet TAKE 1  TABLET (0.5 MG TOTAL) BY MOUTH 2 (TWO) TIMES DAILY AS NEEDED. (Patient taking differently: Take 0.25-0.5 mg by mouth 2 (two) times daily as needed for anxiety or sleep.)   atorvastatin  (LIPITOR) 80 MG tablet Take 1 tablet (80 mg total) by mouth daily.   azelastine  (ASTELIN ) 0.1 % nasal spray Place 1 spray into both nostrils 2 (two) times daily as needed for allergies.   benzonatate  (TESSALON ) 200 MG capsule TAKE 1 CAPSULE BY MOUTH EVERY 8 HOURS AS NEEDED FOR COUGH   buPROPion ER (WELLBUTRIN SR) 100 MG 12 hr tablet Take 100 mg by mouth daily.   Cholecalciferol (VITAMIN D3) 125 MCG (5000 UT) CAPS Take 1 capsule (5,000 Units total) by mouth daily.   Cholecalciferol 125 MCG (5000 UT) TABS Take 5,000 Units by mouth in the morning.   Continuous Glucose Sensor (FREESTYLE LIBRE 2 SENSOR) MISC Inject 1 kit into the skin every 14 (fourteen) days.   Dextromethorphan-guaiFENesin (MUCINEX COUGH & CHEST CONGEST PO) Take 1 capsule by mouth as needed (Patient taking as needed).   doxycycline  (VIBRA -TABS) 100 MG tablet Take 1 tablet (100 mg total) by mouth 2 (two) times daily.   DULoxetine  (CYMBALTA ) 60 MG capsule Take 1 capsule (60 mg total) by mouth daily. (Patient taking differently: Take 120 mg by mouth at bedtime. Take two capsules daily)   empagliflozin (JARDIANCE) 25 MG TABS tablet Take 25 mg by mouth daily.   EQ ALLERGY RELIEF, CETIRIZINE , 10 MG tablet Take 1 tablet by mouth once daily   esomeprazole  (NEXIUM ) 40 MG capsule Take 1 capsule (40 mg total) by mouth 2 (two) times daily before a meal.   Fluticasone-Umeclidin-Vilant (TRELEGY ELLIPTA) 100-62.5-25 MCG/INH AEPB Inhale 1 puff into the lungs daily.   furosemide  (LASIX ) 20 MG tablet Take 1 tablet (20 mg total) by mouth daily.   glucose 4 GM chewable tablet Chew 1 tablet (4 g total) by mouth as needed for low blood sugar (< 70).   hydrOXYzine  (VISTARIL ) 25 MG capsule Take 1 capsule by mouth three times daily as needed   insulin  aspart (NOVOLOG ) 100  UNIT/ML injection Use sliding scale provided at time of discharge, administer 3 times daily with meals (Patient taking differently: Inject 15-48 Units into the skin See admin instructions. Inject 20-40 units subcutaneously 3 times daily with meals and inject 15-35 units subcutaneously at night if needed with nighttime snacking)   insulin  glargine, 2 Unit Dial, (TOUJEO  MAX SOLOSTAR) 300 UNIT/ML Solostar Pen Inject 80 Units into the skin 2 (two) times daily.   Insulin  Pen Needle (B-D UF III MINI PEN NEEDLES) 31G X 5 MM MISC Use to administer insulin  five times a day   levothyroxine  (SYNTHROID ) 175 MCG tablet TAKE 1 TABLET BY MOUTH ONCE DAILY BEFORE BREAKFAST LABS  REQUIRED  FOR  FUTURE  REFILLS   linaclotide  (LINZESS ) 290 MCG CAPS capsule TAKE 1 CAPSULE BY MOUTH ONCE DAILY BEFORE BREAKFAST   methocarbamol  (ROBAXIN ) 500 MG tablet Take 1 tablet (500 mg total) by mouth every 6 (six) hours as needed for muscle spasms.   MOUNJARO 12.5 MG/0.5ML Pen Inject 12.5 mg into the skin once a week.   naproxen  (NAPROSYN ) 500 MG tablet Take 1 tablet by mouth twice  daily   Nerve Stimulator (CLEVER CHOICE TENS UNIT) DEVI Place 1 Dose onto the skin as needed (pain.). Tens 7000   nystatin  (MYCOSTATIN /NYSTOP ) powder APPLY  POWDER TOPICALLY THREE TIMES DAILY   OXYGEN  Inhale 3 L into the lungs continuous.   Polyethyl Glycol-Propyl Glycol (SYSTANE OP) Apply to eye.   primidone  (MYSOLINE ) 50 MG tablet Take 1 tablet (50 mg total) by mouth at bedtime.   Rhubarb (ESTROVEN COMPLETE PO) Take by mouth.   Roflumilast  (ZORYVE ) 0.15 % CREA Apply 1 Application topically daily.   temazepam  (RESTORIL ) 15 MG capsule Take 15 mg by mouth at bedtime.   terbinafine  (LAMISIL ) 250 MG tablet Take 1 tablet by mouth once daily   VEOZAH  45 MG TABS Take 1 tablet by mouth once daily   [DISCONTINUED] EQ ALLERGY RELIEF, CETIRIZINE , 10 MG tablet Take 1 tablet by mouth once daily   No facility-administered encounter medications on file as of  11/14/2024.    Objective:    PHYSICAL EXAMINATION:    VITALS:   Vitals:   11/14/24 1119  BP: (!) 146/80  Pulse: 71  SpO2: 98%  Weight: 253 lb 9.6 oz (115 kg)  Height: 5' (1.524 m)     Cardiovascular: Regular rate rhythm Lungs: Clear to auscultation bilaterally.  Wearing oxygen . Neck: No bruits  Orientation:  The patient is alert and oriented x 3.   Cranial nerves: There is good facial symmetry. Extraocular muscles are intact and visual fields are full to confrontational testing. Speech is fluent and clear. Soft palate rises symmetrically and there is no tongue deviation. Hearing is intact to conversational tone. Tone: Tone is good throughout. Sensation: Sensation is intact to light touch touch throughout Coordination:  The patient has no dysdiadichokinesia or dysmetria. Motor: Strength is at least antigravity x4.    MOVEMENT EXAM: Tremor:  There is no rest tremor.  No postural tremor.  No intention tremor today.  This is stable.  she has no significant difficulty with archimedes spirals.   I have reviewed and interpreted the following labs independently   Chemistry      Component Value Date/Time   NA 132 (L) 04/21/2023 1145   NA 141 05/28/2022 1448   K 4.7 04/21/2023 1145   CL 92 (L) 04/21/2023 1145   CO2 33 (H) 04/21/2023 1145   BUN 15 04/21/2023 1145   BUN 13 05/28/2022 1448   CREATININE 0.62 04/21/2023 1145      Component Value Date/Time   CALCIUM  9.9 04/21/2023 1145   ALKPHOS 112 04/21/2023 1145   AST 21 04/21/2023 1145   ALT 27 04/21/2023 1145   BILITOT 0.3 04/21/2023 1145      Lab Results  Component Value Date   WBC 13.4 (H) 08/10/2024   HGB 12.6 08/10/2024   HCT 40.5 08/10/2024   MCV 80.0 08/10/2024   PLT 267.0 08/10/2024   Lab Results  Component Value Date   TSH 1.92 08/10/2024     Chemistry      Component Value Date/Time   NA 132 (L) 04/21/2023 1145   NA 141 05/28/2022 1448   K 4.7 04/21/2023 1145   CL 92 (L) 04/21/2023 1145   CO2 33  (H) 04/21/2023 1145   BUN 15 04/21/2023 1145   BUN 13 05/28/2022 1448   CREATININE 0.62 04/21/2023 1145      Component Value Date/Time   CALCIUM  9.9 04/21/2023 1145   ALKPHOS 112 04/21/2023 1145   AST 21 04/21/2023 1145   ALT 27 04/21/2023 1145  BILITOT 0.3 04/21/2023 1145         Cc:  Johnny Garnette LABOR, MD

## 2024-11-13 ENCOUNTER — Other Ambulatory Visit: Payer: Self-pay | Admitting: Family Medicine

## 2024-11-14 ENCOUNTER — Ambulatory Visit: Admitting: Neurology

## 2024-11-14 VITALS — BP 146/80 | HR 71 | Ht 60.0 in | Wt 253.6 lb

## 2024-11-14 DIAGNOSIS — J439 Emphysema, unspecified: Secondary | ICD-10-CM | POA: Diagnosis not present

## 2024-11-14 DIAGNOSIS — F330 Major depressive disorder, recurrent, mild: Secondary | ICD-10-CM | POA: Diagnosis not present

## 2024-11-14 DIAGNOSIS — G251 Drug-induced tremor: Secondary | ICD-10-CM

## 2024-11-14 MED ORDER — PRIMIDONE 50 MG PO TABS: 50.0000 mg | ORAL_TABLET | Freq: Every day | ORAL | 3 refills | Status: AC

## 2024-11-14 NOTE — Patient Instructions (Addendum)
° ° °  VISIT SUMMARY: [name], you came in today for a follow-up on your essential tremor and COPD. We also discussed your memory concerns and the stress you are experiencing from caregiving for your brother.  YOUR PLAN: -TREMOR: Your essential tremor is being well-managed with your current medication, primidone  50 mg at bedtime. Essential tremor is a nervous system disorder that causes involuntary and rhythmic shaking. We will continue with the same dosage and have sent a refill for a 90-day supply to your pharmacy.  -MEMORY CHANGE: Your recent neurocognitive testing showed no significant memory impairment. Memory changes can sometimes be due to stress, which may be the case for you given your caregiving responsibilities. We encourage you to practice self-care and stress management. We also discussed the possibility of respite care and caregiver support groups to help you manage stress.  -MAJOR DEPRESSIVE DISORDER, RECURRENT, MILD: Your caregiving stress may be contributing to mild depressive symptoms. Major depressive disorder is a mood disorder that causes persistent feelings of sadness and loss of interest. We have referred you to counseling services at Doctor Fry's office for additional support.  INSTRUCTIONS: Please continue taking your primidone  50 mg at bedtime and pick up your 90-day refill from the pharmacy on [name]. Consider exploring respite care and caregiver support groups to help manage your stress. I sent a referral to counseling and am proud of you for doing this for you!                      Contains text generated by [employer].                                 Contains text generated by [employer].

## 2024-11-22 ENCOUNTER — Other Ambulatory Visit: Payer: Self-pay | Admitting: Family Medicine

## 2024-11-22 DIAGNOSIS — J309 Allergic rhinitis, unspecified: Secondary | ICD-10-CM

## 2024-12-04 ENCOUNTER — Other Ambulatory Visit: Payer: Self-pay | Admitting: Family Medicine

## 2024-12-07 ENCOUNTER — Telehealth: Payer: Self-pay

## 2024-12-07 NOTE — Telephone Encounter (Signed)
 Copied from CRM #[id number]. Topic: Clinical - Medication Question >> Dec 07, 2024  3:54 PM Emylou G wrote: Reason for CRM: Patient called.SABRA adv her ins needs a prior auth for VEOZAH  45 MG TABS

## 2024-12-08 NOTE — Telephone Encounter (Signed)
"   Please submit a PA for pt VEOZAH  45 MG TABS       "

## 2024-12-09 ENCOUNTER — Other Ambulatory Visit (HOSPITAL_COMMUNITY): Payer: Self-pay

## 2024-12-09 ENCOUNTER — Telehealth: Payer: Self-pay

## 2024-12-09 NOTE — Telephone Encounter (Signed)
 Pharmacy Patient Advocate Encounter   Received notification from Physician's Office that prior authorization for Veozah  45MG  tablets is required/requested.   Insurance verification completed.   The patient is insured through [facility] [employer]/RX [employer]VANCE.   Per test claim: PA required; PA submitted to above mentioned insurance via Latent Key/confirmation #/EOC [id number] Status is pending

## 2024-12-09 NOTE — Telephone Encounter (Signed)
 Noted

## 2024-12-15 ENCOUNTER — Other Ambulatory Visit (HOSPITAL_COMMUNITY): Payer: Self-pay

## 2024-12-15 NOTE — Telephone Encounter (Signed)
 Pharmacy Patient Advocate Encounter  Received notification from [facility] [employer]/RX [employer] that Prior Authorization for  Veozah  45MG  tablets  has been APPROVED from 12/12/2024 to 06/10/2025. Unable to obtain price due to refill too soon rejection, last fill date 12/12/2024 next available fill date 01/04/2025   PA #/Case ID/Reference #: [id number]

## 2024-12-25 ENCOUNTER — Other Ambulatory Visit: Payer: Self-pay | Admitting: Family Medicine

## 2024-12-25 DIAGNOSIS — J309 Allergic rhinitis, unspecified: Secondary | ICD-10-CM

## 2025-11-15 ENCOUNTER — Ambulatory Visit: Payer: Self-pay | Admitting: Neurology
